# Patient Record
Sex: Female | Born: 1965 | Race: White | Hispanic: No | Smoking: Former smoker
Health system: Southern US, Community
[De-identification: ages and names within clinical notes are randomized; demographics above are authoritative.]

## PROBLEM LIST (undated history)

## (undated) DIAGNOSIS — K279 Peptic ulcer, site unspecified, unspecified as acute or chronic, without hemorrhage or perforation: Secondary | ICD-10-CM

## (undated) DIAGNOSIS — R0600 Dyspnea, unspecified: Secondary | ICD-10-CM

## (undated) DIAGNOSIS — R51 Headache: Secondary | ICD-10-CM

## (undated) DIAGNOSIS — F329 Major depressive disorder, single episode, unspecified: Secondary | ICD-10-CM

## (undated) DIAGNOSIS — R519 Headache, unspecified: Secondary | ICD-10-CM

## (undated) DIAGNOSIS — I2510 Atherosclerotic heart disease of native coronary artery without angina pectoris: Secondary | ICD-10-CM

## (undated) DIAGNOSIS — D649 Anemia, unspecified: Secondary | ICD-10-CM

## (undated) DIAGNOSIS — F32A Depression, unspecified: Secondary | ICD-10-CM

## (undated) DIAGNOSIS — K284 Chronic or unspecified gastrojejunal ulcer with hemorrhage: Secondary | ICD-10-CM

## (undated) DIAGNOSIS — R569 Unspecified convulsions: Secondary | ICD-10-CM

## (undated) DIAGNOSIS — I219 Acute myocardial infarction, unspecified: Secondary | ICD-10-CM

## (undated) DIAGNOSIS — M797 Fibromyalgia: Secondary | ICD-10-CM

## (undated) DIAGNOSIS — K8020 Calculus of gallbladder without cholecystitis without obstruction: Secondary | ICD-10-CM

## (undated) DIAGNOSIS — K219 Gastro-esophageal reflux disease without esophagitis: Secondary | ICD-10-CM

## (undated) DIAGNOSIS — M1990 Unspecified osteoarthritis, unspecified site: Secondary | ICD-10-CM

## (undated) DIAGNOSIS — F419 Anxiety disorder, unspecified: Secondary | ICD-10-CM

## (undated) HISTORY — PX: ESOPHAGOGASTRODUODENOSCOPY: SHX1529

## (undated) HISTORY — PX: ABDOMINAL HYSTERECTOMY: SHX81

## (undated) HISTORY — DX: Peptic ulcer, site unspecified, unspecified as acute or chronic, without hemorrhage or perforation: K27.9

## (undated) HISTORY — PX: COLONOSCOPY: SHX174

## (undated) HISTORY — PX: WRIST FRACTURE SURGERY: SHX121

## (undated) HISTORY — DX: Chronic or unspecified gastrojejunal ulcer with hemorrhage: K28.4

## (undated) HISTORY — DX: Calculus of gallbladder without cholecystitis without obstruction: K80.20

---

## 2005-05-29 ENCOUNTER — Ambulatory Visit (HOSPITAL_COMMUNITY): Admission: RE | Admit: 2005-05-29 | Discharge: 2005-05-29 | Payer: Self-pay | Admitting: Family Medicine

## 2005-12-10 ENCOUNTER — Ambulatory Visit (HOSPITAL_COMMUNITY): Admission: RE | Admit: 2005-12-10 | Discharge: 2005-12-10 | Payer: Self-pay | Admitting: Family Medicine

## 2006-02-20 ENCOUNTER — Inpatient Hospital Stay (HOSPITAL_COMMUNITY): Admission: RE | Admit: 2006-02-20 | Discharge: 2006-02-22 | Payer: Self-pay | Admitting: Obstetrics & Gynecology

## 2006-02-20 ENCOUNTER — Encounter (INDEPENDENT_AMBULATORY_CARE_PROVIDER_SITE_OTHER): Payer: Self-pay | Admitting: *Deleted

## 2007-12-04 ENCOUNTER — Ambulatory Visit (HOSPITAL_COMMUNITY): Admission: RE | Admit: 2007-12-04 | Discharge: 2007-12-04 | Payer: Self-pay | Admitting: Family Medicine

## 2008-12-22 ENCOUNTER — Emergency Department: Payer: Self-pay

## 2010-04-04 ENCOUNTER — Emergency Department: Payer: Self-pay | Admitting: Emergency Medicine

## 2010-12-15 NOTE — Op Note (Signed)
Carolyn Gaines, Carolyn Gaines                 ACCOUNT NO.:  [id number]   MEDICAL RECORD NO.:  [id number]          PATIENT TYPE:  AMB   LOCATION:  DAY                           FACILITY:  [facility]   PHYSICIAN:  Lazaro Arms, M.D.   DATE OF BIRTH:  1966-01-23   DATE OF PROCEDURE:  02/20/2006  DATE OF DISCHARGE:                                 OPERATIVE REPORT   PREOPERATIVE DIAGNOSES:  1.  Degenerating myoma.  2.  Dyspareunia.  3.  Chronic pelvic pain.   POSTOPERATIVE DIAGNOSES:  1.  Degenerating myoma.  2.  Dyspareunia.  3.  Chronic pelvic pain.  4.  Dense adhesions of the uterus to the anterior abdominal wall and      bladder.   PROCEDURE:  Abdominal hysterectomy.   SURGEON:  Lazaro Arms, M.D.   ANESTHESIA:  General endotracheal anesthesia.   FINDINGS:  The entire anterior of the uterine surface up to the fundus was  densely adherent to the anterior abdominal wall and to the bladder.  This is  probably what made it feel like it was a 12-week size uterus even though on  ultrasound it examined normal.  This is probably also why it was tender on  exam.  Probably not necessarily a degenerating myoma but probably tenderness  from this and also I would assume the reason why she is having pain with  intercourse.  There was no endometriosis or any other abnormal findings.  The ovaries were normal bilaterally.   DESCRIPTION OF PROCEDURE:  Patient was taken to the operating room and  placed in the supine position where she underwent general endotracheal  anesthesia.  The vagina was then prepped.  The Foley catheter was placed.  Abdomen was prepped and draped in the usual sterile fashion.  A Pfannenstiel  skin incision was made and carried down sharply to the rectus fascia which  was scored in the midline and extended laterally.  The fascia was taken off  the muscle superiorly and inferiorly without difficulty.  The muscles were  divided.  The peritoneal cavity was entered.  At this point, I  recognize  that the uterus was densely adherent to the anterior abdominal wall all the  way up to the fundus and really through the myometrium.  Sharply and bluntly  the uterus was dissected off the anterior abdominal wall and then took a  great deal of care to dissect the bladder off as well.  The left uterine  round  ligament was suture ligated and cut and the utero-ovarian ligament on the  left was clamped, cut, and doubly suture ligated ...   Dictation ended at this point.      Lazaro Arms, M.D.  Electronically Signed     LHE/MEDQ  D:  02/20/2006  T:  02/20/2006  Job:  [id number]

## 2010-12-15 NOTE — Discharge Summary (Signed)
NAMEENYLA, LISBON                 ACCOUNT NO.:  [id number]   MEDICAL RECORD NO.:  [id number]          PATIENT TYPE:  INP   LOCATION:  A427                          FACILITY:  [facility]   PHYSICIAN:  Lazaro Arms, M.D.   DATE OF BIRTH:  [birth year]   DATE OF ADMISSION:  02/20/2006  DATE OF DISCHARGE:  07/27/2007LH                                 DISCHARGE SUMMARY   DISCHARGE DIAGNOSES:  1.  Status post abdominal hysterectomy.  2.  Unremarkable postoperative course.   PROCEDURE:  Abdominal hysterectomy.   Please refer to the operative report as well as the admission history and  physical for details of the admission to the hospital.   HOSPITAL COURSE:  The patient was admitted postoperatively. She did quite  well, she tolerated clear liquids and a regular diet. She voided without  symptoms. Her hemoglobin and hematocrit postop was 11.3 and 33.4. She  tolerated transition to oral pain medicine. Her incision was clean, dry and  intact. She was extensively ambulatory. She was discharged to home the  morning of postoperative day #2 in good stable condition to followup in the  office next Wednesday as scheduled to have her staples removed. She was  given Tylox and Motrin for pain and instruction precautions for return prior  to that time.      Lazaro Arms, M.D.  Electronically Signed     LHE/MEDQ  D:  02/22/2006  T:  02/22/2006  Job:  [id number]

## 2016-01-12 DIAGNOSIS — Z87891 Personal history of nicotine dependence: Secondary | ICD-10-CM | POA: Diagnosis not present

## 2016-01-12 DIAGNOSIS — N3000 Acute cystitis without hematuria: Secondary | ICD-10-CM | POA: Diagnosis not present

## 2016-01-28 DIAGNOSIS — I219 Acute myocardial infarction, unspecified: Secondary | ICD-10-CM

## 2016-01-28 HISTORY — DX: Acute myocardial infarction, unspecified: I21.9

## 2016-02-19 ENCOUNTER — Inpatient Hospital Stay (HOSPITAL_COMMUNITY)
Admission: EM | Admit: 2016-02-19 | Discharge: 2016-02-21 | DRG: 247 | Disposition: A | Discharge status: Home / Self Care | Payer: BLUE CROSS/BLUE SHIELD | Attending: Cardiology | Admitting: Cardiology

## 2016-02-19 ENCOUNTER — Encounter (HOSPITAL_COMMUNITY)
Admission: EM | Disposition: A | Discharge status: Home / Self Care | Payer: Self-pay | Source: Home / Self Care | Attending: Cardiology

## 2016-02-19 ENCOUNTER — Encounter (HOSPITAL_COMMUNITY): Payer: Self-pay | Admitting: Emergency Medicine

## 2016-02-19 DIAGNOSIS — M797 Fibromyalgia: Secondary | ICD-10-CM | POA: Diagnosis present

## 2016-02-19 DIAGNOSIS — R001 Bradycardia, unspecified: Secondary | ICD-10-CM | POA: Diagnosis not present

## 2016-02-19 DIAGNOSIS — I213 ST elevation (STEMI) myocardial infarction of unspecified site: Secondary | ICD-10-CM

## 2016-02-19 DIAGNOSIS — F419 Anxiety disorder, unspecified: Secondary | ICD-10-CM | POA: Diagnosis present

## 2016-02-19 DIAGNOSIS — F17200 Nicotine dependence, unspecified, uncomplicated: Secondary | ICD-10-CM | POA: Diagnosis present

## 2016-02-19 DIAGNOSIS — Z955 Presence of coronary angioplasty implant and graft: Secondary | ICD-10-CM

## 2016-02-19 DIAGNOSIS — R200 Anesthesia of skin: Secondary | ICD-10-CM | POA: Diagnosis not present

## 2016-02-19 DIAGNOSIS — Z79899 Other long term (current) drug therapy: Secondary | ICD-10-CM | POA: Diagnosis not present

## 2016-02-19 DIAGNOSIS — Z91048 Other nonmedicinal substance allergy status: Secondary | ICD-10-CM

## 2016-02-19 DIAGNOSIS — R079 Chest pain, unspecified: Secondary | ICD-10-CM | POA: Diagnosis not present

## 2016-02-19 DIAGNOSIS — Z888 Allergy status to other drugs, medicaments and biological substances status: Secondary | ICD-10-CM | POA: Diagnosis not present

## 2016-02-19 DIAGNOSIS — E785 Hyperlipidemia, unspecified: Secondary | ICD-10-CM | POA: Diagnosis present

## 2016-02-19 DIAGNOSIS — I2119 ST elevation (STEMI) myocardial infarction involving other coronary artery of inferior wall: Secondary | ICD-10-CM | POA: Diagnosis not present

## 2016-02-19 DIAGNOSIS — I959 Hypotension, unspecified: Secondary | ICD-10-CM | POA: Diagnosis not present

## 2016-02-19 DIAGNOSIS — I2510 Atherosclerotic heart disease of native coronary artery without angina pectoris: Secondary | ICD-10-CM | POA: Clinically undetermined

## 2016-02-19 DIAGNOSIS — I252 Old myocardial infarction: Secondary | ICD-10-CM | POA: Diagnosis present

## 2016-02-19 DIAGNOSIS — I2111 ST elevation (STEMI) myocardial infarction involving right coronary artery: Principal | ICD-10-CM | POA: Diagnosis present

## 2016-02-19 DIAGNOSIS — Z91030 Bee allergy status: Secondary | ICD-10-CM | POA: Diagnosis not present

## 2016-02-19 HISTORY — PX: CARDIAC CATHETERIZATION: SHX172

## 2016-02-19 HISTORY — DX: Unspecified osteoarthritis, unspecified site: M19.90

## 2016-02-19 HISTORY — DX: Fibromyalgia: M79.7

## 2016-02-19 HISTORY — DX: Anxiety disorder, unspecified: F41.9

## 2016-02-19 LAB — COMPREHENSIVE METABOLIC PANEL
ALBUMIN: 3.3 g/dL — AB (ref 3.5–5.0)
ALK PHOS: 69 U/L (ref 38–126)
ALT: 14 U/L (ref 14–54)
AST: 24 U/L (ref 15–41)
Anion gap: 6 (ref 5–15)
BILIRUBIN TOTAL: 0.3 mg/dL (ref 0.3–1.2)
BUN: 12 mg/dL (ref 6–20)
CALCIUM: 8.2 mg/dL — AB (ref 8.9–10.3)
CO2: 21 mmol/L — ABNORMAL LOW (ref 22–32)
Chloride: 109 mmol/L (ref 101–111)
Creatinine, Ser: 0.8 mg/dL (ref 0.44–1.00)
GFR calc Af Amer: >60 mL/min (ref >60)
GFR calc non Af Amer: >60 mL/min (ref >60)
Glucose, Bld: 129 mg/dL — ABNORMAL HIGH (ref 65–99)
Potassium: 3 mmol/L — ABNORMAL LOW (ref 3.5–5.1)
Sodium: 136 mmol/L (ref 135–145)
TOTAL PROTEIN: 6.5 g/dL (ref 6.5–8.1)

## 2016-02-19 LAB — DIFFERENTIAL
Basophils Absolute: 0 10*3/uL (ref 0.0–0.1)
Basophils Relative: 0 %
Eosinophils Absolute: 0.2 10*3/uL (ref 0.0–0.7)
Eosinophils Relative: 2 %
LYMPHS PCT: 23 %
Lymphs Abs: 1.7 10*3/uL (ref 0.7–4.0)
MONO ABS: 0.5 10*3/uL (ref 0.1–1.0)
Monocytes Relative: 7 %
Neutro Abs: 5 10*3/uL (ref 1.7–7.7)
Neutrophils Relative %: 68 %

## 2016-02-19 LAB — CBC
HCT: 38.6 % (ref 36.0–46.0)
Hemoglobin: 12.8 g/dL (ref 12.0–15.0)
MCH: 29.7 pg (ref 26.0–34.0)
MCHC: 33.2 g/dL (ref 30.0–36.0)
MCV: 89.6 fL (ref 78.0–100.0)
Platelets: 182 10*3/uL (ref 150–400)
RBC: 4.31 MIL/uL (ref 3.87–5.11)
RDW: 13.2 % (ref 11.5–15.5)
WBC: 7.3 10*3/uL (ref 4.0–10.5)

## 2016-02-19 LAB — POCT I-STAT, CHEM 8
BUN: 15 mg/dL (ref 6–20)
BUN: 12 mg/dL (ref 6–20)
CHLORIDE: 111 mmol/L (ref 101–111)
Calcium, Ion: 0.99 mmol/L — ABNORMAL LOW (ref 1.13–1.30)
Calcium, Ion: 1.11 mmol/L — ABNORMAL LOW (ref 1.13–1.30)
Chloride: 117 mmol/L — ABNORMAL HIGH (ref 101–111)
Creatinine, Ser: 0.8 mg/dL (ref 0.44–1.00)
Creatinine, Ser: 0.7 mg/dL (ref 0.44–1.00)
GLUCOSE: 122 mg/dL — AB (ref 65–99)
Glucose, Bld: 125 mg/dL — ABNORMAL HIGH (ref 65–99)
HEMATOCRIT: 39 % (ref 36.0–46.0)
HEMATOCRIT: 32 % — AB (ref 36.0–46.0)
HEMOGLOBIN: 13.3 g/dL (ref 12.0–15.0)
HEMOGLOBIN: 10.9 g/dL — AB (ref 12.0–15.0)
POTASSIUM: 3.9 mmol/L (ref 3.5–5.1)
POTASSIUM: 3.1 mmol/L — AB (ref 3.5–5.1)
SODIUM: 138 mmol/L (ref 135–145)
Sodium: 143 mmol/L (ref 135–145)
TCO2: 20 mmol/L (ref 0–100)
TCO2: 25 mmol/L (ref 0–100)

## 2016-02-19 LAB — LIPID PANEL
Cholesterol: 162 mg/dL (ref 0–200)
HDL: 33 mg/dL — ABNORMAL LOW (ref >40)
LDL Cholesterol: 115 mg/dL — ABNORMAL HIGH (ref 0–99)
Total CHOL/HDL Ratio: 4.9 RATIO
Triglycerides: 71 mg/dL (ref <150)
VLDL: 14 mg/dL (ref 0–40)

## 2016-02-19 LAB — TROPONIN I: Troponin I: 0.12 ng/mL (ref <0.03)

## 2016-02-19 LAB — PROTIME-INR
INR: 1.35 (ref 0.00–1.49)
Prothrombin Time: 16.8 seconds — ABNORMAL HIGH (ref 11.6–15.2)

## 2016-02-19 LAB — POCT I-STAT TROPONIN I: TROPONIN I, POC: 0.03 ng/mL (ref 0.00–0.08)

## 2016-02-19 LAB — APTT: aPTT: >200 seconds (ref 24–37)

## 2016-02-19 SURGERY — LEFT HEART CATH AND CORONARY ANGIOGRAPHY
Anesthesia: LOCAL

## 2016-02-19 MED ORDER — TICAGRELOR 90 MG PO TABS
ORAL_TABLET | ORAL | Status: AC
  Filled 2016-02-19: qty 2

## 2016-02-19 MED ORDER — TRAZODONE HCL 50 MG PO TABS
150.0000 mg | ORAL_TABLET | Freq: Every day | ORAL | Status: DC
  Administered 2016-02-19 – 2016-02-20 (×2): 150 mg via ORAL
  Filled 2016-02-19: qty 1
  Filled 2016-02-19: qty 3

## 2016-02-19 MED ORDER — SODIUM CHLORIDE 0.9 % IV SOLN
INTRAVENOUS | Status: AC
  Administered 2016-02-19: 21:00:00 via INTRAVENOUS

## 2016-02-19 MED ORDER — SODIUM CHLORIDE 0.9% FLUSH
3.0000 mL | Freq: Two times a day (BID) | INTRAVENOUS | Status: DC
  Administered 2016-02-20 (×3): 3 mL via INTRAVENOUS

## 2016-02-19 MED ORDER — LIDOCAINE HCL (PF) 1 % IJ SOLN
INTRAMUSCULAR | Status: DC | PRN
  Administered 2016-02-19: 2 mL via INTRADERMAL

## 2016-02-19 MED ORDER — IOPAMIDOL (ISOVUE-370) INJECTION 76%
INTRAVENOUS | Status: AC
  Filled 2016-02-19: qty 125

## 2016-02-19 MED ORDER — DIAZEPAM 5 MG PO TABS
5.0000 mg | ORAL_TABLET | ORAL | Status: DC | PRN
  Administered 2016-02-19: 5 mg via ORAL
  Filled 2016-02-19: qty 1

## 2016-02-19 MED ORDER — ATORVASTATIN CALCIUM 80 MG PO TABS
80.0000 mg | ORAL_TABLET | Freq: Every day | ORAL | Status: DC
Start: 2016-02-20 — End: 2016-02-21
  Administered 2016-02-20: 80 mg via ORAL
  Filled 2016-02-19: qty 1

## 2016-02-19 MED ORDER — METOPROLOL TARTRATE 12.5 MG HALF TABLET
12.5000 mg | ORAL_TABLET | Freq: Two times a day (BID) | ORAL | Status: DC
  Administered 2016-02-19: 12.5 mg via ORAL
  Filled 2016-02-19: qty 1

## 2016-02-19 MED ORDER — LIDOCAINE HCL (PF) 1 % IJ SOLN
INTRAMUSCULAR | Status: AC
  Filled 2016-02-19: qty 30

## 2016-02-19 MED ORDER — ALPRAZOLAM 0.5 MG PO TABS
1.0000 mg | ORAL_TABLET | Freq: Two times a day (BID) | ORAL | Status: DC | PRN
  Administered 2016-02-20 – 2016-02-21 (×2): 1 mg via ORAL
  Filled 2016-02-19 (×2): qty 2

## 2016-02-19 MED ORDER — TRAMADOL HCL 50 MG PO TABS
100.0000 mg | ORAL_TABLET | Freq: Four times a day (QID) | ORAL | Status: DC | PRN
  Administered 2016-02-19 – 2016-02-21 (×5): 100 mg via ORAL
  Filled 2016-02-19 (×5): qty 2

## 2016-02-19 MED ORDER — HEPARIN SODIUM (PORCINE) 5000 UNIT/ML IJ SOLN
INTRAMUSCULAR | Status: AC
  Administered 2016-02-19: 4000 [IU]
  Filled 2016-02-19: qty 1

## 2016-02-19 MED ORDER — IOPAMIDOL (ISOVUE-370) INJECTION 76%
INTRAVENOUS | Status: DC | PRN
  Administered 2016-02-19: 110 mL via INTRAVENOUS

## 2016-02-19 MED ORDER — SODIUM CHLORIDE 0.9 % IV BOLUS (SEPSIS)
500.0000 mL | Freq: Once | INTRAVENOUS | Status: AC
  Administered 2016-02-19: 500 mL via INTRAVENOUS

## 2016-02-19 MED ORDER — HYDROMORPHONE HCL 1 MG/ML IJ SOLN
INTRAMUSCULAR | Status: AC
  Filled 2016-02-19: qty 1

## 2016-02-19 MED ORDER — FENTANYL CITRATE (PF) 100 MCG/2ML IJ SOLN: 50.0000 ug | Freq: Once | INTRAMUSCULAR | Status: DC

## 2016-02-19 MED ORDER — SODIUM CHLORIDE 0.9% FLUSH: 3.0000 mL | INTRAVENOUS | Status: DC | PRN

## 2016-02-19 MED ORDER — NITROGLYCERIN 1 MG/10 ML FOR IR/CATH LAB
INTRA_ARTERIAL | Status: DC | PRN
  Administered 2016-02-19: 200 ug via INTRACORONARY

## 2016-02-19 MED ORDER — HEPARIN (PORCINE) IN NACL 2-0.9 UNIT/ML-% IJ SOLN
INTRAMUSCULAR | Status: AC
  Filled 2016-02-19: qty 1500

## 2016-02-19 MED ORDER — NITROGLYCERIN 1 MG/10 ML FOR IR/CATH LAB
INTRA_ARTERIAL | Status: AC
  Filled 2016-02-19: qty 10

## 2016-02-19 MED ORDER — ACETAMINOPHEN 325 MG PO TABS
650.0000 mg | ORAL_TABLET | ORAL | Status: DC | PRN
  Administered 2016-02-20 (×2): 650 mg via ORAL
  Filled 2016-02-19 (×2): qty 2

## 2016-02-19 MED ORDER — HEPARIN SODIUM (PORCINE) 1000 UNIT/ML IJ SOLN
INTRAMUSCULAR | Status: AC
  Filled 2016-02-19: qty 1

## 2016-02-19 MED ORDER — VERAPAMIL HCL 2.5 MG/ML IV SOLN
INTRA_ARTERIAL | Status: DC | PRN
  Administered 2016-02-19: 19:00:00 via INTRA_ARTERIAL

## 2016-02-19 MED ORDER — MIDAZOLAM HCL 2 MG/2ML IJ SOLN
INTRAMUSCULAR | Status: DC | PRN
  Administered 2016-02-19: 2 mg via INTRAVENOUS

## 2016-02-19 MED ORDER — ASPIRIN EC 81 MG PO TBEC
81.0000 mg | DELAYED_RELEASE_TABLET | Freq: Every day | ORAL | Status: DC
  Administered 2016-02-20 – 2016-02-21 (×2): 81 mg via ORAL
  Filled 2016-02-19 (×2): qty 1

## 2016-02-19 MED ORDER — MIDAZOLAM HCL 2 MG/2ML IJ SOLN
INTRAMUSCULAR | Status: AC
  Filled 2016-02-19: qty 2

## 2016-02-19 MED ORDER — SODIUM CHLORIDE 0.9 % IV SOLN: 250.0000 mL | INTRAVENOUS | Status: DC | PRN

## 2016-02-19 MED ORDER — VERAPAMIL HCL 2.5 MG/ML IV SOLN
INTRAVENOUS | Status: AC
  Filled 2016-02-19: qty 2

## 2016-02-19 MED ORDER — TICAGRELOR 90 MG PO TABS
90.0000 mg | ORAL_TABLET | Freq: Two times a day (BID) | ORAL | Status: DC
  Administered 2016-02-19 – 2016-02-21 (×4): 90 mg via ORAL
  Filled 2016-02-19 (×4): qty 1

## 2016-02-19 MED ORDER — HEPARIN SODIUM (PORCINE) 1000 UNIT/ML IJ SOLN
INTRAMUSCULAR | Status: DC | PRN
  Administered 2016-02-19: 8000 [IU] via INTRAVENOUS
  Administered 2016-02-19: 1500 [IU] via INTRAVENOUS

## 2016-02-19 MED ORDER — SODIUM CHLORIDE 0.9 % IV SOLN
10.0000 mL/h | INTRAVENOUS | Status: DC
  Administered 2016-02-19: 20 mL/h via INTRAVENOUS
  Administered 2016-02-19: 250 mL via INTRAVENOUS

## 2016-02-19 MED ORDER — ONDANSETRON HCL 4 MG/2ML IJ SOLN: 4.0000 mg | Freq: Four times a day (QID) | INTRAMUSCULAR | Status: DC | PRN

## 2016-02-19 MED ORDER — HEPARIN SODIUM (PORCINE) 5000 UNIT/ML IJ SOLN: 60.0000 [IU]/kg | INTRAMUSCULAR | Status: DC

## 2016-02-19 MED ORDER — NITROGLYCERIN 0.4 MG SL SUBL: 0.4000 mg | SUBLINGUAL_TABLET | SUBLINGUAL | Status: DC | PRN

## 2016-02-19 MED ORDER — FENTANYL CITRATE (PF) 100 MCG/2ML IJ SOLN: INTRAMUSCULAR | Status: DC | PRN

## 2016-02-19 MED ORDER — HYDROMORPHONE HCL 1 MG/ML IJ SOLN
INTRAMUSCULAR | Status: DC | PRN
  Administered 2016-02-19: 0.5 mg via INTRAVENOUS

## 2016-02-19 SURGICAL SUPPLY — 14 items
BALLN TREK RX 2.5X12 (BALLOONS) ×2
BALLOON TREK RX 2.5X12 (BALLOONS) IMPLANT
CATH OPTITORQUE TIG 4.0 5F (CATHETERS) ×1 IMPLANT
CATH VISTA GUIDE 6FR JR4 (CATHETERS) ×1 IMPLANT
DEVICE RAD COMP TR BAND LRG (VASCULAR PRODUCTS) ×1 IMPLANT
GLIDESHEATH SLEND A-KIT 6F 20G (SHEATH) ×1 IMPLANT
KIT ENCORE 26 ADVANTAGE (KITS) ×2 IMPLANT
KIT HEART LEFT (KITS) ×2 IMPLANT
PACK CARDIAC CATHETERIZATION (CUSTOM PROCEDURE TRAY) ×2 IMPLANT
STENT RESOLUTE INTEG 3.0X30 (Permanent Stent) ×1 IMPLANT
TRANSDUCER W/STOPCOCK (MISCELLANEOUS) ×2 IMPLANT
TUBING CIL FLEX 10 FLL-RA (TUBING) ×2 IMPLANT
WIRE ASAHI PROWATER 180CM (WIRE) ×1 IMPLANT
WIRE SAFE-T 1.5MM-J .035X260CM (WIRE) ×2 IMPLANT

## 2016-02-19 NOTE — ED Triage Notes (Addendum)
Report from [facility]> pt was found lying in bed with substernal chest pain since 5pm.  States pain radiated to L arm.  Denies sob, nausea, and vomiting.  CODE STEMI activated pta.  Dr. Jodi Mourning at bedside on pt arrival.

## 2016-02-19 NOTE — ED Provider Notes (Signed)
[facility]-EMERGENCY DEPT Provider Note   CSN: [id number] Arrival date & time: 02/19/16  1857  First Provider Contact:  First MD Initiated Contact with Patient 02/19/16 1901        History   Chief Complaint Chief Complaint  Patient presents with  . Code STEMI    HPI Carolyn Gaines is a 50 y.o. female.  The history is provided by the patient and the EMS personnel.  Chest Pain   This is a new problem. The current episode started less than 1 hour ago. The problem occurs constantly. The problem has not changed since onset.The pain is associated with exertion. The pain is present in the substernal region. The pain is at a severity of 7/10. The pain is severe. The quality of the pain is described as pressure-like. The symptoms are aggravated by exertion. Associated symptoms include diaphoresis, exertional chest pressure, malaise/fatigue, nausea, palpitations, shortness of breath and weakness. Pertinent negatives include no abdominal pain, no back pain, no cough, no dizziness, no fever, no headaches, no hemoptysis, no irregular heartbeat, no lower extremity edema, no near-syncope, no syncope and no vomiting. She has tried nothing for the symptoms. The treatment provided mild relief. Risk factors include stress and smoking/tobacco exposure.    Past Medical History:  Diagnosis Date  . Anxiety   . Arthritis   . Fibromyalgia     Patient Active Problem List   Diagnosis Date Noted  . Acute MI, inferior wall, initial episode of care (HCC) 02/19/2016  . CAD (coronary artery disease), native coronary artery 02/19/2016    Past Surgical History:  Procedure Laterality Date  . ABDOMINAL HYSTERECTOMY      OB History    No data available       Home Medications    Prior to Admission medications   Medication Sig Start Date End Date Taking? Authorizing Provider  aspirin-acetaminophen-caffeine (EXCEDRIN MIGRAINE) [phone] MG tablet Take 2 tablets by mouth 3 (three) times daily as needed for  migraine.   Yes Historical Provider, MD  fluocinonide-emollient (LIDEX-E) 0.05 % cream Apply 1 application topically 2 (two) times daily as needed (psoriasis).   Yes Historical Provider, MD  traMADol (ULTRAM) 50 MG tablet Take 100-150 mg by mouth every 4 (four) hours as needed (pain).   Yes Historical Provider, MD  traZODone (DESYREL) 150 MG tablet Take 150 mg by mouth at bedtime.   Yes Historical Provider, MD    Family History No family history on file.  Social History Social History  Substance Use Topics  . Smoking status: Current Some Day Smoker  . Smokeless tobacco: Never Used  . Alcohol use No     Allergies   Adhesive [tape]; Bee venom; and Triple antibiotic [bacitracin-neomycin-polymyxin]   Review of Systems Review of Systems  Constitutional: Positive for diaphoresis and malaise/fatigue. Negative for fever.  HENT: Negative for congestion.   Eyes: Negative for visual disturbance.  Respiratory: Positive for shortness of breath. Negative for cough and hemoptysis.   Cardiovascular: Positive for chest pain and palpitations. Negative for syncope and near-syncope.  Gastrointestinal: Positive for nausea. Negative for abdominal pain and vomiting.  Genitourinary: Negative for difficulty urinating and dysuria.  Musculoskeletal: Negative for back pain.  Skin: Negative for rash and wound.  Neurological: Positive for weakness. Negative for dizziness and headaches.  Psychiatric/Behavioral: Negative for agitation and confusion.     Physical Exam Updated Vital Signs BP (!) 81/49   Pulse (!) 58   Temp 98.4 F (36.9 C) (Oral)   Resp 16  Ht 5\' 4"  (1.626 m)   Wt 79.6 kg   SpO2 96%   BMI 30.12 kg/m   Physical Exam  Constitutional: She appears well-developed and well-nourished. No distress.  HENT:  Head: Normocephalic and atraumatic.  Eyes: Conjunctivae are normal.  Neck: Neck supple.  Cardiovascular: Normal rate and regular rhythm.   No murmur heard. Pulmonary/Chest:  Effort normal and breath sounds normal. No respiratory distress.  Abdominal: Soft. There is no tenderness.  Musculoskeletal: She exhibits no edema.  Neurological: She is alert.  Skin: Skin is warm and dry. She is not diaphoretic.  Psychiatric: She has a normal mood and affect. Her behavior is normal.  Nursing note and vitals reviewed.    ED Treatments / Results  Labs (all labs ordered are listed, but only abnormal results are displayed) Labs Reviewed  PROTIME-INR - Abnormal; Notable for the following:       Result Value   Prothrombin Time 16.8 (*)    All other components within normal limits  APTT - Abnormal; Notable for the following:    aPTT >200 (*)    All other components within normal limits  COMPREHENSIVE METABOLIC PANEL - Abnormal; Notable for the following:    Potassium 3.0 (*)    CO2 21 (*)    Glucose, Bld 129 (*)    Calcium 8.2 (*)    Albumin 3.3 (*)    All other components within normal limits  TROPONIN I - Abnormal; Notable for the following:    Troponin I 0.12 (*)    All other components within normal limits  LIPID PANEL - Abnormal; Notable for the following:    HDL 33 (*)    LDL Cholesterol 115 (*)    All other components within normal limits  POCT I-STAT, CHEM 8 - Abnormal; Notable for the following:    Glucose, Bld 122 (*)    Calcium, Ion 0.99 (*)    All other components within normal limits  POCT I-STAT, CHEM 8 - Abnormal; Notable for the following:    Potassium 3.1 (*)    Chloride 117 (*)    Glucose, Bld 125 (*)    Calcium, Ion 1.11 (*)    Hemoglobin 10.9 (*)    HCT 32.0 (*)    All other components within normal limits  MRSA PCR SCREENING  CBC  DIFFERENTIAL  BASIC METABOLIC PANEL  CBC  TROPONIN I  TROPONIN I  TSH  TROPONIN I  TROPONIN I  POCT I-STAT TROPONIN I    EKG  EKG Interpretation  Date/Time:  Sunday February 19 2016 19:04:28 EDT Ventricular Rate:  59 PR Interval:    QRS Duration: 62 QT Interval:  378 QTC Calculation: 375 R  Axis:   80 Text Interpretation:  Incomplete analysis due to missing data in precordial lead(s) Sinus rhythm Inferior infarct, acute (RCA) Probably posterior infarct, acute Probable RV involvement, suggest recording right precordial leads Acute Confirmed by Jacinto Halim  MD, JAGADEESH ([id number]) on 02/19/2016 8:07:47 PM       Radiology No results found.  Procedures Procedures (including critical care time)  Medications Ordered in ED Medications  0.9 %  sodium chloride infusion (250 mLs Intravenous Bolus from Bag 02/19/16 1943)  fentaNYL (SUBLIMAZE) injection 50 mcg ( Intravenous MAR Unhold 02/19/16 2027)  sodium chloride flush (NS) 0.9 % injection 3 mL (3 mLs Intravenous Given 02/20/16 0030)  sodium chloride flush (NS) 0.9 % injection 3 mL (not administered)  0.9 %  sodium chloride infusion (not administered)  acetaminophen (TYLENOL) tablet  650 mg (not administered)  ondansetron (ZOFRAN) injection 4 mg (not administered)  aspirin EC tablet 81 mg (not administered)  nitroGLYCERIN (NITROSTAT) SL tablet 0.4 mg (not administered)  0.9 %  sodium chloride infusion ( Intravenous New Bag/Given 02/19/16 2100)  diazepam (VALIUM) tablet 5 mg (5 mg Oral Given 02/19/16 2122)  ticagrelor (BRILINTA) tablet 90 mg (90 mg Oral Given 02/19/16 2122)  ALPRAZolam (XANAX) tablet 1 mg (not administered)  atorvastatin (LIPITOR) tablet 80 mg (not administered)  traZODone (DESYREL) tablet 150 mg (150 mg Oral Given 02/19/16 2148)  traMADol (ULTRAM) tablet 100 mg (100 mg Oral Given 02/19/16 2148)  heparin 5000 UNIT/ML injection (4,000 Units  Given 02/19/16 1903)  sodium chloride 0.9 % bolus 500 mL (500 mLs Intravenous Given 02/19/16 2245)     Initial Impression / Assessment and Plan / ED Course  I have reviewed the triage vital signs and the nursing notes.  Pertinent labs & imaging results that were available during my care of the patient were reviewed by me and considered in my medical decision making (see chart for  details).  Clinical Course   Patient presented to the emergency department with acute inferior MI. En route she was given one nitroglycerin prior to discovery of the inferior MI. She took a full dose aspirin at home at the direction of 911 triage. EMS EKG demonstrates ST elevation in leads 2, 3, and aVF with reciprocal anterior changes. EKG in the emergency department demonstrates resolution of ST elevation. She was started on normal saline and on arrival her pain had improved. Vitals remained within normal limits. She was given a heparin bolus and was taken to the cath lab emergently from the emergency department. Final Clinical Impressions(s) / ED Diagnoses   Final diagnoses:  None    New Prescriptions Current Discharge Medication List       Levora Angel, MD 02/20/16 0225    Blane Ohara, MD 02/28/16 1610

## 2016-02-19 NOTE — ED Notes (Signed)
Transported to cath lab with Lawson Fiscal, RN and Darrel, EMT

## 2016-02-19 NOTE — H&P (Signed)
Carolyn Gaines is an 50 y.o. female.   Chief Complaint: Chest pain HPI: Carolyn Gaines  is a 50 y.o. female  With h/o hyperlipidemia and fibromyalgia and smokes occasionally admitted with chest pain that started this evening, waxing and waning, felt very uncomfortable, as she was not feeling better, activated the CMS, was found to have ST segment elevation in the inferior leads. STEMI was activated and patient directly transferred to [facility] for evaluation.  Patient continues to have chest discomfort, describes as 7-8 out of 10 in intensity associated with radiation to her. She feels clammy, markedly short of breath. No palpitations, no leg edema, no recent travel.  Past Medical History:  Diagnosis Date  . Anxiety   . Arthritis   . Fibromyalgia     Past Surgical History:  Procedure Laterality Date  . ABDOMINAL HYSTERECTOMY      Family history: There is no history of premature coronary artery disease in the family. No history of diabetes. Social History:  reports that she has been smoking.  She has never used smokeless tobacco. She reports that she does not drink alcohol or use drugs. Smokes about 2-3 cigarettes a day sometimes none.  Allergies:  Allergies  Allergen Reactions  . Triple Antibiotic [Bacitracin-Neomycin-Polymyxin] Rash    Review of Systems - Negative except Diffuse joint aches and muscle aches due to fibromyalgia, chronic. Shortness of breath and chest pain. Other systems negative. Not a diabetic. NoDeficits.  Blood pressure 118/74, resp. rate 22, SpO2 99 %. General appearance: alert, cooperative, appears stated age, mild distress and appears to be in pain Eyes: negative findings: lids and lashes normal Neck: no adenopathy, no carotid bruit, no JVD, supple, symmetrical, trachea midline and thyroid not enlarged, symmetric, no tenderness/mass/nodules Neck: JVP - normal, carotids 2+= without bruits Resp: Faint bibasilar crackles heard. Normal breath  sounds. Chest wall: no tenderness Cardio: regular rate and rhythm, S1, S2 normal, no murmur, click, rub or gallop GI: soft, non-tender; bowel sounds normal; no masses,  no organomegaly Extremities: extremities normal, atraumatic, no cyanosis or edema Pulses: 2+ and symmetric Skin: Skin color, texture, turgor normal. No rashes or lesions Neurologic: Grossly normal  Results for orders placed or performed during the hospital encounter of 02/19/16 (from the past 48 hour(s))  POCT i-Stat troponin I     Status: None   Collection Time: 02/19/16  7:08 PM  Result Value Ref Range   Troponin i, poc 0.03 0.00 - 0.08 ng/mL   Comment 3            Comment: Due to the release kinetics of cTnI, a negative result within the first hours of the onset of symptoms does not rule out myocardial infarction with certainty. If myocardial infarction is still suspected, repeat the test at appropriate intervals.   I-STAT, chem 8     Status: Abnormal   Collection Time: 02/19/16  7:10 PM  Result Value Ref Range   Sodium 143 135 - 145 mmol/L   Potassium 3.9 3.5 - 5.1 mmol/L   Chloride 111 101 - 111 mmol/L   BUN 15 6 - 20 mg/dL   Creatinine, Ser 0.76 0.44 - 1.00 mg/dL   Glucose, Bld 226 (H) 65 - 99 mg/dL   Calcium, Ion 3.33 (L) 1.13 - 1.30 mmol/L   TCO2 20 0 - 100 mmol/L   Hemoglobin 13.3 12.0 - 15.0 g/dL   HCT 54.5 62.5 - 63.8 %  CBC     Status: None   Collection  Time: 02/19/16  7:17 PM  Result Value Ref Range   WBC 7.3 4.0 - 10.5 K/uL   RBC 4.31 3.87 - 5.11 MIL/uL   Hemoglobin 12.8 12.0 - 15.0 g/dL   HCT 16.1 09.6 - 04.5 %   MCV 89.6 78.0 - 100.0 fL   MCH 29.7 26.0 - 34.0 pg   MCHC 33.2 30.0 - 36.0 g/dL   RDW 40.9 81.1 - 91.4 %   Platelets 182 150 - 400 K/uL  Differential     Status: None   Collection Time: 02/19/16  7:17 PM  Result Value Ref Range   Neutrophils Relative % 68 %   Neutro Abs 5.0 1.7 - 7.7 K/uL   Lymphocytes Relative 23 %   Lymphs Abs 1.7 0.7 - 4.0 K/uL   Monocytes Relative 7 %    Monocytes Absolute 0.5 0.1 - 1.0 K/uL   Eosinophils Relative 2 %   Eosinophils Absolute 0.2 0.0 - 0.7 K/uL   Basophils Relative 0 %   Basophils Absolute 0.0 0.0 - 0.1 K/uL  I-STAT, chem 8     Status: Abnormal   Collection Time: 02/19/16  7:44 PM  Result Value Ref Range   Sodium 138 135 - 145 mmol/L   Potassium 3.1 (L) 3.5 - 5.1 mmol/L   Chloride 117 (H) 101 - 111 mmol/L   BUN 12 6 - 20 mg/dL   Creatinine, Ser 7.82 0.44 - 1.00 mg/dL   Glucose, Bld 956 (H) 65 - 99 mg/dL   Calcium, Ion 2.13 (L) 1.13 - 1.30 mmol/L   TCO2 25 0 - 100 mmol/L   Hemoglobin 10.9 (L) 12.0 - 15.0 g/dL   HCT 08.6 (L) 57.8 - 46.9 %   No results found.  Labs:   Lab Results  Component Value Date   WBC 7.3 02/19/2016   HGB 10.9 (L) 02/19/2016   HCT 32.0 (L) 02/19/2016   MCV 89.6 02/19/2016   PLT 182 02/19/2016    Recent Labs Lab 02/19/16 1944  NA 138  K 3.1*  CL 117*  BUN 12  CREATININE 0.70  GLUCOSE 125*   EKG: 02/16/2016: Sinus pericardia at the rate of 57 bpm, ST segment elevation in the inferior leads with ST depression in the anterior leads suggestive of acute inferior injury pattern, probable posterior infarct acute. Lateral ischemia versus reciprocal changes. STEMI  Current Facility-Administered Medications:  .  0.9 %  sodium chloride infusion, 10-20 mL/hr, Intravenous, Continuous, Blane Ohara, MD, Last Rate: 999 mL/hr at 02/19/16 1943, 250 mL at 02/19/16 1943 .  [MAR Hold] fentaNYL (SUBLIMAZE) injection 50 mcg, 50 mcg, Intravenous, Once, Levora Angel, MD .  fentaNYL (SUBLIMAZE) injection, , , PRN, Yates Decamp, MD, 25 mcg at 02/19/16 1920 .  [MAR Hold] heparin injection 60 Units/kg, 60 Units/kg, Intravenous, STAT, Blane Ohara, MD .  heparin injection, , , PRN, Yates Decamp, MD, 1,500 Units at 02/19/16 1938 .  lidocaine (PF) (XYLOCAINE) 1 % injection, , , PRN, Yates Decamp, MD, 2 mL at 02/19/16 1921 .  midazolam (VERSED) injection, , , PRN, Yates Decamp, MD, 2 mg at 02/19/16 1920 .  nitroGLYCERIN  1 mg/10 ml (100 mcg/ml) - IR/CATH LAB, , , PRN, Yates Decamp, MD, 200 mcg at 02/19/16 1941  Assessment/Plan 1. Acute inferior and posterior infarct. 2. Hyperlipidemia 3. Occasional tobacco use.  Rec: Proceed emergently with coronary angiogram. Risks benefits discussed, patient is agreeable.   Yates Decamp, MD 02/19/2016, 8:02 PM [facility] Pager: [phone] Office: [phone] If no answer: Cell:  [phone]    

## 2016-02-19 NOTE — Progress Notes (Signed)
Dr. Nadara Eaton called and new orders received and noted. RN will continue to monitor.

## 2016-02-20 ENCOUNTER — Encounter (HOSPITAL_COMMUNITY): Payer: Self-pay | Admitting: Cardiology

## 2016-02-20 LAB — CBC
HEMATOCRIT: 32.9 % — AB (ref 36.0–46.0)
HEMOGLOBIN: 10.5 g/dL — AB (ref 12.0–15.0)
MCH: 28.9 pg (ref 26.0–34.0)
MCHC: 31.9 g/dL (ref 30.0–36.0)
MCV: 90.6 fL (ref 78.0–100.0)
Platelets: 179 10*3/uL (ref 150–400)
RBC: 3.63 MIL/uL — ABNORMAL LOW (ref 3.87–5.11)
RDW: 13.4 % (ref 11.5–15.5)
WBC: 5.3 10*3/uL (ref 4.0–10.5)

## 2016-02-20 LAB — BASIC METABOLIC PANEL
ANION GAP: 3 — AB (ref 5–15)
BUN: 10 mg/dL (ref 6–20)
CO2: 23 mmol/L (ref 22–32)
Calcium: 8.2 mg/dL — ABNORMAL LOW (ref 8.9–10.3)
Chloride: 114 mmol/L — ABNORMAL HIGH (ref 101–111)
Creatinine, Ser: 0.82 mg/dL (ref 0.44–1.00)
GFR calc Af Amer: >60 mL/min (ref >60)
GFR calc non Af Amer: >60 mL/min (ref >60)
GLUCOSE: 114 mg/dL — AB (ref 65–99)
POTASSIUM: 4 mmol/L (ref 3.5–5.1)
Sodium: 140 mmol/L (ref 135–145)

## 2016-02-20 LAB — TROPONIN I
TROPONIN I: 8.17 ng/mL — AB (ref <0.03)
TROPONIN I: 5.11 ng/mL — AB (ref <0.03)
TROPONIN I: 3.25 ng/mL — AB (ref <0.03)
Troponin I: 13.95 ng/mL (ref <0.03)

## 2016-02-20 LAB — TSH: TSH: 0.905 u[IU]/mL (ref 0.350–4.500)

## 2016-02-20 LAB — POCT ACTIVATED CLOTTING TIME: Activated Clotting Time: 246 seconds

## 2016-02-20 LAB — MRSA PCR SCREENING: MRSA by PCR: NEGATIVE

## 2016-02-20 MED ORDER — TICAGRELOR 90 MG PO TABS: 90.0000 mg | ORAL_TABLET | Freq: Two times a day (BID) | ORAL | 1 refills | Status: DC

## 2016-02-20 MED ORDER — NITROGLYCERIN 0.4 MG SL SUBL: 0.4000 mg | SUBLINGUAL_TABLET | SUBLINGUAL | 1 refills | Status: DC | PRN

## 2016-02-20 MED ORDER — ASPIRIN 81 MG PO TBEC: 81.0000 mg | DELAYED_RELEASE_TABLET | Freq: Every day | ORAL | 11 refills | Status: DC

## 2016-02-20 MED ORDER — ATORVASTATIN CALCIUM 80 MG PO TABS: 80.0000 mg | ORAL_TABLET | Freq: Every day | ORAL | 1 refills | Status: DC

## 2016-02-20 MED FILL — Heparin Sodium (Porcine) 2 Unit/ML in Sodium Chloride 0.9%: INTRAMUSCULAR | Qty: 1000 | Status: AC

## 2016-02-20 NOTE — Progress Notes (Signed)
CARDIAC REHAB PHASE I   PRE:  Rate/Rhythm: 69 SR  BP:  Sitting: 96/6        SaO2: 95 RA  MODE:  Ambulation: 350 ft   POST:  Rate/Rhythm: 77 SR  BP:  Sitting: 94/58         SaO2: 97 RA  Pt ambulated 350 ft on RA, handheld assist, steady gait, tolerated well.  Pt c/o of mild DOE, denies cp, dizziness, declined rest stop. Completed MI/stent education with pt and pt's son at bedside.  Reviewed risk factors, MI book, anti-platelet therapy, stent card, activity restrictions, ntg, exercise, heart healthy diet, portion control, and phase 2 cardiac rehab. Pt verbalized understanding. Pt agrees to phase 2 cardiac rehab referral, will send to [place] per pt request. Pt to bed per pt request after walk, call bell within reach. Will follow.   [id number] Joylene Grapes, RN, BSN 02/20/2016 2:52 PM

## 2016-02-20 NOTE — Discharge Summary (Signed)
Physician Discharge Summary  Patient ID: Carolyn Gaines MRN: [id number] DOB/AGE: June 20, 1966 50 y.o.  Admit date: 02/19/2016 Discharge date: 02/21/2016  Discharge Diagnoses:  1. Acute inferior and posterior infarct. 2. S/P PTCA and stenting of the mid RCA with a 3.0 x 30 mm resolute DES. Residual 60% - 70% mid LAD stenosis. Unable to tolerate BB presently due to bradycardia and hypotension. 3. Hyperlipidemia 4. Occasional tobacco use. 5. Anxiety and depression, chronic and mild.  Significant Diagnostic Studies:  Coronary angiogram 02/19/2016:  Mildly elevated LVEDP.  Mid RCA 99% stenosed S/P 3.0 x 30 mm resolute DES reduced to 0%, TIMI-3 to TIMI-3 flow.  Left main ostial 30-40%, mid LAD diffuse long segment disease of 20-30% followed by a focal 60-70% stenosis. Small ramus intermediate and circumflex without significant disease.  Hospital Course: Carolyn Gaines  is a 50 y.o. female  With h/o hyperlipidemia and fibromyalgia and smokes occasionally admitted with chest pain that started this evening, waxing and waning, felt very uncomfortable, as she was not feeling better, activated the CMS, was found to have ST segment elevation in the inferior leads. STEMI was activated and patient directly transferred to [facility] for evaluation. She underwent successful PTCA and stenting of the mid RCA with a 3.0 x 30 mm resolute DES.   Recommendations on discharge: She has a very large LAD with diffusely diseased proximal to mid segment diffuse disease and midsegment has a 60-70% stenosis which appears to be hazy at the third septal perforator which also appears to have a ulcerated lesion. She will need outpatient stress testing to evaluate the significance of the LAD stenosis as well as echocardiogram. Left main stenosis does not appear to be significant and needs aggressive risk factor modification including statin therapy.  Again discussed smoking cessation.  Unable to tolerate BB presently due to  bradycardia and hypotension. Follow up in 7-10 days.  Will try initiating therapy with BB and ACEi as OP basis.  I have discontinued Trazadone that she was using for sleep and added Lexapro both for depression and anxiety also added Xanax for a short time.   Discharge Exam: Blood pressure (!) 98/48, pulse 72, temperature 98.6 F (37 C), resp. rate 19, height 5\' 4"  (1.626 m), weight 79.4 kg (175 lb 1.6 oz), SpO2 98 %.    General appearance: alert, cooperative, appears stated age, mild distress and appears to be in pain Eyes: negative findings: lids and lashes normal Neck: no adenopathy, no carotid bruit, no JVD, supple, symmetrical, trachea midline and thyroid not enlarged, symmetric, no tenderness/mass/nodules Resp: Faint bibasilar crackles heard. Normal breath sounds. Chest wall: no tenderness Cardio: regular rate and rhythm, S1, S2 normal, no murmur, click, rub or gallop GI: soft, non-tender; bowel sounds normal; no masses,  no organomegaly Extremities: extremities normal, atraumatic, no cyanosis or edema Pulses: 2+ and symmetric, right radial access has healed well. Skin: Skin color, texture, turgor normal. No rashes or lesions Neurologic: Grossly normal  Labs:   Lab Results  Component Value Date   WBC 5.3 02/20/2016   HGB 10.5 (L) 02/20/2016   HCT 32.9 (L) 02/20/2016   MCV 90.6 02/20/2016   PLT 179 02/20/2016     Recent Labs Lab 02/19/16 2017 02/20/16 0312  NA 136 140  K 3.0* 4.0  CL 109 114*  CO2 21* 23  BUN 12 10  CREATININE 0.80 0.82  CALCIUM 8.2* 8.2*  PROT 6.5  --   BILITOT 0.3  --   ALKPHOS 69  --  ALT 14  --   AST 24  --   GLUCOSE 129* 114*    Lipid Panel     Component Value Date/Time   CHOL 162 02/19/2016 2017   TRIG 71 02/19/2016 2017   HDL 33 (L) 02/19/2016 2017   CHOLHDL 4.9 02/19/2016 2017   VLDL 14 02/19/2016 2017   LDLCALC 115 (H) 02/19/2016 2017   Recent Labs  02/19/16 2017 02/20/16 0312  TROPONINI 0.12* 13.95*    Lab Results   Component Value Date   TROPONINI 2.56 (HH) 02/21/2016    Recent Labs  02/20/16 0312  TSH 0.905   EKG 02/21/2016: Sinus rhythm at a rate of 70 bpm, normal axis, T wave abnormality in inferior leads, cannot exclude ischemia.  EKG: 02/19/2016: Sinus bradycardia at the rate of 57 bpm, ST segment elevation in the inferior leads with ST depression in the anterior leads suggestive of acute inferior injury pattern, probable posterior infarct acute. Lateral ischemia versus reciprocal changes. STEMI  FOLLOW UP PLANS AND APPOINTMENTS: Will schedule f/u in 2 weeks with  Me.     Medication List    STOP taking these medications   traZODone 150 MG tablet Commonly known as:  DESYREL     TAKE these medications   ALPRAZolam 1 MG tablet Commonly known as:  XANAX Take 0.5 tablets (0.5 mg total) by mouth 2 (two) times daily as needed for anxiety.   aspirin 81 MG EC tablet Take 1 tablet (81 mg total) by mouth daily.   aspirin-acetaminophen-caffeine 250-250-65 MG tablet Commonly known as:  EXCEDRIN MIGRAINE Take 2 tablets by mouth 3 (three) times daily as needed for migraine.   atorvastatin 80 MG tablet Commonly known as:  LIPITOR Take 1 tablet (80 mg total) by mouth daily at 6 PM.   escitalopram 10 MG tablet Commonly known as:  LEXAPRO Take 1 tablet (10 mg total) by mouth daily.   fluocinonide-emollient 0.05 % cream Commonly known as:  LIDEX-E Apply 1 application topically 2 (two) times daily as needed (psoriasis).   nitroGLYCERIN 0.4 MG SL tablet Commonly known as:  NITROSTAT Place 1 tablet (0.4 mg total) under the tongue every 5 (five) minutes x 3 doses as needed for chest pain.   ticagrelor 90 MG Tabs tablet Commonly known as:  BRILINTA Take 1 tablet (90 mg total) by mouth 2 (two) times daily.   traMADol 50 MG tablet Commonly known as:  ULTRAM Take 100-150 mg by mouth every 4 (four) hours as needed (pain).        Follow-up Information    Yates Decamp, MD. Schedule an  appointment as soon as possible for a visit in 1 week(s).   Specialty:  Cardiology Contact information: [address] [place] [address] [phone]          Erling Conte, NP-C 02/20/2016, 8:58 AM  Pager: [phone] Office: [phone] If no answer: [phone]

## 2016-02-20 NOTE — Progress Notes (Signed)
Subjective:  Continues to complain of chest pain, likely component of anxiety. Chest wall also tender to palpation.   Objective:  Vital Signs in the last 24 hours: Temp:  [97.8 F (36.6 C)-98.4 F (36.9 C)] 98.2 F (36.8 C) (07/24 0800) Pulse Rate:  [0-212] 49 (07/24 0800) Resp:  [0-37] 13 (07/24 0800) BP: (72-122)/(44-74) 101/57 (07/24 0800) SpO2:  [0 %-100 %] 97 % (07/24 0800) Weight:  [79.6 kg (175 lb 7.8 oz)] 79.6 kg (175 lb 7.8 oz) (07/23 2035)  Intake/Output from previous day: 07/23 0701 - 07/24 0700 In: 2110 [P.O.:360; I.V.:750; IV Piggyback:1000] Out: 450 [Urine:450]  Physical Exam: General appearance: alert, cooperative, appears stated age, mild distress and appears to be in pain Eyes: negative findings: lids and lashes normal Neck: no adenopathy, no carotid bruit, no JVD, supple, symmetrical, trachea midline and thyroid not enlarged, symmetric, no tenderness/mass/nodules Resp: Faint bibasilar crackles heard. Normal breath sounds. Chest wall: no tenderness Cardio: regular rate and rhythm, S1, S2 normal, no murmur, click, rub or gallop GI: soft, non-tender; bowel sounds normal; no masses,  no organomegaly Extremities: extremities normal, atraumatic, no cyanosis or edema Pulses: 2+ and symmetric Skin: Skin color, texture, turgor normal. No rashes or lesions Neurologic: Grossly normal   Lab Results: BMP  Recent Labs  02/19/16 1944 02/19/16 2017 02/20/16 0312  NA 138 136 140  K 3.1* 3.0* 4.0  CL 117* 109 114*  CO2  --  21* 23  GLUCOSE 125* 129* 114*  BUN CREATININE 0.70 0.80 0.82  CALCIUM  --  8.2* 8.2*  GFRNONAA  --  >60 >60  GFRAA  --  >60 >60    CBC  Recent Labs Lab 02/19/16 1917  02/20/16 0312  WBC 7.3  --  5.3  RBC 4.31  --  3.63*  HGB 12.8  < > 10.5*  HCT 38.6  < > 32.9*  PLT 182  --  179  MCV 89.6  --  90.6  MCH 29.7  --  28.9  MCHC 33.2  --  31.9  RDW 13.2  --  13.4  LYMPHSABS 1.7  --   --   MONOABS 0.5  --   --   EOSABS  0.2  --   --   BASOSABS 0.0  --   --   < > = values in this interval not displayed.  HEMOGLOBIN A1C No results found for: HGBA1C, MPG  Cardiac Panel (last 3 results)  Recent Labs  02/19/16 2017 02/20/16 0312  TROPONINI 0.12* 13.95*    BNP (last 3 results) No results for input(s): PROBNP in the last 8760 hours.  TSH  Recent Labs  02/20/16 0312  TSH 0.905    CHOLESTEROL  Recent Labs  02/19/16 2017  CHOL 162    Hepatic Function Panel  Recent Labs  02/19/16 2017  PROT 6.5  ALBUMIN 3.3*  AST 24  ALT 14  ALKPHOS 69  BILITOT 0.3    Imaging: No results found.  Cardiac Studies: Coronary angiogram 02/19/2016:  Mildly elevated LVEDP.  Mid RCA 99% stenosed S/P 3.0 x 30 mm resolute DES reduced to 0%, TIMI-3 to TIMI-3 flow.  Left main ostial 30-40%, mid LAD diffuse long segment disease of 20-30% followed by a focal 60-70% stenosis. Small ramus intermediate and circumflex without significant disease.  EKG: 02/19/2016: Sinus bradycardia at the rate of 57 bpm, ST segment elevation in the inferior leads with ST depression in the anterior leads suggestive of acute inferior injury pattern,  probable posterior infarct acute. Lateral ischemia versus reciprocal changes. STEMI   Assessment/Plan:  1. Acute inferior and posterior infarct. 2. S/P PTCA and stenting of the mid RCA with a 3.0 x 30 mm resolute DES 3. Hyperlipidemia 4. Occasional tobacco use.  Recommendation: Proceed with initiation of cardiac rehab. Will continue to monitor through the night given ongoing chest pain. Pt markedly tender to palpation, doubt cardiac etiology of pain. Transfer to telemetry.   Erling Conte, NP-C 02/20/2016, 9:08 AM [facility] Pager: [phone] Office: [phone]

## 2016-02-20 NOTE — Care Management Note (Addendum)
Case Management Note  Patient Details  Name: Carolyn Gaines MRN: [id number] Date of Birth: Jan 25, 1966  Subjective/Objective:        Adm w mi            Action/Plan: lives w Melvenia Needles, works at [employer], has ins   Expected Discharge Date:  02/22/16               Expected Discharge Plan:  Home/Self Care  In-House Referral:     Discharge planning Services  CM Consult, Medication Assistance  Post Acute Care Choice:    Choice offered to:     DME Arranged:    DME Agency:     HH Arranged:    HH Agency:     Status of Service:     If discussed at [employer], dates discussed:    Additional Comments: gave pt 30day free brilinta card and copay card. States has ins.  Hanley Hays, RN 02/20/2016, 11:05 AM

## 2016-02-21 LAB — TROPONIN I
TROPONIN I: 2.08 ng/mL — AB (ref <0.03)
Troponin I: 2.56 ng/mL (ref <0.03)
Troponin I: 1.95 ng/mL (ref <0.03)

## 2016-02-21 MED ORDER — ALPRAZOLAM 1 MG PO TABS: 0.5000 mg | ORAL_TABLET | Freq: Two times a day (BID) | ORAL | 0 refills | Status: AC | PRN

## 2016-02-21 MED ORDER — ESCITALOPRAM OXALATE 10 MG PO TABS
10.0000 mg | ORAL_TABLET | Freq: Every day | ORAL | 2 refills | Status: DC
Start: 2016-02-21 — End: 2016-07-03

## 2016-02-21 NOTE — Discharge Instructions (Signed)
Acute Coronary Syndrome °Acute coronary syndrome (ACS) is a serious problem in which there is suddenly not enough blood and oxygen supplied to the heart. ACS may mean that one or more of the blood vessels in your heart (coronary arteries) may be blocked. ACS can result in chest pain or a heart attack (myocardial infarction or MI). °CAUSES °This condition is caused by atherosclerosis, which is the buildup of fat and cholesterol (plaque) on the inside of the arteries. Over time, the plaque may narrow or block the artery, and this will lessen blood flow to the heart. Plaque can also become weak and break off within a coronary artery to form a clot and cause a sudden blockage. °RISK FACTORS °The risks factors of this condition include: °· High cholesterol levels. °· High blood pressure (hypertension). °· Smoking. °· Diabetes. °· Age. °· Family history of chest pain, heart disease, or stroke. °· Lack of exercise. °SYMPTOMS °The most common signs of this condition include: °· Chest pain, which can be: °¨ A crushing or squeezing in the chest. °¨ A tightness, pressure, fullness, or heaviness in the chest. °¨ Present for more than a few minutes, or it can stop and recur. °· Pain in the arms, neck, jaw, or back. °· Unexplained heartburn or indigestion. °· Shortness of breath. °· Nausea. °· Sudden cold sweats. °· Feeling light-headed or dizzy. °Sometimes, this condition has no symptoms. °DIAGNOSIS °ACS may be diagnosed through the following tests: °· Electrocardiogram (ECG). °· Blood tests. °· Coronary angiogram. This is a procedure to look at the coronary arteries to see if there is any blockage. °TREATMENT °Treatment for ACS may include: °· Healthy behavioral changes to reduce or control risk factors. °· Medicine. °· Coronary stenting. A stent helps to keep an artery open. °· Coronary angioplasty. This procedure widens a narrowed or blocked artery. °· Coronary artery bypass surgery. This will allow your blood to pass the  blockage (bypass) to reach your heart. °HOME CARE INSTRUCTIONS °Eating and Drinking °· Follow a heart-healthy diet. A dietitian can you help to educate you about healthy food options and changes. °· Use healthy cooking methods such as roasting, grilling, broiling, baking, poaching, steaming, or stir-frying. Talk to a dietitian to learn more about healthy cooking methods. °Medicines °· Take medicines only as directed by your health care provider. °· Do not take the following medicines unless your health care provider approves: °¨ Nonsteroidal anti-inflammatory drugs (NSAIDs), such as ibuprofen, naproxen, or celecoxib. °¨ Vitamin supplements that contain vitamin A, vitamin E, or both. °¨ Hormone replacement therapy that contains estrogen with or without progestin. °· Stop illegal drug use. °Activities °· Follow an exercise program that is approved by your health care provider. °· Plan rest periods when you are fatigued. °Lifestyle °· Do not use any tobacco products, including cigarettes, chewing tobacco, or electronic cigarettes. If you need help quitting, ask your health care provider. °· If you drink alcohol, and your health care provider approves, limit your alcohol intake to no more than 1 drink per day. One drink equals 12 ounces of beer, 5 ounces of wine, or 1½ ounces of hard liquor. °· Learn to manage stress. °· Maintain a healthy weight. Lose weight as approved by your health care provider. °General Instructions °· Manage other health conditions, such as hypertension and diabetes, as directed by your health care provider. °· Keep all follow-up visits as directed by your health care provider. This is important. °· Your health care provider may ask you to monitor your blood   pressure. A blood pressure reading consists of a higher number over a lower number, such as 110 over 72, written as 110/72. Ideally, your blood pressure should be:  Below 140/90 if you have no other medical conditions.  Below 130/80 if  you have diabetes or kidney disease. SEEK IMMEDIATE MEDICAL CARE IF:  You have pain in your chest, neck, arm, jaw, stomach, or back that lasts more than a few minutes, is recurring, or is not relieved by taking medicine under your tongue (sublingual nitroglycerin).  You have profuse sweating without cause.  You have unexplained:  Heartburn or indigestion.  Shortness of breath or difficulty breathing.  Nausea or vomiting.  Fatigue.  Feelings of nervousness or anxiety.  Weakness.  Diarrhea.  You have sudden light-headedness or dizziness.  You faint. These symptoms may represent a serious problem that is an emergency. Do not wait to see if the symptoms will go away. Get medical help right away. Call your local emergency services (911 in the [place] Do not drive yourself to the clinic or hospital.   This information is not intended to replace advice given to you by your health care provider. Make sure you discuss any questions you have with your health care provider.   Document Released: 07/16/2005 Document Revised: 08/06/2014 Document Reviewed: 11/17/2013 Elsevier Interactive Patient Education 2016 [employer]  Coronary Angiogram With Stent, Care After Refer to this sheet in the next few weeks. These instructions provide you with information about caring for yourself after your procedure. Your health care provider may also give you more specific instructions. Your treatment has been planned according to current medical practices, but problems sometimes occur. Call your health care provider if you have any problems or questions after your procedure. WHAT TO EXPECT AFTER THE PROCEDURE  After your procedure, it is typical to have the following:  Bruising at the catheter insertion site that usually fades within 1-2 weeks.  Blood collecting in the tissue (hematoma) that may be painful to the touch. It should usually decrease in size and tenderness within 1-2 weeks. HOME CARE  INSTRUCTIONS  Take medicines only as directed by your health care provider. Blood thinners may be prescribed after your procedure to improve blood flow through the stent.  You may shower 24-48 hours after the procedure or as directed by your health care provider. Remove the bandage (dressing) and gently wash the catheter insertion site with plain soap and water. Pat the area dry with a clean towel. Do not rub the site, because this may cause bleeding.  Do not take baths, swim, or use a hot tub until your health care provider approves.  Check your catheter insertion site every day for redness, swelling, or drainage.  Do not apply powder or lotion to the site.  Do not lift over 10 lb (4.5 kg) for 5 days after your procedure or as directed by your health care provider.  Ask your health care provider when it is okay to:  Return to work or school.  Resume usual physical activities or sports.  Resume sexual activity.  Eat a heart-healthy diet. This should include plenty of fresh fruits and vegetables. Meat should be lean cuts. Avoid the following types of food:  Food that is high in salt.  Canned or highly processed food.  Food that is high in saturated fat or sugar.  Fried food.  Make any other lifestyle changes as recommended by your health care provider. These may include:  Not using any tobacco products, including  cigarettes, chewing tobacco, or electronic cigarettes.If you need help quitting, ask your health care provider.  Managing your weight.  Getting regular exercise.  Managing your blood pressure.  Limiting your alcohol intake.  Managing other health problems, such as diabetes.  If you need an MRI after your heart stent has been placed, be sure to tell the health care provider who orders the MRI that you have a heart stent.  Keep all follow-up visits as directed by your health care provider. This is important. SEEK MEDICAL CARE IF:  You have a fever.  You  have chills.  You have increased bleeding from the catheter insertion site. Hold pressure on the site. SEEK IMMEDIATE MEDICAL CARE IF:  You develop chest pain or shortness of breath, feel faint, or pass out.  You have unusual pain at the catheter insertion site.  You have redness, warmth, or swelling at the catheter insertion site.  You have drainage (other than a small amount of blood on the dressing) from the catheter insertion site.  The catheter insertion site is bleeding, and the bleeding does not stop after 30 minutes of holding steady pressure on the site.  You develop bleeding from any other place, such as from your rectum. There may be bright red blood in your urine or stool, or it may appear as black, tarry stool.   This information is not intended to replace advice given to you by your health care provider. Make sure you discuss any questions you have with your health care provider.   Document Released: 02/02/2005 Document Revised: 08/06/2014 Document Reviewed: 12/08/2012 Elsevier Interactive Patient Education [employer]  Cardiac Rehabilitation Cardiac rehabilitation is a medically supervised program that helps improve the health and well-being of people with heart problems. Cardiac rehabilitation includes exercise training, education, and counseling to help you get stronger and return to an active lifestyle. People who participate in cardiac rehabilitation programs get better faster and reduce future hospital stays. Cardiac rehabilitation programs can help when you have had the following conditions:  Heart attack.  Heart failure.  Peripheral artery disease.  Coronary artery disease.  Angina.  Lung or breathing problems. Cardiac rehabilitation programs are also used when you have the following procedures:  Coronary artery bypass graft surgery.  Heart valve replacement.  Heart stent placement.  Heart transplant.  Aneurysm repair. CARDIAC  REHABILITATION MAY HELP YOU:  Reduce problems like chest pain and trouble breathing.  Change risk factors that contribute to heart disease, such as:  Smoking.  High blood pressure.  High cholesterol.  Diabetes.  Being out of shape or not active.  Weighing more than 30% over your ideal weight.  Diet.  Improve your mental outlook so you feel:  Less depressed or "blue."  More hopeful.  Better about yourself.  More confident about taking care of yourself.  Get support from health experts as well as other people with similar problems.  Learn how to manage and understand your medicines.  Teach your family about your condition and how to participate in your recovery. WHAT HAPPENS IN CARDIAC REHABILITATION? You will be assessed by a cardiac rehabilitation team. They will check your health history and do a physical exam. You may need blood tests, stress tests, and other evaluations. You may not start a cardiac rehabilitation program if:  You develop angina with exercise or while at rest.  You have severe heart failure that limits your activity.  You have an abnormal heart rhythm at rest.  You develop heart rhythm  problems during exercise.  You have high blood pressure that is not controlled. The cardiac rehabilitation team works with you to make a plan based on your health and goals. Everyone is unique, so each program is customized and your program may change as you progress. Members of a typical cardiac rehabilitation team may include such health professionals as:  Doctors.  Nurses.  Dietitians.  Psychologists.  Exercise specialists.  Physical and occupational therapists. A typical cardiac rehabilitation program is divided into phases. You advance from one phase to the next. Most cardiac rehabilitation sessions last for 60 minutes, 3 times a week.  Phase One starts while you are still in the hospital. You may start by walking in your room and then in the hall.  You may start some simple exercises with a therapist. Health care team members will give you information and ask you many questions. You may not be able to remember details, so have a family member or an advocate with you to help keep track of information.  Phase Two begins when you go home or to another facility. This phase may last 8 to 12 weeks. You will travel to a cardiac rehabilitation center or a place where it is offered. Typically, you gradually increase your activity while being closely watched by a nurse or therapist. Exercises may be a combination of strength or resistance training and "cardio" or aerobic movement on a treadmill or other machines. Your condition will determine how often and how long these sessions will last.  In phase two, you may learn how to cook healthy meals, control your blood sugar, and manage your medicines. You may need help with scheduling or planning how and when to take your medicines. Use a timer, divided pill box, or follow a form to make taking your medicines easier. Use the method that works best for you. Some medicines should not be taken with certain foods. If you take more than one blood pressure medicine, you may need to stagger the times you take them. Taking all your blood pressure medicine at the same time may lower your blood pressure too much. If you have questions about your medicines, ask your health care provider questions until you understand.  Phase Three continues for the rest of your life. There will be less supervision. You may still participate in cardiac rehabilitation activities or become part of a group in your community. You may benefit from talking to other people about your experience if they are facing similar challenges. How soon you drive, have sex, or return to work will depend on your condition. These decisions should be made by you and your health care provider. If you need help, ask for it. Find out where you can get the help you need.  Ask questions until you get answers and understand. SEEK IMMEDIATE MEDICAL CARE IF:  Get medical help at once if you experience any of the following symptoms:  Severe chest discomfort, especially if the pain is crushing or pressure-like and spreads to the arms, back, neck, or jaw. Do not wait to see if the pain will go away.  Weakness or numbness in your face, arms, or legs, especially on one side of the body; slurred speech; confusion; sudden severe headache or loss of vision (all symptoms of stroke).  You have shortness of breath.  You are sweating and feel sick to your stomach (nausea).  You feel dizzy or faint.  You experience profound tiredness (fatigue). Call your local emergency service (911 in the  [place] Do not drive yourself to the hospital.   This information is not intended to replace advice given to you by your health care provider. Make sure you discuss any questions you have with your health care provider.   Document Released: 04/24/2008 Document Revised: 08/06/2014 Document Reviewed: 10/20/2010 Elsevier Interactive Patient Education [employer]

## 2016-02-21 NOTE — Care Management Note (Signed)
Case Management Note  Patient Details  Name: Carolyn Gaines MRN: [id number] Date of Birth: September 19, 1965  Subjective/Objective: Pt in for CP-Stemi. Pt is post cath. Pt listed as not having insurance, however pt states she does work @ [employer] and has insurance. 30 day free Brilinta card had been given to patient.   Action/Plan: CM did make pt aware that she can get Brilinta from [facility]. Pt will be able to use the co pay card. No further needs from CM at this time.    Expected Discharge Date:  02/22/16               Expected Discharge Plan:  Home/Self Care  In-House Referral:  NA  Discharge planning Services  CM Consult, Medication Assistance  Post Acute Care Choice:  NA Choice offered to:  NA  DME Arranged:  N/A DME Agency:  NA  HH Arranged:  NA HH Agency:  NA  Status of Service:  Completed, signed off  If discussed at Long Length of Stay Meetings, dates discussed:    Additional Comments:  Gala Lewandowsky, RN 02/21/2016, 11:51 AM

## 2016-02-21 NOTE — Progress Notes (Signed)
CARDIAC REHAB PHASE I   PRE:  Rate/Rhythm: 78 SR    BP: sitting 92/58    SaO2:   MODE:  Ambulation: 550 ft   POST:  Rate/Rhythm: 85 SR    BP: sitting 107/60     SaO2:   Pt c/o feeling "groggy". On further questioning she sts she is lightheaded. Also c/o soreness in bed. Pt able to walk without increase in lightheadedness, sts she felt about the same after walking. BP slightly increased. Reviewed ed. [id number]   Harriet Masson CES, ACSM 02/21/2016 11:28 AM

## 2016-02-29 DIAGNOSIS — R0789 Other chest pain: Secondary | ICD-10-CM | POA: Diagnosis not present

## 2016-02-29 DIAGNOSIS — I2510 Atherosclerotic heart disease of native coronary artery without angina pectoris: Secondary | ICD-10-CM | POA: Diagnosis not present

## 2016-02-29 DIAGNOSIS — F418 Other specified anxiety disorders: Secondary | ICD-10-CM | POA: Diagnosis not present

## 2016-02-29 DIAGNOSIS — E7800 Pure hypercholesterolemia, unspecified: Secondary | ICD-10-CM | POA: Diagnosis not present

## 2016-03-08 DIAGNOSIS — I2510 Atherosclerotic heart disease of native coronary artery without angina pectoris: Secondary | ICD-10-CM | POA: Diagnosis not present

## 2016-03-08 DIAGNOSIS — E7800 Pure hypercholesterolemia, unspecified: Secondary | ICD-10-CM | POA: Diagnosis not present

## 2016-03-14 ENCOUNTER — Other Ambulatory Visit: Payer: Self-pay

## 2016-03-14 ENCOUNTER — Emergency Department (HOSPITAL_COMMUNITY)
Admission: EM | Admit: 2016-03-14 | Discharge: 2016-03-14 | Disposition: A | Discharge status: Home / Self Care | Payer: BLUE CROSS/BLUE SHIELD | Attending: Emergency Medicine | Admitting: Emergency Medicine

## 2016-03-14 ENCOUNTER — Emergency Department (HOSPITAL_COMMUNITY): Payer: BLUE CROSS/BLUE SHIELD

## 2016-03-14 DIAGNOSIS — K5900 Constipation, unspecified: Secondary | ICD-10-CM | POA: Diagnosis not present

## 2016-03-14 DIAGNOSIS — I2510 Atherosclerotic heart disease of native coronary artery without angina pectoris: Secondary | ICD-10-CM | POA: Diagnosis not present

## 2016-03-14 DIAGNOSIS — R079 Chest pain, unspecified: Secondary | ICD-10-CM | POA: Diagnosis not present

## 2016-03-14 DIAGNOSIS — F17200 Nicotine dependence, unspecified, uncomplicated: Secondary | ICD-10-CM | POA: Insufficient documentation

## 2016-03-14 DIAGNOSIS — Z7982 Long term (current) use of aspirin: Secondary | ICD-10-CM | POA: Diagnosis not present

## 2016-03-14 DIAGNOSIS — R0789 Other chest pain: Secondary | ICD-10-CM | POA: Insufficient documentation

## 2016-03-14 DIAGNOSIS — R0602 Shortness of breath: Secondary | ICD-10-CM | POA: Diagnosis not present

## 2016-03-14 LAB — I-STAT TROPONIN, ED: TROPONIN I, POC: 0.01 ng/mL (ref 0.00–0.08)

## 2016-03-14 NOTE — Discharge Instructions (Signed)
We could not find any evidence of damage to your heart, so your pain is most likely due to over-exerting yourself at work. You can try Tylenol to help with your chest wall pain and headache. If your chest pain and/or trouble breathing worsens, please return to the emergency room. It is important to keep all of your follow-up appointments with Dr. Jacinto Halim.

## 2016-03-14 NOTE — ED Notes (Signed)
EDP aware of low BP which is the pt's normal range of BP.

## 2016-03-14 NOTE — ED Triage Notes (Signed)
Pt comes in from home via EMS with c/o chest pain for the past few days. Pt has h/o 1x cardiac stent placed 3 weeks ago. Pt has a strenuous job and went back to work on Monday. Pt feels like she overworked herself. Pt was hypotensive on EMS arrival. Pt given 1000cc nacl, 4mg  Zofran and 324mg  asa. Unremarkable 12 lead.

## 2016-03-14 NOTE — ED Provider Notes (Signed)
[facility]PT Provider Note   CSN: [id number] Arrival date & time: 03/14/16  1020  History   Chief Complaint Chief Complaint  Patient presents with  . Chest Pain  . Fatigue    HPI Carolyn Gaines is a 50 y.o. female.  HPI   Patient presents with chest pain.   Recent history of admission for chest pain with subsequent stent placement on 02/20/16. Went back to work for the first time two days ago, and has since had pain in her chest. She lifts heavy boxes at work and thinks she may have overexerted herself. Took nitro prescribed by her cardiologist with no relief. Describes the pain as tightness, denies pressure or sharp pain. Also endorses SOB when pain is most severe. Denies diaphoresis associated with tightness, but does report she has been diaphoretic in her sleep the past couple nights.  She last saw Dr. Jacinto Halim a few days ago, and has stress test and follow-up appointment scheduled with him in the new couple weeks. Will start cardiac rehab on 8/30.    Past Medical History:  Diagnosis Date  . Anxiety   . Arthritis   . Fibromyalgia     Patient Active Problem List   Diagnosis Date Noted  . Acute MI, inferior wall, initial episode of care (HCC) 02/19/2016  . CAD (coronary artery disease), native coronary artery 02/19/2016    Past Surgical History:  Procedure Laterality Date  . ABDOMINAL HYSTERECTOMY    . CARDIAC CATHETERIZATION N/A 02/19/2016   Procedure: Left Heart Cath and Coronary Angiography;  Surgeon: Yates Decamp, MD;  Location: [facility] INVASIVE CV LAB;  Service: Cardiovascular;  Laterality: N/A;  . CARDIAC CATHETERIZATION N/A 02/19/2016   Procedure: Coronary Stent Intervention;  Surgeon: Yates Decamp, MD;  Location: [facility] INVASIVE CV LAB;  Service: Cardiovascular;  Laterality: N/A;    OB History    No data available     Home Medications    Prior to Admission medications   Medication Sig Start Date End Date Taking? Authorizing Provider  ALPRAZolam Prudy Feeler) 1 MG tablet Take  0.5 tablets (0.5 mg total) by mouth 2 (two) times daily as needed for anxiety. 02/21/16 03/22/16 Yes Yates Decamp, MD  aspirin EC 81 MG EC tablet Take 1 tablet (81 mg total) by mouth daily. 02/20/16  Yes Marcy Salvo, NP  aspirin-acetaminophen-caffeine (EXCEDRIN MIGRAINE) [phone] MG tablet Take 2 tablets by mouth 3 (three) times daily as needed for migraine.   Yes Historical Provider, MD  atorvastatin (LIPITOR) 80 MG tablet Take 1 tablet (80 mg total) by mouth daily at 6 PM. 02/20/16  Yes Marcy Salvo, NP  carvedilol (COREG) 3.125 MG tablet Take 3.125 mg by mouth 2 (two) times daily with a meal.   Yes Historical Provider, MD  escitalopram (LEXAPRO) 10 MG tablet Take 1 tablet (10 mg total) by mouth daily. Patient taking differently: Take 20 mg by mouth daily.  02/21/16  Yes Yates Decamp, MD  fluocinonide-emollient (LIDEX-E) 0.05 % cream Apply 1 application topically 2 (two) times daily as needed (psoriasis).   Yes Historical Provider, MD  oxyCODONE-acetaminophen (PERCOCET) 7.5-325 MG tablet Take 1 tablet by mouth every 4 (four) hours as needed for severe pain.   Yes Historical Provider, MD  ticagrelor (BRILINTA) 90 MG TABS tablet Take 1 tablet (90 mg total) by mouth 2 (two) times daily. 02/20/16  Yes Marcy Salvo, NP  traMADol (ULTRAM) 50 MG tablet Take 100-150 mg by mouth every 4 (four) hours as needed (pain).   Yes Historical Provider, MD  nitroGLYCERIN (NITROSTAT) 0.4 MG SL tablet Place 1 tablet (0.4 mg total) under the tongue every 5 (five) minutes x 3 doses as needed for chest pain. 02/20/16   Marcy SalvoBridgette Allison, NP    Family History No family history on file.  Social History Social History  Substance Use Topics  . Smoking status: Current Some Day Smoker  . Smokeless tobacco: Never Used  . Alcohol use No     Allergies   Adhesive [tape]; Bee venom; and Triple antibiotic [bacitracin-neomycin-polymyxin]   Review of Systems Review of Systems  Constitutional: Positive for  diaphoresis. Negative for fever.  Respiratory: Positive for chest tightness and shortness of breath.   Cardiovascular: Negative for chest pain, palpitations and leg swelling.  Gastrointestinal: Positive for constipation (Chronic). Negative for abdominal pain, diarrhea, nausea and vomiting.  Allergic/Immunologic: Negative for immunocompromised state.  Neurological: Positive for headaches.   Physical Exam Updated Vital Signs BP 102/67   Pulse (!) 57   Temp 99 F (37.2 C) (Oral)   Resp 18   Ht 5\' 4"  (1.626 m)   Wt 76.2 kg   SpO2 98%   BMI 28.84 kg/m   Physical Exam  Constitutional: She is oriented to person, place, and time. She appears well-developed and well-nourished. No distress.  HENT:  Head: Normocephalic and atraumatic.  Right Ear: External ear normal.  Left Ear: External ear normal.  Nose: Nose normal.  Mouth/Throat: Oropharynx is clear and moist. No oropharyngeal exudate.  Eyes: Conjunctivae and EOM are normal. Pupils are equal, round, and reactive to light. Right eye exhibits no discharge. Left eye exhibits no discharge.  Cardiovascular: Normal rate, regular rhythm, normal heart sounds and intact distal pulses.   No murmur heard. Pulmonary/Chest: Effort normal and breath sounds normal. No respiratory distress. She has no wheezes.  Abdominal: Soft. Bowel sounds are normal. She exhibits no distension. There is no tenderness.  Musculoskeletal: She exhibits no edema or tenderness.  Chest wall TTP at midline  Neurological: She is alert and oriented to person, place, and time.  Skin: Skin is warm and dry. She is not diaphoretic.  Psychiatric: Her behavior is normal.  Flat affect   ED Treatments / Results  Labs (all labs ordered are listed, but only abnormal results are displayed) Labs Reviewed  Rosezena SensorI-STAT TROPOININ, ED    EKG  EKG Interpretation None       Radiology Dg Chest 2 View  Result Date: 03/14/2016 CLINICAL DATA:  Chest pain and shortness of breath. EXAM:  CHEST  2 VIEW COMPARISON:  12/13/2014 FINDINGS: Normal heart size and mediastinal contours. Right coronary stent noted. No acute infiltrate or edema. No effusion or pneumothorax. No acute osseous findings. IMPRESSION: Negative chest. Electronically Signed   By: Marnee SpringJonathon  Watts M.D.   On: 03/14/2016 11:49    Procedures Procedures (including critical care time)  Medications Ordered in ED Medications - No data to display   Initial Impression / Assessment and Plan / ED Course  I have reviewed the triage vital signs and the nursing notes.  Pertinent labs & imaging results that were available during my care of the patient were reviewed by me and considered in my medical decision making (see chart for details).  Clinical Course    Final Clinical Impressions(s) / ED Diagnoses   Final diagnoses:  Chest wall pain   Patient presenting with chest tightness and SOB. Likely MSK, as reproducible on palpation and patient admitting she was lifting heavy boxes for the past three days for first time in  multiple weeks. Trop negative, EKG NSR, CXR no acute abnormalities, making cardiac etiology less likely. Patient has f/u appointment with Dr. Jacinto Halim scheduled already. Encouraged her to keep this appointment. Can take Tylenol for MSK pain PRN.   New Prescriptions Discharge Medication List as of 03/14/2016 12:51 PM       Marquette Saa, MD 03/14/16 1333    Donnetta Hutching, MD 03/18/16 [phone]

## 2016-03-20 ENCOUNTER — Emergency Department (HOSPITAL_COMMUNITY): Payer: BLUE CROSS/BLUE SHIELD

## 2016-03-20 ENCOUNTER — Emergency Department (HOSPITAL_COMMUNITY)
Admission: EM | Admit: 2016-03-20 | Discharge: 2016-03-20 | Disposition: A | Discharge status: Home / Self Care | Payer: BLUE CROSS/BLUE SHIELD | Attending: Emergency Medicine | Admitting: Emergency Medicine

## 2016-03-20 ENCOUNTER — Encounter (HOSPITAL_COMMUNITY): Payer: Self-pay | Admitting: Emergency Medicine

## 2016-03-20 DIAGNOSIS — I2510 Atherosclerotic heart disease of native coronary artery without angina pectoris: Secondary | ICD-10-CM | POA: Diagnosis not present

## 2016-03-20 DIAGNOSIS — R079 Chest pain, unspecified: Secondary | ICD-10-CM

## 2016-03-20 DIAGNOSIS — K2971 Gastritis, unspecified, with bleeding: Secondary | ICD-10-CM | POA: Diagnosis not present

## 2016-03-20 DIAGNOSIS — R0789 Other chest pain: Secondary | ICD-10-CM | POA: Diagnosis not present

## 2016-03-20 DIAGNOSIS — Z955 Presence of coronary angioplasty implant and graft: Secondary | ICD-10-CM | POA: Diagnosis not present

## 2016-03-20 DIAGNOSIS — Z7901 Long term (current) use of anticoagulants: Secondary | ICD-10-CM | POA: Insufficient documentation

## 2016-03-20 DIAGNOSIS — Z87891 Personal history of nicotine dependence: Secondary | ICD-10-CM | POA: Insufficient documentation

## 2016-03-20 DIAGNOSIS — I252 Old myocardial infarction: Secondary | ICD-10-CM | POA: Diagnosis not present

## 2016-03-20 DIAGNOSIS — Z7982 Long term (current) use of aspirin: Secondary | ICD-10-CM | POA: Diagnosis not present

## 2016-03-20 LAB — CBC
HCT: 35.7 % — ABNORMAL LOW (ref 36.0–46.0)
Hemoglobin: 11.5 g/dL — ABNORMAL LOW (ref 12.0–15.0)
MCH: 28.8 pg (ref 26.0–34.0)
MCHC: 32.2 g/dL (ref 30.0–36.0)
MCV: 89.5 fL (ref 78.0–100.0)
PLATELETS: 313 10*3/uL (ref 150–400)
RBC: 3.99 MIL/uL (ref 3.87–5.11)
RDW: 14.2 % (ref 11.5–15.5)
WBC: 7 10*3/uL (ref 4.0–10.5)

## 2016-03-20 LAB — BASIC METABOLIC PANEL
Anion gap: 7 (ref 5–15)
BUN: 14 mg/dL (ref 6–20)
CALCIUM: 8.9 mg/dL (ref 8.9–10.3)
CHLORIDE: 104 mmol/L (ref 101–111)
CO2: 24 mmol/L (ref 22–32)
CREATININE: 0.79 mg/dL (ref 0.44–1.00)
Glucose, Bld: 104 mg/dL — ABNORMAL HIGH (ref 65–99)
Potassium: 3.7 mmol/L (ref 3.5–5.1)
SODIUM: 135 mmol/L (ref 135–145)

## 2016-03-20 LAB — I-STAT TROPONIN, ED
TROPONIN I, POC: 0 ng/mL (ref 0.00–0.08)
TROPONIN I, POC: 0 ng/mL (ref 0.00–0.08)

## 2016-03-20 LAB — D-DIMER, QUANTITATIVE (NOT AT ARMC): D DIMER QUANT: 1.57 ug{FEU}/mL — AB (ref 0.00–0.50)

## 2016-03-20 MED ORDER — MELOXICAM 7.5 MG PO TABS: 7.5000 mg | ORAL_TABLET | Freq: Every day | ORAL | 0 refills | Status: DC

## 2016-03-20 MED ORDER — IOPAMIDOL (ISOVUE-370) INJECTION 76%
INTRAVENOUS | Status: AC
  Administered 2016-03-20: 100 mL via INTRAVENOUS
  Filled 2016-03-20: qty 100

## 2016-03-20 MED ORDER — NITROGLYCERIN 2 % TD OINT
1.0000 [in_us] | TOPICAL_OINTMENT | Freq: Once | TRANSDERMAL | Status: AC
  Administered 2016-03-20: 1 [in_us] via TOPICAL
  Filled 2016-03-20: qty 1

## 2016-03-20 MED ORDER — KETOROLAC TROMETHAMINE 15 MG/ML IJ SOLN
15.0000 mg | Freq: Once | INTRAMUSCULAR | Status: AC
  Administered 2016-03-20: 15 mg via INTRAVENOUS
  Filled 2016-03-20: qty 1

## 2016-03-20 NOTE — ED Notes (Signed)
Patient transported to CT 

## 2016-03-20 NOTE — ED Triage Notes (Signed)
Per EMS pt woke from sleep w/ "burning non-radiating 10/10 centralized chest pain", she took mylanta - no relief, took 2 nitro and 100mg  tramadol - no relief.  EMS gave her 324 ASA, unremarkable EKG.  Pt reports a 5/10 pain when she arrived at the hospital.

## 2016-03-20 NOTE — ED Provider Notes (Signed)
[facility] Provider Note   CSN: [id number] Arrival date & time: 03/20/16  0443     History   Chief Complaint Chief Complaint  Patient presents with  . Chest Pain    HPI Dorothea Glassmanancy R Donatelli is a 50 y.o. female.  HPI  This is a 50 year old female with a history of coronary artery disease status post stent placement on 7/24 who presents for chest pain. Patient reports that she woke up at 2 AM with anterior chest pain that "was like pins and needles under her breast." Initially she thought was her ulcers. She took Mylanta without relief. She subsequently took 2 nitroglycerin without relief. She reports associated diaphoresis. No shortness of breath. She has never had pain like this before. Nothing seems to make it better or worse. Currently her pain is 5 out of 10.  Past Medical History:  Diagnosis Date  . Anxiety   . Arthritis   . Fibromyalgia     Patient Active Problem List   Diagnosis Date Noted  . Acute MI, inferior wall, initial episode of care (HCC) 02/19/2016  . CAD (coronary artery disease), native coronary artery 02/19/2016    Past Surgical History:  Procedure Laterality Date  . ABDOMINAL HYSTERECTOMY    . CARDIAC CATHETERIZATION N/A 02/19/2016   Procedure: Left Heart Cath and Coronary Angiography;  Surgeon: Yates DecampJay Ganji, MD;  [facility];  Service: Cardiovascular;  Laterality: N/A;  . CARDIAC CATHETERIZATION N/A 02/19/2016   Procedure: Coronary Stent Intervention;  Surgeon: Yates DecampJay Ganji, MD;  [facility];  Service: Cardiovascular;  Laterality: N/A;    OB History    No data available       Home Medications    Prior to Admission medications   Medication Sig Start Date End Date Taking? Authorizing Provider  ALPRAZolam Prudy Feeler(XANAX) 1 MG tablet Take 0.5 tablets (0.5 mg total) by mouth 2 (two) times daily as needed for anxiety. 02/21/16 03/22/16  Yates DecampJay Ganji, MD  aspirin EC 81 MG EC tablet Take 1 tablet (81 mg total) by mouth daily. 02/20/16    Marcy SalvoBridgette Allison, NP  aspirin-acetaminophen-caffeine (EXCEDRIN MIGRAINE) [phone] MG tablet Take 2 tablets by mouth 3 (three) times daily as needed for migraine.    Historical Provider, MD  atorvastatin (LIPITOR) 80 MG tablet Take 1 tablet (80 mg total) by mouth daily at 6 PM. 02/20/16   Marcy SalvoBridgette Allison, NP  carvedilol (COREG) 3.125 MG tablet Take 3.125 mg by mouth 2 (two) times daily with a meal.    Historical Provider, MD  escitalopram (LEXAPRO) 10 MG tablet Take 1 tablet (10 mg total) by mouth daily. Patient taking differently: Take 20 mg by mouth daily.  02/21/16   Yates DecampJay Ganji, MD  fluocinonide-emollient (LIDEX-E) 0.05 % cream Apply 1 application topically 2 (two) times daily as needed (psoriasis).    Historical Provider, MD  nitroGLYCERIN (NITROSTAT) 0.4 MG SL tablet Place 1 tablet (0.4 mg total) under the tongue every 5 (five) minutes x 3 doses as needed for chest pain. 02/20/16   Marcy SalvoBridgette Allison, NP  oxyCODONE-acetaminophen (PERCOCET) 7.5-325 MG tablet Take 1 tablet by mouth every 4 (four) hours as needed for severe pain.    Historical Provider, MD  ticagrelor (BRILINTA) 90 MG TABS tablet Take 1 tablet (90 mg total) by mouth 2 (two) times daily. 02/20/16   Marcy SalvoBridgette Allison, NP  traMADol (ULTRAM) 50 MG tablet Take 100-150 mg by mouth every 4 (four) hours as needed (pain).    Historical Provider, MD  Family History No family history on file.  Social History Social History  Substance Use Topics  . Smoking status: Former Smoker    Quit date: 03/21/2011  . Smokeless tobacco: Never Used  . Alcohol use Not on file     Allergies   Adhesive [tape]; Bee venom; and Triple antibiotic [bacitracin-neomycin-polymyxin]   Review of Systems Review of Systems  Constitutional: Positive for diaphoresis. Negative for fever.  Respiratory: Negative for cough and shortness of breath.   Cardiovascular: Positive for chest pain. Negative for leg swelling.  Gastrointestinal: Negative for abdominal  pain.  All other systems reviewed and are negative.    Physical Exam Updated Vital Signs BP 109/61 (BP Location: Right Arm)   Pulse 66   Temp 99.1 F (37.3 C) (Oral)   Resp 13   Ht 5\' 4"  (1.626 m)   Wt 168 lb (76.2 kg)   SpO2 98%   BMI 28.84 kg/m   Physical Exam  Constitutional: She is oriented to person, place, and time. She appears well-developed and well-nourished. No distress.  HENT:  Head: Normocephalic and atraumatic.  Cardiovascular: Normal rate, regular rhythm and normal heart sounds.   Pulmonary/Chest: Effort normal. No respiratory distress. She has no wheezes. She exhibits tenderness.  Tenderness to palpation anterior chest   Abdominal: Soft. Bowel sounds are normal. There is no tenderness.  Neurological: She is alert and oriented to person, place, and time.  Skin: Skin is warm and dry.  Psychiatric: She has a normal mood and affect.  Nursing note and vitals reviewed.    ED Treatments / Results  Labs (all labs ordered are listed, but only abnormal results are displayed) Labs Reviewed  BASIC METABOLIC PANEL  CBC  I-STAT TROPOININ, ED    EKG  EKG Interpretation None       Radiology No results found.  Procedures Procedures (including critical care time)  Medications Ordered in ED Medications  nitroGLYCERIN (NITROGLYN) 2 % ointment 1 inch (not administered)     Initial Impression / Assessment and Plan / ED Course  I have reviewed the triage vital signs and the nursing notes.  Pertinent labs & imaging results that were available during my care of the patient were reviewed by me and considered in my medical decision making (see chart for details).  Clinical Course  Comment By Time  No improvement with nitro.  Dimer added to w/u.   Shon Baton, MD 08/22 0600  Patient improved with toradol.   Shon Baton, MD 08/22 [phone]    Patient presents for chest pain. Onset several hours prior to arrival. Atypical pain. She recently did have a  stent placement. She was seen previously for musculoskeletal chest pain after lifting heavy boxes at work. She does have reproducible pain on exams on however, full workup initiated. EKG is nonischemic. Chest x-ray and initial troponin negative. No improvement with nitroglycerin. Toradol ordered and d-dimer added.  Patient reports significant improvement with Toradol. Dimer is elevated. Will obtain CT angio to evaluate for PE. Will also repeat troponin. If these are negative, suspect musculoskeletal component. Follow-up with Dr. Jacinto Halim.  Signed out to Dr. Clayborne Dana.  Final Clinical Impressions(s) / ED Diagnoses   Final diagnoses:  None    New Prescriptions New Prescriptions   No medications on file     Shon Baton, MD 03/21/16 0121

## 2016-03-20 NOTE — ED Provider Notes (Signed)
3:54 PM Assumed care from Dr. Wilkie Aye, please see their note for full history, physical and decision making until this point. In brief this is a 50 y.o. year old female who presented to the ED tonight with Chest Pain     H/o CAD s/p stent. Here with pain that woke her up. ECG ok. Troponin negative. Toradol helped.  Awaiting repeat ecg and troponin. Follow up CTA.  ganji and NSAIDs if all ok.   Has Appointment with Dr. Jacinto Halim tomorrow. Will d/w him about taking mobic.   Discharge instructions, including strict return precautions for new or worsening symptoms, given. Patient and/or family verbalized understanding and agreement with the plan as described.   Labs, studies and imaging reviewed by myself and considered in medical decision making if ordered. Imaging interpreted by radiology.  Labs Reviewed  BASIC METABOLIC PANEL - Abnormal; Notable for the following:       Result Value   Glucose, Bld 104 (*)    All other components within normal limits  CBC - Abnormal; Notable for the following:    Hemoglobin 11.5 (*)    HCT 35.7 (*)    All other components within normal limits  D-DIMER, QUANTITATIVE (NOT AT [facility]) - Abnormal; Notable for the following:    D-Dimer, Quant 1.57 (*)    All other components within normal limits  I-STAT TROPOININ, ED  I-STAT TROPOININ, ED    CT Angio Chest PE W and/or Wo Contrast  Final Result    DG Chest 2 View  Final Result      No Follow-up on file.    Marily Memos, MD 03/20/16 [phone]

## 2016-03-21 DIAGNOSIS — I2510 Atherosclerotic heart disease of native coronary artery without angina pectoris: Secondary | ICD-10-CM | POA: Diagnosis not present

## 2016-03-21 DIAGNOSIS — I252 Old myocardial infarction: Secondary | ICD-10-CM | POA: Diagnosis not present

## 2016-03-28 ENCOUNTER — Encounter (HOSPITAL_COMMUNITY): Payer: BLUE CROSS/BLUE SHIELD

## 2016-04-12 DIAGNOSIS — I2510 Atherosclerotic heart disease of native coronary artery without angina pectoris: Secondary | ICD-10-CM | POA: Diagnosis not present

## 2016-04-12 DIAGNOSIS — R0789 Other chest pain: Secondary | ICD-10-CM | POA: Diagnosis not present

## 2016-04-12 DIAGNOSIS — E7800 Pure hypercholesterolemia, unspecified: Secondary | ICD-10-CM | POA: Diagnosis not present

## 2016-04-12 DIAGNOSIS — F418 Other specified anxiety disorders: Secondary | ICD-10-CM | POA: Diagnosis not present

## 2016-04-16 DIAGNOSIS — R0789 Other chest pain: Secondary | ICD-10-CM | POA: Diagnosis not present

## 2016-04-16 DIAGNOSIS — I2510 Atherosclerotic heart disease of native coronary artery without angina pectoris: Secondary | ICD-10-CM | POA: Diagnosis not present

## 2016-07-03 ENCOUNTER — Observation Stay (HOSPITAL_COMMUNITY)
Admission: EM | Admit: 2016-07-03 | Discharge: 2016-07-04 | Disposition: A | Discharge status: Home / Self Care | Payer: BLUE CROSS/BLUE SHIELD | Attending: Internal Medicine | Admitting: Internal Medicine

## 2016-07-03 ENCOUNTER — Encounter (HOSPITAL_COMMUNITY): Payer: Self-pay | Admitting: *Deleted

## 2016-07-03 ENCOUNTER — Emergency Department (HOSPITAL_COMMUNITY): Payer: BLUE CROSS/BLUE SHIELD

## 2016-07-03 DIAGNOSIS — Z7982 Long term (current) use of aspirin: Secondary | ICD-10-CM | POA: Insufficient documentation

## 2016-07-03 DIAGNOSIS — R072 Precordial pain: Secondary | ICD-10-CM | POA: Diagnosis present

## 2016-07-03 DIAGNOSIS — I2510 Atherosclerotic heart disease of native coronary artery without angina pectoris: Secondary | ICD-10-CM | POA: Diagnosis not present

## 2016-07-03 DIAGNOSIS — D649 Anemia, unspecified: Secondary | ICD-10-CM | POA: Diagnosis present

## 2016-07-03 DIAGNOSIS — I252 Old myocardial infarction: Secondary | ICD-10-CM | POA: Insufficient documentation

## 2016-07-03 DIAGNOSIS — F418 Other specified anxiety disorders: Secondary | ICD-10-CM | POA: Diagnosis present

## 2016-07-03 DIAGNOSIS — R079 Chest pain, unspecified: Secondary | ICD-10-CM

## 2016-07-03 DIAGNOSIS — R0602 Shortness of breath: Secondary | ICD-10-CM | POA: Diagnosis not present

## 2016-07-03 DIAGNOSIS — R05 Cough: Secondary | ICD-10-CM | POA: Diagnosis not present

## 2016-07-03 DIAGNOSIS — R0789 Other chest pain: Secondary | ICD-10-CM | POA: Diagnosis not present

## 2016-07-03 DIAGNOSIS — Z79899 Other long term (current) drug therapy: Secondary | ICD-10-CM | POA: Insufficient documentation

## 2016-07-03 DIAGNOSIS — Z87891 Personal history of nicotine dependence: Secondary | ICD-10-CM | POA: Insufficient documentation

## 2016-07-03 DIAGNOSIS — R071 Chest pain on breathing: Secondary | ICD-10-CM | POA: Diagnosis present

## 2016-07-03 HISTORY — DX: Depression, unspecified: F32.A

## 2016-07-03 HISTORY — DX: Major depressive disorder, single episode, unspecified: F32.9

## 2016-07-03 HISTORY — DX: Acute myocardial infarction, unspecified: I21.9

## 2016-07-03 LAB — BASIC METABOLIC PANEL
Anion gap: 7 (ref 5–15)
BUN: 12 mg/dL (ref 6–20)
CO2: 20 mmol/L — AB (ref 22–32)
Calcium: 8.9 mg/dL (ref 8.9–10.3)
Chloride: 113 mmol/L — ABNORMAL HIGH (ref 101–111)
Creatinine, Ser: 0.79 mg/dL (ref 0.44–1.00)
GFR calc Af Amer: >60 mL/min (ref >60)
GLUCOSE: 101 mg/dL — AB (ref 65–99)
POTASSIUM: 3.7 mmol/L (ref 3.5–5.1)
Sodium: 140 mmol/L (ref 135–145)

## 2016-07-03 LAB — CBC WITH DIFFERENTIAL/PLATELET
Basophils Absolute: 0 10*3/uL (ref 0.0–0.1)
Basophils Relative: 0 %
EOS PCT: 5 %
Eosinophils Absolute: 0.2 10*3/uL (ref 0.0–0.7)
HEMATOCRIT: 35.4 % — AB (ref 36.0–46.0)
Hemoglobin: 11.6 g/dL — ABNORMAL LOW (ref 12.0–15.0)
LYMPHS ABS: 1 10*3/uL (ref 0.7–4.0)
LYMPHS PCT: 20 %
MCH: 29.7 pg (ref 26.0–34.0)
MCHC: 32.8 g/dL (ref 30.0–36.0)
MCV: 90.8 fL (ref 78.0–100.0)
MONO ABS: 0.3 10*3/uL (ref 0.1–1.0)
Monocytes Relative: 5 %
Neutro Abs: 3.5 10*3/uL (ref 1.7–7.7)
Neutrophils Relative %: 70 %
PLATELETS: 183 10*3/uL (ref 150–400)
RBC: 3.9 MIL/uL (ref 3.87–5.11)
RDW: 13.8 % (ref 11.5–15.5)
WBC: 5.1 10*3/uL (ref 4.0–10.5)

## 2016-07-03 LAB — I-STAT TROPONIN, ED
TROPONIN I, POC: 0 ng/mL (ref 0.00–0.08)
Troponin i, poc: 0 ng/mL (ref 0.00–0.08)

## 2016-07-03 LAB — I-STAT CG4 LACTIC ACID, ED
LACTIC ACID, VENOUS: 0.96 mmol/L (ref 0.5–1.9)
LACTIC ACID, VENOUS: 0.36 mmol/L — AB (ref 0.5–1.9)

## 2016-07-03 LAB — TROPONIN I

## 2016-07-03 LAB — PROTIME-INR
INR: 1.11
Prothrombin Time: 14.3 seconds (ref 11.4–15.2)

## 2016-07-03 LAB — BRAIN NATRIURETIC PEPTIDE: B Natriuretic Peptide: 45 pg/mL (ref 0.0–100.0)

## 2016-07-03 MED ORDER — MORPHINE SULFATE (PF) 4 MG/ML IV SOLN
4.0000 mg | Freq: Once | INTRAVENOUS | Status: AC
  Administered 2016-07-03: 4 mg via INTRAVENOUS
  Filled 2016-07-03: qty 1

## 2016-07-03 MED ORDER — SODIUM CHLORIDE 0.9 % IV BOLUS (SEPSIS)
1000.0000 mL | Freq: Once | INTRAVENOUS | Status: AC
  Administered 2016-07-03: 1000 mL via INTRAVENOUS

## 2016-07-03 MED ORDER — CARVEDILOL 3.125 MG PO TABS
3.1250 mg | ORAL_TABLET | Freq: Two times a day (BID) | ORAL | Status: DC
  Administered 2016-07-04: 3.125 mg via ORAL
  Filled 2016-07-03: qty 1

## 2016-07-03 MED ORDER — ENOXAPARIN SODIUM 40 MG/0.4ML ~~LOC~~ SOLN
40.0000 mg | SUBCUTANEOUS | Status: DC
  Administered 2016-07-03: 40 mg via SUBCUTANEOUS
  Filled 2016-07-03: qty 0.4

## 2016-07-03 MED ORDER — ASPIRIN EC 81 MG PO TBEC
81.0000 mg | DELAYED_RELEASE_TABLET | Freq: Every day | ORAL | Status: DC
  Administered 2016-07-04: 81 mg via ORAL
  Filled 2016-07-03: qty 1

## 2016-07-03 MED ORDER — ONDANSETRON HCL 4 MG/2ML IJ SOLN: 4.0000 mg | Freq: Four times a day (QID) | INTRAMUSCULAR | Status: DC | PRN

## 2016-07-03 MED ORDER — FLUOCINONIDE-E 0.05 % EX CREA
1.0000 "application " | TOPICAL_CREAM | Freq: Two times a day (BID) | CUTANEOUS | Status: DC | PRN
  Filled 2016-07-03: qty 15

## 2016-07-03 MED ORDER — ALPRAZOLAM 0.5 MG PO TABS
1.0000 mg | ORAL_TABLET | Freq: Every day | ORAL | Status: DC
  Administered 2016-07-03: 1 mg via ORAL
  Filled 2016-07-03: qty 2

## 2016-07-03 MED ORDER — ESCITALOPRAM OXALATE 10 MG PO TABS
20.0000 mg | ORAL_TABLET | Freq: Every day | ORAL | Status: DC
  Administered 2016-07-04: 20 mg via ORAL
  Filled 2016-07-03: qty 2

## 2016-07-03 MED ORDER — TRAMADOL HCL 50 MG PO TABS
100.0000 mg | ORAL_TABLET | Freq: Four times a day (QID) | ORAL | Status: DC | PRN
  Administered 2016-07-04: 100 mg via ORAL
  Filled 2016-07-03: qty 2

## 2016-07-03 MED ORDER — NITROGLYCERIN 0.4 MG SL SUBL: 0.4000 mg | SUBLINGUAL_TABLET | SUBLINGUAL | Status: DC | PRN

## 2016-07-03 MED ORDER — ASPIRIN 81 MG PO TBEC
81.0000 mg | DELAYED_RELEASE_TABLET | Freq: Every day | ORAL | Status: DC
Start: 2016-07-04 — End: 2016-07-03

## 2016-07-03 MED ORDER — TICAGRELOR 90 MG PO TABS
90.0000 mg | ORAL_TABLET | Freq: Two times a day (BID) | ORAL | Status: DC
  Administered 2016-07-03 – 2016-07-04 (×2): 90 mg via ORAL
  Filled 2016-07-03 (×2): qty 1

## 2016-07-03 MED ORDER — ACETAMINOPHEN 500 MG PO TABS: 1000.0000 mg | ORAL_TABLET | Freq: Four times a day (QID) | ORAL | Status: DC | PRN

## 2016-07-03 MED ORDER — ATORVASTATIN CALCIUM 80 MG PO TABS: 80.0000 mg | ORAL_TABLET | Freq: Every day | ORAL | Status: DC

## 2016-07-03 NOTE — ED Notes (Signed)
Patient transported to X-ray 

## 2016-07-03 NOTE — ED Triage Notes (Signed)
Per [facility] Pt had a MI in July, had a stent placed here. Was driving home last night at 10:30 CP began & radiated down left arm. General weakness and SOB. CP was described as tightness & Burning down Left arm. 1 Nitro with no changes. 8 out of 10 left arm pain. Chest pain is a 5 out of 10.  Vs are as follows: BP: 100/62 HR: 64 Resp: 18 SPO2: 96% on RA

## 2016-07-03 NOTE — ED Notes (Signed)
Report attempted 

## 2016-07-03 NOTE — ED Notes (Addendum)
Pt c/o pain at left chest /lateral ribs increased to 10/10. MD informed.

## 2016-07-03 NOTE — ED Provider Notes (Signed)
[facility]-EMERGENCY DEPT Provider Note   CSN: [id number] Arrival date & time: 07/03/16  1439      History   Chief Complaint Chief Complaint  Patient presents with  . Chest Pain    HPI Carolyn Gaines is a 50 y.o. female with a past medical history significant for CAD status post PCI this year who presents with chest pain. Patient reports that she had a stent placed several months ago at this facility and is currently on aspirin and Brilinta. She reports that last night around 10:30 PM, she was driving home from work when she began having chest pain. She describes it as a tightness and pressure. She says the pain raise down her left arm and a burning sensation. She reports fatigue and lightheadedness. Of note, patient reports that this feels "the same as my heart attack". She reports that last night she took 2 nitroglycerin which improved her pain from a 8 out of 10 severity down to a 4 out of 10. She reports that this morning, she continued to have chest pain at work prompting her to be sent to the ED. Patient reports that she was given aspirin and one more nitroglycerin by EMS. She reports her pain again slightly improved and is currently at a 7 out of 10 severity. He denies diaphoresis, nausea, or vomiting. She reports some chills and a chronic productive cough. She says that she has a "cold I can't shake this month". She denies worsened leg pain, leg swelling, or history of DVT/PE. [place] any recent chest trauma and reports that the pain is improved when she clutches her chest.    The history is provided by the patient and medical records. No language interpreter was used.  Chest Pain   This is a new problem. The current episode started 12 to 24 hours ago. The problem occurs constantly. The problem has been gradually worsening. The pain is associated with exertion. The pain is present in the substernal region. The pain is at a severity of 7/10. The pain is moderate. The quality of the pain is  described as exertional and pressure-like. The pain radiates to the left shoulder and left arm. Duration of episode(s) is 18 hours. The symptoms are aggravated by exertion. Associated symptoms include cough and shortness of breath. Pertinent negatives include no abdominal pain, no back pain, no diaphoresis, no dizziness, no fever, no headaches, no leg pain, no lower extremity edema, no nausea, no numbness, no palpitations, no vomiting and no weakness. She has tried nitroglycerin for the symptoms. The treatment provided moderate relief.  Her past medical history is significant for CAD and MI.    Past Medical History:  Diagnosis Date  . Anxiety   . Arthritis   . Depression   . Fibromyalgia   . MI (myocardial infarction) 01/2016    Patient Active Problem List   Diagnosis Date Noted  . Acute MI, inferior wall, initial episode of care (HCC) 02/19/2016  . CAD (coronary artery disease), native coronary artery 02/19/2016    Past Surgical History:  Procedure Laterality Date  . ABDOMINAL HYSTERECTOMY    . CARDIAC CATHETERIZATION N/A 02/19/2016   Procedure: Left Heart Cath and Coronary Angiography;  Surgeon: Yates Decamp, MD;  Location: [facility] INVASIVE CV LAB;  Service: Cardiovascular;  Laterality: N/A;  . CARDIAC CATHETERIZATION N/A 02/19/2016   Procedure: Coronary Stent Intervention;  Surgeon: Yates Decamp, MD;  Location: [facility] INVASIVE CV LAB;  Service: Cardiovascular;  Laterality: N/A;  . cesarian  OB History    No data available       Home Medications    Prior to Admission medications   Medication Sig Start Date End Date Taking? Authorizing Provider  aspirin EC 81 MG EC tablet Take 1 tablet (81 mg total) by mouth daily. 02/20/16   Marcy Salvo, NP  aspirin-acetaminophen-caffeine (EXCEDRIN MIGRAINE) [phone] MG tablet Take 2 tablets by mouth 3 (three) times daily as needed for migraine.    Historical Provider, MD  atorvastatin (LIPITOR) 80 MG tablet Take 1 tablet (80 mg total) by mouth  daily at 6 PM. 02/20/16   Marcy Salvo, NP  carvedilol (COREG) 3.125 MG tablet Take 3.125 mg by mouth 2 (two) times daily with a meal.    Historical Provider, MD  escitalopram (LEXAPRO) 10 MG tablet Take 1 tablet (10 mg total) by mouth daily. Patient taking differently: Take 20 mg by mouth daily.  02/21/16   Yates Decamp, MD  fluocinonide-emollient (LIDEX-E) 0.05 % cream Apply 1 application topically 2 (two) times daily as needed (psoriasis).    Historical Provider, MD  meloxicam (MOBIC) 7.5 MG tablet Take 1 tablet (7.5 mg total) by mouth daily. 03/20/16   Marily Memos, MD  nitroGLYCERIN (NITROSTAT) 0.4 MG SL tablet Place 1 tablet (0.4 mg total) under the tongue every 5 (five) minutes x 3 doses as needed for chest pain. 02/20/16   Marcy Salvo, NP  ticagrelor (BRILINTA) 90 MG TABS tablet Take 1 tablet (90 mg total) by mouth 2 (two) times daily. 02/20/16   Marcy Salvo, NP  traMADol (ULTRAM) 50 MG tablet Take 100-150 mg by mouth every 4 (four) hours as needed (pain).    Historical Provider, MD    Family History No family history on file.  Social History Social History  Substance Use Topics  . Smoking status: Former Smoker    Quit date: 03/21/2011  . Smokeless tobacco: Never Used  . Alcohol use No     Allergies   Adhesive [tape]; Bee venom; and Triple antibiotic [bacitracin-neomycin-polymyxin]   Review of Systems Review of Systems  Constitutional: Positive for chills and fatigue. Negative for activity change, appetite change, diaphoresis and fever.  HENT: Negative for congestion and rhinorrhea.   Eyes: Negative for visual disturbance.  Respiratory: Positive for cough, chest tightness and shortness of breath. Negative for wheezing and stridor.   Cardiovascular: Positive for chest pain. Negative for palpitations and leg swelling.  Gastrointestinal: Negative for abdominal distention, abdominal pain, constipation, diarrhea, nausea and vomiting.  Genitourinary: Negative for  difficulty urinating, dysuria, flank pain, frequency, hematuria, menstrual problem, pelvic pain, vaginal bleeding and vaginal discharge.  Musculoskeletal: Negative for back pain and neck pain.  Skin: Negative for rash and wound.  Neurological: Negative for dizziness, weakness, light-headedness, numbness and headaches.  Psychiatric/Behavioral: Negative for agitation and confusion.  All other systems reviewed and are negative.    Physical Exam Updated Vital Signs BP 96/64 (BP Location: Right Arm)   Pulse 62   Temp 97.9 F (36.6 C) (Oral)   Resp 19   Ht 5\' 4"  (1.626 m)   Wt 160 lb (72.6 kg)   SpO2 96%   BMI 27.46 kg/m   Physical Exam  Constitutional: She appears well-developed and well-nourished. No distress.  HENT:  Head: Normocephalic and atraumatic.  Mouth/Throat: Oropharynx is clear and moist.  Eyes: Conjunctivae and EOM are normal. Pupils are equal, round, and reactive to light.  Neck: Normal range of motion. Neck supple.  Cardiovascular: Normal rate and regular rhythm.  No murmur heard. Pulmonary/Chest: Effort normal and breath sounds normal. No stridor. No respiratory distress. She has no wheezes. She has no rales. She exhibits no tenderness.  Abdominal: Soft. There is no tenderness.  Musculoskeletal: She exhibits no edema or tenderness.  Neurological: She is alert. No sensory deficit.  Skin: Skin is warm and dry. Capillary refill takes less than 2 seconds. No rash noted. No erythema.  Psychiatric: She has a normal mood and affect.  Nursing note and vitals reviewed.    ED Treatments / Results  Labs (all labs ordered are listed, but only abnormal results are displayed) Labs Reviewed  CBC WITH DIFFERENTIAL/PLATELET - Abnormal; Notable for the following:       Result Value   Hemoglobin 11.6 (*)    HCT 35.4 (*)    All other components within normal limits  BASIC METABOLIC PANEL - Abnormal; Notable for the following:    Chloride 113 (*)    CO2 20 (*)    Glucose,  Bld 101 (*)    All other components within normal limits  I-STAT CG4 LACTIC ACID, ED - Abnormal; Notable for the following:    Lactic Acid, Venous 0.36 (*)    All other components within normal limits  PROTIME-INR  BRAIN NATRIURETIC PEPTIDE  TROPONIN I  TROPONIN I  TROPONIN I  BASIC METABOLIC PANEL  I-STAT TROPOININ, ED  I-STAT CG4 LACTIC ACID, ED  I-STAT TROPOININ, ED    EKG  EKG Interpretation  Date/Time:  Tuesday July 03 2016 14:52:09 EST Ventricular Rate:  57 PR Interval:    QRS Duration: 82 QT Interval:  412 QTC Calculation: 402 R Axis:   73 Text Interpretation:  Sinus rhythm When compared to prior, no significant change was seen.  No STEMI Confirmed by Rush LandmarkEGELER MD, Joyceann Kruser [phone]) on 07/03/2016 3:31:59 PM       Radiology Dg Chest 2 View  Result Date: 07/03/2016 CLINICAL DATA:  Left-sided chest pain and pain the medial left breast radiating down left arm since last night. Productive cough. EXAM: CHEST  2 VIEW COMPARISON:  03/20/2016 CXR and CT FINDINGS: The heart size and mediastinal contours are within normal limits. Minimal blunting of the left costophrenic angle may represent a trace effusion or pleural thickening. Likely pleural thickening based on prior CT. Both lungs are clear. The visualized skeletal structures are unremarkable. IMPRESSION: No active cardiopulmonary disease. Minimal blunting of the left costophrenic angle may represent a trace effusion or slight pleural thickening, favor slight pleural thickening based on prior CT. Electronically Signed   By: Tollie Ethavid  Kwon M.D.   On: 07/03/2016 16:43    Procedures Procedures (including critical care time)  Medications Ordered in ED Medications  acetaminophen (TYLENOL) tablet 1,000 mg (not administered)  ALPRAZolam (XANAX) tablet 1 mg (1 mg Oral Given 07/03/16 2217)  escitalopram (LEXAPRO) tablet 20 mg (not administered)  carvedilol (COREG) tablet 3.125 mg (not administered)  atorvastatin (LIPITOR) tablet  80 mg (not administered)  fluocinonide-emollient (LIDEX-E) 0.05 % cream 1 application (not administered)  ticagrelor (BRILINTA) tablet 90 mg (90 mg Oral Given 07/03/16 2217)  traMADol (ULTRAM) tablet 100 mg (not administered)  nitroGLYCERIN (NITROSTAT) SL tablet 0.4 mg (not administered)  ondansetron (ZOFRAN) injection 4 mg (not administered)  enoxaparin (LOVENOX) injection 40 mg (40 mg Subcutaneous Given 07/03/16 2223)  aspirin EC tablet 81 mg (not administered)  sodium chloride 0.9 % bolus 1,000 mL (0 mLs Intravenous Stopped 07/03/16 1943)  morphine 4 MG/ML injection 4 mg (4 mg Intravenous Given 07/03/16 1823)  Initial Impression / Assessment and Plan / ED Course  I have reviewed the triage vital signs and the nursing notes.  Pertinent labs & imaging results that were available during my care of the patient were reviewed by me and considered in my medical decision making (see chart for details).  Clinical Course     Carolyn Gaines is a 50 y.o. female with a past medical history significant for CAD status post PCI this year who presents with chest pain.  History and exam are seen above.  On exam, patient has clear lungs, nontender chest. Abdomen is nontender. Pulses are symmetric in all extremities. No unilateral leg swelling or significant edema. No back tenderness.   Based on patient's history of prior MI with similar presentation, suspect patient will need workup to exclude recurrent ACS. Patient had EKG performed in triage which did not show any acute signs of ischemia.  Patient will have x-ray, laboratory testing including troponins, and we given fluids. Patient's blood pressure is in the 90s on arrival. Patient does report that she has not been eating and drinking as well recently so there may be a component of dehydration. Anticipate speaking with cardiology after laboratory testing completed. Suspect patient will require admission.  Laboratory testing and workup results are seen  above.  His troponin negative, BNP nonelevated, no leukocytosis, and Amy appears similar to prior. Chest x-ray shows no evidence of pneumonia.   Look pressure improved with fluids. Patient will be given morphine for pain control.  Dr. Jacinto Halim, the patient's cardiologist was called and he requests patient be admitted to the hospital and he will see the patient in the morning for further management. Do not feel the patient required emergent heart catheterization and needs to be observed overnight  Patient will be admitted to hospitalist service for further management. Patient admitted in stable condition.  Final Clinical Impressions(s) / ED Diagnoses   Final diagnoses:  Chest pain, unspecified type    Clinical Impression: 1. Chest pain, unspecified type     Disposition: Admit to Hospitalist service    Heide Scales, MD 07/04/16 [phone]

## 2016-07-03 NOTE — H&P (Signed)
History and Physical    Carolyn Gaines YCX:[id number] DOB: [birth year] DOA: 07/03/2016  PCP: Pcp Not In System   Patient coming from: Home  Chief Complaint: Chest pain   HPI: Carolyn Gaines is a 50 y.o. female with medical history significant for coronary artery disease with stent placed to RCA in July 2017, depression, anxiety, and psoriasis who presents to the ED with chest pain that began at rest last night. Patient reports developing an acute URI approximately one month ago and is still dealing with a residual cough. She had otherwise been in her usual state of health and had not experienced any anginal symptoms since the time of her MI in July of this year. She was driving home from work last night when she experienced sudden onset of severe pain in the central chest, described as a tightness, radiating down the left arm, and associated with mild dyspnea. She took 2 doses of nitroglycerin at home last night with some mild improvement in her symptoms. She reports that the chest pain persisted throughout the night and her husband was encouraging her to present to the emergency department, but she insisted on waiting. Today at work, she complained of ongoing chest pain to coworkers who insisted that she be evaluated and called 911. Patient was given 1 dose of nitroglycerin and 324 mg aspirin en route to the hospital and reports no significant change in her symptoms with these medications.   ED Course: Upon arrival to the ED, patient is found to be afebrile, saturating adequately on room air, bradycardic in the high 50s, and with soft but stable blood pressure. EKG features a sinus rhythm with no significant change from prior and chest x-ray is negative for acute cardiopulmonary disease. Chemistry panel is largely unremarkable and CBC is notable for stable normocytic anemia with hemoglobin of 11.6. Lactic acid is normal, as his INR and BNP. Troponin is undetectable. Cardiology was consulted by the ED  physician and a medical admission for ACS rule out was advised. Cardiology is indicated that they will see the patient in the morning. Patient was treated with 1 L of normal saline and 4 mg of IV morphine in the ED and enjoyed resolution of her chest pain with this. She will be observed on the telemetry unit for ongoing evaluation and management of chest pain in a patient with history of CAD.  Review of Systems:  All other systems reviewed and apart from HPI, are negative.  Past Medical History:  Diagnosis Date  . Anxiety   . Arthritis   . Depression   . Fibromyalgia   . MI (myocardial infarction) 01/2016    Past Surgical History:  Procedure Laterality Date  . ABDOMINAL HYSTERECTOMY    . CARDIAC CATHETERIZATION N/A 02/19/2016   Procedure: Left Heart Cath and Coronary Angiography;  Surgeon: Yates Decamp, MD;  Location: [facility] INVASIVE CV LAB;  Service: Cardiovascular;  Laterality: N/A;  . CARDIAC CATHETERIZATION N/A 02/19/2016   Procedure: Coronary Stent Intervention;  Surgeon: Yates Decamp, MD;  Location: [facility] INVASIVE CV LAB;  Service: Cardiovascular;  Laterality: N/A;  . cesarian       reports that she quit smoking about 5 years ago. She has never used smokeless tobacco. She reports that she does not drink alcohol or use drugs.  Allergies  Allergen Reactions  . Bee Venom Other (See Comments)    Pus sites at injection  . Adhesive [Tape] Itching and Swelling    Please use paper tape  . Triple  Antibiotic [Bacitracin-Neomycin-Polymyxin] Rash    History reviewed. No pertinent family history.   Prior to Admission medications   Medication Sig Start Date End Date Taking? Authorizing Provider  acetaminophen (TYLENOL) 500 MG tablet Take 1,000 mg by mouth every 6 (six) hours as needed for headache.   Yes Historical Provider, MD  ALPRAZolam Prudy Feeler) 1 MG tablet Take 1 mg by mouth at bedtime. 06/26/16  Yes Historical Provider, MD  aspirin EC 81 MG EC tablet Take 1 tablet (81 mg total) by mouth daily.  02/20/16  Yes Marcy Salvo, NP  atorvastatin (LIPITOR) 80 MG tablet Take 1 tablet (80 mg total) by mouth daily at 6 PM. 02/20/16  Yes Marcy Salvo, NP  carvedilol (COREG) 3.125 MG tablet Take 3.125 mg by mouth 2 (two) times daily with a meal.   Yes Historical Provider, MD  escitalopram (LEXAPRO) 20 MG tablet Take 20 mg by mouth daily. 03/31/16  Yes Historical Provider, MD  fluocinonide-emollient (LIDEX-E) 0.05 % cream Apply 1 application topically 2 (two) times daily as needed (psoriasis).   Yes Historical Provider, MD  nitroGLYCERIN (NITROSTAT) 0.4 MG SL tablet Place 1 tablet (0.4 mg total) under the tongue every 5 (five) minutes x 3 doses as needed for chest pain. 02/20/16  Yes Marcy Salvo, NP  ticagrelor (BRILINTA) 90 MG TABS tablet Take 1 tablet (90 mg total) by mouth 2 (two) times daily. 02/20/16  Yes Marcy Salvo, NP  traMADol (ULTRAM) 50 MG tablet Take 100-150 mg by mouth 2 (two) times daily.    Yes Historical Provider, MD    Physical Exam: Vitals:   07/03/16 1845 07/03/16 1900 07/03/16 1915 07/03/16 1930  BP: 109/58 111/68 110/66 105/64  Pulse: 63 65 64 (!) 59  Resp: 18 16 15 15   Temp:      TempSrc:      SpO2: 97% 98% 97% 96%  Weight:      Height:          Constitutional: NAD, calm, comfortable Eyes: PERTLA, lids and conjunctivae normal ENMT: Mucous membranes are moist. Posterior pharynx clear of any exudate or lesions.   Neck: normal, supple, no masses, no thyromegaly Respiratory: clear to auscultation bilaterally, no wheezing, no crackles. Normal respiratory effort.   Cardiovascular: S1 & S2 heard, regular rate and rhythm. No extremity edema. No significant JVD. Abdomen: No distension, no tenderness, no masses palpated. Bowel sounds normal.  Musculoskeletal: no clubbing / cyanosis. No joint deformity upper and lower extremities. Normal muscle tone.  Skin: no significant rashes, lesions, ulcers. Warm, dry, well-perfused. Neurologic: CN 2-12 grossly intact.  Sensation intact, DTR normal. Strength 5/5 in all 4 limbs.  Psychiatric: Normal judgment and insight. Alert and oriented x 3. Anxious.     Labs on Admission: I have personally reviewed following labs and imaging studies  CBC:  Recent Labs Lab 07/03/16 1535  WBC 5.1  NEUTROABS 3.5  HGB 11.6*  HCT 35.4*  MCV 90.8  PLT 183   Basic Metabolic Panel:  Recent Labs Lab 07/03/16 1535  NA 140  K 3.7  CL 113*  CO2 20*  GLUCOSE 101*  BUN 12  CREATININE 0.79  CALCIUM 8.9   GFR: Estimated Creatinine Clearance: 81.3 mL/min (by C-G formula based on SCr of 0.79 mg/dL). Liver Function Tests: No results for input(s): AST, ALT, ALKPHOS, BILITOT, PROT, ALBUMIN in the last 168 hours. No results for input(s): LIPASE, AMYLASE in the last 168 hours. No results for input(s): AMMONIA in the last 168 hours. Coagulation Profile:  Recent Labs  Lab 07/03/16 1535  INR 1.11   Cardiac Enzymes: No results for input(s): CKTOTAL, CKMB, CKMBINDEX, TROPONINI in the last 168 hours. BNP (last 3 results) No results for input(s): PROBNP in the last 8760 hours. HbA1C: No results for input(s): HGBA1C in the last 72 hours. CBG: No results for input(s): GLUCAP in the last 168 hours. Lipid Profile: No results for input(s): CHOL, HDL, LDLCALC, TRIG, CHOLHDL, LDLDIRECT in the last 72 hours. Thyroid Function Tests: No results for input(s): TSH, T4TOTAL, FREET4, T3FREE, THYROIDAB in the last 72 hours. Anemia Panel: No results for input(s): VITAMINB12, FOLATE, FERRITIN, TIBC, IRON, RETICCTPCT in the last 72 hours. Urine analysis: No results found for: COLORURINE, APPEARANCEUR, LABSPEC, PHURINE, GLUCOSEU, HGBUR, BILIRUBINUR, KETONESUR, PROTEINUR, UROBILINOGEN, NITRITE, LEUKOCYTESUR Sepsis Labs: @LABRCNTIP (procalcitonin:4,lacticidven:4) )No results found for this or any previous visit (from the past 240 hour(s)).   Radiological Exams on Admission: Dg Chest 2 View  Result Date: 07/03/2016 CLINICAL DATA:   Left-sided chest pain and pain the medial left breast radiating down left arm since last night. Productive cough. EXAM: CHEST  2 VIEW COMPARISON:  03/20/2016 CXR and CT FINDINGS: The heart size and mediastinal contours are within normal limits. Minimal blunting of the left costophrenic angle may represent a trace effusion or pleural thickening. Likely pleural thickening based on prior CT. Both lungs are clear. The visualized skeletal structures are unremarkable. IMPRESSION: No active cardiopulmonary disease. Minimal blunting of the left costophrenic angle may represent a trace effusion or slight pleural thickening, favor slight pleural thickening based on prior CT. Electronically Signed   By: Tollie Ethavid  Kwon M.D.   On: 07/03/2016 16:43    EKG: Independently reviewed. Sinus rhythm, no significant change from prior  Assessment/Plan  1. Chest pain, CAD  - Pt has hx of CAD followed by cardiology with DES placed to RCA in July 2017; she was noted to have diffuse LAD disease also at that time and has been managed with Coreg, Lipitor, Brillinta, and ASA - She now presents with CP that began at rest the night prior and described as similar to prior MI, but more severe  - She reports mild improvement with NTG x2 at home the night prior to admission, but no response to NTG x1 given en route to hospital  - ASA 324 mg was given PTA  - Initial EKG appears unchanged from prior with no acute ischemic features; initial troponin is reassuring at 0.00  - Cardiology was consulted by ED physician, advised a medical admission for ACS r/o, and indicated that they will see pt in am  - Plan to monitor on telemetry, obtain serial troponin measurements, and serial EKGs - Continue ASA, Brillinta, Lipitor; Coreg held on admission d/t bradycardia and soft BP, will resume in am with holding parameters  2. Depression, anxiety  - Stable, continue Lexapro and Xanax   3. Normocytic anemia  - Hgb is 11.6 on admission, stable relative  to priors, and with no evidence for bleed     DVT prophylaxis: sq Lovenox  Code Status: Full  Family Communication: Discussed with patient Disposition Plan: Observe on telemetry Consults called: Cardiology Admission status: Observation    Briscoe Deutscherimothy S Cem Kosman, MD [facility] Pager [phone]  If 7PM-7AM, please contact night-coverage [email or web address] Password [email or web address]  07/03/2016, 7:56 PM

## 2016-07-03 NOTE — Consult Note (Signed)
CARDIOLOGY CONSULT NOTE  Patient ID: Carolyn Gaines MRN: [id number] DOB/AGE: September 21, 1965 50 y.o.  Admit date: 07/03/2016 Referring Physician  [facility] Primary Physician:  Pcp Not In System Reason for Consultation  Chest pain  HPI: Carolyn Gaines  is a 50 y.o. female  With history of acute inferior STEMI on 02/19/2016 for which underwent emergent PCI to the right coronary artery with implantation of drug-eluting stent. She hasn't residual 60-70% mid LAD stenosis. Other history includes anxiety, depression, fibromyalgia, osteoarthritis.  I had last seen her on 04/16/2016, had been doing well. She is now admitted to the hospital with chest tightness and burning sensation associated with burning sensation in her left arm.  On further questioning vision cement started around 10:30 last night while she was driving home from work. Since then it has been continuous and it is under the left breast, worse on taking deep breath and feels better when she presses with the hand. But she has had continual burning sensation in the left hand. She states that she is able to do all activities, had been at work and in fact this morning woke up in spite of symptoms wanted to go to work but at workplace patient was forced to go to the emergency room.  She states that raising her left arm makes her pain worse especially burning sensation in the left arm.  She is to smoke cigarettes the past occasionally, which is completely quit. She has lost weight and Asmanex as is irregularly. She did take several nitroglycerin but did not help much with regard to chest pain. She was given narcotics in the hospital, states that chest pain is gone but she feels numb all over. No other associated symptoms of chest pain.  Past Medical History:  Diagnosis Date  . Anxiety   . Arthritis   . Depression   . Fibromyalgia   . MI (myocardial infarction) 01/2016     Past Surgical History:  Procedure Laterality Date  . ABDOMINAL HYSTERECTOMY     . CARDIAC CATHETERIZATION N/A 02/19/2016   Procedure: Left Heart Cath and Coronary Angiography;  Surgeon: Yates Decamp, MD;  Location: [facility] INVASIVE CV LAB;  Service: Cardiovascular;  Laterality: N/A;  . CARDIAC CATHETERIZATION N/A 02/19/2016   Procedure: Coronary Stent Intervention;  Surgeon: Yates Decamp, MD;  Location: [facility] INVASIVE CV LAB;  Service: Cardiovascular;  Laterality: N/A;  . cesarian       History reviewed. No pertinent family history. There is no immature coronary artery disease in her family.  Social History: Social History   Social History  . Marital status: Married    Spouse name: N/A  . Number of children: N/A  . Years of education: N/A   Occupational History  . Not on file.   Social History Main Topics  . Smoking status: Former Smoker    Quit date: 03/21/2011  . Smokeless tobacco: Never Used  . Alcohol use No  . Drug use: No  . Sexual activity: Not on file   Other Topics Concern  . Not on file   Social History Narrative  . No narrative on file     Prescriptions Prior to Admission  Medication Sig Dispense Refill Last Dose  . acetaminophen (TYLENOL) 500 MG tablet Take 1,000 mg by mouth every 6 (six) hours as needed for headache.   07/03/2016 at Unknown time  . ALPRAZolam (XANAX) 1 MG tablet Take 1 mg by mouth at bedtime.  0 07/02/2016 at Unknown time  . aspirin EC 81  MG EC tablet Take 1 tablet (81 mg total) by mouth daily. 30 tablet 11 07/03/2016 at Unknown time  . atorvastatin (LIPITOR) 80 MG tablet Take 1 tablet (80 mg total) by mouth daily at 6 PM. 30 tablet 1 07/02/2016 at Unknown time  . carvedilol (COREG) 3.125 MG tablet Take 3.125 mg by mouth 2 (two) times daily with a meal.   07/03/2016 at 1000  . escitalopram (LEXAPRO) 20 MG tablet Take 20 mg by mouth daily.  1 07/03/2016 at Unknown time  . fluocinonide-emollient (LIDEX-E) 0.05 % cream Apply 1 application topically 2 (two) times daily as needed (psoriasis).   prn  . nitroGLYCERIN (NITROSTAT) 0.4 MG SL tablet  Place 1 tablet (0.4 mg total) under the tongue every 5 (five) minutes x 3 doses as needed for chest pain. 25 tablet 1 prn  . ticagrelor (BRILINTA) 90 MG TABS tablet Take 1 tablet (90 mg total) by mouth 2 (two) times daily. 60 tablet 1 07/03/2016 at am  . traMADol (ULTRAM) 50 MG tablet Take 100-150 mg by mouth 2 (two) times daily.    07/02/2016 at Unknown time     ROS: General: no fevers/chills/night sweats Eyes: no blurry vision, diplopia, or amaurosis ENT: no sore throat or hearing loss Resp: Had developed upper respiratory infection 1 month ago, has still been coughing.  CV: no edema or palpitations GI: no abdominal pain, nausea, vomiting, diarrhea, or constipation GU: no dysuria, frequency, or hematuria Skin: no rash Neuro: Numbness, tingling and burning sensation in the left arm. Has had 2 episodes of headache which she states her migraines yesterday and today. She has history of migraines. Musculoskeletal: no joint pain or swelling Heme: no bleeding, DVT, or easy bruising Endo: no polydipsia or polyuria    Physical Exam: Blood pressure (!) 106/57, pulse 60, temperature 98.4 F (36.9 C), temperature source Oral, resp. rate 15, height 5\' 4"  (1.626 m), weight 75.2 kg (165 lb 11.2 oz), SpO2 99 %.   General appearance: alert, cooperative, appears stated age and no distress Lungs: clear to auscultation bilaterally Chest wall: left sided chest wall tenderness Heart: regular rate and rhythm, S1, S2 normal, no murmur, click, rub or gallop Abdomen: soft, non-tender; bowel sounds normal; no masses,  no organomegaly Extremities: extremities normal, atraumatic, no cyanosis or edema Pulses: 2+ and symmetric Neurologic: Grossly normal  Labs:   Lab Results  Component Value Date   WBC 5.1 07/03/2016   HGB 11.6 (L) 07/03/2016   HCT 35.4 (L) 07/03/2016   MCV 90.8 07/03/2016   PLT 183 07/03/2016    Recent Labs Lab 07/03/16 1535  NA 140  K 3.7  CL 113*  CO2 20*  BUN 12  CREATININE  0.79  CALCIUM 8.9  GLUCOSE 101*    Lipid Panel     Component Value Date/Time   CHOL 162 02/19/2016 2017   TRIG 71 02/19/2016 2017   HDL 33 (L) 02/19/2016 2017   CHOLHDL 4.9 02/19/2016 2017   VLDL 14 02/19/2016 2017   LDLCALC 115 (H) 02/19/2016 2017    BNP (last 3 results)  Recent Labs  07/03/16 1535  BNP 45.0    HEMOGLOBIN A1C No results found for: HGBA1C, MPG  Cardiac Panel (last 3 results)  Recent Labs  02/21/16 0930 02/21/16 1243 07/03/16 2027  TROPONINI 2.08* 1.95* <0.03    Lab Results  Component Value Date   TROPONINI <0.03 07/03/2016     TSH  Recent Labs  02/20/16 0312  TSH 0.905    EKG:  normal EKG, normal sinus rhythm, unchanged from previous tracings.  Outpatient Echocardiogram 03/21/2016 Left ventricle cavity is normal in size. Normal global wall motion. Normal diastolic filling pattern. Calculated EF 55%. Left atrial cavity is normal in size. Interatrial septal dropout without obvious PFO or ASD. Trace mitral regurgitation. Trace tricuspid regurgitation. Unable to estimate PA pressure due to absence/minimal TR signal. Essentially normal echocardiogram  Coronary Angiogram 02/19/2016 Mid RCA 99% stenosed S/P 3.0 x 30 mm resolute DES reduced to 0%, TIMI-3 to TIMI-3 flow. Left main ostial 30-40%, mid LAD diffuse long segment disease of 20-30% followed by a focal 60-70% stenosis. Small ramus intermediate and circumflex without significant disease.   Radiology: Dg Chest 2 View  Result Date: 07/03/2016 CLINICAL DATA:  Left-sided chest pain and pain the medial left breast radiating down left arm since last night. Productive cough. EXAM: CHEST  2 VIEW COMPARISON:  03/20/2016 CXR and CT FINDINGS: The heart size and mediastinal contours are within normal limits. Minimal blunting of the left costophrenic angle may represent a trace effusion or pleural thickening. Likely pleural thickening based on prior CT. Both lungs are clear. The visualized skeletal  structures are unremarkable. IMPRESSION: No active cardiopulmonary disease. Minimal blunting of the left costophrenic angle may represent a trace effusion or slight pleural thickening, favor slight pleural thickening based on prior CT. Electronically Signed   By: Tollie Ethavid  Kwon M.D.   On: 07/03/2016 16:43    Scheduled Meds: . ALPRAZolam  1 mg Oral QHS  . [START ON 07/04/2016] aspirin EC  81 mg Oral Daily  . [START ON 07/04/2016] atorvastatin  80 mg Oral q1800  . [START ON 07/04/2016] carvedilol  3.125 mg Oral BID WC  . enoxaparin (LOVENOX) injection  40 mg Subcutaneous Q24H  . [START ON 07/04/2016] escitalopram  20 mg Oral Daily  . ticagrelor  90 mg Oral BID   Continuous Infusions: PRN Meds:.acetaminophen, fluocinonide-emollient, nitroGLYCERIN, ondansetron (ZOFRAN) IV, traMADol  ASSESSMENT AND PLAN:  1. Chest pain is at most atypical and easily reproducible, in spite of chest pain that started at 10:30 last night for almost 24 hours, no EKG abnormalities, cardiac markers are negative myocardial injury. 2. Hyperlipidemia, controlled 3. Coronary artery disease of native vessels, Coronary Angiogram 02/19/2016 Mid RCA 99% stenosed S/P 3.0 x 30 mm resolute DES reduced to 0%, TIMI-3 to TIMI-3 flow. Left main ostial 30-40%, mid LAD diffuse long segment disease of 20-30% followed by a focal 60-70% stenosis. Small ramus intermediate and circumflex without significant disease. 4. Chronic chest pain syndrome, 5. Anxiety.  Recommendation: Patient's symptoms are easily reproducible with a normal EKG in spite of ongoing chest discomfort. She still has crystal disease in LAD, however this should not keep her unstable angina pectoris that is lasting for more than 24 hours. Unless cardiac markers are positive, she can be discharged home in the morning with tramadol and I will see her back in the office. However if she has new EKG abnormalities or if the troponins are positive, then she will certainly need repeat  cardiac catheterization. Patient understands this. I'll see her in the morning.  Yates DecampGANJI, Carolyn Fichter, MD 07/03/2016, 9:31 PM [facility] [place] Pager: [phone] Office: [phone] If no answer Cell [phone]

## 2016-07-03 NOTE — ED Notes (Signed)
EKG given to Dr. Knott 

## 2016-07-04 DIAGNOSIS — I252 Old myocardial infarction: Secondary | ICD-10-CM | POA: Diagnosis not present

## 2016-07-04 DIAGNOSIS — R079 Chest pain, unspecified: Secondary | ICD-10-CM

## 2016-07-04 DIAGNOSIS — I2510 Atherosclerotic heart disease of native coronary artery without angina pectoris: Secondary | ICD-10-CM | POA: Diagnosis not present

## 2016-07-04 DIAGNOSIS — R0602 Shortness of breath: Secondary | ICD-10-CM | POA: Diagnosis not present

## 2016-07-04 DIAGNOSIS — R0789 Other chest pain: Secondary | ICD-10-CM | POA: Diagnosis not present

## 2016-07-04 LAB — TROPONIN I
Troponin I: <0.03 ng/mL (ref <0.03)
Troponin I: <0.03 ng/mL (ref <0.03)

## 2016-07-04 LAB — BASIC METABOLIC PANEL
Anion gap: 3 — ABNORMAL LOW (ref 5–15)
BUN: 8 mg/dL (ref 6–20)
CHLORIDE: 112 mmol/L — AB (ref 101–111)
CO2: 25 mmol/L (ref 22–32)
Calcium: 8.6 mg/dL — ABNORMAL LOW (ref 8.9–10.3)
Creatinine, Ser: 0.84 mg/dL (ref 0.44–1.00)
GFR calc Af Amer: >60 mL/min (ref >60)
GFR calc non Af Amer: >60 mL/min (ref >60)
Glucose, Bld: 85 mg/dL (ref 65–99)
POTASSIUM: 3.7 mmol/L (ref 3.5–5.1)
SODIUM: 140 mmol/L (ref 135–145)

## 2016-07-04 MED ORDER — METHOCARBAMOL 500 MG PO TABS
500.0000 mg | ORAL_TABLET | Freq: Once | ORAL | Status: AC
  Administered 2016-07-04: 500 mg via ORAL
  Filled 2016-07-04: qty 1

## 2016-07-04 MED ORDER — TRAMADOL HCL 50 MG PO TABS: 100.0000 mg | ORAL_TABLET | Freq: Four times a day (QID) | ORAL | 0 refills | Status: DC | PRN

## 2016-07-04 NOTE — Care Management Obs Status (Signed)
MEDICARE OBSERVATION STATUS NOTIFICATION   Patient Details  Name: Carolyn Gaines MRN: [id number] Date of Birth: [birth year]   Medicare Observation Status Notification Given:  Yes    CrutchfieldDerrill Memo, RN 07/04/2016, 10:11 AM

## 2016-07-04 NOTE — Care Management Note (Signed)
Case Management Note  Patient Details  Name: [name] MRN: [id number] Date of Birth: Jun 24, 1966  Subjective/Objective:    50 y.o. F admitted with CP. To be discharged today with no HH needs. Independent at home.                 Action/Plan: CM will sign off but will be available should additional Discharge needs arise   Expected Discharge Date:                  Expected Discharge Plan:  Home/Self Care  In-House Referral:  NA  Discharge planning Services  CM Consult  Post Acute Care Choice:  NA Choice offered to:  Patient  DME Arranged:  N/A DME Agency:  NA  HH Arranged:  NA HH Agency:     Status of Service:  Completed, signed off  If discussed at Long Length of Stay Meetings, dates discussed:    Additional Comments:  Yvone Neu, RN 07/04/2016, 10:14 AM

## 2016-07-04 NOTE — Plan of Care (Signed)
Problem: Education: Goal: Knowledge of [facility] Education information/materials will improve Outcome: Completed/Met Date Met: 07/04/16 Pt has been educated throughout this entire admission regarding tests, procedures, medications, and available resources.   Problem: Safety: Goal: Ability to remain free from injury will improve Outcome: Completed/Met Date Met: 07/04/16 Pt has remained free from injury during this admission   Problem: Fluid Volume: Goal: Ability to maintain a balanced intake and output will improve Outcome: Completed/Met Date Met: 07/04/16 Pt has adequate intake and output

## 2016-07-04 NOTE — Discharge Summary (Signed)
Physician Discharge Summary  Carolyn Glassmanancy R Lienemann ZOX:[id number] DOB: 1966/05/19 DOA: 07/03/2016  PCP: Pcp Not In System  Admit date: 07/03/2016 Discharge date: 07/04/2016   Recommendations for Outpatient Follow-Up:   1. Return to work on 12/9 2. Outpatient stress test   Discharge Diagnosis:   Principal Problem:   Chest pain Active Problems:   CAD (coronary artery disease), native coronary artery   Depression with anxiety   Normocytic anemia   Discharge disposition:  Home.  Discharge Condition: Improved.  Diet recommendation: Low sodium, heart healthy.   Wound care: None.   History of Present Illness:   Carolyn Gaines is a 50 y.o. female with medical history significant for coronary artery disease with stent placed to RCA in July 2017, depression, anxiety, and psoriasis who presents to the ED with chest pain that began at rest last night. Patient reports developing an acute URI approximately one month ago and is still dealing with a residual cough. She had otherwise been in her usual state of health and had not experienced any anginal symptoms since the time of her MI in July of this year. She was driving home from work last night when she experienced sudden onset of severe pain in the central chest, described as a tightness, radiating down the left arm, and associated with mild dyspnea. She took 2 doses of nitroglycerin at home last night with some mild improvement in her symptoms. She reports that the chest pain persisted throughout the night and her husband was encouraging her to present to the emergency department, but she insisted on waiting. Today at work, she complained of ongoing chest pain to coworkers who insisted that she be evaluated and called 911. Patient was given 1 dose of nitroglycerin and 324 mg aspirin en route to the hospital and reports no significant change in her symptoms with these medications.    Hospital Course by Problem:   Chest pain is at most atypical  and easily reproducible, in spite of chest pain for almost 24 hours, no EKG abnormalities, cardiac markers are negative myocardial injury-- outpatient stress test  Hyperlipidemia, controlled  Coronary artery disease of native vessels, Coronary Angiogram 02/19/2016 Mid RCA 99% stenosed S/P 3.0 x 30 mm resolute DES reduced to 0%, TIMI-3 to TIMI-3 flow. Left main ostial 30-40%, mid LAD diffuse long segment disease of 20-30% followed by a focal 60-70% stenosis. Small ramus intermediate and circumflex without significant disease. -outpatient cardiology follow up   Chronic chest pain syndrome -tramadol    Medical Consultants:   cards  Discharge Exam:   Vitals:   07/03/16 2026 07/04/16 0406  BP: (!) 106/57 (!) 104/53  Pulse: 60 (!) 59  Resp:    Temp: 98.4 F (36.9 C) 97.8 F (36.6 C)   Vitals:   07/03/16 1915 07/03/16 1930 07/03/16 2026 07/04/16 0406  BP: 110/66 105/64 (!) 106/57 (!) 104/53  Pulse: 64 (!) 59 60 (!) 59  Resp: 15 15    Temp:   98.4 F (36.9 C) 97.8 F (36.6 C)  TempSrc:   Oral Oral  SpO2: 97% 96% 99% 99%  Weight:   75.2 kg (165 lb 11.2 oz) 75.7 kg (166 lb 12.8 oz)  Height:   5\' 4"  (1.626 m)     Gen:  NAD    The results of significant diagnostics from this hospitalization (including imaging, microbiology, ancillary and laboratory) are listed below for reference.     Procedures and Diagnostic Studies:   Dg Chest 2 View  Result  Date: 07/03/2016 CLINICAL DATA:  Left-sided chest pain and pain the medial left breast radiating down left arm since last night. Productive cough. EXAM: CHEST  2 VIEW COMPARISON:  03/20/2016 CXR and CT FINDINGS: The heart size and mediastinal contours are within normal limits. Minimal blunting of the left costophrenic angle may represent a trace effusion or pleural thickening. Likely pleural thickening based on prior CT. Both lungs are clear. The visualized skeletal structures are unremarkable. IMPRESSION: No active cardiopulmonary  disease. Minimal blunting of the left costophrenic angle may represent a trace effusion or slight pleural thickening, favor slight pleural thickening based on prior CT. Electronically Signed   By: Tollie Eth M.D.   On: 07/03/2016 16:43     Labs:   Basic Metabolic Panel:  Recent Labs Lab 07/03/16 1535 07/04/16 0745  NA 140 140  K 3.7 3.7  CL 113* 112*  CO2 20* 25  GLUCOSE 101* 85  BUN 12 8  CREATININE 0.79 0.84  CALCIUM 8.9 8.6*   GFR Estimated Creatinine Clearance: 78.9 mL/min (by C-G formula based on SCr of 0.84 mg/dL). Liver Function Tests: No results for input(s): AST, ALT, ALKPHOS, BILITOT, PROT, ALBUMIN in the last 168 hours. No results for input(s): LIPASE, AMYLASE in the last 168 hours. No results for input(s): AMMONIA in the last 168 hours. Coagulation profile  Recent Labs Lab 07/03/16 1535  INR 1.11    CBC:  Recent Labs Lab 07/03/16 1535  WBC 5.1  NEUTROABS 3.5  HGB 11.6*  HCT 35.4*  MCV 90.8  PLT 183   Cardiac Enzymes:  Recent Labs Lab 07/03/16 2027 07/04/16 0143 07/04/16 0745  TROPONINI <0.03 <0.03 <0.03   BNP: Invalid input(s): POCBNP CBG: No results for input(s): GLUCAP in the last 168 hours. D-Dimer No results for input(s): DDIMER in the last 72 hours. Hgb A1c No results for input(s): HGBA1C in the last 72 hours. Lipid Profile No results for input(s): CHOL, HDL, LDLCALC, TRIG, CHOLHDL, LDLDIRECT in the last 72 hours. Thyroid function studies No results for input(s): TSH, T4TOTAL, T3FREE, THYROIDAB in the last 72 hours.  Invalid input(s): FREET3 Anemia work up No results for input(s): VITAMINB12, FOLATE, FERRITIN, TIBC, IRON, RETICCTPCT in the last 72 hours. Microbiology No results found for this or any previous visit (from the past 240 hour(s)).   Discharge Instructions:   Discharge Instructions    Diet - low sodium heart healthy    Complete by:  As directed    Increase activity slowly    Complete by:  As directed         Medication List    TAKE these medications   acetaminophen 500 MG tablet Commonly known as:  TYLENOL Take 1,000 mg by mouth every 6 (six) hours as needed for headache.   ALPRAZolam 1 MG tablet Commonly known as:  XANAX Take 1 mg by mouth at bedtime.   aspirin 81 MG EC tablet Take 1 tablet (81 mg total) by mouth daily.   atorvastatin 80 MG tablet Commonly known as:  LIPITOR Take 1 tablet (80 mg total) by mouth daily at 6 PM.   carvedilol 3.125 MG tablet Commonly known as:  COREG Take 3.125 mg by mouth 2 (two) times daily with a meal.   escitalopram 20 MG tablet Commonly known as:  LEXAPRO Take 20 mg by mouth daily.   fluocinonide-emollient 0.05 % cream Commonly known as:  LIDEX-E Apply 1 application topically 2 (two) times daily as needed (psoriasis).   nitroGLYCERIN 0.4 MG SL tablet  Commonly known as:  NITROSTAT Place 1 tablet (0.4 mg total) under the tongue every 5 (five) minutes x 3 doses as needed for chest pain.   ticagrelor 90 MG Tabs tablet Commonly known as:  BRILINTA Take 1 tablet (90 mg total) by mouth 2 (two) times daily.   traMADol 50 MG tablet Commonly known as:  ULTRAM Take 2 tablets (100 mg total) by mouth every 6 (six) hours as needed for moderate pain or severe pain. What changed:  how much to take  when to take this  reasons to take this         Time coordinating discharge: 35 min  Signed:  Ulrick Methot U Tangala Wiegert   [facility] 07/04/2016, 4:46 PM

## 2016-07-04 NOTE — Progress Notes (Signed)
Off work until  07/07/16   Yates Decamp, MD 07/04/2016, 9:34 AM [facility] Pager: [phone] Office: [phone] If no answer: Cell:  [phone]

## 2016-07-04 NOTE — Progress Notes (Signed)
Subjective:  Left shoulder pain persistent and clearly reproducible. No new symptoms or concerns.  Objective:  Vital Signs in the last 24 hours: Temp:  [97.8 F (36.6 C)-98.4 F (36.9 C)] 97.8 F (36.6 C) (12/06 0406) Pulse Rate:  [56-66] 59 (12/06 0406) Resp:  [9-29] 15 (12/05 1930) BP: (88-113)/(53-68) 104/53 (12/06 0406) SpO2:  [96 %-100 %] 99 % (12/06 0406) Weight:  [72.6 kg (160 lb)-75.7 kg (166 lb 12.8 oz)] 75.7 kg (166 lb 12.8 oz) (12/06 0406)  Intake/Output from previous day: 12/05 0701 - 12/06 0700 In: 1342 [P.O.:342; IV Piggyback:1000] Out: 800 [Urine:800]  Physical Exam: General appearance: alert, cooperative, appears stated age and no distress Lungs: clear to auscultation bilaterally Chest wall: left sided chest wall tenderness Heart: regular rate and rhythm, S1, S2 normal, no murmur, click, rub or gallop Abdomen: soft, non-tender; bowel sounds normal; no masses,  no organomegaly Extremities: extremities normal, atraumatic, no cyanosis or edema Pulses: 2+ and symmetric Neurologic: Grossly normal  Lab Results: BMP  Recent Labs  02/20/16 0312 03/20/16 0510 07/03/16 1535  NA 140 135 140  K 4.0 3.7 3.7  CL 114* 104 113*  CO2 23 24 20*  GLUCOSE 114* 104* 101*  BUN 10 14 12   CREATININE 0.82 0.79 0.79  CALCIUM 8.2* 8.9 8.9  GFRNONAA >60 >60 >60  GFRAA >60 >60 >60    CBC  Recent Labs Lab 07/03/16 1535  WBC 5.1  RBC 3.90  HGB 11.6*  HCT 35.4*  PLT 183  MCV 90.8  MCH 29.7  MCHC 32.8  RDW 13.8  LYMPHSABS 1.0  MONOABS 0.3  EOSABS 0.2  BASOSABS 0.0    HEMOGLOBIN A1C No results found for: HGBA1C, MPG  Cardiac Panel (last 3 results)  Recent Labs  02/21/16 1243 07/03/16 2027 07/04/16 0143  TROPONINI 1.95* <0.03 <0.03    BNP (last 3 results) No results for input(s): PROBNP in the last 8760 hours.  TSH  Recent Labs  02/20/16 0312  TSH 0.905    CHOLESTEROL  Recent Labs  02/19/16 2017  CHOL 162    Hepatic Function  Panel  Recent Labs  02/19/16 2017  PROT 6.5  ALBUMIN 3.3*  AST 24  ALT 14  ALKPHOS 69  BILITOT 0.3    Imaging: Dg Chest 2 View  Result Date: 07/03/2016 CLINICAL DATA:  Left-sided chest pain and pain the medial left breast radiating down left arm since last night. Productive cough. EXAM: CHEST  2 VIEW COMPARISON:  03/20/2016 CXR and CT FINDINGS: The heart size and mediastinal contours are within normal limits. Minimal blunting of the left costophrenic angle may represent a trace effusion or pleural thickening. Likely pleural thickening based on prior CT. Both lungs are clear. The visualized skeletal structures are unremarkable. IMPRESSION: No active cardiopulmonary disease. Minimal blunting of the left costophrenic angle may represent a trace effusion or slight pleural thickening, favor slight pleural thickening based on prior CT. Electronically Signed   By: Tollie Eth M.D.   On: 07/03/2016 16:43    Cardiac Studies:  EKG 07/04/2016: normal EKG, normal sinus rhythm, unchanged from previous tracings.  Assessment/Plan:  1. Chest pain is at most atypical and easily reproducible, in spite of chest pain for almost 24 hours, no EKG abnormalities, cardiac markers are negative myocardial injury. 2. Hyperlipidemia, controlled 3. Coronary artery disease of native vessels, Coronary Angiogram 02/19/2016 Mid RCA 99% stenosed S/P 3.0 x 30 mm resolute DES reduced to 0%, TIMI-3 to TIMI-3 flow. Left main ostial 30-40%, mid LAD  diffuse long segment disease of 20-30% followed by a focal 60-70% stenosis. Small ramus intermediate and circumflex without significant disease. 4. Chronic chest pain syndrome 5. Anxiety  Recommendation: Serial troponin remains negative. No EKG changes. Doubt cardiac etiology. Recommend continued treatment of musculoskeletal pain. We'll see outpatient as scheduled.   Erling Contellison,Keeton Kassebaum Nicole, NP-C 07/04/2016, 8:00 AM [facility] Pager: [phone] Office:  [phone] If no answer: [phone]

## 2016-07-17 ENCOUNTER — Emergency Department (HOSPITAL_COMMUNITY): Payer: BLUE CROSS/BLUE SHIELD

## 2016-07-17 ENCOUNTER — Encounter (HOSPITAL_COMMUNITY): Payer: Self-pay | Admitting: Emergency Medicine

## 2016-07-17 ENCOUNTER — Observation Stay (HOSPITAL_COMMUNITY)
Admission: EM | Admit: 2016-07-17 | Discharge: 2016-07-19 | Disposition: A | Discharge status: Home / Self Care | Payer: BLUE CROSS/BLUE SHIELD | Attending: Internal Medicine | Admitting: Internal Medicine

## 2016-07-17 DIAGNOSIS — G894 Chronic pain syndrome: Secondary | ICD-10-CM | POA: Insufficient documentation

## 2016-07-17 DIAGNOSIS — Z955 Presence of coronary angioplasty implant and graft: Secondary | ICD-10-CM | POA: Diagnosis not present

## 2016-07-17 DIAGNOSIS — Z7982 Long term (current) use of aspirin: Secondary | ICD-10-CM | POA: Diagnosis not present

## 2016-07-17 DIAGNOSIS — Z87891 Personal history of nicotine dependence: Secondary | ICD-10-CM | POA: Diagnosis not present

## 2016-07-17 DIAGNOSIS — R0602 Shortness of breath: Secondary | ICD-10-CM | POA: Diagnosis not present

## 2016-07-17 DIAGNOSIS — I9589 Other hypotension: Secondary | ICD-10-CM | POA: Diagnosis not present

## 2016-07-17 DIAGNOSIS — I252 Old myocardial infarction: Secondary | ICD-10-CM | POA: Insufficient documentation

## 2016-07-17 DIAGNOSIS — R079 Chest pain, unspecified: Principal | ICD-10-CM | POA: Insufficient documentation

## 2016-07-17 DIAGNOSIS — F418 Other specified anxiety disorders: Secondary | ICD-10-CM | POA: Diagnosis present

## 2016-07-17 DIAGNOSIS — Z79899 Other long term (current) drug therapy: Secondary | ICD-10-CM | POA: Insufficient documentation

## 2016-07-17 DIAGNOSIS — I10 Essential (primary) hypertension: Secondary | ICD-10-CM | POA: Diagnosis not present

## 2016-07-17 DIAGNOSIS — L409 Psoriasis, unspecified: Secondary | ICD-10-CM | POA: Diagnosis not present

## 2016-07-17 DIAGNOSIS — R0789 Other chest pain: Secondary | ICD-10-CM | POA: Diagnosis not present

## 2016-07-17 DIAGNOSIS — E785 Hyperlipidemia, unspecified: Secondary | ICD-10-CM | POA: Insufficient documentation

## 2016-07-17 DIAGNOSIS — E860 Dehydration: Secondary | ICD-10-CM | POA: Diagnosis not present

## 2016-07-17 DIAGNOSIS — D649 Anemia, unspecified: Secondary | ICD-10-CM | POA: Diagnosis not present

## 2016-07-17 DIAGNOSIS — I2510 Atherosclerotic heart disease of native coronary artery without angina pectoris: Secondary | ICD-10-CM | POA: Insufficient documentation

## 2016-07-17 DIAGNOSIS — M797 Fibromyalgia: Secondary | ICD-10-CM | POA: Diagnosis not present

## 2016-07-17 DIAGNOSIS — R072 Precordial pain: Secondary | ICD-10-CM | POA: Diagnosis present

## 2016-07-17 LAB — BASIC METABOLIC PANEL
ANION GAP: 6 (ref 5–15)
BUN: 11 mg/dL (ref 6–20)
CALCIUM: 8.3 mg/dL — AB (ref 8.9–10.3)
CO2: 20 mmol/L — ABNORMAL LOW (ref 22–32)
CREATININE: 0.87 mg/dL (ref 0.44–1.00)
Chloride: 115 mmol/L — ABNORMAL HIGH (ref 101–111)
GFR calc Af Amer: >60 mL/min (ref >60)
GFR calc non Af Amer: >60 mL/min (ref >60)
GLUCOSE: 100 mg/dL — AB (ref 65–99)
Potassium: 4.2 mmol/L (ref 3.5–5.1)
Sodium: 141 mmol/L (ref 135–145)

## 2016-07-17 LAB — CBC
HCT: 33.7 % — ABNORMAL LOW (ref 36.0–46.0)
HEMOGLOBIN: 11.2 g/dL — AB (ref 12.0–15.0)
MCH: 29.6 pg (ref 26.0–34.0)
MCHC: 33.2 g/dL (ref 30.0–36.0)
MCV: 89.2 fL (ref 78.0–100.0)
PLATELETS: 191 10*3/uL (ref 150–400)
RBC: 3.78 MIL/uL — ABNORMAL LOW (ref 3.87–5.11)
RDW: 13.4 % (ref 11.5–15.5)
WBC: 4.1 10*3/uL (ref 4.0–10.5)

## 2016-07-17 LAB — I-STAT TROPONIN, ED: TROPONIN I, POC: 0 ng/mL (ref 0.00–0.08)

## 2016-07-17 NOTE — ED Provider Notes (Signed)
[facility]-EMERGENCY DEPT Provider Note   CSN: [id number] Arrival date & time: 07/17/16  2118     History   Chief Complaint Chief Complaint  Patient presents with  . Chest Pain    HPI OLINE BELK is a 50 y.o. female.  JOSELYNN AMOROSO is a 50 y.o. Female with a history of coronary artery disease with recent PCI earlier this year, depression, anxiety and fibromyalgia who presents to the emergency department complaining of chest pain beginning around 7:40 PM tonight. Patient reports she was at work. She works as an International aid/development worker at [employer]. She reports all walking around she began having some left-sided chest pain that she described as sharp and burning. She took a nitroglycerin without relief. She reports currently her sharp chest pain is resolved but she still complains of some burning. She reports that nitroglycerin did not relieve this chest pain. She reports some slight shortness of breath earlier but none currently. She also took 324 mg of aspirin. Her cardiologist is Dr. Jacinto Halim.  She reports feeling fatigued recently and had has been almost falling asleep at the wheel over the past 3 days. Patient denies fevers, coughing, palpitations, lightheadedness, dizziness, syncope, leg pain, leg swelling, nausea, vomiting, diarrhea or rashes.   The history is provided by the patient. No language interpreter was used.  Chest Pain   Pertinent negatives include no abdominal pain, no back pain, no cough, no fever, no headaches, no nausea, no palpitations, no shortness of breath, no vomiting and no weakness.    Past Medical History:  Diagnosis Date  . Anxiety   . Arthritis   . Depression   . Fibromyalgia   . MI (myocardial infarction) 01/2016    Patient Active Problem List   Diagnosis Date Noted  . Chest pain 07/03/2016  . Depression with anxiety 07/03/2016  . Normocytic anemia 07/03/2016  . Acute MI, inferior wall, initial episode of care (HCC) 02/19/2016  . CAD (coronary artery  disease), native coronary artery 02/19/2016    Past Surgical History:  Procedure Laterality Date  . ABDOMINAL HYSTERECTOMY    . CARDIAC CATHETERIZATION N/A 02/19/2016   Procedure: Left Heart Cath and Coronary Angiography;  Surgeon: Yates Decamp, MD;  Location: [facility] INVASIVE CV LAB;  Service: Cardiovascular;  Laterality: N/A;  . CARDIAC CATHETERIZATION N/A 02/19/2016   Procedure: Coronary Stent Intervention;  Surgeon: Yates Decamp, MD;  Location: [facility] INVASIVE CV LAB;  Service: Cardiovascular;  Laterality: N/A;  . cesarian      OB History    No data available       Home Medications    Prior to Admission medications   Medication Sig Start Date End Date Taking? Authorizing Provider  acetaminophen (TYLENOL) 500 MG tablet Take 1,000 mg by mouth every 6 (six) hours as needed for headache.   Yes Historical Provider, MD  ALPRAZolam Prudy Feeler) 1 MG tablet Take 1 mg by mouth at bedtime. 06/26/16  Yes Historical Provider, MD  aspirin EC 81 MG EC tablet Take 1 tablet (81 mg total) by mouth daily. 02/20/16  Yes Marcy Salvo, NP  atorvastatin (LIPITOR) 80 MG tablet Take 1 tablet (80 mg total) by mouth daily at 6 PM. 02/20/16  Yes Marcy Salvo, NP  carvedilol (COREG) 3.125 MG tablet Take 3.125 mg by mouth 2 (two) times daily with a meal.   Yes Historical Provider, MD  escitalopram (LEXAPRO) 20 MG tablet Take 20 mg by mouth daily. 03/31/16  Yes Historical Provider, MD  fluocinonide-emollient (LIDEX-E) 0.05 % cream Apply  1 application topically 2 (two) times daily as needed (psoriasis).   Yes Historical Provider, MD  nitroGLYCERIN (NITROSTAT) 0.4 MG SL tablet Place 1 tablet (0.4 mg total) under the tongue every 5 (five) minutes x 3 doses as needed for chest pain. 02/20/16  Yes Marcy Salvo, NP  ticagrelor (BRILINTA) 90 MG TABS tablet Take 1 tablet (90 mg total) by mouth 2 (two) times daily. 02/20/16  Yes Marcy Salvo, NP  traMADol (ULTRAM) 50 MG tablet Take 2 tablets (100 mg total) by mouth every 6  (six) hours as needed for moderate pain or severe pain. 07/04/16  Yes Joseph Art, DO    Family History No family history on file.  Social History Social History  Substance Use Topics  . Smoking status: Former Smoker    Quit date: 03/21/2011  . Smokeless tobacco: Never Used  . Alcohol use No     Allergies   Bee venom; Adhesive [tape]; and Triple antibiotic [bacitracin-neomycin-polymyxin]   Review of Systems Review of Systems  Constitutional: Positive for fatigue. Negative for chills and fever.  HENT: Negative for congestion and sore throat.   Eyes: Negative for visual disturbance.  Respiratory: Negative for cough and shortness of breath.   Cardiovascular: Positive for chest pain. Negative for palpitations and leg swelling.  Gastrointestinal: Negative for abdominal pain, diarrhea, nausea and vomiting.  Genitourinary: Negative for dysuria.  Musculoskeletal: Negative for back pain and neck pain.  Skin: Negative for rash.  Neurological: Negative for syncope, weakness, light-headedness and headaches.     Physical Exam Updated Vital Signs BP 100/63   Pulse (!) 56   Temp 98.7 F (37.1 C) (Oral)   Resp 13   SpO2 97%   Physical Exam  Constitutional: She is oriented to person, place, and time. She appears well-developed and well-nourished. No distress.  Nontoxic appearing.  HENT:  Head: Normocephalic and atraumatic.  Mouth/Throat: Oropharynx is clear and moist.  Eyes: Conjunctivae are normal. Pupils are equal, round, and reactive to light. Right eye exhibits no discharge. Left eye exhibits no discharge.  Neck: Neck supple. No JVD present.  Cardiovascular: Normal rate, regular rhythm, normal heart sounds and intact distal pulses.  Exam reveals no gallop and no friction rub.   No murmur heard. Bilateral radial, posterior tibialis and dorsalis pedis pulses are intact.    Pulmonary/Chest: Effort normal and breath sounds normal. No stridor. No respiratory distress. She has no  wheezes. She has no rales. She exhibits tenderness.  Lungs clear to auscultation bilaterally. Left lower chest wall beneath her breasts is tender to palpation and reproduces her pain.  Abdominal: Soft. There is no tenderness. There is no guarding.  Musculoskeletal: She exhibits no edema or tenderness.  No lower extremity edema or tenderness.  Lymphadenopathy:    She has no cervical adenopathy.  Neurological: She is alert and oriented to person, place, and time. No cranial nerve deficit or sensory deficit. She exhibits normal muscle tone. Coordination normal.  Skin: Skin is warm and dry. Capillary refill takes less than 2 seconds. No rash noted. She is not diaphoretic. No erythema. No pallor.  Psychiatric: She has a normal mood and affect. Her behavior is normal.  Nursing note and vitals reviewed.    ED Treatments / Results  Labs (all labs ordered are listed, but only abnormal results are displayed) Labs Reviewed  BASIC METABOLIC PANEL - Abnormal; Notable for the following:       Result Value   Chloride 115 (*)    CO2 20 (*)  Glucose, Bld 100 (*)    Calcium 8.3 (*)    All other components within normal limits  CBC - Abnormal; Notable for the following:    RBC 3.78 (*)    Hemoglobin 11.2 (*)    HCT 33.7 (*)    All other components within normal limits  I-STAT TROPOININ, ED    EKG  EKG Interpretation  Date/Time:  Tuesday July 17 2016 21:19:41 EST Ventricular Rate:  57 PR Interval:  166 QRS Duration: 66 QT Interval:  412 QTC Calculation: 401 R Axis:   82 Text Interpretation:  Sinus bradycardia Otherwise normal ECG No significant change since last tracing Confirmed by [facility] Bay Medical CenterCHLOSSMAN MD, ERIN ([id number]) on 07/17/2016 11:40:16 PM       Radiology Dg Chest 2 View  Result Date: 07/17/2016 CLINICAL DATA:  50 year old female with acute left chest pain and shortness of breath. Initial encounter. Former smoker. EXAM: CHEST  2 VIEW COMPARISON:  07/03/2016 and earlier. FINDINGS:  Normal cardiac size and mediastinal contours. Stable lung volumes. Mild diffuse increased pulmonary interstitial markings are stable and might reflect prior smoking. No pneumothorax, pulmonary edema, pleural effusion or confluent pulmonary opacity. Visualized tracheal air column is within normal limits. No acute osseous abnormality identified. Negative visible bowel gas pattern. IMPRESSION: No acute cardiopulmonary abnormality. Electronically Signed   By: Odessa FlemingH  Hall M.D.   On: 07/17/2016 21:58    Procedures Procedures (including critical care time)  Medications Ordered in ED Medications - No data to display   Initial Impression / Assessment and Plan / ED Course  I have reviewed the triage vital signs and the nursing notes.  Pertinent labs & imaging results that were available during my care of the patient were reviewed by me and considered in my medical decision making (see chart for details).  Clinical Course    This is a 50 y.o. Female with a history of coronary artery disease with recent PCI earlier this year, depression, anxiety and fibromyalgia who presents to the emergency department complaining of chest pain beginning around 7:40 PM tonight. Patient reports she was at work. 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 She reports all walking around she began having some left-sided chest pain that she described as sharp and burning. She took a nitroglycerin without relief. She reports currently her sharp chest pain is resolved but she still complains of some burning. She reports that nitroglycerin did not relieve this chest pain. She reports some slight shortness of breath earlier but none currently. She also took 324 mg of aspirin. Her cardiologist is Dr. Jacinto HalimGanji.  She reports feeling fatigued recently and had has been almost falling asleep at the wheel over the past 3 days. On exam the patient is afebrile nontoxic appearing. Lungs clear to auscultation bilaterally. EKG shows sinus bradycardia  with no significant change from her last tracing. Troponin is not elevated. BMP is unremarkable. CBC is unremarkable. Involvement is stable. Chest x-ray without acute findings. Patient with HEART score of 5. She was recently admitted for similar chest pain and had delta trop. Plan was for outpatient stress test. She has not had a stress test. I think she needs admission for cardiac stress test with this chest pain again. Patient agrees with plan.   I consulted with Dr. Katrinka BlazingSmith who accepted the patient for admission and requested temp orders for tele bed.   This patient was discussed with Dr. Dalene SeltzerSchlossman who agrees with assessment and plan.     Final Clinical Impressions(s) / ED Diagnoses  Final diagnoses:  Nonspecific chest pain    New Prescriptions New Prescriptions   No medications on file     Everlene Farrier, PA-C 07/18/16 5621    Alvira Monday, MD 07/19/16 1623

## 2016-07-17 NOTE — ED Triage Notes (Signed)
Per EMS, pt c/o sharp chest pain under the left breast beginning at 1940 this evening. Pt took 324 aspirin and 1 NTG prior to EMS arrival. Initial BP-100/70, after 2nd dose of NTG, Bp-98/40, given NS. Pt endorses increased stress. Cardiac history.

## 2016-07-17 NOTE — ED Notes (Signed)
Pt tearful in the treatment room. States she has multiple stressors at home, is unable to sleep at night and also states that she "falls asleep while driving to work in the am".

## 2016-07-18 ENCOUNTER — Encounter (HOSPITAL_COMMUNITY): Payer: Self-pay

## 2016-07-18 DIAGNOSIS — I10 Essential (primary) hypertension: Secondary | ICD-10-CM | POA: Diagnosis not present

## 2016-07-18 DIAGNOSIS — L409 Psoriasis, unspecified: Secondary | ICD-10-CM | POA: Diagnosis present

## 2016-07-18 DIAGNOSIS — D649 Anemia, unspecified: Secondary | ICD-10-CM

## 2016-07-18 DIAGNOSIS — M797 Fibromyalgia: Secondary | ICD-10-CM | POA: Diagnosis not present

## 2016-07-18 DIAGNOSIS — F418 Other specified anxiety disorders: Secondary | ICD-10-CM | POA: Diagnosis not present

## 2016-07-18 DIAGNOSIS — I2510 Atherosclerotic heart disease of native coronary artery without angina pectoris: Secondary | ICD-10-CM | POA: Diagnosis not present

## 2016-07-18 DIAGNOSIS — I252 Old myocardial infarction: Secondary | ICD-10-CM | POA: Diagnosis not present

## 2016-07-18 DIAGNOSIS — G894 Chronic pain syndrome: Secondary | ICD-10-CM | POA: Diagnosis not present

## 2016-07-18 DIAGNOSIS — R079 Chest pain, unspecified: Secondary | ICD-10-CM | POA: Diagnosis not present

## 2016-07-18 LAB — MRSA PCR SCREENING: MRSA BY PCR: NEGATIVE

## 2016-07-18 LAB — BASIC METABOLIC PANEL
ANION GAP: 3 — AB (ref 5–15)
BUN: 12 mg/dL (ref 6–20)
CHLORIDE: 115 mmol/L — AB (ref 101–111)
CO2: 22 mmol/L (ref 22–32)
Calcium: 8.3 mg/dL — ABNORMAL LOW (ref 8.9–10.3)
Creatinine, Ser: 0.86 mg/dL (ref 0.44–1.00)
Glucose, Bld: 91 mg/dL (ref 65–99)
POTASSIUM: 3.7 mmol/L (ref 3.5–5.1)
SODIUM: 140 mmol/L (ref 135–145)

## 2016-07-18 LAB — CBC
HCT: 34.2 % — ABNORMAL LOW (ref 36.0–46.0)
HEMOGLOBIN: 10.7 g/dL — AB (ref 12.0–15.0)
MCH: 28.9 pg (ref 26.0–34.0)
MCHC: 31.3 g/dL (ref 30.0–36.0)
MCV: 92.4 fL (ref 78.0–100.0)
PLATELETS: 176 10*3/uL (ref 150–400)
RBC: 3.7 MIL/uL — AB (ref 3.87–5.11)
RDW: 14 % (ref 11.5–15.5)
WBC: 3.8 10*3/uL — AB (ref 4.0–10.5)

## 2016-07-18 LAB — TROPONIN I
Troponin I: <0.03 ng/mL (ref <0.03)
Troponin I: <0.03 ng/mL (ref <0.03)

## 2016-07-18 MED ORDER — TICAGRELOR 90 MG PO TABS
90.0000 mg | ORAL_TABLET | Freq: Two times a day (BID) | ORAL | Status: DC
  Administered 2016-07-18 – 2016-07-19 (×3): 90 mg via ORAL
  Filled 2016-07-18 (×4): qty 1

## 2016-07-18 MED ORDER — KETOROLAC TROMETHAMINE 15 MG/ML IJ SOLN
15.0000 mg | Freq: Four times a day (QID) | INTRAMUSCULAR | Status: DC | PRN
  Administered 2016-07-18 – 2016-07-19 (×2): 15 mg via INTRAVENOUS
  Filled 2016-07-18 (×4): qty 1

## 2016-07-18 MED ORDER — SODIUM CHLORIDE 0.9 % IV SOLN
INTRAVENOUS | Status: DC
  Administered 2016-07-18 (×2): via INTRAVENOUS

## 2016-07-18 MED ORDER — SODIUM CHLORIDE 0.9 % IV BOLUS (SEPSIS)
1000.0000 mL | Freq: Once | INTRAVENOUS | Status: AC
  Administered 2016-07-18: 1000 mL via INTRAVENOUS

## 2016-07-18 MED ORDER — ALPRAZOLAM 0.5 MG PO TABS
1.0000 mg | ORAL_TABLET | Freq: Every day | ORAL | Status: DC
  Administered 2016-07-18 (×2): 1 mg via ORAL
  Filled 2016-07-18 (×2): qty 2

## 2016-07-18 MED ORDER — FLUOCINONIDE-E 0.05 % EX CREA: 1.0000 "application " | TOPICAL_CREAM | Freq: Two times a day (BID) | CUTANEOUS | Status: DC | PRN

## 2016-07-18 MED ORDER — ALPRAZOLAM 0.5 MG PO TABS: 1.0000 mg | ORAL_TABLET | Freq: Two times a day (BID) | ORAL | Status: DC | PRN

## 2016-07-18 MED ORDER — ESCITALOPRAM OXALATE 10 MG PO TABS
20.0000 mg | ORAL_TABLET | Freq: Every day | ORAL | Status: DC
  Administered 2016-07-18 – 2016-07-19 (×2): 20 mg via ORAL
  Filled 2016-07-18 (×2): qty 2

## 2016-07-18 MED ORDER — METHOCARBAMOL 1000 MG/10ML IJ SOLN
500.0000 mg | Freq: Three times a day (TID) | INTRAMUSCULAR | Status: DC | PRN
  Filled 2016-07-18: qty 5

## 2016-07-18 MED ORDER — NITROGLYCERIN 0.4 MG SL SUBL
0.4000 mg | SUBLINGUAL_TABLET | SUBLINGUAL | Status: DC | PRN
  Administered 2016-07-18 (×2): 0.4 mg via SUBLINGUAL
  Filled 2016-07-18: qty 1

## 2016-07-18 MED ORDER — ACETAMINOPHEN 325 MG PO TABS: 650.0000 mg | ORAL_TABLET | ORAL | Status: DC | PRN

## 2016-07-18 MED ORDER — ENOXAPARIN SODIUM 40 MG/0.4ML ~~LOC~~ SOLN
40.0000 mg | SUBCUTANEOUS | Status: DC
  Administered 2016-07-18 – 2016-07-19 (×2): 40 mg via SUBCUTANEOUS
  Filled 2016-07-18 (×2): qty 0.4

## 2016-07-18 MED ORDER — MORPHINE SULFATE (PF) 4 MG/ML IV SOLN
2.0000 mg | INTRAVENOUS | Status: DC | PRN
  Administered 2016-07-18 – 2016-07-19 (×3): 2 mg via INTRAVENOUS
  Filled 2016-07-18 (×3): qty 1

## 2016-07-18 MED ORDER — GI COCKTAIL ~~LOC~~
30.0000 mL | Freq: Four times a day (QID) | ORAL | Status: DC | PRN
  Administered 2016-07-18: 30 mL via ORAL
  Filled 2016-07-18: qty 30

## 2016-07-18 MED ORDER — ONDANSETRON HCL 4 MG/2ML IJ SOLN: 4.0000 mg | Freq: Four times a day (QID) | INTRAMUSCULAR | Status: DC | PRN

## 2016-07-18 MED ORDER — ACETAMINOPHEN 500 MG PO TABS: 1000.0000 mg | ORAL_TABLET | Freq: Four times a day (QID) | ORAL | Status: DC | PRN

## 2016-07-18 MED ORDER — ATORVASTATIN CALCIUM 80 MG PO TABS
80.0000 mg | ORAL_TABLET | Freq: Every day | ORAL | Status: DC
  Administered 2016-07-18: 80 mg via ORAL
  Filled 2016-07-18: qty 1

## 2016-07-18 MED ORDER — TRAMADOL HCL 50 MG PO TABS
100.0000 mg | ORAL_TABLET | Freq: Four times a day (QID) | ORAL | Status: DC | PRN
  Administered 2016-07-19: 100 mg via ORAL
  Filled 2016-07-18: qty 2

## 2016-07-18 MED ORDER — ASPIRIN EC 81 MG PO TBEC
81.0000 mg | DELAYED_RELEASE_TABLET | Freq: Every day | ORAL | Status: DC
  Administered 2016-07-18 – 2016-07-19 (×2): 81 mg via ORAL
  Filled 2016-07-18 (×2): qty 1

## 2016-07-18 MED ORDER — CARVEDILOL 3.125 MG PO TABS
3.1250 mg | ORAL_TABLET | Freq: Two times a day (BID) | ORAL | Status: DC
  Administered 2016-07-19: 3.125 mg via ORAL
  Filled 2016-07-18 (×2): qty 1

## 2016-07-18 NOTE — Progress Notes (Signed)
Patient awoke from nap complaining of 6/10 left lower chest pain.  Describes the pain as a burning sensation.  RN gave PRN morphine, 1 SL nitro and Gi cocktail.  VSS and Dr. Blake Divine notified. [place], Mitzi Hansen

## 2016-07-18 NOTE — H&P (Signed)
History and Physical    Carolyn Gaines:[id number] DOB: [birth year] DOA: 07/17/2016  Referring MD/NP/PA: Everlene Farrier, PA-C PCP: Pcp Not In System  Patient coming from: [facility] via EMS  Chief Complaint: Chest pain  HPI: Carolyn Gaines is a 50 y.o. female with medical history significant of CAD s/p PCI in July 2017, depressions/anxiety, psoriasis, and fibromyalgia; who presents with complaints of chest pain. Symptoms started acutely last night around 7:40 PM. 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 Patient reports having sharp left-sided chest pain underneath her breast that was shooting in nature. Patient tried sitting down and taken and nitroglycerin without relief of symptoms. Next she took and she had 4 baby aspirins. Coworkers checked her blood pressure was noted to be 89/63. At some point during this time her manager was noted to be fussing at her ears dating that this could not continue going on and that she is causing this upon herself prior to calling EMS. Associated symptoms include patient getting pale, diaphoretic, and decreased overall oral intake. Denies any nausea, vomiting, abdominal pain, loss consciousness, or dysuria. Patient with just last admitted to the hospital earlier this month from 12/5 -12/6 for similar symptoms and was discharged with recommendation of needing an outpatient stress test.  ED Course: Upon admission patient noted to be afebrile with blood pressure as well as 95/52, and all other vital signs relatively within normal limits. Initial troponin 0.0. EKG showing sinus bradycardia, but no other significant ischemic changes. Chest x-ray negative for any acute cardiopulmonary abnormality. [facility] called to admit for chest pain rule out possible need of stress test.  Review of Systems: As per HPI otherwise 10 point review of systems negative.   Past Medical History:  Diagnosis Date  . Anxiety   . Arthritis   . Depression   . Fibromyalgia    . MI (myocardial infarction) 01/2016    Past Surgical History:  Procedure Laterality Date  . ABDOMINAL HYSTERECTOMY    . CARDIAC CATHETERIZATION N/A 02/19/2016   Procedure: Left Heart Cath and Coronary Angiography;  Surgeon: Yates Decamp, MD;  Location: [facility] INVASIVE CV LAB;  Service: Cardiovascular;  Laterality: N/A;  . CARDIAC CATHETERIZATION N/A 02/19/2016   Procedure: Coronary Stent Intervention;  Surgeon: Yates Decamp, MD;  Location: [facility] INVASIVE CV LAB;  Service: Cardiovascular;  Laterality: N/A;  . cesarian       reports that she quit smoking about 5 years ago. She has never used smokeless tobacco. She reports that she does not drink alcohol or use drugs.  Allergies  Allergen Reactions  . Bee Venom Other (See Comments)    Pus sites at injection  . Adhesive [Tape] Itching and Swelling    Please use paper tape  . Triple Antibiotic [Bacitracin-Neomycin-Polymyxin] Rash    No family history on file.  Prior to Admission medications   Medication Sig Start Date End Date Taking? Authorizing Provider  acetaminophen (TYLENOL) 500 MG tablet Take 1,000 mg by mouth every 6 (six) hours as needed for headache.   Yes Historical Provider, MD  ALPRAZolam Prudy Feeler) 1 MG tablet Take 1 mg by mouth at bedtime. 06/26/16  Yes Historical Provider, MD  aspirin EC 81 MG EC tablet Take 1 tablet (81 mg total) by mouth daily. 02/20/16  Yes Marcy Salvo, NP  atorvastatin (LIPITOR) 80 MG tablet Take 1 tablet (80 mg total) by mouth daily at 6 PM. 02/20/16  Yes Marcy Salvo, NP  carvedilol (COREG) 3.125 MG tablet  Take 3.125 mg by mouth 2 (two) times daily with a meal.   Yes Historical Provider, MD  escitalopram (LEXAPRO) 20 MG tablet Take 20 mg by mouth daily. 03/31/16  Yes Historical Provider, MD  fluocinonide-emollient (LIDEX-E) 0.05 % cream Apply 1 application topically 2 (two) times daily as needed (psoriasis).   Yes Historical Provider, MD  nitroGLYCERIN (NITROSTAT) 0.4 MG SL tablet Place 1 tablet (0.4 mg  total) under the tongue every 5 (five) minutes x 3 doses as needed for chest pain. 02/20/16  Yes Marcy Salvo, NP  ticagrelor (BRILINTA) 90 MG TABS tablet Take 1 tablet (90 mg total) by mouth 2 (two) times daily. 02/20/16  Yes Marcy Salvo, NP  traMADol (ULTRAM) 50 MG tablet Take 2 tablets (100 mg total) by mouth every 6 (six) hours as needed for moderate pain or severe pain. 07/04/16  Yes Joseph Art, DO    Physical Exam:    Constitutional: Older female who appears in some distress intermittently tearful throughout physical exam. Vitals:   07/17/16 2123 07/17/16 2206 07/17/16 2207  BP: (!) 95/52 105/70   Pulse: 60 62   Resp: 18  21  Temp: 98.7 F (37.1 C)    TempSrc: Oral    SpO2: 100% 99%    Eyes: PERRL, lids and conjunctivae normal ENMT: Mucous membranes are dry. Posterior pharynx clear of any exudate or lesions. Normal dentition.  Neck: normal, supple, no masses, no thyromegaly Respiratory: clear to auscultation bilaterally, no wheezing, no crackles. Normal respiratory effort. No accessory muscle use.  Cardiovascular: Regular rate and rhythm, no murmurs / rubs / gallops. No extremity edema. 2+ pedal pulses. No carotid bruits. Appears some aspects of pain can be worsened with movement and reproducible with palpation. Abdomen: no tenderness, no masses palpated. No hepatosplenomegaly. Bowel sounds positive.  Musculoskeletal: no clubbing / cyanosis. No joint deformity upper and lower extremities. Good ROM, no contractures. Normal muscle tone.  Skin: no rashes, lesions, ulcers. No induration Neurologic: CN 2-12 grossly intact. Sensation intact, DTR normal. Strength 5/5 in all 4.  Psychiatric: Normal judgment and insight. Alert and oriented x 3. Depressed mood.     Labs on Admission: I have personally reviewed following labs and imaging studies  CBC:  Recent Labs Lab 07/17/16 2207  WBC 4.1  HGB 11.2*  HCT 33.7*  MCV 89.2  PLT 191   Basic Metabolic  Panel:  Recent Labs Lab 07/17/16 2207  NA 141  K 4.2  CL 115*  CO2 20*  GLUCOSE 100*  BUN 11  CREATININE 0.87  CALCIUM 8.3*   GFR: Estimated Creatinine Clearance: 76.2 mL/min (by C-G formula based on SCr of 0.87 mg/dL). Liver Function Tests: No results for input(s): AST, ALT, ALKPHOS, BILITOT, PROT, ALBUMIN in the last 168 hours. No results for input(s): LIPASE, AMYLASE in the last 168 hours. No results for input(s): AMMONIA in the last 168 hours. Coagulation Profile: No results for input(s): INR, PROTIME in the last 168 hours. Cardiac Enzymes: No results for input(s): CKTOTAL, CKMB, CKMBINDEX, TROPONINI in the last 168 hours. BNP (last 3 results) No results for input(s): PROBNP in the last 8760 hours. HbA1C: No results for input(s): HGBA1C in the last 72 hours. CBG: No results for input(s): GLUCAP in the last 168 hours. Lipid Profile: No results for input(s): CHOL, HDL, LDLCALC, TRIG, CHOLHDL, LDLDIRECT in the last 72 hours. Thyroid Function Tests: No results for input(s): TSH, T4TOTAL, FREET4, T3FREE, THYROIDAB in the last 72 hours. Anemia Panel: No results for input(s): VITAMINB12,  FOLATE, FERRITIN, TIBC, IRON, RETICCTPCT in the last 72 hours. Urine analysis: No results found for: COLORURINE, APPEARANCEUR, LABSPEC, PHURINE, GLUCOSEU, HGBUR, BILIRUBINUR, KETONESUR, PROTEINUR, UROBILINOGEN, NITRITE, LEUKOCYTESUR Sepsis Labs: No results found for this or any previous visit (from the past 240 hour(s)).   Radiological Exams on Admission: Dg Chest 2 View  Result Date: 07/17/2016 CLINICAL DATA:  50 year old female with acute left chest pain and shortness of breath. Initial encounter. Former smoker. EXAM: CHEST  2 VIEW COMPARISON:  07/03/2016 and earlier. FINDINGS: Normal cardiac size and mediastinal contours. Stable lung volumes. Mild diffuse increased pulmonary interstitial markings are stable and might reflect prior smoking. No pneumothorax, pulmonary edema, pleural  effusion or confluent pulmonary opacity. Visualized tracheal air column is within normal limits. No acute osseous abnormality identified. Negative visible bowel gas pattern. IMPRESSION: No acute cardiopulmonary abnormality. Electronically Signed   By: Odessa FlemingH  Hall M.D.   On: 07/17/2016 21:58    EKG: Independently reviewed. Sinus bradycardia at 57 bpm  Assessment/Plan Chest pain: Recurrent. Patient presents with complaints of chest pain. Previously just hospitalized 12/5 -12/6 with discharge recommendation of need of outpatient stress test. Initial troponin and EKG negative for any acute ischemic changes. Heart score =  5. - Admit to stepdown for continued chest pain - Chest pain or sudden initiated. - Trend cardiac enzymes - Repeat EKG at 6 AM - Consult cardiology in a.m. for need of stress testing  Transient hypotension/Essential hypertension: Acute. Following administration of nitroglycerin patient's blood pressures were noted to be as low as 89/63 prior to arrival. Question possibility of some aspect of dehydration as patient admits to decreased overall oral intake.  - Continue Coreg - Gentle IV fluids  CAD s/p PCI in July 2017 - Continue Brilinta and ASA  Anxiety/depression: Patient presents with a lot of social stressors and is intermittently tearful. - Continue Lexapro - Psychiatry consult may be warranted  Psoriasis - Continue Lidex-E prn Psoriasis outbreaks  Anemia: Initial hemoglobin 11.2 - Continue to monitor  Hyperlipidemia - Continue atorvastatin    DVT prophylaxis:  lovenox   Code Status: Full Family Communication: Discussed overall plan with the patient present at bedside Disposition Plan: Likely discharge home if medically stable  Consults called: none  Admission status: Observation  Clydie Braunondell A Wellen MD [facility] Pager [phone]  If 7PM-7AM, please contact night-coverage [email or web address] Password [email or web address]  07/18/2016, 1:07 AM

## 2016-07-18 NOTE — Progress Notes (Signed)
Carolyn Gaines is a 50 y.o. female with medical history significant of CAD s/p PCI in July 2017, depressions/anxiety, psoriasis, and fibromyalgia; who presents with complaints of chest pain. Left sided under the left breast, worsening on palpation and deep breathing, sharp, not associated with sob or nausea or palpitations. troponis are negative. EKG does not show any ischemic changes. Last echo does not show any abnormality. She was ruled out for ACS. Discussed with cardiology Dr Jacinto Halim, pt had recent cardiac cath in 01/2016 and stress test in 03/2016. We will treat her musculoskeletal pain with pain meds and  Will continue to monitor.   Kathlen Mody, MD [phone]

## 2016-07-18 NOTE — ED Notes (Signed)
Admitting MD aware pt continues to c/o 7/10 burning chest pain.

## 2016-07-19 DIAGNOSIS — I2510 Atherosclerotic heart disease of native coronary artery without angina pectoris: Secondary | ICD-10-CM | POA: Diagnosis not present

## 2016-07-19 DIAGNOSIS — F418 Other specified anxiety disorders: Secondary | ICD-10-CM | POA: Diagnosis not present

## 2016-07-19 DIAGNOSIS — R0789 Other chest pain: Secondary | ICD-10-CM | POA: Diagnosis not present

## 2016-07-19 DIAGNOSIS — I10 Essential (primary) hypertension: Secondary | ICD-10-CM | POA: Diagnosis not present

## 2016-07-19 DIAGNOSIS — G894 Chronic pain syndrome: Secondary | ICD-10-CM | POA: Diagnosis not present

## 2016-07-19 DIAGNOSIS — R079 Chest pain, unspecified: Secondary | ICD-10-CM

## 2016-07-19 DIAGNOSIS — L409 Psoriasis, unspecified: Secondary | ICD-10-CM | POA: Diagnosis not present

## 2016-07-19 DIAGNOSIS — I252 Old myocardial infarction: Secondary | ICD-10-CM | POA: Diagnosis not present

## 2016-07-19 DIAGNOSIS — E7800 Pure hypercholesterolemia, unspecified: Secondary | ICD-10-CM | POA: Diagnosis not present

## 2016-07-19 DIAGNOSIS — M797 Fibromyalgia: Secondary | ICD-10-CM | POA: Diagnosis not present

## 2016-07-19 MED ORDER — ROSUVASTATIN CALCIUM 10 MG PO TABS: 20.0000 mg | ORAL_TABLET | Freq: Every day | ORAL | Status: DC

## 2016-07-19 MED ORDER — KETOROLAC TROMETHAMINE 10 MG PO TABS: 10.0000 mg | ORAL_TABLET | Freq: Three times a day (TID) | ORAL | 0 refills | Status: DC | PRN

## 2016-07-19 MED ORDER — ROSUVASTATIN CALCIUM 20 MG PO TABS: 20.0000 mg | ORAL_TABLET | Freq: Every day | ORAL | 0 refills | Status: DC

## 2016-07-19 NOTE — Consult Note (Signed)
CARDIOLOGY CONSULT NOTE  Patient ID: Carolyn Gaines MRN: [id number] DOB/AGE: 05-Jun-1966 50 y.o.  Admit date: 07/17/2016 Referring Physician  [facility] Primary Physician:  Pcp Not In System Reason for Consultation  Chest pain  HPI: Carolyn Gaines  is a 50 y.o. female  With history of acute inferior STEMI on 02/19/2016 for which underwent emergent PCI to the right coronary artery with implantation of drug-eluting stent. She hasn't residual 60-70% mid LAD stenosis. Other history includes anxiety, depression, fibromyalgia, osteoarthritis. She is now admitted to the hospital with chest tightness, sharp pain under the left breast area. She was admitted to the hospital with similar episode of chest pain on 07/03/2016 and discharged home.  She used smoke cigarettes the past occasionally, which is completely quit.  No other associated symptoms of chest pain. She is wondering whether her chest pain may be related to peptic ulcer disease.  Past Medical History:  Diagnosis Date  . Anxiety   . Arthritis   . Depression   . Fibromyalgia   . MI (myocardial infarction) 01/2016     Past Surgical History:  Procedure Laterality Date  . ABDOMINAL HYSTERECTOMY    . CARDIAC CATHETERIZATION N/A 02/19/2016   Procedure: Left Heart Cath and Coronary Angiography;  Surgeon: Yates Decamp, MD;  Location: [facility] INVASIVE CV LAB;  Service: Cardiovascular;  Laterality: N/A;  . CARDIAC CATHETERIZATION N/A 02/19/2016   Procedure: Coronary Stent Intervention;  Surgeon: Yates Decamp, MD;  Location: [facility] INVASIVE CV LAB;  Service: Cardiovascular;  Laterality: N/A;  . cesarian       History reviewed. No pertinent family history. There is no immature coronary artery disease in her family.  Social History: Social History   Social History  . Marital status: Married    Spouse name: N/A  . Number of children: N/A  . Years of education: N/A   Occupational History  . Not on file.   Social History Main Topics  . Smoking status: Former  Smoker    Quit date: 03/21/2011  . Smokeless tobacco: Never Used  . Alcohol use No  . Drug use: No  . Sexual activity: Not on file   Other Topics Concern  . Not on file   Social History Narrative  . No narrative on file     Prescriptions Prior to Admission  Medication Sig Dispense Refill Last Dose  . acetaminophen (TYLENOL) 500 MG tablet Take 1,000 mg by mouth every 6 (six) hours as needed for headache.   Past Week at Unknown time  . ALPRAZolam (XANAX) 1 MG tablet Take 1 mg by mouth at bedtime.  0 07/16/2016 at Unknown time  . aspirin EC 81 MG EC tablet Take 1 tablet (81 mg total) by mouth daily. 30 tablet 11 07/17/2016 at Unknown time  . atorvastatin (LIPITOR) 80 MG tablet Take 1 tablet (80 mg total) by mouth daily at 6 PM. 30 tablet 1 07/16/2016 at Unknown time  . carvedilol (COREG) 3.125 MG tablet Take 3.125 mg by mouth 2 (two) times daily with a meal.   07/17/2016 at Unknown time  . escitalopram (LEXAPRO) 20 MG tablet Take 20 mg by mouth daily.  1 07/17/2016 at Unknown time  . fluocinonide-emollient (LIDEX-E) 0.05 % cream Apply 1 application topically 2 (two) times daily as needed (psoriasis).   Past Month at Unknown time  . nitroGLYCERIN (NITROSTAT) 0.4 MG SL tablet Place 1 tablet (0.4 mg total) under the tongue every 5 (five) minutes x 3 doses as needed for chest pain. 25  tablet 1 07/17/2016 at Unknown time  . ticagrelor (BRILINTA) 90 MG TABS tablet Take 1 tablet (90 mg total) by mouth 2 (two) times daily. 60 tablet 1 07/17/2016 at Unknown time  . traMADol (ULTRAM) 50 MG tablet Take 2 tablets (100 mg total) by mouth every 6 (six) hours as needed for moderate pain or severe pain. 15 tablet 0 Past Week at Unknown time     ROS: General: no fevers/chills/night sweats Eyes: no blurry vision, diplopia, or amaurosis ENT: no sore throat or hearing loss Resp: no dyspnea, no cough or hemoptysis. CV: no edema or palpitations GI: no abdominal pain, nausea, vomiting, diarrhea, or  constipation GU: no dysuria, frequency, or hematuria Skin: no rash Neuro: no focal neurologic deficits.  Musculoskeletal: no joint pain or swelling Heme: no bleeding, DVT, or easy bruising Endo: no polydipsia or polyuria    Physical Exam: Blood pressure 121/72, pulse 72, temperature 98.7 F (37.1 C), temperature source Oral, resp. rate 15, weight 76.8 kg (169 lb 4.8 oz), SpO2 97 %.   General appearance: alert, cooperative, appears stated age and no distress Lungs: clear to auscultation bilaterally Chest wall: left sided chest wall tenderness Heart: regular rate and rhythm, S1, S2 normal, no murmur, click, rub or gallop Abdomen: soft, non-tender; bowel sounds normal; no masses,  no organomegaly Extremities: extremities normal, atraumatic, no cyanosis or edema Pulses: 2+ and symmetric Neurologic: Grossly normal  Labs:   Lab Results  Component Value Date   WBC 3.8 (L) 07/18/2016   HGB 10.7 (L) 07/18/2016   HCT 34.2 (L) 07/18/2016   MCV 92.4 07/18/2016   PLT 176 07/18/2016     Recent Labs Lab 07/18/16 0512  NA 140  K 3.7  CL 115*  CO2 22  BUN 12  CREATININE 0.86  CALCIUM 8.3*  GLUCOSE 91    Lipid Panel     Component Value Date/Time   CHOL 162 02/19/2016 2017   TRIG 71 02/19/2016 2017   HDL 33 (L) 02/19/2016 2017   CHOLHDL 4.9 02/19/2016 2017   VLDL 14 02/19/2016 2017   LDLCALC 115 (H) 02/19/2016 2017    BNP (last 3 results)  Recent Labs  07/03/16 1535  BNP 45.0    Recent Labs  07/18/16 0138 07/18/16 0512 07/18/16 0820  TROPONINI <0.03 <0.03 <0.03    Recent Labs  02/20/16 0312  TSH 0.905    EKG: normal EKG, normal sinus rhythm, unchanged from previous tracings.  Outpatient Echocardiogram 03/21/2016 Left ventricle cavity is normal in size. Normal global wall motion. Normal diastolic filling pattern. Calculated EF 55%. Left atrial cavity is normal in size. Interatrial septal dropout without obvious PFO or ASD. Trace mitral regurgitation.  Trace tricuspid regurgitation. Unable to estimate PA pressure due to absence/minimal TR signal. Essentially normal echocardiogram  Coronary Angiogram 02/19/2016 Mid RCA 99% stenosed S/P 3.0 x 30 mm resolute DES reduced to 0%, TIMI-3 to TIMI-3 flow. Left main ostial 30-40%, mid LAD diffuse long segment disease of 20-30% followed by a focal 60-70% stenosis. Small ramus intermediate and circumflex without significant disease.   Outpatient Exercise sestamibi stress test 04/16/2016: 1. Resting EKG, etc. normal sinus rhythm, normal axis.  No evidence of ischemia, normal EKG. Stress EKG is negative for myocardial ischemia. Stress symptoms included dyspnea. Patient exercised on Bruce protocol for 4.00 minutes and achieved 5.80 METS. Stress test terminated due to 87% MPHR achieved (Target HR >85%). Markedly reduced aerobic tolerence.  2. The perfusion imaging study demonstrates a small sized basal septal scar with  very mild peri-infarct ischemia.  In addition there is a small to medium sized anteroapical and apical scar with no significant peri-infarct ischemia.  The left ventricular systolic function calculated by QGS was moderately depressed at 40% with global hypokinesis. This is an intermediate risk study, clinical correlation recommended.   Radiology: Dg Chest 2 View  Result Date: 07/17/2016 CLINICAL DATA:  50 year old female with acute left chest pain and shortness of breath. Initial encounter. Former smoker. EXAM: CHEST  2 VIEW COMPARISON:  07/03/2016 and earlier. FINDINGS: Normal cardiac size and mediastinal contours. Stable lung volumes. Mild diffuse increased pulmonary interstitial markings are stable and might reflect prior smoking. No pneumothorax, pulmonary edema, pleural effusion or confluent pulmonary opacity. Visualized tracheal air column is within normal limits. No acute osseous abnormality identified. Negative visible bowel gas pattern. IMPRESSION: No acute cardiopulmonary abnormality.  Electronically Signed   By: Odessa Fleming M.D.   On: 07/17/2016 21:58    Scheduled Meds: . ALPRAZolam  1 mg Oral QHS  . aspirin EC  81 mg Oral Daily  . atorvastatin  80 mg Oral q1800  . carvedilol  3.125 mg Oral BID WC  . enoxaparin (LOVENOX) injection  40 mg Subcutaneous Q24H  . escitalopram  20 mg Oral Daily  . ticagrelor  90 mg Oral BID   Continuous Infusions: . sodium chloride 75 mL/hr at 07/18/16 2217   PRN Meds:.acetaminophen, ALPRAZolam, fluocinonide-emollient, gi cocktail, ketorolac, methocarbamol (ROBAXIN)  IV, morphine injection, nitroGLYCERIN, ondansetron (ZOFRAN) IV, traMADol  ASSESSMENT AND PLAN:  1. Musculoskeletal chest apin and easily reproducible, no EKG abnormalities, cardiac markers are negative myocardial injury. 2. Hyperlipidemia, controlled 3. Coronary artery disease of native vessels, Coronary Angiogram 02/19/2016 Mid RCA 99% stenosed S/P 3.0 x 30 mm resolute DESLeft main ostial 30-40%, mid LAD diffuse long segment disease of 20-30% followed by a focal 60-70% stenosis. Small ramus intermediate and circumflex without significant disease. 4. Chronic  pain syndrome 5. Anxiety.  Recommendation: Patient's symptoms are easily reproducible with a normal EKG in spite of ongoing chest discomfort, negative markers. She can be discharged home, change lipitor to Crestor and CP could be related to myalgia from lipitor. I will f/u with her next week in office, has appointment on the 2th Jan 2018.    Yates Decamp, MD 07/19/2016, 9:06 AM [facility] Pager: [phone] Office: [phone] If no answer Cell [phone]

## 2016-07-19 NOTE — Progress Notes (Signed)
I reviewed d/c instructions with patient and gave her work note from MD. Pt d/c'd to private vehicle with son without complaints

## 2016-07-20 NOTE — Discharge Summary (Signed)
Physician Discharge Summary  Carolyn Gaines:[id number] DOB: [birth year] DOA: 07/17/2016  PCP: Pcp Not In System  Admit date: 07/17/2016 Discharge date: 07/19/2016  Admitted From: Home.  Disposition: Home.   Recommendations for Outpatient Follow-up:  1. Follow up with PCP in 1-2 weeks 2. Please obtain BMP/CBC in one week 3. Please follow up with psychiatry as outpatient 4. Please follow up with cardiology as recommended.     Discharge Condition: stable.  CODE STATUS: full code.  Diet recommendation: Heart Healthy   Brief/Interim Summary: Carolyn Tegeler Smithis a 50 y.o.femalewith medical history significant of CAD s/p PCI in July 2017, depressions/anxiety, psoriasis, and fibromyalgia; who presents with complaints of chest pain. Left sided under the left breast, worsening on palpation and deep breathing, sharp, not associated with sob or nausea or palpitations. troponis are negative. EKG does not show any ischemic changes. Last echo does not show any abnormality. She was ruled out for ACS. She was started on Toradol and robaxin and her pain improved.  Her lipitor was changed to crestor.   Discharge Diagnoses:  Principal Problem:   Chest pain Active Problems:   CAD (coronary artery disease), native coronary artery   Depression with anxiety   Normocytic anemia   Essential hypertension   Psoriasis  Hypertension: controlled.   Dehydration: hydrated with IV fluids   CAD s/p PCI in 01/2016.   Anxiety / depression:  Continue with lexapro.  Outpatient follow up with psychiatry .  No suicidal or homicidal ideation.    Psoriasis:  Continue with lidex.   Hyperlipidemia: in view of her musculoskeletal pain, her lipitor was changed to crestor on discharge.   Discharge Instructions  Discharge Instructions    Diet - low sodium heart healthy    Complete by:  As directed    Discharge instructions    Complete by:  As directed    Please follow up with PCP in one week.  Please  follow upw ith cardiology as recommended.     Allergies as of 07/19/2016      Reactions   Bee Venom Other (See Comments)   Pus sites at injection   Adhesive [tape] Itching, Swelling   Please use paper tape   Triple Antibiotic [bacitracin-neomycin-polymyxin] Rash      Medication List    STOP taking these medications   atorvastatin 80 MG tablet Commonly known as:  LIPITOR     TAKE these medications   acetaminophen 500 MG tablet Commonly known as:  TYLENOL Take 1,000 mg by mouth every 6 (six) hours as needed for headache.   ALPRAZolam 1 MG tablet Commonly known as:  XANAX Take 1 mg by mouth at bedtime.   aspirin 81 MG EC tablet Take 1 tablet (81 mg total) by mouth daily.   carvedilol 3.125 MG tablet Commonly known as:  COREG Take 3.125 mg by mouth 2 (two) times daily with a meal.   escitalopram 20 MG tablet Commonly known as:  LEXAPRO Take 20 mg by mouth daily.   fluocinonide-emollient 0.05 % cream Commonly known as:  LIDEX-E Apply 1 application topically 2 (two) times daily as needed (psoriasis).   ketorolac 10 MG tablet Commonly known as:  TORADOL Take 1 tablet (10 mg total) by mouth every 8 (eight) hours as needed for moderate pain.   nitroGLYCERIN 0.4 MG SL tablet Commonly known as:  NITROSTAT Place 1 tablet (0.4 mg total) under the tongue every 5 (five) minutes x 3 doses as needed for chest pain.   rosuvastatin  20 MG tablet Commonly known as:  CRESTOR Take 1 tablet (20 mg total) by mouth daily at 6 PM.   ticagrelor 90 MG Tabs tablet Commonly known as:  BRILINTA Take 1 tablet (90 mg total) by mouth 2 (two) times daily.   traMADol 50 MG tablet Commonly known as:  ULTRAM Take 2 tablets (100 mg total) by mouth every 6 (six) hours as needed for moderate pain or severe pain.       Allergies  Allergen Reactions  . Bee Venom Other (See Comments)    Pus sites at injection  . Adhesive [Tape] Itching and Swelling    Please use paper tape  . Triple  Antibiotic [Bacitracin-Neomycin-Polymyxin] Rash    Consultations:  Cardiology Dr Jacinto Halim.    Procedures/Studies: Dg Chest 2 View  Result Date: 07/17/2016 CLINICAL DATA:  50 year old female with acute left chest pain and shortness of breath. Initial encounter. Former smoker. EXAM: CHEST  2 VIEW COMPARISON:  07/03/2016 and earlier. FINDINGS: Normal cardiac size and mediastinal contours. Stable lung volumes. Mild diffuse increased pulmonary interstitial markings are stable and might reflect prior smoking. No pneumothorax, pulmonary edema, pleural effusion or confluent pulmonary opacity. Visualized tracheal air column is within normal limits. No acute osseous abnormality identified. Negative visible bowel gas pattern. IMPRESSION: No acute cardiopulmonary abnormality. Electronically Signed   By: Odessa Fleming M.D.   On: 07/17/2016 21:58   Dg Chest 2 View  Result Date: 07/03/2016 CLINICAL DATA:  Left-sided chest pain and pain the medial left breast radiating down left arm since last night. Productive cough. EXAM: CHEST  2 VIEW COMPARISON:  03/20/2016 CXR and CT FINDINGS: The heart size and mediastinal contours are within normal limits. Minimal blunting of the left costophrenic angle may represent a trace effusion or pleural thickening. Likely pleural thickening based on prior CT. Both lungs are clear. The visualized skeletal structures are unremarkable. IMPRESSION: No active cardiopulmonary disease. Minimal blunting of the left costophrenic angle may represent a trace effusion or slight pleural thickening, favor slight pleural thickening based on prior CT. Electronically Signed   By: Tollie Eth M.D.   On: 07/03/2016 16:43       Subjective: No new complaints  Discharge Exam: Vitals:   07/19/16 0735 07/19/16 1230  BP: 121/72 101/63  Pulse:  64  Resp: 15   Temp: 98.7 F (37.1 C) 98.1 F (36.7 C)   Vitals:   07/19/16 0006 07/19/16 0552 07/19/16 0735 07/19/16 1230  BP: 110/67 107/79 121/72 101/63   Pulse: 61 72  64  Resp: 12 15 15    Temp: 98.3 F (36.8 C) 98.3 F (36.8 C) 98.7 F (37.1 C) 98.1 F (36.7 C)  TempSrc: Oral Oral Oral Axillary  SpO2: 97% 98% 97% 98%  Weight:  76.8 kg (169 lb 4.8 oz)      General: Pt is alert, awake, not in acute distress Cardiovascular: RRR, S1/S2 +, no rubs, no gallops Respiratory: CTA bilaterally, no wheezing, no rhonchi Abdominal: Soft, NT, ND, bowel sounds + Extremities: no edema, no cyanosis    The results of significant diagnostics from this hospitalization (including imaging, microbiology, ancillary and laboratory) are listed below for reference.     Microbiology: Recent Results (from the past 240 hour(s))  MRSA PCR Screening     Status: None   Collection Time: 07/18/16  4:09 AM  Result Value Ref Range Status   MRSA by PCR NEGATIVE NEGATIVE Final    Comment:        The GeneXpert MRSA  Assay (FDA approved for NASAL specimens only), is one component of a comprehensive MRSA colonization surveillance program. It is not intended to diagnose MRSA infection nor to guide or monitor treatment for MRSA infections.      Labs: BNP (last 3 results)  Recent Labs  07/03/16 1535  BNP 45.0   Basic Metabolic Panel:  Recent Labs Lab 07/17/16 2207 07/18/16 0512  NA 141 140  K 4.2 3.7  CL 115* 115*  CO2 20* 22  GLUCOSE 100* 91  BUN 11 12  CREATININE 0.87 0.86  CALCIUM 8.3* 8.3*   Liver Function Tests: No results for input(s): AST, ALT, ALKPHOS, BILITOT, PROT, ALBUMIN in the last 168 hours. No results for input(s): LIPASE, AMYLASE in the last 168 hours. No results for input(s): AMMONIA in the last 168 hours. CBC:  Recent Labs Lab 07/17/16 2207 07/18/16 0512  WBC 4.1 3.8*  HGB 11.2* 10.7*  HCT 33.7* 34.2*  MCV 89.2 92.4  PLT 191 176   Cardiac Enzymes:  Recent Labs Lab 07/18/16 0138 07/18/16 0512 07/18/16 0820  TROPONINI <0.03 <0.03 <0.03   BNP: Invalid input(s): POCBNP CBG: No results for input(s): GLUCAP  in the last 168 hours. D-Dimer No results for input(s): DDIMER in the last 72 hours. Hgb A1c No results for input(s): HGBA1C in the last 72 hours. Lipid Profile No results for input(s): CHOL, HDL, LDLCALC, TRIG, CHOLHDL, LDLDIRECT in the last 72 hours. Thyroid function studies No results for input(s): TSH, T4TOTAL, T3FREE, THYROIDAB in the last 72 hours.  Invalid input(s): FREET3 Anemia work up No results for input(s): VITAMINB12, FOLATE, FERRITIN, TIBC, IRON, RETICCTPCT in the last 72 hours. Urinalysis No results found for: COLORURINE, APPEARANCEUR, LABSPEC, PHURINE, GLUCOSEU, HGBUR, BILIRUBINUR, KETONESUR, PROTEINUR, UROBILINOGEN, NITRITE, LEUKOCYTESUR Sepsis Labs Invalid input(s): PROCALCITONIN,  WBC,  LACTICIDVEN Microbiology Recent Results (from the past 240 hour(s))  MRSA PCR Screening     Status: None   Collection Time: 07/18/16  4:09 AM  Result Value Ref Range Status   MRSA by PCR NEGATIVE NEGATIVE Final    Comment:        The GeneXpert MRSA Assay (FDA approved for NASAL specimens only), is one component of a comprehensive MRSA colonization surveillance program. It is not intended to diagnose MRSA infection nor to guide or monitor treatment for MRSA infections.      Time coordinating discharge: Over 30 minutes  SIGNED:   Kathlen ModyAKULA,Devyon Keator, MD  [facility] 07/20/2016, 11:53 AM Pager   If 7PM-7AM, please contact night-coverage [email or web address] Password [email or web address]

## 2016-09-19 ENCOUNTER — Ambulatory Visit (INDEPENDENT_AMBULATORY_CARE_PROVIDER_SITE_OTHER): Payer: BLUE CROSS/BLUE SHIELD | Admitting: Physician Assistant

## 2016-09-19 ENCOUNTER — Encounter: Payer: Self-pay | Admitting: Physician Assistant

## 2016-09-19 VITALS — BP 106/68 | HR 67 | Ht 64.0 in | Wt 168.0 lb

## 2016-09-19 DIAGNOSIS — I10 Essential (primary) hypertension: Secondary | ICD-10-CM

## 2016-09-19 DIAGNOSIS — I2510 Atherosclerotic heart disease of native coronary artery without angina pectoris: Secondary | ICD-10-CM

## 2016-09-19 DIAGNOSIS — F418 Other specified anxiety disorders: Secondary | ICD-10-CM | POA: Diagnosis not present

## 2016-09-19 DIAGNOSIS — R079 Chest pain, unspecified: Secondary | ICD-10-CM

## 2016-09-19 MED ORDER — CARVEDILOL 3.125 MG PO TABS: 3.1250 mg | ORAL_TABLET | Freq: Two times a day (BID) | ORAL | 6 refills | Status: DC

## 2016-09-19 MED ORDER — ROSUVASTATIN CALCIUM 20 MG PO TABS: 20.0000 mg | ORAL_TABLET | Freq: Every day | ORAL | 6 refills | Status: DC

## 2016-09-19 MED ORDER — TICAGRELOR 90 MG PO TABS: 90.0000 mg | ORAL_TABLET | Freq: Two times a day (BID) | ORAL | 6 refills | Status: DC

## 2016-09-19 NOTE — Patient Instructions (Signed)
Your physician recommends that you schedule a follow-up appointment in: 3 Months with Dr. Wyline Mood  Your physician recommends that you continue on your current medications as directed. Please refer to the Current Medication list given to you today.  Take medications with food   If you need a refill on your cardiac medications before your next appointment, please call your pharmacy.  Thank you for choosing [facility]!

## 2016-09-19 NOTE — Progress Notes (Signed)
Cardiology Office Note    Date:  09/19/2016   ID:  Carolyn Gaines, DOB 1965/08/14, MRN [id number]  PCP:  Fredirick Maudlin, MD  Cardiologist: new, previous Dr. Jacinto Halim  Chief Complaint  Patient presents with  . Chest Pain    History of Present Illness:  Carolyn Gaines is a 51 y.o. female  With history of acute inferior STEMI on 02/19/2016 for which underwent emergent PCI/DES RCA. She has residual 60-70% mid LAD stenosis. Other history includes anxiety, depression, fibromyalgia, osteoarthritis.Readmitted 07/03/16 and 07/19/16 with recurrent chest pain felt to be atypical. Symptoms were reproducible, normal EKG and negative cardiac markers.  2-D echo 03/21/16 normal LVEF 55% trace of MR. Nuclear stress test 04/16/16 small basal septal scar with very mild peri-infarct ischemia, small to medium size anterior apical and apical scar with no significant peri-infarct ischemia, LVEF moderately depressed at 40% with global hypokinesis. Intermediate risk study.  Patient comes in today to establish care here in [place]. She is under a lot of stress and suffering from anxiety/depression. She is not being treated now but is establishing care with Dr. Juanetta Gosling in March. She is very tearful. She says she has chronic tightness in her upper abdomen. She takes all her medications on an empty stomach. She complains of chronic fatigue. 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 She denies any exertional chest pain, pressure, dizziness or presyncope. She says she gets short of breath at rest as she is talking to me but she becomes very anxious during this time. Lipitor was changed to Crestor because of muscle aches. Quit smoking about 5 years ago.  Past Medical History:  Diagnosis Date  . Anxiety   . Arthritis   . Depression   . Fibromyalgia   . MI (myocardial infarction) 01/2016    Past Surgical History:  Procedure Laterality Date  . ABDOMINAL HYSTERECTOMY    . CARDIAC  CATHETERIZATION N/A 02/19/2016   Procedure: Left Heart Cath and Coronary Angiography;  Surgeon: Yates Decamp, MD;  Location: [facility] INVASIVE CV LAB;  Service: Cardiovascular;  Laterality: N/A;  . CARDIAC CATHETERIZATION N/A 02/19/2016   Procedure: Coronary Stent Intervention;  Surgeon: Yates Decamp, MD;  Location: [facility] INVASIVE CV LAB;  Service: Cardiovascular;  Laterality: N/A;  . cesarian      Current Medications: Outpatient Medications Prior to Visit  Medication Sig Dispense Refill  . acetaminophen (TYLENOL) 500 MG tablet Take 1,000 mg by mouth every 6 (six) hours as needed for headache.    . ALPRAZolam (XANAX) 1 MG tablet Take 1 mg by mouth at bedtime.  0  . aspirin EC 81 MG EC tablet Take 1 tablet (81 mg total) by mouth daily. 30 tablet 11  . escitalopram (LEXAPRO) 20 MG tablet Take 20 mg by mouth daily.  1  . fluocinonide-emollient (LIDEX-E) 0.05 % cream Apply 1 application topically 2 (two) times daily as needed (psoriasis).    Marland Kitchen ketorolac (TORADOL) 10 MG tablet Take 1 tablet (10 mg total) by mouth every 8 (eight) hours as needed for moderate pain. 20 tablet 0  . nitroGLYCERIN (NITROSTAT) 0.4 MG SL tablet Place 1 tablet (0.4 mg total) under the tongue every 5 (five) minutes x 3 doses as needed for chest pain. 25 tablet 1  . traMADol (ULTRAM) 50 MG tablet Take 2 tablets (100 mg total) by mouth every 6 (six) hours as needed for moderate pain or severe pain. 15 tablet 0  .  carvedilol (COREG) 3.125 MG tablet Take 3.125 mg by mouth 2 (two) times daily with a meal.    . rosuvastatin (CRESTOR) 20 MG tablet Take 1 tablet (20 mg total) by mouth daily at 6 PM. 30 tablet 0  . ticagrelor (BRILINTA) 90 MG TABS tablet Take 1 tablet (90 mg total) by mouth 2 (two) times daily. 60 tablet 1   No facility-administered medications prior to visit.      Allergies:   Bee venom; Adhesive [tape]; and Triple antibiotic [bacitracin-neomycin-polymyxin]   Social History   Social History  . Marital status: Married     Spouse name: N/A  . Number of children: N/A  . Years of education: N/A   Social History Main Topics  . Smoking status: Former Smoker    Quit date: 03/21/2011  . Smokeless tobacco: Never Used  . Alcohol use No  . Drug use: No  . Sexual activity: Not Asked   Other Topics Concern  . None   Social History Narrative  . None     Family History:  [name] family history includes Arthritis in her mother; COPD in her mother; Heart disease in her mother; Psoriasis in her father.   ROS:   Please see the history of present illness.    Review of Systems  Constitution: Positive for weakness and malaise/fatigue.  HENT: Negative.   Eyes: Negative.   Cardiovascular: Negative.   Respiratory: Positive for shortness of breath.   Hematologic/Lymphatic: Bruises/bleeds easily.  Musculoskeletal: Positive for myalgias. Negative for joint pain.  Gastrointestinal: Positive for abdominal pain.  Genitourinary: Negative.   Psychiatric/Behavioral: Positive for depression. The patient is nervous/anxious.    All other systems reviewed and are negative.   PHYSICAL EXAM:   VS:  BP 106/68   Pulse 67   Ht 5\' 4"  (1.626 m)   Wt 168 lb (76.2 kg)   SpO2 95%   BMI 28.84 kg/m   Physical Exam  GEN: Well nourished, well developed, in no acute distress  Neck: no JVD, carotid bruits, or masses Cardiac:RRR; Positive S4 no murmurs, rubs Respiratory:  clear to auscultation bilaterally, normal work of breathing GI: soft, nontender, nondistended, + BS Ext: without cyanosis, clubbing, or edema, Good distal pulses bilaterally MS: no deformity or atrophy  Skin: warm and dry, no rash Neuro:  Alert and Oriented x 3, Strength and sensation are intact Psych: euthymic mood, full affect  Wt Readings from Last 3 Encounters:  09/19/16 168 lb (76.2 kg)  07/19/16 169 lb 4.8 oz (76.8 kg)  07/04/16 166 lb 12.8 oz (75.7 kg)      Studies/Labs Reviewed:   EKG:  EKG is  ordered today.  The ekg ordered today  demonstrates Normal sinus rhythm, no acute change  Recent Labs: 02/19/2016: ALT 14 02/20/2016: TSH 0.905 07/03/2016: B Natriuretic Peptide 45.0 07/18/2016: BUN 12; Creatinine, Ser 0.86; Hemoglobin 10.7; Platelets 176; Potassium 3.7; Sodium 140   Lipid Panel    Component Value Date/Time   CHOL 162 02/19/2016 2017   TRIG 71 02/19/2016 2017   HDL 33 (L) 02/19/2016 2017   CHOLHDL 4.9 02/19/2016 2017   VLDL 14 02/19/2016 2017   LDLCALC 115 (H) 02/19/2016 2017    Additional studies/ records that were reviewed today include:   Outpatient Echocardiogram  03/21/2016 Left ventricle cavity is normal in size. Normal global wall motion. Normal diastolic filling pattern. Calculated EF 55%. Left atrial cavity is normal in size. Interatrial septal dropout without obvious PFO or ASD. Trace mitral regurgitation. Trace  tricuspid regurgitation. Unable to estimate PA pressure due to absence/minimal TR signal. Essentially normal echocardiogram   Coronary Angiogram  02/19/2016 Mid RCA 99% stenosed S/P 3.0 x 30 mm resolute DES reduced to 0%, TIMI-3 to TIMI-3 flow. Left main ostial 30-40%, mid LAD diffuse long segment disease of 20-30% followed by a focal 60-70% stenosis. Small ramus intermediate and circumflex without significant disease.     ASSESSMENT:    1. Chest pain, unspecified type   2. Coronary artery disease involving native coronary artery of native heart, angina presence unspecified   3. Essential hypertension   4. Depression with anxiety      PLAN:  In order of problems listed above:  Chest and abdominal pain have been chronic since her MI. She takes all her medications on an empty stomach and is under an extreme amount of stress. She's had 2 hospitalizations in December with negative cardiac markers and EKGs. Mild peri-infarct ischemia on stress test in 03/2016. No exertional symptoms. Recommend taking her medications with food. Discussed with Dr. branch who concurs. Will follow up with Dr.  branch in 2 months.  CAD status post STEMI treated with DES to the RCA 01/2016 with residual 60-70% diffuse long LAD treated medically. Continue Brilinta, aspirin, Crestor and low-dose carvedilol.  Hypertension blood pressure is actually on the low side so can't titrate any medications up  Anxiety and depression suspect this is causing part of her extreme fatigue. Follow-up with Dr. Juanetta Gosling next month.   Medication Adjustments/Labs and Tests Ordered: Current medicines are reviewed at length with the patient today.  Concerns regarding medicines are outlined above.  Medication changes, Labs and Tests ordered today are listed in the Patient Instructions below. Patient Instructions  Your physician recommends that you schedule a follow-up appointment in: 3 Months with Dr. Wyline Mood  Your physician recommends that you continue on your current medications as directed. Please refer to the Current Medication list given to you today.  Take medications with food   If you need a refill on your cardiac medications before your next appointment, please call your pharmacy.  Thank you for choosing [facility]!      Signed, [name], PA-C  09/19/2016 1:33 PM    [facility] [address] [place], [place], [place]  [address] Phone: [phone]; Fax: [phone]

## 2016-10-12 ENCOUNTER — Encounter (HOSPITAL_COMMUNITY): Payer: Self-pay | Admitting: *Deleted

## 2016-10-12 ENCOUNTER — Emergency Department (HOSPITAL_COMMUNITY): Payer: BLUE CROSS/BLUE SHIELD

## 2016-10-12 ENCOUNTER — Emergency Department (HOSPITAL_COMMUNITY)
Admission: EM | Admit: 2016-10-12 | Discharge: 2016-10-12 | Disposition: A | Discharge status: Home / Self Care | Payer: BLUE CROSS/BLUE SHIELD | Attending: Emergency Medicine | Admitting: Emergency Medicine

## 2016-10-12 DIAGNOSIS — I2510 Atherosclerotic heart disease of native coronary artery without angina pectoris: Secondary | ICD-10-CM | POA: Diagnosis not present

## 2016-10-12 DIAGNOSIS — Z7982 Long term (current) use of aspirin: Secondary | ICD-10-CM | POA: Insufficient documentation

## 2016-10-12 DIAGNOSIS — I10 Essential (primary) hypertension: Secondary | ICD-10-CM | POA: Insufficient documentation

## 2016-10-12 DIAGNOSIS — J209 Acute bronchitis, unspecified: Secondary | ICD-10-CM | POA: Diagnosis not present

## 2016-10-12 DIAGNOSIS — R05 Cough: Secondary | ICD-10-CM | POA: Diagnosis not present

## 2016-10-12 DIAGNOSIS — N390 Urinary tract infection, site not specified: Secondary | ICD-10-CM | POA: Diagnosis not present

## 2016-10-12 DIAGNOSIS — Z79899 Other long term (current) drug therapy: Secondary | ICD-10-CM | POA: Insufficient documentation

## 2016-10-12 DIAGNOSIS — Z87891 Personal history of nicotine dependence: Secondary | ICD-10-CM | POA: Insufficient documentation

## 2016-10-12 LAB — URINALYSIS, MICROSCOPIC (REFLEX): RBC / HPF: NONE SEEN RBC/hpf (ref 0–5)

## 2016-10-12 LAB — URINALYSIS, ROUTINE W REFLEX MICROSCOPIC
BILIRUBIN URINE: NEGATIVE
Glucose, UA: NEGATIVE mg/dL
Hgb urine dipstick: NEGATIVE
KETONES UR: NEGATIVE mg/dL
NITRITE: NEGATIVE
Protein, ur: NEGATIVE mg/dL
Specific Gravity, Urine: 1.02 (ref 1.005–1.030)
pH: 6.5 (ref 5.0–8.0)

## 2016-10-12 MED ORDER — ALBUTEROL SULFATE HFA 108 (90 BASE) MCG/ACT IN AERS
2.0000 | INHALATION_SPRAY | Freq: Once | RESPIRATORY_TRACT | Status: AC
  Administered 2016-10-12: 2 via RESPIRATORY_TRACT
  Filled 2016-10-12: qty 6.7

## 2016-10-12 MED ORDER — CEPHALEXIN 500 MG PO CAPS
500.0000 mg | ORAL_CAPSULE | Freq: Once | ORAL | Status: AC
  Administered 2016-10-12: 500 mg via ORAL
  Filled 2016-10-12: qty 1

## 2016-10-12 MED ORDER — CEPHALEXIN 500 MG PO CAPS: 500.0000 mg | ORAL_CAPSULE | Freq: Four times a day (QID) | ORAL | 0 refills | Status: DC

## 2016-10-12 MED ORDER — DEXAMETHASONE 4 MG PO TABS: 4.0000 mg | ORAL_TABLET | Freq: Two times a day (BID) | ORAL | 0 refills | Status: DC

## 2016-10-12 MED ORDER — FLUCONAZOLE 100 MG PO TABS
150.0000 mg | ORAL_TABLET | Freq: Once | ORAL | Status: AC
  Administered 2016-10-12: 150 mg via ORAL
  Filled 2016-10-12: qty 2

## 2016-10-12 MED ORDER — MUPIROCIN CALCIUM 2 % NA OINT: TOPICAL_OINTMENT | NASAL | 1 refills | Status: DC

## 2016-10-12 NOTE — Discharge Instructions (Signed)
Please increase fluids. Use keflex and decadron daily.  Apply bactroban to the rash of the labia. Use 2 puffs of albuterol every 4 hours for breathing and cough. See Dr Juanetta Gosling for additional evalluation.

## 2016-10-12 NOTE — ED Provider Notes (Signed)
[facility]-EMERGENCY DEPT Provider Note   CSN: [id number] Arrival date & time: 10/12/16  0854     History   Chief Complaint Chief Complaint  Patient presents with  . Cough  . Vaginal Itching    HPI Carolyn Gaines is a 51 y.o. female.  Patient is a 51 year old female who presents to the emergency department with a complaint of cough, congestion, and cyst or boils in the vaginal area.  The patient states she has had multiple episodes of upper respiratory related symptoms since Christmas. This latest episode started approximately one week ago with upper respiratory symptoms. She states that over the last couple of days she's been doing so much coughing that her chest and ribs are sore. She states that at time she's bringing up a green to brownish-looking material. She states that her employer sent her home because of excessive coughing. She's had some intermittent episodes of temperature elevation, she's also had some subjective of episodes with fever and chills. There's been no hemoptysis reported. No recent operations or procedures involving the chest or lungs.  The patient states that from time to time she gets problems with vaginal itching. She now has irritation of the vaginal area. She states these are not responding to over-the-counter medications, and she would like to have them checked.   The history is provided by the patient.  Cough   Vaginal Itching     Past Medical History:  Diagnosis Date  . Anxiety   . Arthritis   . Depression   . Fibromyalgia   . MI (myocardial infarction) 01/2016    Patient Active Problem List   Diagnosis Date Noted  . Essential hypertension 07/18/2016  . Psoriasis 07/18/2016  . Chest pain 07/03/2016  . Depression with anxiety 07/03/2016  . Normocytic anemia 07/03/2016  . Acute MI, inferior wall, initial episode of care (HCC) 02/19/2016  . CAD (coronary artery disease), native coronary artery 02/19/2016    Past Surgical History:  Procedure  Laterality Date  . ABDOMINAL HYSTERECTOMY    . CARDIAC CATHETERIZATION N/A 02/19/2016   Procedure: Left Heart Cath and Coronary Angiography;  Surgeon: Yates Decamp, MD;  Location: [facility] INVASIVE CV LAB;  Service: Cardiovascular;  Laterality: N/A;  . CARDIAC CATHETERIZATION N/A 02/19/2016   Procedure: Coronary Stent Intervention;  Surgeon: Yates Decamp, MD;  Location: [facility] INVASIVE CV LAB;  Service: Cardiovascular;  Laterality: N/A;  . cesarian      OB History    No data available       Home Medications    Prior to Admission medications   Medication Sig Start Date End Date Taking? Authorizing Provider  acetaminophen (TYLENOL) 500 MG tablet Take 1,000 mg by mouth every 6 (six) hours as needed for headache.   Yes Historical Provider, MD  aspirin EC 81 MG EC tablet Take 1 tablet (81 mg total) by mouth daily. 02/20/16  Yes Marcy Salvo, NP  carvedilol (COREG) 3.125 MG tablet Take 1 tablet (3.125 mg total) by mouth 2 (two) times daily with a meal. 09/19/16  Yes Dyann Kief, PA-C  escitalopram (LEXAPRO) 20 MG tablet Take 20 mg by mouth daily. 03/31/16  Yes Historical Provider, MD  nitroGLYCERIN (NITROSTAT) 0.4 MG SL tablet Place 1 tablet (0.4 mg total) under the tongue every 5 (five) minutes x 3 doses as needed for chest pain. 02/20/16  Yes Marcy Salvo, NP  rosuvastatin (CRESTOR) 20 MG tablet Take 1 tablet (20 mg total) by mouth daily at 6 PM. 09/19/16  Yes Tarri Abernethy  Lenze, PA-C  ticagrelor (BRILINTA) 90 MG TABS tablet Take 1 tablet (90 mg total) by mouth 2 (two) times daily. 09/19/16  Yes Dyann Kief, PA-C  ketorolac (TORADOL) 10 MG tablet Take 1 tablet (10 mg total) by mouth every 8 (eight) hours as needed for moderate pain. Patient not taking: Reported on 10/12/2016 07/19/16   Kathlen Mody, MD  traMADol (ULTRAM) 50 MG tablet Take 2 tablets (100 mg total) by mouth every 6 (six) hours as needed for moderate pain or severe pain. Patient not taking: Reported on 10/12/2016 07/04/16   Joseph Art,  DO    Family History Family History  Problem Relation Age of Onset  . COPD Mother   . Arthritis Mother   . Heart disease Mother   . Psoriasis Father     Social History Social History  Substance Use Topics  . Smoking status: Former Smoker    Quit date: 03/21/2011  . Smokeless tobacco: Never Used  . Alcohol use No     Allergies   Bee venom; Adhesive [tape]; and Triple antibiotic [bacitracin-neomycin-polymyxin]   Review of Systems Review of Systems  HENT: Positive for congestion.   Respiratory: Positive for cough.   Genitourinary: Negative for vaginal discharge.       Vaginal itching  All other systems reviewed and are negative.    Physical Exam Updated Vital Signs BP 130/72 (BP Location: Right Arm)   Pulse 64   Temp 98.2 F (36.8 C) (Oral)   Resp 18   Ht 5\' 4"  (1.626 m)   Wt 76.2 kg   SpO2 96%   BMI 28.84 kg/m   Physical Exam  Constitutional: She is oriented to person, place, and time. She appears well-developed and well-nourished.  Non-toxic appearance.  HENT:  Head: Normocephalic.  Right Ear: Tympanic membrane and external ear normal.  Left Ear: Tympanic membrane and external ear normal.  Nasal congestion present. There is no pain to percussion of the sinuses.  Eyes: EOM and lids are normal. Pupils are equal, round, and reactive to light.  Neck: Normal range of motion. Neck supple. Carotid bruit is not present.  Cardiovascular: Normal rate, regular rhythm, normal heart sounds, intact distal pulses and normal pulses.   Pulmonary/Chest: Breath sounds normal. No respiratory distress.  There is symmetrical rise and fall of the chest. The patient speaks in complete sentences. There are few scattered rhonchi that clear with cough.  Abdominal: Soft. Bowel sounds are normal. There is no tenderness. There is no guarding.  Genitourinary:  Genitourinary Comments: Chaperone was present during the examination. There is increased redness noted of the outer labia. There  are a few small raised bumps on the external labia. No visible abscess noted at this time.  Musculoskeletal: Normal range of motion.  Lymphadenopathy:       Head (right side): No submandibular adenopathy present.       Head (left side): No submandibular adenopathy present.    She has no cervical adenopathy.  Neurological: She is alert and oriented to person, place, and time. She has normal strength. No cranial nerve deficit or sensory deficit.  Skin: Skin is warm and dry.  Psychiatric: She has a normal mood and affect. Her speech is normal.  Nursing note and vitals reviewed.    ED Treatments / Results  Labs (all labs ordered are listed, but only abnormal results are displayed) Labs Reviewed  URINALYSIS, ROUTINE W REFLEX MICROSCOPIC - Abnormal; Notable for the following:       Result  Value   Leukocytes, UA TRACE (*)    All other components within normal limits  URINALYSIS, MICROSCOPIC (REFLEX) - Abnormal; Notable for the following:    Bacteria, UA MANY (*)    Squamous Epithelial / LPF 0-5 (*)    All other components within normal limits    EKG  EKG Interpretation None       Radiology No results found.  Procedures Procedures (including critical care time)  Medications Ordered in ED Medications - No data to display   Initial Impression / Assessment and Plan / ED Course  I have reviewed the triage vital signs and the nursing notes.  Pertinent labs & imaging results that were available during my care of the patient were reviewed by me and considered in my medical decision making (see chart for details).     **I have reviewed nursing notes, vital signs, and all appropriate lab and imaging results for this patient.*  Final Clinical Impressions(s) / ED Diagnoses MDM Vital signs within normal limits. Urine shows trace leukocyte esterase, and many bacteria.  Urinalysis shows a trace of leukocyte esterase, and many bacteria. It is chest x-ray is negative for acute  problem.  The examination suggest acute bronchitis and urinary tract infection. Also seems to be a folliculitis of the labial area. Patient will be treated with Keflex, Decadron, and Bactroban. A culture the urine has been sent to the lab.    Final diagnoses:  Acute bronchitis, unspecified organism  Urinary tract infection without hematuria, site unspecified    New Prescriptions Discharge Medication List as of 10/12/2016 11:43 AM    START taking these medications   Details  cephALEXin (KEFLEX) 500 MG capsule Take 1 capsule (500 mg total) by mouth 4 (four) times daily., Starting Fri 10/12/2016, Print    dexamethasone (DECADRON) 4 MG tablet Take 1 tablet (4 mg total) by mouth 2 (two) times daily with a meal., Starting Fri 10/12/2016, Print    mupirocin nasal ointment (BACTROBAN) 2 % Apply rash bid, Print         Ivery Quale, PA-C 10/15/16 1305    Samuel Jester, DO 10/17/16 1211

## 2016-10-12 NOTE — ED Triage Notes (Signed)
Pt comes in for two symptoms. Pt has a persistent cough and congestion for 1 week. She also comes in vaginal itching. States she has these boils/cysts that started 3 weeks ago.

## 2016-10-20 ENCOUNTER — Encounter (HOSPITAL_COMMUNITY): Payer: Self-pay

## 2016-10-20 ENCOUNTER — Emergency Department (HOSPITAL_COMMUNITY): Payer: BLUE CROSS/BLUE SHIELD

## 2016-10-20 ENCOUNTER — Inpatient Hospital Stay (HOSPITAL_COMMUNITY)
Admission: EM | Admit: 2016-10-20 | Discharge: 2016-10-31 | DRG: 234 | Disposition: A | Discharge status: Home / Self Care | Payer: BLUE CROSS/BLUE SHIELD | Attending: Surgery | Admitting: Surgery

## 2016-10-20 DIAGNOSIS — I10 Essential (primary) hypertension: Secondary | ICD-10-CM | POA: Diagnosis present

## 2016-10-20 DIAGNOSIS — Z4682 Encounter for fitting and adjustment of non-vascular catheter: Secondary | ICD-10-CM | POA: Diagnosis not present

## 2016-10-20 DIAGNOSIS — I2510 Atherosclerotic heart disease of native coronary artery without angina pectoris: Secondary | ICD-10-CM | POA: Diagnosis not present

## 2016-10-20 DIAGNOSIS — I083 Combined rheumatic disorders of mitral, aortic and tricuspid valves: Secondary | ICD-10-CM | POA: Diagnosis not present

## 2016-10-20 DIAGNOSIS — Z7982 Long term (current) use of aspirin: Secondary | ICD-10-CM | POA: Diagnosis not present

## 2016-10-20 DIAGNOSIS — R072 Precordial pain: Secondary | ICD-10-CM | POA: Diagnosis not present

## 2016-10-20 DIAGNOSIS — D649 Anemia, unspecified: Secondary | ICD-10-CM

## 2016-10-20 DIAGNOSIS — I4891 Unspecified atrial fibrillation: Secondary | ICD-10-CM | POA: Diagnosis not present

## 2016-10-20 DIAGNOSIS — M797 Fibromyalgia: Secondary | ICD-10-CM | POA: Diagnosis not present

## 2016-10-20 DIAGNOSIS — D62 Acute posthemorrhagic anemia: Secondary | ICD-10-CM | POA: Diagnosis not present

## 2016-10-20 DIAGNOSIS — I252 Old myocardial infarction: Secondary | ICD-10-CM

## 2016-10-20 DIAGNOSIS — Z951 Presence of aortocoronary bypass graft: Secondary | ICD-10-CM

## 2016-10-20 DIAGNOSIS — F418 Other specified anxiety disorders: Secondary | ICD-10-CM | POA: Diagnosis not present

## 2016-10-20 DIAGNOSIS — E782 Mixed hyperlipidemia: Secondary | ICD-10-CM | POA: Diagnosis not present

## 2016-10-20 DIAGNOSIS — J069 Acute upper respiratory infection, unspecified: Secondary | ICD-10-CM | POA: Diagnosis not present

## 2016-10-20 DIAGNOSIS — Z955 Presence of coronary angioplasty implant and graft: Secondary | ICD-10-CM

## 2016-10-20 DIAGNOSIS — Z840 Family history of diseases of the skin and subcutaneous tissue: Secondary | ICD-10-CM

## 2016-10-20 DIAGNOSIS — J40 Bronchitis, not specified as acute or chronic: Secondary | ICD-10-CM | POA: Diagnosis not present

## 2016-10-20 DIAGNOSIS — Z91048 Other nonmedicinal substance allergy status: Secondary | ICD-10-CM

## 2016-10-20 DIAGNOSIS — Z881 Allergy status to other antibiotic agents status: Secondary | ICD-10-CM

## 2016-10-20 DIAGNOSIS — Z87891 Personal history of nicotine dependence: Secondary | ICD-10-CM | POA: Diagnosis not present

## 2016-10-20 DIAGNOSIS — R0789 Other chest pain: Secondary | ICD-10-CM | POA: Diagnosis not present

## 2016-10-20 DIAGNOSIS — R05 Cough: Secondary | ICD-10-CM | POA: Diagnosis not present

## 2016-10-20 DIAGNOSIS — Z7901 Long term (current) use of anticoagulants: Secondary | ICD-10-CM | POA: Diagnosis not present

## 2016-10-20 DIAGNOSIS — I25110 Atherosclerotic heart disease of native coronary artery with unstable angina pectoris: Secondary | ICD-10-CM | POA: Diagnosis not present

## 2016-10-20 DIAGNOSIS — E785 Hyperlipidemia, unspecified: Secondary | ICD-10-CM | POA: Diagnosis present

## 2016-10-20 DIAGNOSIS — Z01810 Encounter for preprocedural cardiovascular examination: Secondary | ICD-10-CM | POA: Diagnosis not present

## 2016-10-20 DIAGNOSIS — R079 Chest pain, unspecified: Secondary | ICD-10-CM | POA: Diagnosis not present

## 2016-10-20 DIAGNOSIS — R58 Hemorrhage, not elsewhere classified: Secondary | ICD-10-CM

## 2016-10-20 DIAGNOSIS — Z91030 Bee allergy status: Secondary | ICD-10-CM

## 2016-10-20 DIAGNOSIS — R0602 Shortness of breath: Secondary | ICD-10-CM

## 2016-10-20 DIAGNOSIS — F17211 Nicotine dependence, cigarettes, in remission: Secondary | ICD-10-CM

## 2016-10-20 DIAGNOSIS — J90 Pleural effusion, not elsewhere classified: Secondary | ICD-10-CM | POA: Diagnosis not present

## 2016-10-20 DIAGNOSIS — Z8249 Family history of ischemic heart disease and other diseases of the circulatory system: Secondary | ICD-10-CM

## 2016-10-20 DIAGNOSIS — R791 Abnormal coagulation profile: Secondary | ICD-10-CM | POA: Diagnosis not present

## 2016-10-20 DIAGNOSIS — R0902 Hypoxemia: Secondary | ICD-10-CM | POA: Diagnosis not present

## 2016-10-20 DIAGNOSIS — I200 Unstable angina: Secondary | ICD-10-CM | POA: Diagnosis present

## 2016-10-20 DIAGNOSIS — J9811 Atelectasis: Secondary | ICD-10-CM | POA: Diagnosis not present

## 2016-10-20 DIAGNOSIS — E8770 Fluid overload, unspecified: Secondary | ICD-10-CM | POA: Diagnosis not present

## 2016-10-20 DIAGNOSIS — Z79899 Other long term (current) drug therapy: Secondary | ICD-10-CM | POA: Diagnosis not present

## 2016-10-20 DIAGNOSIS — Z825 Family history of asthma and other chronic lower respiratory diseases: Secondary | ICD-10-CM

## 2016-10-20 LAB — I-STAT CG4 LACTIC ACID, ED: Lactic Acid, Venous: 0.87 mmol/L (ref 0.5–1.9)

## 2016-10-20 LAB — CBC
HCT: 34 % — ABNORMAL LOW (ref 36.0–46.0)
Hemoglobin: 11 g/dL — ABNORMAL LOW (ref 12.0–15.0)
MCH: 28.9 pg (ref 26.0–34.0)
MCHC: 32.4 g/dL (ref 30.0–36.0)
MCV: 89.2 fL (ref 78.0–100.0)
PLATELETS: 269 10*3/uL (ref 150–400)
RBC: 3.81 MIL/uL — AB (ref 3.87–5.11)
RDW: 15 % (ref 11.5–15.5)
WBC: 9 10*3/uL (ref 4.0–10.5)

## 2016-10-20 LAB — BASIC METABOLIC PANEL
Anion gap: 6 (ref 5–15)
BUN: 16 mg/dL (ref 6–20)
CALCIUM: 8.2 mg/dL — AB (ref 8.9–10.3)
CO2: 24 mmol/L (ref 22–32)
CREATININE: 0.82 mg/dL (ref 0.44–1.00)
Chloride: 109 mmol/L (ref 101–111)
GFR calc Af Amer: >60 mL/min (ref >60)
GFR calc non Af Amer: >60 mL/min (ref >60)
Glucose, Bld: 79 mg/dL (ref 65–99)
Potassium: 3.6 mmol/L (ref 3.5–5.1)
Sodium: 139 mmol/L (ref 135–145)

## 2016-10-20 LAB — I-STAT TROPONIN, ED: TROPONIN I, POC: 0 ng/mL (ref 0.00–0.08)

## 2016-10-20 LAB — PROTIME-INR
INR: 1.04
Prothrombin Time: 13.6 seconds (ref 11.4–15.2)

## 2016-10-20 LAB — TROPONIN I
Troponin I: <0.03 ng/mL (ref <0.03)
Troponin I: <0.03 ng/mL (ref <0.03)

## 2016-10-20 LAB — D-DIMER, QUANTITATIVE (NOT AT ARMC): D DIMER QUANT: 1.67 ug{FEU}/mL — AB (ref 0.00–0.50)

## 2016-10-20 MED ORDER — ALBUTEROL SULFATE (2.5 MG/3ML) 0.083% IN NEBU: 3.0000 mL | INHALATION_SOLUTION | RESPIRATORY_TRACT | Status: DC | PRN

## 2016-10-20 MED ORDER — ROSUVASTATIN CALCIUM 10 MG PO TABS
20.0000 mg | ORAL_TABLET | Freq: Every day | ORAL | Status: DC
  Administered 2016-10-20 – 2016-10-21 (×2): 20 mg via ORAL
  Filled 2016-10-20: qty 1
  Filled 2016-10-20 (×2): qty 2

## 2016-10-20 MED ORDER — CARVEDILOL 3.125 MG PO TABS
3.1250 mg | ORAL_TABLET | Freq: Two times a day (BID) | ORAL | Status: DC
  Administered 2016-10-20 – 2016-10-25 (×10): 3.125 mg via ORAL
  Filled 2016-10-20 (×11): qty 1

## 2016-10-20 MED ORDER — KETOROLAC TROMETHAMINE 30 MG/ML IJ SOLN
30.0000 mg | Freq: Three times a day (TID) | INTRAMUSCULAR | Status: AC | PRN
  Administered 2016-10-20 – 2016-10-22 (×5): 30 mg via INTRAVENOUS
  Filled 2016-10-20 (×5): qty 1

## 2016-10-20 MED ORDER — MORPHINE SULFATE (PF) 4 MG/ML IV SOLN
4.0000 mg | Freq: Once | INTRAVENOUS | Status: AC
  Administered 2016-10-20: 4 mg via INTRAVENOUS
  Filled 2016-10-20: qty 1

## 2016-10-20 MED ORDER — NITROGLYCERIN 0.4 MG SL SUBL: 0.4000 mg | SUBLINGUAL_TABLET | SUBLINGUAL | Status: DC | PRN

## 2016-10-20 MED ORDER — MORPHINE SULFATE (PF) 4 MG/ML IV SOLN
2.0000 mg | INTRAVENOUS | Status: DC | PRN
  Administered 2016-10-20 – 2016-10-26 (×11): 2 mg via INTRAVENOUS
  Filled 2016-10-20 (×13): qty 1

## 2016-10-20 MED ORDER — ACETAMINOPHEN 500 MG PO TABS
1000.0000 mg | ORAL_TABLET | Freq: Four times a day (QID) | ORAL | Status: DC | PRN
  Administered 2016-10-23: 1000 mg via ORAL
  Filled 2016-10-20: qty 2

## 2016-10-20 MED ORDER — SODIUM CHLORIDE 0.9 % IV BOLUS (SEPSIS)
1000.0000 mL | Freq: Once | INTRAVENOUS | Status: AC
  Administered 2016-10-20: 1000 mL via INTRAVENOUS

## 2016-10-20 MED ORDER — DICLOFENAC SODIUM 1 % TD GEL
2.0000 g | Freq: Four times a day (QID) | TRANSDERMAL | Status: DC
  Administered 2016-10-20 – 2016-10-23 (×8): 2 g via TOPICAL
  Filled 2016-10-20 (×3): qty 100

## 2016-10-20 MED ORDER — ASPIRIN EC 325 MG PO TBEC: 325.0000 mg | DELAYED_RELEASE_TABLET | Freq: Every day | ORAL | Status: DC

## 2016-10-20 MED ORDER — TICAGRELOR 90 MG PO TABS
90.0000 mg | ORAL_TABLET | Freq: Two times a day (BID) | ORAL | Status: DC
  Administered 2016-10-20 – 2016-10-22 (×4): 90 mg via ORAL
  Filled 2016-10-20 (×4): qty 1

## 2016-10-20 MED ORDER — ONDANSETRON HCL 4 MG/2ML IJ SOLN: 4.0000 mg | Freq: Four times a day (QID) | INTRAMUSCULAR | Status: DC | PRN

## 2016-10-20 MED ORDER — ENOXAPARIN SODIUM 80 MG/0.8ML ~~LOC~~ SOLN
1.0000 mg/kg | Freq: Two times a day (BID) | SUBCUTANEOUS | Status: DC
  Administered 2016-10-20 – 2016-10-21 (×2): 75 mg via SUBCUTANEOUS
  Filled 2016-10-20 (×2): qty 0.8

## 2016-10-20 MED ORDER — ESCITALOPRAM OXALATE 10 MG PO TABS
20.0000 mg | ORAL_TABLET | Freq: Every day | ORAL | Status: DC
  Administered 2016-10-21 – 2016-10-25 (×5): 20 mg via ORAL
  Filled 2016-10-20 (×5): qty 2

## 2016-10-20 NOTE — ED Triage Notes (Signed)
Pt from work with sudden onset chest pain. Pt has history of MI. Pt given 2 nitros and 324mg  ASA with EMS and pain now down from 10 to 5.

## 2016-10-20 NOTE — ED Provider Notes (Signed)
[facility]-EMERGENCY DEPT Provider Note   CSN: [id number] Arrival date & time: 10/20/16  1339     History   Chief Complaint Chief Complaint  Patient presents with  . Chest Pain    HPI Carolyn Gaines is a 51 y.o. female with a past medical history significant for CAD with MI status post stenting last year presents with chest pain. Patient reports that her pain is "the same as my heart attack". She describes it as a pressure sitting on her chest. She describes left arm numbness and pain. She reports associated lightheadedness and near-syncope. She reports associated shortness of breath. She denies nausea or vomiting. She does report that she had bronchitis several weeks ago and has some persistent cough but no fevers or chills. She denies any recent traumas. She says exertion makes her symptoms worse. The pain is also pleuritic and worsened with deep breathing. Patient has some tenderness on her chest. Patient says that she is taking all her medications as directed including aspirin Brilinta. She denies any other symptoms including no cuts pain, diarrhea, or dysuria.  Ports that she took nitroglycerin and aspirin prior to arriving. The chest pain improved with nitroglycerin from a 10 to a 5 out of 10.     The history is provided by the patient and medical records.  Chest Pain   This is a recurrent problem. The current episode started 3 to 5 hours ago. The problem occurs constantly. The problem has been gradually improving. The pain is associated with exertion and breathing. The pain is present in the substernal region. The pain is at a severity of 10/10. The pain is severe. The quality of the pain is described as exertional, heavy, pleuritic and pressure-like. The pain radiates to the left arm. The symptoms are aggravated by deep breathing and exertion. Associated symptoms include cough (several weeks), exertional chest pressure, near-syncope and shortness of breath. Pertinent negatives include no  abdominal pain, no back pain, no diaphoresis, no dizziness, no fever, no headaches, no irregular heartbeat, no leg pain, no lower extremity edema, no nausea, no numbness, no palpitations, no sputum production, no vomiting and no weakness.  Her past medical history is significant for MI.    Past Medical History:  Diagnosis Date  . Anxiety   . Arthritis   . Depression   . Fibromyalgia   . MI (myocardial infarction) 01/2016    Patient Active Problem List   Diagnosis Date Noted  . Essential hypertension 07/18/2016  . Psoriasis 07/18/2016  . Chest pain 07/03/2016  . Depression with anxiety 07/03/2016  . Normocytic anemia 07/03/2016  . Acute MI, inferior wall, initial episode of care (HCC) 02/19/2016  . CAD (coronary artery disease), native coronary artery 02/19/2016    Past Surgical History:  Procedure Laterality Date  . ABDOMINAL HYSTERECTOMY    . CARDIAC CATHETERIZATION N/A 02/19/2016   Procedure: Left Heart Cath and Coronary Angiography;  Surgeon: Yates Decamp, MD;  Location: [facility] INVASIVE CV LAB;  Service: Cardiovascular;  Laterality: N/A;  . CARDIAC CATHETERIZATION N/A 02/19/2016   Procedure: Coronary Stent Intervention;  Surgeon: Yates Decamp, MD;  Location: [facility] INVASIVE CV LAB;  Service: Cardiovascular;  Laterality: N/A;  . cesarian      OB History    No data available       Home Medications    Prior to Admission medications   Medication Sig Start Date End Date Taking? Authorizing Provider  acetaminophen (TYLENOL) 500 MG tablet Take 1,000 mg by mouth every 6 (six)  hours as needed for headache.   Yes Historical Provider, MD  aspirin EC 81 MG EC tablet Take 1 tablet (81 mg total) by mouth daily. 02/20/16  Yes Marcy Salvo, NP  carvedilol (COREG) 3.125 MG tablet Take 1 tablet (3.125 mg total) by mouth 2 (two) times daily with a meal. 09/19/16  Yes Dyann Kief, PA-C  escitalopram (LEXAPRO) 20 MG tablet Take 20 mg by mouth daily. 03/31/16  Yes Historical Provider, MD  mupirocin  nasal ointment (BACTROBAN) 2 % Apply rash bid 10/12/16  Yes Ivery Quale, PA-C  nitroGLYCERIN (NITROSTAT) 0.4 MG SL tablet Place 1 tablet (0.4 mg total) under the tongue every 5 (five) minutes x 3 doses as needed for chest pain. 02/20/16  Yes Marcy Salvo, NP  rosuvastatin (CRESTOR) 20 MG tablet Take 1 tablet (20 mg total) by mouth daily at 6 PM. 09/19/16  Yes Dyann Kief, PA-C  ticagrelor (BRILINTA) 90 MG TABS tablet Take 1 tablet (90 mg total) by mouth 2 (two) times daily. 09/19/16  Yes Dyann Kief, PA-C  cephALEXin (KEFLEX) 500 MG capsule Take 1 capsule (500 mg total) by mouth 4 (four) times daily. Patient not taking: Reported on 10/20/2016 10/12/16   Ivery Quale, PA-C  dexamethasone (DECADRON) 4 MG tablet Take 1 tablet (4 mg total) by mouth 2 (two) times daily with a meal. Patient not taking: Reported on 10/20/2016 10/12/16   Ivery Quale, PA-C  ketorolac (TORADOL) 10 MG tablet Take 1 tablet (10 mg total) by mouth every 8 (eight) hours as needed for moderate pain. Patient not taking: Reported on 10/12/2016 07/19/16   Kathlen Mody, MD  traMADol (ULTRAM) 50 MG tablet Take 2 tablets (100 mg total) by mouth every 6 (six) hours as needed for moderate pain or severe pain. Patient not taking: Reported on 10/12/2016 07/04/16   Joseph Art, DO    Family History Family History  Problem Relation Age of Onset  . COPD Mother   . Arthritis Mother   . Heart disease Mother   . Psoriasis Father     Social History Social History  Substance Use Topics  . Smoking status: Former Smoker    Quit date: 03/21/2011  . Smokeless tobacco: Never Used  . Alcohol use No     Allergies   Bee venom; Adhesive [tape]; and Triple antibiotic [bacitracin-neomycin-polymyxin]   Review of Systems Review of Systems  Constitutional: Negative for activity change, chills, diaphoresis, fatigue and fever.  HENT: Negative for congestion and rhinorrhea.   Eyes: Negative for visual disturbance.  Respiratory:  Positive for cough (several weeks) and shortness of breath. Negative for sputum production, chest tightness and stridor.   Cardiovascular: Positive for chest pain and near-syncope. Negative for palpitations and leg swelling.  Gastrointestinal: Negative for abdominal distention, abdominal pain, constipation, diarrhea, nausea and vomiting.  Genitourinary: Negative for difficulty urinating, dysuria, flank pain, frequency, hematuria, menstrual problem, pelvic pain, vaginal bleeding and vaginal discharge.  Musculoskeletal: Negative for back pain and neck pain.  Skin: Negative for rash and wound.  Neurological: Negative for dizziness, weakness, light-headedness, numbness and headaches.  Psychiatric/Behavioral: Negative for agitation and confusion.  All other systems reviewed and are negative.    Physical Exam Updated Vital Signs BP 105/68   Pulse (!) 59   Temp 97.9 F (36.6 C) (Oral)   Resp 15   Ht 5\' 4"  (1.626 m)   Wt 168 lb (76.2 kg)   SpO2 100%   BMI 28.84 kg/m   Physical Exam  Constitutional: She  is oriented to person, place, and time. She appears well-developed and well-nourished. No distress.  HENT:  Head: Normocephalic and atraumatic.  Right Ear: External ear normal.  Left Ear: External ear normal.  Nose: Nose normal.  Mouth/Throat: Oropharynx is clear and moist. No oropharyngeal exudate.  Eyes: Conjunctivae and EOM are normal. Pupils are equal, round, and reactive to light.  Neck: Normal range of motion. Neck supple.  Cardiovascular: Normal rate, regular rhythm, normal heart sounds and intact distal pulses.   No murmur heard. Pulmonary/Chest: Effort normal and breath sounds normal. No stridor. No respiratory distress. She has no wheezes. She exhibits tenderness.    Abdominal: She exhibits no distension. There is no tenderness. There is no rebound.  Musculoskeletal: She exhibits no tenderness.  Neurological: She is alert and oriented to person, place, and time. She has  normal reflexes. No cranial nerve deficit or sensory deficit. She exhibits normal muscle tone. Coordination normal. GCS eye subscore is 4. GCS verbal subscore is 5. GCS motor subscore is 6.  Skin: Skin is warm. Capillary refill takes less than 2 seconds. No rash noted. She is not diaphoretic. No erythema.  Psychiatric: She has a normal mood and affect.  Nursing note and vitals reviewed.    ED Treatments / Results  Labs (all labs ordered are listed, but only abnormal results are displayed) Labs Reviewed  BASIC METABOLIC PANEL - Abnormal; Notable for the following:       Result Value   Calcium 8.2 (*)    All other components within normal limits  CBC - Abnormal; Notable for the following:    RBC 3.81 (*)    Hemoglobin 11.0 (*)    HCT 34.0 (*)    All other components within normal limits  D-DIMER, QUANTITATIVE (NOT AT [facility])  PROTIME-INR  I-STAT TROPOININ, ED  I-STAT CG4 LACTIC ACID, ED    EKG  EKG Interpretation  Date/Time:  Saturday October 20 2016 13:48:50 EDT Ventricular Rate:  55 PR Interval:    QRS Duration: 77 QT Interval:  427 QTC Calculation: 409 R Axis:   69 Text Interpretation:  Sinus rhythm when compared to prior, no significant changes seen,.  No STEMI Confirmed by Rush Landmark MD, Mellonie Guess [phone]) on 10/20/2016 3:01:37 PM       Radiology Dg Chest 2 View  Result Date: 10/20/2016 CLINICAL DATA:  Sudden onset of chest pain starting today at work, history of MI EXAM: CHEST  2 VIEW COMPARISON:  10/12/2016 FINDINGS: Cardiomediastinal silhouette is stable. No infiltrate or pleural effusion. No pulmonary edema. Bony thorax is unremarkable. IMPRESSION: No active cardiopulmonary disease. Electronically Signed   By: Natasha Mead M.D.   On: 10/20/2016 14:50    Procedures Procedures (including critical care time)  Medications Ordered in ED Medications  morphine 4 MG/ML injection 4 mg (4 mg Intravenous Given 10/20/16 1503)  sodium chloride 0.9 % bolus 1,000 mL (1,000 mLs  Intravenous New Bag/Given 10/20/16 1503)     Initial Impression / Assessment and Plan / ED Course  I have reviewed the triage vital signs and the nursing notes.  Pertinent labs & imaging results that were available during my care of the patient were reviewed by me and considered in my medical decision making (see chart for details).     Carolyn Gaines is a 51 y.o. female with a past medical history significant for CAD with MI status post stenting last year presents with chest pain.  History and exam are seen above.  On exam, patient's lungs  are clear. Patient's chest is tender to palpation across her chest. Patient has no abdominal tenderness. Patient has no focal neurologic deficits. Patient has no lower extremity edema. Patient's back is nontender. No CVA tenderness.   Initial EKG shows no changes from prior. Chest x-ray shows no acute cardio pulmonary disease. Laboratory testing showed initial troponin negative. D dimer ordered as well for CP and SOB.   Based on patient's chest pain similar to prior MI with associated symptoms, patient will need admission for high risk chest pain workup.     Final Clinical Impressions(s) / ED Diagnoses   Final diagnoses:  Precordial chest pain    Clinical Impression: 1. Precordial chest pain     Disposition: Admit to Internal medicine service     Canary Brim Aaryanna Hyden, MD 10/20/16 1754

## 2016-10-20 NOTE — ED Notes (Signed)
Admitting at bedside 

## 2016-10-20 NOTE — Progress Notes (Signed)
Pt C/O chest pressure 6/10, BP 98/56. Given morphine 2 mg with some relief. Dr. Criselda Peaches paged and notified. Acknowledged. No new orders received.

## 2016-10-20 NOTE — H&P (Signed)
Date: 10/20/2016               Patient Name:  Carolyn Gaines MRN: [id number]  DOB: 12/30/1965 Age / Sex: 51 y.o., female   PCP: Kari Baars, MD         Medical Service: Internal Medicine Teaching Service         Attending Physician: Dr. Inez Catalina, MD    First Contact: Havery Moros, MS3 Pager: [phone]  Second Contact: Third Contact: Dr. Toma Copier Augusten Lipkin Dr. Orest Dikes Pager: [phone]       After Hours (After 5p/  First Contact Pager: [phone]  weekends / holidays): Second Contact Pager: [phone]   Chief Complaint: chest pain  History of Present Illness:  Carolyn Gaines is a 51 year old female who presents for evaluation of sudden onset chest pain "that feels like my last heart attack." She has medical history for NSTEMI 01/2016 s/p DES RCA, HTN, prior tobacco abuse, and fibromyalgia. She was lifting boxes at work today when around noon she developed sudden onset severe retrosternal "burning" that felt like her prior MI. This was associated with diaphoresis and nausea. She sat down and rested without improvement and co-workers checked her BP which was "really high". They subsequently gave her nitro which took her pain from a 10 to a 5. She took aspirin prior to arrival in the ED. At time of admission she continued to have retrosternal chest burning which is improved when she puts pressure on her chest. Patient reports she's had several URI's over the past 3 months and was diagnosed with bronchitis 2 weeks ago for which she completed a course of abx. Reports she has been coughing so much over the past few weeks so that her ribs are sore.   In the ED, Temp of 97.9*, HR 59, BP 105/68, respirations 15 and she was saturating 100% on RA. She received ASA 325 mg en route. EKG with sinus bradycardia with rate of 55 without ST segment changes or TWI. Unchanged from prior. BMET wnl. CBC shows chronic stable anemia with Hb at 11. Troponin negative x2. D-dimer 1.67. CXR without acute  cardiopulmonary disease.   Meds:  Current Meds  Medication Sig  . acetaminophen (TYLENOL) 500 MG tablet Take 1,000 mg by mouth every 6 (six) hours as needed for headache.  . albuterol (PROVENTIL HFA;VENTOLIN HFA) 108 (90 Base) MCG/ACT inhaler Inhale 2 puffs into the lungs every 4 (four) hours as needed for wheezing or shortness of breath.  Marland Kitchen aspirin EC 81 MG EC tablet Take 1 tablet (81 mg total) by mouth daily.  . carvedilol (COREG) 3.125 MG tablet Take 1 tablet (3.125 mg total) by mouth 2 (two) times daily with a meal.  . escitalopram (LEXAPRO) 20 MG tablet Take 20 mg by mouth daily.  . mupirocin nasal ointment (BACTROBAN) 2 % Apply rash bid  . nitroGLYCERIN (NITROSTAT) 0.4 MG SL tablet Place 1 tablet (0.4 mg total) under the tongue every 5 (five) minutes x 3 doses as needed for chest pain.  . rosuvastatin (CRESTOR) 20 MG tablet Take 1 tablet (20 mg total) by mouth daily at 6 PM.  . ticagrelor (BRILINTA) 90 MG TABS tablet Take 1 tablet (90 mg total) by mouth 2 (two) times daily.   Allergies: Allergies as of 10/20/2016 - Review Complete 10/20/2016  Allergen Reaction Noted  . Bee venom Other (See Comments) 02/19/2016  . Adhesive [tape] Itching, Swelling, and Other (See Comments) 02/19/2016  . Triple antibiotic [  bacitracin-neomycin-polymyxin] Rash 02/19/2016   Past Medical History:  Diagnosis Date  . Anxiety   . Arthritis   . Depression   . Fibromyalgia   . MI (myocardial infarction) 01/2016   Family History: Mother: heart disease, COPD. Father has psoriasis but is otherwise healthy.   Social History: Former smoker, quit after MI in 2017. Reports occasional alcohol use and denied any drug use. She is married and lives at home with her husband who has chronic severe COPD who requires O2. Good sense of humor. Works at [employer].  Review of Systems: A complete ROS was negative except as per HPI.   Physical Exam: Blood pressure 109/66, pulse (!) 55, temperature 97.9 F (36.6 C),  temperature source Oral, resp. rate 17, height 5\' 4"  (1.626 m), weight 168 lb (76.2 kg), SpO2 99 %. General: Anxious middle aged woman resting in comfortably in bed. Pleasant and cooperative.  HENT: PERRL. EOMI. No conjunctival injection, icterus or ptosis. Oropharynx clear, mucous membranes moist.  Cardiovascular: Regular rate and rhythm. No murmur or rub appreciated. No gallop appreciated. Very TTP overlaying lower sternum.  Pulmonary: CTA BL, no wheezing, crackles or rhonchi appreciated. Unlabored breathing.  Abdomen: Soft, non-tender and non-distended. +bowel sounds.  Extremities: No peripheral edema noted BL. Intact distal pulses. No gross deformities. TTP throughout which pt attributes to fibromyalgia.  Skin: Warm, dry. No cyanosis.  Neuro: Strength and sensation symmetric and grossly intact.  Psych: Mood anxious and affect was mood congruent. Responds to questions appropriately. Good historian.  EKG: NSR with rate of 55. No ST segment changes or new TWI. Unchanged from prior.   CXR: No acute cardiopulmonary process.   Cath performed 01/2016 showed multivessel disease with 99% stenosis of right RCA which was successfully stented with DES.  Assessment & Plan by Problem: This is a 51 y/o F with hx of prior MI (01/2016 s/p DES RCA stent), prior tobacco abuse, HTN, depression, anxiety and fibromyalgia who presents following acute onset constant burning substernal chest pain which began while she was lifting a heavy box at work. CP is reproducible to palpation and was improved but not relieved entirely with nitro and morphine.   Principal Problem:   Chest pain Active Problems:   History of acute inferior wall MI   CAD (coronary artery disease), native coronary artery   Depression with anxiety   Essential hypertension   Fibromyalgia  Chest pain: Acute onset, constant, substernal burning pressure-like chest pain that began with exertion after lifting a heavy box. Improved but not resolved  after nitro. Very much so reproducible to palpation of lower sternum however patient has numerous RF including her recent MI 01/2016 with multivessel disease s/p stenting of right RCA. Chart review shows she has had several ED visits since that time with MSK-related CP with a similar presentation to todays. Still, given her RF's will need ACS r/u. Reports compliance with ASA, brillinta and crestor 20 mg. Trending troponin's, so far negative x2. Pt presented about 4 hrs after onset of CP and will trend troponins for extended cycle as she may have presented early enough for initial values to be normal. No echo in system. -?Unstable angina vs MSK pain  -Trend troponins, so far negative x2 -EKG without changes from prior, will repeat in AM -ECHO -Voltaren gel -Toradol PRN mod pain, Morphine PRN severe pain -ASA daily  Hypertension: Well controlled on coreg at home. BP here normotensive. Will continue home coreg 3.125 BID and monitor BP closely.   Chronic Normocytic Anemia: Hb  11.0. She denies any sources of blood loss. -Iron, TIBC, ferritin -Could consider B12 vs folate however normocytic  Elevated D-Dimer: Obtained by ED, 1.7. Pt denies any recent travel, surgeries, known malignancy, leg swelling or hemoptysis. She is not tachycardic, tachypnec, hypoxic or hypotensive. Modified geneva score 0. Wells score 0. Given low clinical suspicion, will not pursue PE work-up at this time.   Depression and Anxiety: Pt acknowledges that her presentation may be in some part 2/2 anxiety as shes had similar presentations before, attributed to MSK CP in setting of pts anxiety. Compliant with Lexapro 20 mg daily  Dispo: Admit patient to Observation with expected length of stay less than 2 midnights.  SignedNoemi Chapel, DO 10/20/2016, 3:30 PM  Pager: [phone]

## 2016-10-20 NOTE — H&P (Signed)
Date: 10/20/2016               Patient Name:  Carolyn Gaines MRN: [id number]  DOB: 1965/09/07 Age / Sex: 50 y.o., female   PCP: Carolyn Baars, MD              Medical Service: Internal Medicine Teaching Service              Attending Physician: Dr. Inez Catalina, MD    First Contact: Leanna Sato, MS 3 Pager: [phone]  Second Contact: Dr. Noemi Chapel, DO Pager: [phone]  Third Contact Dr. Orest Dikes, MD Pager: [phone]       After Hours (After 5p/  First Contact Pager: [phone]  weekends / holidays): Second Contact Pager: [phone]   Chief Complaint: Chest Pain  History of Present Illness: Carolyn Gaines is a 51 year old woman with a history of MI (acute inferior wall, 01/2016, RCA stent placed), CAD, Fibromyalgia, and depression with anxiety who presented today after acute onset, constant, burning, substernal, pressure-like chest pain which began after she lifted a heavy box at work about 1 hour before arrival in the ED at 2pm. She reports that the pain is 6-7/10 after receiving morphine and nitroglycerine in the ED, and it radiated to her left arm on onset although the radiation has resolved. She states that the pain feels like her previous MI and improved about 50% after she took her own nitroglycerine at work. 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 She reports associated SOB. She denies diaphoresis, nausea, vomiting, or abdominal pain.After her previous MI 9 months ago she takes aspirin, carvidelol, enoxaparin, rosuvastatin, and ticagrelor daily, and she reports compliance with these medications. She has multiple similar admissions to the ED for chest pain that "feels just like her heart attack" since her MI last July. In these instances the pain has also radiated to her left arm and improved with nitroglycerin, but no ECG changes have been found and troponin's have been negative.  She has been observed over night and treated for chronic chest pain syndrome with tramadol and out patient stress testing. She does have a significant history of coronary artery disease including successful angioplasty of RCA. And very large LAD disease with 60-70% stenosis proximal to mid segment diffuse disease.  She additionally reports a 2 week history of severe cough and mild SOB for which she was [facility] ago. She was diagnosed with bronchitis and given a course of ABX that she has completed with relief. She was also treated for a UTI at that time that she reports has resolved.   She presented to the ED today having taken 324 aspiring and 1 NTG prior to arrival which reduced her chest pain from 10 to 5. Here initial BP was 100/70 after 2nd dose of NTG, Bp-98/40 given . Her CXR was non-focal.   Meds: Current Facility-Administered Medications  Medication Dose Route Frequency Provider Last Rate Last Dose  . acetaminophen (TYLENOL) tablet 1,000 mg  1,000 mg Oral Q6H PRN Fuller Plan, MD      . albuterol (PROVENTIL) (2.5 MG/3ML) 0.083% nebulizer solution 3 mL  3 mL Inhalation Q4H PRN Fuller Plan, MD      . Melene Muller ON 10/21/2016] aspirin EC tablet 325 mg  325 mg Oral Daily Fuller Plan, MD      .  carvedilol (COREG) tablet 3.125 mg  3.125 mg Oral BID WC Fuller Plan, MD      . enoxaparin (LOVENOX) injection 75 mg  1 mg/kg Subcutaneous Q12H Fuller Plan, MD      . Melene Muller ON 10/21/2016] escitalopram (LEXAPRO) tablet 20 mg  20 mg Oral Daily Fuller Plan, MD      . morphine 4 MG/ML injection 2 mg  2 mg Intravenous Q2H PRN Fuller Plan, MD      . nitroGLYCERIN (NITROSTAT) SL tablet 0.4 mg  0.4 mg Sublingual Q5 Min x 3 PRN Fuller Plan, MD      . ondansetron [facility]) injection 4 mg  4 mg Intravenous Q6H PRN Fuller Plan, MD      . rosuvastatin (CRESTOR) tablet 20 mg  20 mg Oral q1800 Fuller Plan, MD      . ticagrelor  [facility]) tablet 90 mg  90 mg Oral BID Fuller Plan, MD       Current Outpatient Prescriptions  Medication Sig Dispense Refill  . acetaminophen (TYLENOL) 500 MG tablet Take 1,000 mg by mouth every 6 (six) hours as needed for headache.    . albuterol (PROVENTIL HFA;VENTOLIN HFA) 108 (90 Base) MCG/ACT inhaler Inhale 2 puffs into the lungs every 4 (four) hours as needed for wheezing or shortness of breath.    Marland Kitchen aspirin EC 81 MG EC tablet Take 1 tablet (81 mg total) by mouth daily. 30 tablet 11  . carvedilol (COREG) 3.125 MG tablet Take 1 tablet (3.125 mg total) by mouth 2 (two) times daily with a meal. 60 tablet 6  . escitalopram (LEXAPRO) 20 MG tablet Take 20 mg by mouth daily.  1  . mupirocin nasal ointment (BACTROBAN) 2 % Apply rash bid 1 g 1  . nitroGLYCERIN (NITROSTAT) 0.4 MG SL tablet Place 1 tablet (0.4 mg total) under the tongue every 5 (five) minutes x 3 doses as needed for chest pain. 25 tablet 1  . rosuvastatin (CRESTOR) 20 MG tablet Take 1 tablet (20 mg total) by mouth daily at 6 PM. 30 tablet 6  . ticagrelor (BRILINTA) 90 MG TABS tablet Take 1 tablet (90 mg total) by mouth 2 (two) times daily. 60 tablet 6  . ketorolac (TORADOL) 10 MG tablet Take 1 tablet (10 mg total) by mouth every 8 (eight) hours as needed for moderate pain. (Patient not taking: Reported on 10/12/2016) 20 tablet 0  . traMADol (ULTRAM) 50 MG tablet Take 2 tablets (100 mg total) by mouth every 6 (six) hours as needed for moderate pain or severe pain. (Patient not taking: Reported on 10/12/2016) 15 tablet 0    Allergies: Allergies as of 10/20/2016 - Review Complete 10/20/2016  Allergen Reaction Noted  . Bee venom Other (See Comments) 02/19/2016  . Adhesive [tape] Itching, Swelling, and Other (See Comments) 02/19/2016  . Triple antibiotic [bacitracin-neomycin-polymyxin] Rash 02/19/2016   Past Medical History:  Diagnosis Date  . Anxiety   . Arthritis   . Depression   . Fibromyalgia   . MI (myocardial  infarction) 01/2016   Past Surgical History:  Procedure Laterality Date  . ABDOMINAL HYSTERECTOMY    . CARDIAC CATHETERIZATION N/A 02/19/2016   Procedure: Left Heart Cath and Coronary Angiography;  Surgeon: Yates Decamp, MD;  Location: [facility] INVASIVE CV LAB;  Service: Cardiovascular;  Laterality: N/A;  . CARDIAC CATHETERIZATION N/A 02/19/2016   Procedure: Coronary Stent Intervention;  Surgeon: Yates Decamp, MD;  Location: [facility] INVASIVE CV LAB;  Service: Cardiovascular;  Laterality: N/A;  . cesarian     Family History  Problem Relation Age of Onset  . COPD Mother   . Arthritis Mother   . Heart disease Mother   . Psoriasis Father    Social History: Ms. Beebe is married, and lives with her husband who is a smoker on O2 for chronic COPD. She has a delightful sense of humor. She is a former cigarrette smoker, but she quit after her heart attack last July. She is a social drinker and does not use recreational drugs.   Review of Systems: General: she denies fever or chills Pulmonary: SOB and cough x 10 days Cardio: Chest pain, and palpitations GI: no pain, no nausea, no vomiting, no diarrhea GU: no dysuria, hematuria, itchiness, pain, or discharge Musculoskeletal: baseline joint pain secondary to fibromyalgia Skin: no diaphoresis   Physical Exam: Blood pressure 103/60, pulse (!) 58, temperature 97.9 F (36.6 C), temperature source Oral, resp. rate 16, height 5\' 4"  (1.626 m), weight 76.2 kg (168 lb), SpO2 100 %. General: obese, in no acute distress HEENT: PEERLA, oral pharynx clear, mucus membranes moist Pulmonary: chest wall tender to palpation, No wheezes, rales, or rhonchi. Normal work of breathing Cardio: RRR, no murmur Neuro: EOMI, 5/5 strength in all extremities Musculoskeletal: diffuse tenderness to palpation secondary to fibromyalgia Skin: warm and dry Psych: Anxious, answers questions appropriately   Lab results: Hemoglobin: 11 (mild normocytic anemia)  Troponin: <0.03 (negative, will  continue to trend to rule out N-STEMI)  D-Dimer: 1.67 (elevated, likely due to her female sex and increasing age, possibly also her fibromyalgai. Unlikely related to DVT or PE given her lack of hypoxia or significant dyspnea)   Imaging results:  Dg Chest 2 View  Result Date: 10/20/2016 CLINICAL DATA:  Sudden onset of chest pain starting today at work, history of MI EXAM: CHEST  2 VIEW COMPARISON:  10/12/2016 FINDINGS: Cardiomediastinal silhouette is stable. No infiltrate or pleural effusion. No pulmonary edema. Bony thorax is unremarkable. IMPRESSION: No active cardiopulmonary disease. Electronically Signed   By: Natasha Mead M.D.   On: 10/20/2016 14:50   Other results: EKG: sinus rhythm, rate 54, no ST wave elevation, pathologic Q waves in leads III and AVF, no significant changes from prior ECG  Cardiac Catheterization: 02/19/16: successful angioplasty of RCA. And very large LAD disease with 60-70% stenosis proximal to mid segment diffuse disease.  Assessment & Plan by Problem:  Principal Problem: Chest Pain Active Problems: Fibromyalgia, HTN, Depression with Anxiety   Ms. Rodwell is a 51 year old woman with a history of MI (acute inferior wall, 01/2016, RCA stent placed), CAD, Fibromyalgia, and depression with anxiety who presented today after acute onset, constant, burning, substernal, pressure-like chest pain which began after she lifted a heavy box at work about 1 hour before arrival in the ED.  Chest Pain: Ms. Pugmire presented with acute, constant, burning, substernal, pressure-like chest pain that began with exertion after living a heavy box 3-5 hours before admission. This pain did not improve with rest, but went from 10/10 to 5/10 with nitroglycerine. She rated her pain as a 6-7/10 after additional nitroglycerine and morphine and described it like an elephant sitting on her chest.  She reports associated SOB. She denies diaphoresis, nausea, vomiting, or abdominal pain.After her previous MI 9  months ago she takes aspirin, carvidelol, enoxaparin, rosuvastatin, and ticagrelor daily, and she reports compliance with these medications. She has multiple similar admissions to the ED for chest pain that "feels just like her  heart attack" since her MI last July. In these instances the pain has also radiated to her left arm and improved with nitroglycerin, but no ECG changes have been found and troponin's have been negative.  It is prudent to admit her for observation given her extensive cardiac history. She does have a significant history of coronary artery disease including successful angioplasty of RCA. And very large LAD disease with 60-70% stenosis proximal to mid segment diffuse disease.   On exam her pain was reproducible by palpation. She has diffuse chronic muscle pain that is tender to palpation on exam secondary to her fibromyalgia. In addition she has a recent history of severe cough and bronchitis treated at [facility] ED 10 days ago that could cause pleuritic chest pain. Her pulmonary exam was non-focal. It is likely that her chest pain is secondary to her bronchitis or fibromyalgia, at her previous admissions her pain has resolved with Toradol. We will monitor her over night in step down on telemetry and give her O2, morphine, and nitroglycerine for her chest pain and to reduce her cardiac oxygen demand. Her EKG showed pathologic Q waves but no ST elevations so she does not require immediate catheterization. Her first troponin was negative at <0.03, but she arrived soon after her pain began so we will continue to trend them to look out for NSTEMI, in the event of NSTEMI we will consult cardiology. We will continue her aspirin, carvidelol, enoxaparin, rosuvastatin, and ticagrelor to reduce cardiac oxygen demand and deleterious remodeling. We will order an echocardiogram to assess her cardiac function post prior MI. -O2 -morphine -nitroglycerine -register for telemetry, admit to stepdown for  obs -trend troponin -continue aspirin -continue carvidelol -continue enoxaparin -continue rosuvastatin -continue ticagrelor -echocardiogram  Fibromyalgia: Pt has a history of diffuse chronic muscle pain due to fibromyalgia. It is particularly severe around her shoulders. This pain could be contributing to her reproducible chest pain. She has morphine PRN for her chest pain which should also manage her fibromyalgia pain.  -morphine   Depression with anxiety: Pt has a chronic history of depression with anxiety. Her anxiety may elevate her concern about chest pain after her MI explaining her multiple admissions questionable ischemic pain. Will continue her Lexapro. -lexapro  Hypertension: Pt has a history of essential hypertension. Her BP has been low after nitroglycerine bottoming out at 98/56. Will continue to monitor. We will continue her carvidelol. carvedilol   Normocytic anemia: Her Hgb is 11.0, she has a chronic history of normocytic anemia and this value is stable relative. She has no hematuria or evidence for GI bleed. Will continue to monitor.  DVT prophylaxis: low molecular weight heparin Code Status: Full code, discussed with patient at bedside Disposition: Observe overnight for cardiac workup, likely discharge in less than 2 midnights.   This is a Psychologist, occupational Note.  The care of the patient was discussed with Dr. Vincente Liberty and the assessment and plan was formulated with their assistance.  Please see their note for official documentation of the patient encounter.   Signed: Harvie Junior, Medical Student 10/20/2016, 5:14 PM

## 2016-10-21 ENCOUNTER — Observation Stay (HOSPITAL_COMMUNITY): Payer: BLUE CROSS/BLUE SHIELD

## 2016-10-21 DIAGNOSIS — Z955 Presence of coronary angioplasty implant and graft: Secondary | ICD-10-CM | POA: Diagnosis not present

## 2016-10-21 DIAGNOSIS — I10 Essential (primary) hypertension: Secondary | ICD-10-CM | POA: Diagnosis not present

## 2016-10-21 DIAGNOSIS — J40 Bronchitis, not specified as acute or chronic: Secondary | ICD-10-CM | POA: Diagnosis present

## 2016-10-21 DIAGNOSIS — R0902 Hypoxemia: Secondary | ICD-10-CM | POA: Diagnosis not present

## 2016-10-21 DIAGNOSIS — Z4682 Encounter for fitting and adjustment of non-vascular catheter: Secondary | ICD-10-CM | POA: Diagnosis not present

## 2016-10-21 DIAGNOSIS — Z7982 Long term (current) use of aspirin: Secondary | ICD-10-CM | POA: Diagnosis not present

## 2016-10-21 DIAGNOSIS — E782 Mixed hyperlipidemia: Secondary | ICD-10-CM | POA: Diagnosis not present

## 2016-10-21 DIAGNOSIS — R079 Chest pain, unspecified: Secondary | ICD-10-CM | POA: Diagnosis not present

## 2016-10-21 DIAGNOSIS — M797 Fibromyalgia: Secondary | ICD-10-CM | POA: Diagnosis not present

## 2016-10-21 DIAGNOSIS — R072 Precordial pain: Secondary | ICD-10-CM

## 2016-10-21 DIAGNOSIS — J90 Pleural effusion, not elsewhere classified: Secondary | ICD-10-CM | POA: Diagnosis not present

## 2016-10-21 DIAGNOSIS — F418 Other specified anxiety disorders: Secondary | ICD-10-CM | POA: Diagnosis not present

## 2016-10-21 DIAGNOSIS — I2510 Atherosclerotic heart disease of native coronary artery without angina pectoris: Secondary | ICD-10-CM | POA: Diagnosis not present

## 2016-10-21 DIAGNOSIS — Z01810 Encounter for preprocedural cardiovascular examination: Secondary | ICD-10-CM | POA: Diagnosis not present

## 2016-10-21 DIAGNOSIS — J9811 Atelectasis: Secondary | ICD-10-CM | POA: Diagnosis not present

## 2016-10-21 DIAGNOSIS — I083 Combined rheumatic disorders of mitral, aortic and tricuspid valves: Secondary | ICD-10-CM | POA: Diagnosis not present

## 2016-10-21 DIAGNOSIS — J069 Acute upper respiratory infection, unspecified: Secondary | ICD-10-CM | POA: Diagnosis not present

## 2016-10-21 DIAGNOSIS — I252 Old myocardial infarction: Secondary | ICD-10-CM | POA: Diagnosis not present

## 2016-10-21 DIAGNOSIS — I25110 Atherosclerotic heart disease of native coronary artery with unstable angina pectoris: Secondary | ICD-10-CM | POA: Diagnosis not present

## 2016-10-21 DIAGNOSIS — Z79899 Other long term (current) drug therapy: Secondary | ICD-10-CM | POA: Diagnosis not present

## 2016-10-21 DIAGNOSIS — Z7901 Long term (current) use of anticoagulants: Secondary | ICD-10-CM | POA: Diagnosis not present

## 2016-10-21 DIAGNOSIS — E785 Hyperlipidemia, unspecified: Secondary | ICD-10-CM | POA: Diagnosis not present

## 2016-10-21 DIAGNOSIS — E8770 Fluid overload, unspecified: Secondary | ICD-10-CM | POA: Diagnosis not present

## 2016-10-21 DIAGNOSIS — Z87891 Personal history of nicotine dependence: Secondary | ICD-10-CM | POA: Diagnosis not present

## 2016-10-21 DIAGNOSIS — I4891 Unspecified atrial fibrillation: Secondary | ICD-10-CM | POA: Diagnosis not present

## 2016-10-21 DIAGNOSIS — R791 Abnormal coagulation profile: Secondary | ICD-10-CM | POA: Diagnosis not present

## 2016-10-21 DIAGNOSIS — R0789 Other chest pain: Secondary | ICD-10-CM | POA: Diagnosis not present

## 2016-10-21 DIAGNOSIS — R05 Cough: Secondary | ICD-10-CM | POA: Diagnosis not present

## 2016-10-21 DIAGNOSIS — R0602 Shortness of breath: Secondary | ICD-10-CM | POA: Diagnosis not present

## 2016-10-21 DIAGNOSIS — D62 Acute posthemorrhagic anemia: Secondary | ICD-10-CM | POA: Diagnosis not present

## 2016-10-21 DIAGNOSIS — I200 Unstable angina: Secondary | ICD-10-CM | POA: Diagnosis not present

## 2016-10-21 LAB — ECHOCARDIOGRAM COMPLETE
Height: 64 in
Weight: 2788.8 oz

## 2016-10-21 LAB — IRON AND TIBC
Iron: 54 ug/dL (ref 28–170)
SATURATION RATIOS: 25 % (ref 10.4–31.8)
TIBC: 220 ug/dL — AB (ref 250–450)
UIBC: 166 ug/dL

## 2016-10-21 LAB — TROPONIN I

## 2016-10-21 LAB — FERRITIN: Ferritin: 38 ng/mL (ref 11–307)

## 2016-10-21 LAB — HIV ANTIBODY (ROUTINE TESTING W REFLEX): HIV Screen 4th Generation wRfx: NONREACTIVE

## 2016-10-21 MED ORDER — SODIUM CHLORIDE 0.9% FLUSH
3.0000 mL | INTRAVENOUS | Status: DC | PRN
  Administered 2016-10-21: 3 mL via INTRAVENOUS
  Filled 2016-10-21: qty 3

## 2016-10-21 MED ORDER — SODIUM CHLORIDE 0.9 % IV SOLN: 250.0000 mL | INTRAVENOUS | Status: DC | PRN

## 2016-10-21 MED ORDER — SODIUM CHLORIDE 0.9 % WEIGHT BASED INFUSION
3.0000 mL/kg/h | INTRAVENOUS | Status: DC
Start: 2016-10-22 — End: 2016-10-22

## 2016-10-21 MED ORDER — SODIUM CHLORIDE 0.9% FLUSH
3.0000 mL | Freq: Two times a day (BID) | INTRAVENOUS | Status: DC
  Administered 2016-10-21 – 2016-10-22 (×2): 3 mL via INTRAVENOUS

## 2016-10-21 MED ORDER — ENOXAPARIN SODIUM 40 MG/0.4ML ~~LOC~~ SOLN: 40.0000 mg | SUBCUTANEOUS | Status: DC

## 2016-10-21 MED ORDER — ASPIRIN EC 81 MG PO TBEC
81.0000 mg | DELAYED_RELEASE_TABLET | Freq: Every day | ORAL | Status: DC
  Administered 2016-10-21 – 2016-10-25 (×4): 81 mg via ORAL
  Filled 2016-10-21 (×5): qty 1

## 2016-10-21 MED ORDER — ASPIRIN 81 MG PO CHEW
81.0000 mg | CHEWABLE_TABLET | ORAL | Status: AC
  Administered 2016-10-22: 81 mg via ORAL
  Filled 2016-10-21: qty 1

## 2016-10-21 MED ORDER — SODIUM CHLORIDE 0.9 % WEIGHT BASED INFUSION
1.0000 mL/kg/h | INTRAVENOUS | Status: DC
  Administered 2016-10-22: 1 mL via INTRAVENOUS
  Administered 2016-10-22: 1 mL/kg/h via INTRAVENOUS

## 2016-10-21 NOTE — Consult Note (Signed)
CARDIOLOGY CONSULT NOTE   Patient ID: Carolyn Gaines MRN: [id number] DOB/AGE: 03-21-1966 51 y.o.  Admit date: 10/20/2016  Primary Physician   Fredirick Maudlin, MD Primary Cardiologist  Will Establish Care with Dr. Wyline Mood 10/2016 (previous patient of Dr. Jacinto Halim) Reason for Consultation  Chest pain  Requesting Physician  Dr. Criselda Peaches  HPI: Carolyn Gaines is a 51 y.o. female with a history of CAD s/p DES to RCA, anxiety, depression, fibromyalgia, osteoarthritis and recurrent chest pain presents for chest pressure after lifting heavy box at work.   Hx of acute inferior STEMI on 02/19/2016 for which underwent emergent PCI/DES RCA. She has residual 60-70% mid LAD stenosis. Readmitted 07/03/16 and 07/19/16 with recurrent chest pain felt to be atypical. Symptoms were reproducible, normal EKG and negative cardiac markers.  2-D echo 03/21/16 normal LVEF 55% trace of MR. Nuclear stress test 04/16/16 small basal septal scar with very mild peri-infarct ischemia, small to medium size anterior apical and apical scar with no significant peri-infarct ischemia, LVEF moderately depressed at 40% with global hypokinesis. Intermediate risk study.  Seen by [name] 09/19/16 to establish cardiac care in [place] as patient lives there. Chronic chest pain since MI. She is under a lot of stress and suffering from anxiety/depression. Unable to titrate meds due to soft BP. Plan discussed with Dr. Wyline Mood and follow up made with him in 10/2016.  She was treated with abx 2 weeks ago for bronchitis. Still recovering. Intermittent cough. She has chronic upper chest pain due to fibromyalgia. She was lifting heavy box at work and developed lower sternal chest pressure 10/10  that radiated to L shoulder. Similar to prior MI and different from typical fibromyalgia pain. Pain did not improved with rest. Minimally improved pressure after SL nitro x 1. BP was very high and EMS called. Given ASA 324 and another SL nitro x 1. Subsequent  drop in BP requiring bolus of 1 L fluid. She is admitted for further evaluation.   D-dimer 1.6. Low threshold for PE and did not pursued PE workup per primary note. Troponin negative x 4. EKG showed NSR without acute changes. CXR without acute abnormality.   She still have lowe sternal chest pressure 5/10. Her pain/pressure reproducible with palpation. She is unable to tell me that this her fibromyalgia pain or pain similar to prior MI during palpation. Compliant with home medication. She need answers.   Past Medical History:  Diagnosis Date  . Anxiety   . Arthritis   . Depression   . Fibromyalgia   . MI (myocardial infarction) 01/2016     Past Surgical History:  Procedure Laterality Date  . ABDOMINAL HYSTERECTOMY    . CARDIAC CATHETERIZATION N/A 02/19/2016   Procedure: Left Heart Cath and Coronary Angiography;  Surgeon: Yates Decamp, MD;  Location: [facility] INVASIVE CV LAB;  Service: Cardiovascular;  Laterality: N/A;  . CARDIAC CATHETERIZATION N/A 02/19/2016   Procedure: Coronary Stent Intervention;  Surgeon: Yates Decamp, MD;  Location: [facility] INVASIVE CV LAB;  Service: Cardiovascular;  Laterality: N/A;  . cesarian      Allergies  Allergen Reactions  . Bee Venom Other (See Comments)    Pus sites at injection  . Adhesive [Tape] Itching, Swelling and Other (See Comments)    Please use paper tape  . Triple Antibiotic [Bacitracin-Neomycin-Polymyxin] Rash    I have reviewed the patient's current medications . aspirin EC  81 mg Oral Daily  . carvedilol  3.125 mg Oral BID WC  . diclofenac sodium  2  g Topical QID  . enoxaparin (LOVENOX) injection  1 mg/kg Subcutaneous Q12H  . escitalopram  20 mg Oral Daily  . rosuvastatin  20 mg Oral q1800  . ticagrelor  90 mg Oral BID    acetaminophen, albuterol, ketorolac, morphine injection, nitroGLYCERIN, ondansetron (ZOFRAN) IV  Prior to Admission medications   Medication Sig Start Date End Date Taking? Authorizing Provider  acetaminophen (TYLENOL) 500  MG tablet Take 1,000 mg by mouth every 6 (six) hours as needed for headache.   Yes Historical Provider, MD  albuterol (PROVENTIL HFA;VENTOLIN HFA) 108 (90 Base) MCG/ACT inhaler Inhale 2 puffs into the lungs every 4 (four) hours as needed for wheezing or shortness of breath.   Yes Historical Provider, MD  aspirin EC 81 MG EC tablet Take 1 tablet (81 mg total) by mouth daily. 02/20/16  Yes Marcy Salvo, NP  carvedilol (COREG) 3.125 MG tablet Take 1 tablet (3.125 mg total) by mouth 2 (two) times daily with a meal. 09/19/16  Yes Dyann Kief, PA-C  escitalopram (LEXAPRO) 20 MG tablet Take 20 mg by mouth daily. 03/31/16  Yes Historical Provider, MD  mupirocin nasal ointment (BACTROBAN) 2 % Apply rash bid 10/12/16  Yes Ivery Quale, PA-C  nitroGLYCERIN (NITROSTAT) 0.4 MG SL tablet Place 1 tablet (0.4 mg total) under the tongue every 5 (five) minutes x 3 doses as needed for chest pain. 02/20/16  Yes Marcy Salvo, NP  rosuvastatin (CRESTOR) 20 MG tablet Take 1 tablet (20 mg total) by mouth daily at 6 PM. 09/19/16  Yes Dyann Kief, PA-C  ticagrelor (BRILINTA) 90 MG TABS tablet Take 1 tablet (90 mg total) by mouth 2 (two) times daily. 09/19/16  Yes Dyann Kief, PA-C  ketorolac (TORADOL) 10 MG tablet Take 1 tablet (10 mg total) by mouth every 8 (eight) hours as needed for moderate pain. Patient not taking: Reported on 10/12/2016 07/19/16   Kathlen Mody, MD  traMADol (ULTRAM) 50 MG tablet Take 2 tablets (100 mg total) by mouth every 6 (six) hours as needed for moderate pain or severe pain. Patient not taking: Reported on 10/12/2016 07/04/16   Joseph Art, DO     Social History   Social History  . Marital status: Married    Spouse name: N/A  . Number of children: N/A  . Years of education: N/A   Occupational History  . Not on file.   Social History Main Topics  . Smoking status: Former Smoker    Quit date: 03/21/2011  . Smokeless tobacco: Never Used  . Alcohol use No  . Drug use: No    . Sexual activity: Not on file   Other Topics Concern  . Not on file   Social History Narrative  . No narrative on file    Family Status  Relation Status  . Mother Alive  . Father Alive   Family History  Problem Relation Age of Onset  . COPD Mother   . Arthritis Mother   . Heart disease Mother   . Psoriasis Father      ROS:  Full 14 point review of systems complete and found to be negative unless listed above.  Physical Exam: Blood pressure 101/63, pulse (!) 59, temperature 97.9 F (36.6 C), temperature source Oral, resp. rate 12, height 5\' 4"  (1.626 m), weight 174 lb 4.8 oz (79.1 kg), SpO2 98 %.  General: Well developed, well nourished, female in no acute distress Head: Eyes PERRLA, No xanthomas. Normocephalic and atraumatic, oropharynx without edema or  exudate.  Lungs: Resp regular and unlabored, CTA. CP reproducible with palpation.  Heart: RRR no s3, s4, or murmurs..   Neck: No carotid bruits. No lymphadenopathy.  No JVD. Abdomen: Bowel sounds present, abdomen soft and non-tender without masses or hernias noted. Msk:  No spine or cva tenderness. No weakness, no joint deformities or effusions. Extremities: No clubbing, cyanosis or edema. DP/PT/Radials 2+ and equal bilaterally. Neuro: Alert and oriented X 3. No focal deficits noted. Psych:  Good affect, responds appropriately Skin: No rashes or lesions noted.  Labs:   Lab Results  Component Value Date   WBC 9.0 10/20/2016   HGB 11.0 (L) 10/20/2016   HCT 34.0 (L) 10/20/2016   MCV 89.2 10/20/2016   PLT 269 10/20/2016    Recent Labs  10/20/16 1505  INR 1.04    Recent Labs Lab 10/20/16 1400  NA 139  K 3.6  CL 109  CO2 24  BUN 16  CREATININE 0.82  CALCIUM 8.2*  GLUCOSE 79   No results found for: MG  Recent Labs  10/20/16 1728 10/20/16 2024 10/20/16 2256 10/21/16 0142  TROPONINI <0.03 <0.03 <0.03 <0.03    Recent Labs  10/20/16 1403  TROPIPOC 0.00   No results found for: PROBNP Lab  Results  Component Value Date   CHOL 162 02/19/2016   HDL 33 (L) 02/19/2016   LDLCALC 115 (H) 02/19/2016   TRIG 71 02/19/2016   Lab Results  Component Value Date   DDIMER 1.67 (H) 10/20/2016   No results found for: LIPASE, AMYLASE TSH  Date/Time Value Ref Range Status  02/20/2016 03:12 AM 0.905 0.350 - 4.500 uIU/mL Final   Ferritin  Date/Time Value Ref Range Status  10/20/2016 10:56 PM 38 11 - 307 ng/mL Final   TIBC  Date/Time Value Ref Range Status  10/20/2016 10:56 PM 220 (L) 250 - 450 ug/dL Final   Iron  Date/Time Value Ref Range Status  10/20/2016 10:56 PM 54 28 - 170 ug/dL Final    Cath 1/61/09 Procedures   Coronary Stent Intervention  Left Heart Cath and Coronary Angiography  Conclusion   Coronary angiogram 02/19/2016:  Mildly elevated LVEDP.   Mid RCA 99% stenosed S/P 3.0 x 30 mm resolute DES reduced to 0%, TIMI-3 to TIMI-3 flow.   Left main ostial 30-40%, mid LAD diffuse long segment disease of 20-30% followed by a focal 60-70% stenosis. Small ramus intermediate and circumflex without significant disease.  Recommendation: Patient had successful angioplasty to right coronary artery. Has a very large LAD with diffusely diseased proximal to mid segment diffuse disease and midsegment has a 60-70% stenosis which appears to be hazy at the third septal perforator which also appears to have a ulcerated lesion. She will need outpatient stress testing to evaluate the significance of the LAD stenosis. Left main stenosis does not appear to be significant and needs aggressive risk factor modification including statin therapy. Patient is motivated for smoking cessation. Diagnostic Diagram       Post-Intervention Diagram           Outpatient Echocardiogram 03/21/2016 Left ventricle cavity is normal in size. Normal global wall motion. Normal diastolic filling pattern. Calculated EF 55%. Left atrial cavity is normal in size. Interatrial septal dropout without obvious  PFO or ASD. Trace mitral regurgitation. Trace tricuspid regurgitation. Unable to estimate PA pressure due to absence/minimal TR signal. Essentially normal echocardiogram  Radiology:  Dg Chest 2 View  Result Date: 10/20/2016 CLINICAL DATA:  Sudden onset of chest pain starting today  at work, history of MI EXAM: CHEST  2 VIEW COMPARISON:  10/12/2016 FINDINGS: Cardiomediastinal silhouette is stable. No infiltrate or pleural effusion. No pulmonary edema. Bony thorax is unremarkable. IMPRESSION: No active cardiopulmonary disease. Electronically Signed   By: Natasha Mead M.D.   On: 10/20/2016 14:50    ASSESSMENT AND PLAN:     1. Chest pain - Both typical and atypical chest pain. Her pain/pressure reproducible with palpation. Hard to diffrentiate acute vs chronic pain during palpation. However, chest pressure started after lifting heavy box yesterday that is similar to prior MI. Fibromyalgia pain mostly at upper chest.  - Troponin x 4 negative. EKG without acute finding. D-dimer elevated however low threshold for PE per primary.  - BP is soft. Limiting aggressive medical management. Mild peri-infarct ischemia on stress test in 03/2016. She does have diffuse diease on cath. Pending echo this admission. - Will review plan with MD.  - Continue ASA, Brilinta, BB and statin.   2. CAD s/p DES to RCA - She has residual 60-70% diffuse long LAD treated medically.    SignedManson Passey, PA 10/21/2016, 8:54 AM Pager [phone]  Co-Sign MD Patient seen and discussed with PA,  I agree with documentation. 51 yo female history of inferior STEMI 01/2016, received DES to RCA. Residual LAD disease 60-70% managed medically. Admit 07/03/2016 with atypical chest pain, ruled out and discharged home. Repeat admit later 06/2016 with chest pain atypical and ruled out. Presented yesterday with recurernt chest pain.    This episode of chest pain occurred while lifting boxes at work. 10/10 in severity, down to 5/10 with  NG. Reports several week history of cough, soreness with coughing.   K 3.6 Cr 0.82 Hgb 11 Plt 269 Trop neg x 3 EKG SR without ischemic changes Dr Jacinto Halim notes: 04/16/16 Exercise nuclear stress: 4 minutes, small septal scar with mild peri-infarct ischemia, small to medium sized anteroapical and apical scar without ischemia. LVEF 40%.   Patient with multiple admissions with chest pain since her MI in 01/2016. Symptoms have primarily been atypical though some typical features, her chest wall is tender to palpation as previously described. From Dr Verl Dicker notes nuclear stress 03/2016 (after stent) with mild septal ischemia, intermediate risk. From prior cath she had a borderline LAD lesion 60-70%. Though symptoms are atypical, concern remains about possible ischemia from her moderate LAD lesion or perhaps issues with prior stent. Plan is for repeat cath to definitevely evaluate for ongoing CAD, evaluate RCA and possible FFR of LAD.  Dina Rich MD

## 2016-10-21 NOTE — Progress Notes (Signed)
   Subjective: Carolyn Gaines was seen and evaluated today at bedside. She still continues to complain of fatigue, diffuse body pain, chest pain + pressure. She describes her chest pain as burning and pressure-like. Also reports her diffuse fibromyalgia pain feels like burning and pressure. Topical voltaren gel seems to help her pain a little. Morphine helps.  Objective:  Vital signs in last 24 hours: Vitals:   10/20/16 2036 10/20/16 2120 10/21/16 0500 10/21/16 0749  BP: 95/60 (!) 103/53 (!) 107/98 101/63  Pulse: 65  64 (!) 59  Resp: 16  16 12   Temp: 98.1 F (36.7 C)  97.4 F (36.3 C) 97.9 F (36.6 C)  TempSrc: Oral  Oral Oral  SpO2: 97%  98% 98%  Weight:   174 lb 4.8 oz (79.1 kg)   Height:       General: In no acute distress. Resting comfortably in bed. Fatigued. HENT: [place]/AT. No conjunctival injection, icterus or ptosis Cardiovascular: Regular rate and rhythm. No murmur or rub appreciated. TTP chest wall.  Pulmonary: CTA BL, no wheezing. Unlabored breathing.  Abdomen: Soft and non-distended. No guarding or rigidity. +bowel sounds.  Extremities: No peripheral edema noted BL. Diffuse MSK tenderness throughout.  Psych: Mood low and affect was mood congruent. Responds to questions appropriately.   04/16/16 Exercise nuclear stress: 4 minutes, small septal scar with mild peri-infarct ischemia, small to medium sized anteroapical and apical scar without ischemia. LVEF 40%.   Assessment/Plan:This is a 51 y/o F with known CAD (s/p DES RCA 01/2016) and prior MI who presents with features of typical and atypical chest pain following lifting a heavy box at work. Troponins negative and EKG without acute changes. Patient has fibromyalgia confounding the presentation.   Principal Problem:   Chest pain Active Problems:   History of acute inferior wall MI   CAD (coronary artery disease), native coronary artery   Depression with anxiety   Essential hypertension   Fibromyalgia  Chest Pain:  Combination of both typical and atypical chest pain. Troponins negative x 5 and AM EKG unchanged. Pt has chronic pain from fibromyalgia and is currently in flare, confounding her presentation.  Cardiology on board who agree with evaluation with ECHO however will perform repeat cath as stress test after stent placement showing mild septal ischemia and was intermediate risk. In addition pt has multivessel disease as demonstrated on cath 01/2016 and stent placement.  -ASA, Brilinta, coreg 3.125 mg and crestor 20 mg -BP soft limiting aggressive medical management  -Troponins negative -Morphine PRN -Repeat cath (?tomorrow)  Hypertension, HLD: BP since admission soft. Coreg and crestor as above  Elevated D-Dimer: With low pre-test probability, modified geneva score of 0 and wells score of 0. Doubt PE as pt not hypoxic, tachycardic, tachypnic nor did she have recent surgery, or prolonged travel.  -Consider CTA if pt were to develop hypoxia, tachypnea or tachycardia.   Fibromyalgia: Appears to be in flare per patient. She endorses fatigue and diffuse body pain described as burning and pressure-like throughout. Has been on many different meds per patient however havent found many that help.  -Voltaren gel QID -Toradol PRN -K-pad prn  Depression and Anxiety: Continue Lexapro 20 mg daily.   Dispo: Anticipated discharge in approximately 2 day(s).   Margot Oriordan, DO 10/21/2016, 10:46 AM Pager: [phone]

## 2016-10-22 ENCOUNTER — Other Ambulatory Visit: Payer: Self-pay | Admitting: *Deleted

## 2016-10-22 ENCOUNTER — Encounter (HOSPITAL_COMMUNITY)
Admission: EM | Disposition: A | Discharge status: Home / Self Care | Payer: Self-pay | Source: Home / Self Care | Attending: Surgery

## 2016-10-22 DIAGNOSIS — R072 Precordial pain: Secondary | ICD-10-CM

## 2016-10-22 DIAGNOSIS — I25110 Atherosclerotic heart disease of native coronary artery with unstable angina pectoris: Secondary | ICD-10-CM

## 2016-10-22 DIAGNOSIS — I200 Unstable angina: Secondary | ICD-10-CM | POA: Diagnosis present

## 2016-10-22 DIAGNOSIS — I2510 Atherosclerotic heart disease of native coronary artery without angina pectoris: Secondary | ICD-10-CM

## 2016-10-22 HISTORY — PX: LEFT HEART CATH AND CORONARY ANGIOGRAPHY: CATH118249

## 2016-10-22 LAB — BASIC METABOLIC PANEL
ANION GAP: 7 (ref 5–15)
BUN: 19 mg/dL (ref 6–20)
CALCIUM: 8.2 mg/dL — AB (ref 8.9–10.3)
CO2: 23 mmol/L (ref 22–32)
CREATININE: 0.87 mg/dL (ref 0.44–1.00)
Chloride: 109 mmol/L (ref 101–111)
Glucose, Bld: 85 mg/dL (ref 65–99)
Potassium: 3.9 mmol/L (ref 3.5–5.1)
Sodium: 139 mmol/L (ref 135–145)

## 2016-10-22 LAB — MRSA PCR SCREENING: MRSA by PCR: NEGATIVE

## 2016-10-22 SURGERY — LEFT HEART CATH AND CORONARY ANGIOGRAPHY
Anesthesia: LOCAL

## 2016-10-22 MED ORDER — DIAZEPAM 5 MG PO TABS
5.0000 mg | ORAL_TABLET | Freq: Four times a day (QID) | ORAL | Status: DC | PRN
  Administered 2016-10-24 – 2016-10-26 (×3): 5 mg via ORAL
  Filled 2016-10-22 (×3): qty 1

## 2016-10-22 MED ORDER — OXYMETAZOLINE HCL 0.05 % NA SOLN
2.0000 | Freq: Two times a day (BID) | NASAL | Status: DC
  Administered 2016-10-23 – 2016-10-25 (×4): 2 via NASAL
  Filled 2016-10-22 (×4): qty 15

## 2016-10-22 MED ORDER — NITROGLYCERIN IN D5W 200-5 MCG/ML-% IV SOLN
INTRAVENOUS | Status: DC | PRN
  Administered 2016-10-22: 10 ug/min via INTRAVENOUS

## 2016-10-22 MED ORDER — HEPARIN (PORCINE) IN NACL 2-0.9 UNIT/ML-% IJ SOLN
INTRAMUSCULAR | Status: DC | PRN
  Administered 2016-10-22: 1000 mL

## 2016-10-22 MED ORDER — SODIUM CHLORIDE 0.9 % IV SOLN
INTRAVENOUS | Status: DC
  Administered 2016-10-22: 150 mL/h via INTRAVENOUS

## 2016-10-22 MED ORDER — MIDAZOLAM HCL 2 MG/2ML IJ SOLN
INTRAMUSCULAR | Status: AC
  Filled 2016-10-22: qty 2

## 2016-10-22 MED ORDER — KETOROLAC TROMETHAMINE 30 MG/ML IJ SOLN: 30.0000 mg | Freq: Three times a day (TID) | INTRAMUSCULAR | Status: DC | PRN

## 2016-10-22 MED ORDER — SODIUM CHLORIDE 0.9% FLUSH: 3.0000 mL | INTRAVENOUS | Status: DC | PRN

## 2016-10-22 MED ORDER — NITROGLYCERIN IN D5W 200-5 MCG/ML-% IV SOLN: 0.0000 ug/min | INTRAVENOUS | Status: DC

## 2016-10-22 MED ORDER — HEPARIN SODIUM (PORCINE) 1000 UNIT/ML IJ SOLN
INTRAMUSCULAR | Status: DC | PRN
  Administered 2016-10-22: 4000 [IU] via INTRAVENOUS

## 2016-10-22 MED ORDER — NITROGLYCERIN 1 MG/10 ML FOR IR/CATH LAB
INTRA_ARTERIAL | Status: AC
  Filled 2016-10-22: qty 10

## 2016-10-22 MED ORDER — GUAIFENESIN-DM 100-10 MG/5ML PO SYRP: 5.0000 mL | ORAL_SOLUTION | ORAL | Status: DC | PRN

## 2016-10-22 MED ORDER — HEPARIN SODIUM (PORCINE) 1000 UNIT/ML IJ SOLN
INTRAMUSCULAR | Status: AC
  Filled 2016-10-22: qty 1

## 2016-10-22 MED ORDER — NITROGLYCERIN 1 MG/10 ML FOR IR/CATH LAB
INTRA_ARTERIAL | Status: DC | PRN
  Administered 2016-10-22: 200 ug via INTRACORONARY

## 2016-10-22 MED ORDER — SODIUM CHLORIDE 0.9 % IV SOLN: 250.0000 mL | INTRAVENOUS | Status: DC | PRN

## 2016-10-22 MED ORDER — NITROGLYCERIN IN D5W 200-5 MCG/ML-% IV SOLN
INTRAVENOUS | Status: AC
Start: 2016-10-22 — End: 2016-10-22
  Filled 2016-10-22: qty 250

## 2016-10-22 MED ORDER — VERAPAMIL HCL 2.5 MG/ML IV SOLN
INTRAVENOUS | Status: DC | PRN
  Administered 2016-10-22: 10 mL via INTRA_ARTERIAL

## 2016-10-22 MED ORDER — LIDOCAINE HCL (PF) 1 % IJ SOLN
INTRAMUSCULAR | Status: AC
  Filled 2016-10-22: qty 30

## 2016-10-22 MED ORDER — VERAPAMIL HCL 2.5 MG/ML IV SOLN
INTRAVENOUS | Status: AC
  Filled 2016-10-22: qty 2

## 2016-10-22 MED ORDER — ACETAMINOPHEN 325 MG PO TABS
650.0000 mg | ORAL_TABLET | ORAL | Status: DC | PRN
  Administered 2016-10-22 – 2016-10-23 (×3): 650 mg via ORAL
  Filled 2016-10-22 (×2): qty 2

## 2016-10-22 MED ORDER — MIDAZOLAM HCL 2 MG/2ML IJ SOLN
INTRAMUSCULAR | Status: DC | PRN
  Administered 2016-10-22: 1 mg via INTRAVENOUS
  Administered 2016-10-22: 2 mg via INTRAVENOUS

## 2016-10-22 MED ORDER — IOPAMIDOL (ISOVUE-370) INJECTION 76%
INTRAVENOUS | Status: AC
  Filled 2016-10-22: qty 100

## 2016-10-22 MED ORDER — ROSUVASTATIN CALCIUM 10 MG PO TABS
40.0000 mg | ORAL_TABLET | Freq: Every day | ORAL | Status: DC
  Administered 2016-10-22 – 2016-10-30 (×8): 40 mg via ORAL
  Filled 2016-10-22: qty 2
  Filled 2016-10-22: qty 4
  Filled 2016-10-22: qty 2
  Filled 2016-10-22: qty 4
  Filled 2016-10-22 (×3): qty 2
  Filled 2016-10-22: qty 4
  Filled 2016-10-22: qty 1

## 2016-10-22 MED ORDER — FENTANYL CITRATE (PF) 100 MCG/2ML IJ SOLN
INTRAMUSCULAR | Status: DC | PRN
Start: 2016-10-22 — End: 2016-10-22
  Administered 2016-10-22 (×2): 25 ug via INTRAVENOUS

## 2016-10-22 MED ORDER — FENTANYL CITRATE (PF) 100 MCG/2ML IJ SOLN
INTRAMUSCULAR | Status: AC
  Filled 2016-10-22: qty 2

## 2016-10-22 MED ORDER — DM-GUAIFENESIN ER 30-600 MG PO TB12
1.0000 | ORAL_TABLET | Freq: Two times a day (BID) | ORAL | Status: DC
  Administered 2016-10-22 – 2016-10-25 (×8): 1 via ORAL
  Filled 2016-10-22 (×8): qty 1

## 2016-10-22 MED ORDER — LIDOCAINE HCL (PF) 1 % IJ SOLN
INTRAMUSCULAR | Status: DC | PRN
  Administered 2016-10-22: 2 mL

## 2016-10-22 MED ORDER — FLUTICASONE PROPIONATE 50 MCG/ACT NA SUSP
2.0000 | Freq: Every day | NASAL | Status: DC
  Administered 2016-10-22 – 2016-10-25 (×4): 2 via NASAL
  Filled 2016-10-22 (×3): qty 16

## 2016-10-22 MED ORDER — IOPAMIDOL (ISOVUE-370) INJECTION 76%
INTRAVENOUS | Status: DC | PRN
  Administered 2016-10-22: 90 mL via INTRAVENOUS

## 2016-10-22 MED ORDER — ACETAMINOPHEN 325 MG PO TABS
ORAL_TABLET | ORAL | Status: AC
  Filled 2016-10-22: qty 2

## 2016-10-22 MED ORDER — ONDANSETRON HCL 4 MG/2ML IJ SOLN: 4.0000 mg | Freq: Four times a day (QID) | INTRAMUSCULAR | Status: DC | PRN

## 2016-10-22 MED ORDER — CETIRIZINE HCL 5 MG/5ML PO SYRP
10.0000 mg | ORAL_SOLUTION | Freq: Every day | ORAL | Status: DC
  Administered 2016-10-22 – 2016-10-25 (×4): 10 mg via ORAL
  Filled 2016-10-22 (×5): qty 10

## 2016-10-22 MED ORDER — ATORVASTATIN CALCIUM 80 MG PO TABS: 80.0000 mg | ORAL_TABLET | Freq: Every day | ORAL | Status: DC

## 2016-10-22 MED ORDER — HEPARIN (PORCINE) IN NACL 100-0.45 UNIT/ML-% IJ SOLN
1100.0000 [IU]/h | INTRAMUSCULAR | Status: DC
  Administered 2016-10-22 – 2016-10-23 (×2): 1000 [IU]/h via INTRAVENOUS
  Administered 2016-10-25 – 2016-10-26 (×2): 1100 [IU]/h via INTRAVENOUS
  Filled 2016-10-22 (×5): qty 250

## 2016-10-22 MED ORDER — HEPARIN (PORCINE) IN NACL 2-0.9 UNIT/ML-% IJ SOLN
INTRAMUSCULAR | Status: AC
Start: 2016-10-22 — End: 2016-10-22
  Filled 2016-10-22: qty 1000

## 2016-10-22 SURGICAL SUPPLY — 12 items
CATH INFINITI 5FR ANG PIGTAIL (CATHETERS) ×2 IMPLANT
CATH OPTITORQUE TIG 4.0 5F (CATHETERS) ×1 IMPLANT
DEVICE RAD COMP TR BAND LRG (VASCULAR PRODUCTS) ×1 IMPLANT
ELECT DEFIB PAD ADLT CADENCE (PAD) ×1 IMPLANT
GLIDESHEATH SLEND SS 6F .021 (SHEATH) ×1 IMPLANT
GUIDEWIRE INQWIRE 1.5J.035X260 (WIRE) IMPLANT
INQWIRE 1.5J .035X260CM (WIRE) ×2
KIT HEART LEFT (KITS) ×2 IMPLANT
PACK CARDIAC CATHETERIZATION (CUSTOM PROCEDURE TRAY) ×2 IMPLANT
SYR MEDRAD MARK V 150ML (SYRINGE) ×2 IMPLANT
TRANSDUCER W/STOPCOCK (MISCELLANEOUS) ×2 IMPLANT
TUBING CIL FLEX 10 FLL-RA (TUBING) ×2 IMPLANT

## 2016-10-22 NOTE — Progress Notes (Signed)
  Date: 10/22/2016  Patient name: Carolyn Gaines  Medical record number: [id number]  Date of birth: Mar 25, 1966   This patient's plan of care was discussed with the house staff. Please see their note for complete details. I concur with their findings.  Patient resting relatively comfortably though c/o being cold. She is still coughing and we discussed interventions to help with her congestion and cough. Her fibromylagia appears to be bothering her and she is also very anxious about whether her CAD has changed. Her lungs were clear, no wrr, CV rrr, no mgr,   She is scheduled for cardiac cath today. Greatly appreciate Cardiologys help  FM: continue her voltaren gel,. I wonder if she has had eval for OSA or not  URI: recommended use local agent such as oxymetolazone spray followed by fluticasone prior to sleep     Randall Hiss, MD 10/22/2016, 11:59 AM

## 2016-10-22 NOTE — Progress Notes (Signed)
Patient ID: Carolyn Gaines, female   DOB: 1966-07-06, 51 y.o.   MRN: [id number] .      [address]       [place] [address]             [phone]        Carolyn Gaines [facility] Medical Record #[id number] Date of Birth: 28-Nov-1965  Referring: Dr Tresa Endo Primary Care: Fredirick Maudlin, MD  Chief Complaint:    Chief Complaint  Patient presents with  . Chest Pain    History of Present Illness:   Called to cath lab for emergency consult    Patient is 50yo woman with PMH of NSTEMI in 01/2016, HTN, previous tobacco use and fibromyalgia who presents with chest pain which started after lifting heavy boxes at work and was admitted two days ago . Cardiac enzymes negative She had stent placed in th rca  In 2017.    She has been taking her brilinta including just before cath and does have multiple bruises which she relates to this medication.      Current Activity/ Functional Status: Patient is independent with mobility/ambulation, transfers, ADL's, IADL's.   Zubrod Score: At the time of surgery this patient's most appropriate activity status/level should be described as: []     0    Normal activity, no symptoms [x]     1    Restricted in physical strenuous activity but ambulatory, able to do out light work []     2    Ambulatory and capable of self care, unable to do work activities, up and about                 more than 50%  Of the time                            []     3    Only limited self care, in bed greater than 50% of waking hours []     4    Completely disabled, no self care, confined to bed or chair []     5    Moribund  Past Medical History:  Diagnosis Date  . Anxiety   . Arthritis   . Depression   . Fibromyalgia   . MI (myocardial infarction) 01/2016    Past Surgical History:  Procedure Laterality Date  . ABDOMINAL HYSTERECTOMY    . CARDIAC CATHETERIZATION N/A 02/19/2016   Procedure: Left Heart Cath and Coronary Angiography;  Surgeon: Yates Decamp, MD;   Location: [facility] INVASIVE CV LAB;  Service: Cardiovascular;  Laterality: N/A;  . CARDIAC CATHETERIZATION N/A 02/19/2016   Procedure: Coronary Stent Intervention;  Surgeon: Yates Decamp, MD;  Location: [facility] INVASIVE CV LAB;  Service: Cardiovascular;  Laterality: N/A;  . cesarian      History  Smoking Status  . Former Smoker  . Quit date: 03/21/2011  Smokeless Tobacco  . Never Used    History  Alcohol Use No    Social History   Social History  . Marital status: Married    Spouse name: N/A  . Number of children: N/A  . Years of education: N/A   Occupational History  . Not on file.   Social History Main Topics  . Smoking status: Former Smoker    Quit date: 03/21/2011  . Smokeless tobacco: Never Used  . Alcohol use No  . Drug use: No  . Sexual activity: Not on file   Other  Topics Concern  . Not on file   Social History Narrative  . No narrative on file    Allergies  Allergen Reactions  . Bee Venom Other (See Comments)    Pus sites at injection  . Adhesive [Tape] Itching, Swelling and Other (See Comments)    Please use paper tape  . Triple Antibiotic [Bacitracin-Neomycin-Polymyxin] Rash    Current Facility-Administered Medications  Medication Dose Route Frequency Provider Last Rate Last Dose  . 0.9 %  sodium chloride infusion   Intravenous Continuous Lennette Bihari, MD 150 mL/hr at 10/22/16 1759 150 mL/hr at 10/22/16 1759  . acetaminophen (TYLENOL) tablet 1,000 mg  1,000 mg Oral Q6H PRN Fuller Plan, MD      . acetaminophen (TYLENOL) tablet 650 mg  650 mg Oral Q4H PRN Lennette Bihari, MD   650 mg at 10/22/16 1708  . albuterol (PROVENTIL) (2.5 MG/3ML) 0.083% nebulizer solution 3 mL  3 mL Inhalation Q4H PRN Fuller Plan, MD      . aspirin EC tablet 81 mg  81 mg Oral Daily Inez Catalina, MD   81 mg at 10/21/16 1059  . carvedilol (COREG) tablet 3.125 mg  3.125 mg Oral BID WC Fuller Plan, MD   3.125 mg at 10/22/16 0824  . cetirizine HCl (Zyrtec) 5 MG/5ML syrup  10 mg  10 mg Oral Daily Bethany Molt, DO   10 mg at 10/22/16 0827  . dextromethorphan-guaiFENesin (MUCINEX DM) 30-600 MG per 12 hr tablet 1 tablet  1 tablet Oral BID Bethany Molt, DO   1 tablet at 10/22/16 0823  . diclofenac sodium (VOLTAREN) 1 % transdermal gel 2 g  2 g Topical QID Bethany Molt, DO   2 g at 10/21/16 1750  . escitalopram (LEXAPRO) tablet 20 mg  20 mg Oral Daily Fuller Plan, MD   20 mg at 10/22/16 0823  . fluticasone (FLONASE) 50 MCG/ACT nasal spray 2 spray  2 spray Each Nare Daily Bethany Molt, DO   2 spray at 10/22/16 0823  . [START ON 10/23/2016] heparin ADULT infusion 100 units/mL (25000 units/221mL sodium chloride 0.45%)  1,000 Units/hr Intravenous Continuous Crystal S Robertson, RPH      . ketorolac (TORADOL) 30 MG/ML injection 30 mg  30 mg Intravenous Q8H PRN Bethany Molt, DO      . morphine 4 MG/ML injection 2 mg  2 mg Intravenous Q2H PRN Fuller Plan, MD   2 mg at 10/21/16 0816  . nitroGLYCERIN (NITROSTAT) SL tablet 0.4 mg  0.4 mg Sublingual Q5 Min x 3 PRN Fuller Plan, MD      . nitroGLYCERIN 50 mg in dextrose 5 % 250 mL (0.2 mg/mL) infusion  0-200 mcg/min Intravenous Titrated Lennette Bihari, MD      . ondansetron [facility]) injection 4 mg  4 mg Intravenous Q6H PRN Fuller Plan, MD      . ondansetron [facility]) injection 4 mg  4 mg Intravenous Q6H PRN Lennette Bihari, MD      . oxymetazoline (AFRIN) 0.05 % nasal spray 2 spray  2 spray Each Nare BID Bethany Molt, DO      . rosuvastatin (CRESTOR) tablet 40 mg  40 mg Oral q1800 Lennette Bihari, MD        Prescriptions Prior to Admission  Medication Sig Dispense Refill Last Dose  . acetaminophen (TYLENOL) 500 MG tablet Take 1,000 mg by mouth every 6 (six) hours as needed for headache.   Past Week  at Unknown time  . albuterol (PROVENTIL HFA;VENTOLIN HFA) 108 (90 Base) MCG/ACT inhaler Inhale 2 puffs into the lungs every 4 (four) hours as needed for wheezing or shortness of breath.   10/19/2016 at Unknown time    . aspirin EC 81 MG EC tablet Take 1 tablet (81 mg total) by mouth daily. 30 tablet 11 10/20/2016 at Unknown time  . carvedilol (COREG) 3.125 MG tablet Take 1 tablet (3.125 mg total) by mouth 2 (two) times daily with a meal. 60 tablet 6 10/20/2016 at 0600  . escitalopram (LEXAPRO) 20 MG tablet Take 20 mg by mouth daily.  1 10/20/2016 at Unknown time  . mupirocin nasal ointment (BACTROBAN) 2 % Apply rash bid 1 g 1 10/19/2016 at Unknown time  . nitroGLYCERIN (NITROSTAT) 0.4 MG SL tablet Place 1 tablet (0.4 mg total) under the tongue every 5 (five) minutes x 3 doses as needed for chest pain. 25 tablet 1 10/20/2016 at Unknown time  . rosuvastatin (CRESTOR) 20 MG tablet Take 1 tablet (20 mg total) by mouth daily at 6 PM. 30 tablet 6 10/19/2016 at Unknown time  . ticagrelor (BRILINTA) 90 MG TABS tablet Take 1 tablet (90 mg total) by mouth 2 (two) times daily. 60 tablet 6 10/20/2016 at 0830  . ketorolac (TORADOL) 10 MG tablet Take 1 tablet (10 mg total) by mouth every 8 (eight) hours as needed for moderate pain. (Patient not taking: Reported on 10/12/2016) 20 tablet 0 Completed Course at Unknown time  . traMADol (ULTRAM) 50 MG tablet Take 2 tablets (100 mg total) by mouth every 6 (six) hours as needed for moderate pain or severe pain. (Patient not taking: Reported on 10/12/2016) 15 tablet 0 Completed Course at Unknown time    Family History  Problem Relation Age of Onset  . COPD Mother   . Arthritis Mother   . Heart disease Mother   . Psoriasis Father      Review of Systems:      Cardiac Review of Systems: Y or N  Chest Pain [  y  ]  Resting SOB [ n  ] Exertional SOB  [ n ]  Orthopnea [ n ]   Pedal Edema [n   ]    Palpitations [n ] Syncope  [n  ]   Presyncope [ n  ]  General Review of Systems: [Y] = yes [  ]=no Constitional: recent weight change [  ]; anorexia [  ]; fatigue [  ]; nausea [  ]; night sweats [  ]; fever [  ]; or chills [  ]                                                                Dental: poor dentition[  ]; Last Dentist visit:   Eye : blurred vision [  ]; diplopia [   ]; vision changes [  ];  Amaurosis fugax[  ]; Resp: cough [  ];  wheezing[  ];  hemoptysis[  ]; shortness of breath[  ]; paroxysmal nocturnal dyspnea[  ]; dyspnea on exertion[  ]; or orthopnea[  ];  GI:  gallstones[  ], vomiting[  ];  dysphagia[  ]; melena[  ];  hematochezia [  ]; heartburn[  ];   Hx of  Colonoscopy[  ];  GU: kidney stones [  ]; hematuria[  ];   dysuria [  ];  nocturia[  ];  history of     obstruction [  ]; urinary frequency [  ]             Skin: rash, swelling[  ];, hair loss[  ];  peripheral edema[  ];  or itching[  ]; Musculosketetal: myalgias[  ];  joint swelling[  ];  joint erythema[  ];  joint pain[  ];  back pain[  ];  Heme/Lymph: bruising[  ];  bleeding[  ];  anemia[  ];  Neuro: TIA[  ];  headaches[  ];  stroke[  ];  vertigo[  ];  seizures[  ];   paresthesias[  ];  difficulty walking[  ];  Psych:depression[  ]; anxiety[  ];  Endocrine: diabetes[  ];  thyroid dysfunction[  ];  Immunizations: Flu [  ]; Pneumococcal[  ];  Other:  Physical Exam: BP (!) 105/56   Pulse (!) 56   Temp 98.7 F (37.1 C) (Oral)   Resp 12   Ht 5\' 4"  (1.626 m)   Wt 172 lb 14.4 oz (78.4 kg)   SpO2 96%   BMI 29.68 kg/m    General appearance: alert, cooperative, appears older than stated age and no distress Head: Normocephalic, without obvious abnormality, atraumatic Neck: no adenopathy, no carotid bruit, no JVD, supple, symmetrical, trachea midline and thyroid not enlarged, symmetric, no tenderness/mass/nodules Lymph nodes: Cervical, supraclavicular, and axillary nodes normal. Resp: clear to auscultation bilaterally Back: symmetric, no curvature. ROM normal. No CVA tenderness. Cardio: regular rate and rhythm, S1, S2 normal, no murmur, click, rub or gallop GI: soft, non-tender; bowel sounds normal; no masses,  no organomegaly Extremities: extremities normal, atraumatic, no cyanosis or  edema Neurologic: Grossly normal  Diagnostic Studies & Laboratory data:     Recent Radiology Findings:  Dg Chest 2 View  Result Date: 10/20/2016 CLINICAL DATA:  Sudden onset of chest pain starting today at work, history of MI EXAM: CHEST  2 VIEW COMPARISON:  10/12/2016 FINDINGS: Cardiomediastinal silhouette is stable. No infiltrate or pleural effusion. No pulmonary edema. Bony thorax is unremarkable. IMPRESSION: No active cardiopulmonary disease. Electronically Signed   By: Natasha Mead M.D.   On: 10/20/2016 14:50    I have independently reviewed the above radiologic studies.  Recent Lab Findings: Lab Results  Component Value Date   WBC 9.0 10/20/2016   HGB 11.0 (L) 10/20/2016   HCT 34.0 (L) 10/20/2016   PLT 269 10/20/2016   GLUCOSE 85 10/22/2016   CHOL 162 02/19/2016   TRIG 71 02/19/2016   HDL 33 (L) 02/19/2016   LDLCALC 115 (H) 02/19/2016   ALT 14 02/19/2016   AST 24 02/19/2016   NA 139 10/22/2016   K 3.9 10/22/2016   CL 109 10/22/2016   CREATININE 0.87 10/22/2016   BUN 19 10/22/2016   CO2 23 10/22/2016   TSH 0.905 02/20/2016   INR 1.04 10/20/2016   Cardiac cath films reviewed : Evidence of ostial left main, catheter passed through on several injections with damping of waveform per Dr Tresa Endo  . Also 50%  lesion before the stent in the RCa    Assessment / Plan:    Difficulty situation , patient has progression of ostial left main disease compared to previous cath 6 months ago. She is fully loaded with Brilinta significantly increasing bleeding risk with immediate surgery.  Films reviewed and patient seen with Dr Tresa Endo and Excell Seltzer, consensus is  to continue NTG, wash out Brilinta while on heparin, follow P2Y12 testing and proceed with CABG later in week. If urgent change ,left main  is suitable to stent  with no calcium but not ideal long term.    Delight Ovens MD      [address] [place] [place] [address] Office [phone]   Beeper  [phone]  10/22/2016 6:25 PM

## 2016-10-22 NOTE — Progress Notes (Signed)
ANTICOAGULATION CONSULT NOTE - Initial Consult  Pharmacy Consult for Heparin Indication: Severe LM dz  Allergies  Allergen Reactions  . Bee Venom Other (See Comments)    Pus sites at injection  . Adhesive [Tape] Itching, Swelling and Other (See Comments)    Please use paper tape  . Triple Antibiotic [Bacitracin-Neomycin-Polymyxin] Rash    Patient Measurements: Height: 5\' 4"  (162.6 cm) Weight: 172 lb 14.4 oz (78.4 kg) IBW/kg (Calculated) : 54.7 Heparin Dosing Weight:  71 kg  Vital Signs: Temp: 98.7 F (37.1 C) (03/26 1307) Temp Source: Oral (03/26 1307) BP: 99/50 (03/26 1639) Pulse Rate: 0 (03/26 1639)  Labs:  Recent Labs  10/20/16 1400 10/20/16 1505  10/20/16 2256 10/21/16 0142 10/21/16 0840 10/22/16 0406  HGB 11.0*  --   --   --   --   --   --   HCT 34.0*  --   --   --   --   --   --   PLT 269  --   --   --   --   --   --   LABPROT  --  13.6  --   --   --   --   --   INR  --  1.04  --   --   --   --   --   CREATININE 0.82  --   --   --   --   --  0.87  TROPONINI  --   --   < > <0.03 <0.03 <0.03  --   < > = values in this interval not displayed.  Estimated Creatinine Clearance: 77.5 mL/min (by C-G formula based on SCr of 0.87 mg/dL).   Medical History: Past Medical History:  Diagnosis Date  . Anxiety   . Arthritis   . Depression   . Fibromyalgia   . MI (myocardial infarction) 01/2016    Assessment: recurrent CP 51 y/o F with atypical CP underwent cath showing significant LM stenosis. Troponins were negative. Known h/o CAD. Plan to resume heparin post-cath. - Hgb 11, Plts 269  Goal of Therapy:  Heparin level 0.3-0.7 units/ml Monitor platelets by anticoagulation protocol: Yes   Plan:  D/c SQ Lovenox Start IV heparin at midnight at 1000 units/hr (no bolus post-cath) Daily heparin level and CBC   Richard Holz S. Merilynn Finland, PharmD, BCPS Clinical Staff Pharmacist Pager [phone]  Misty Stanley Stillinger 10/22/2016,4:40 PM

## 2016-10-22 NOTE — Progress Notes (Signed)
Progress Note  Patient Name: Carolyn Gaines Date of Encounter: 10/22/2016  Primary Cardiologist: Dr. Wyline Mood (new) previously a pt of Dr. Jacinto Halim  Subjective   Patient continues to have constant CP that is worth with exertion of movement of her arms. No SOB, quality of pain is unchanged since admission. 5/10 pressure/soreness  Inpatient Medications    Scheduled Meds: . aspirin EC  81 mg Oral Daily  . carvedilol  3.125 mg Oral BID WC  . cetirizine HCl  10 mg Oral Daily  . dextromethorphan-guaiFENesin  1 tablet Oral BID  . diclofenac sodium  2 g Topical QID  . enoxaparin (LOVENOX) injection  40 mg Subcutaneous Q24H  . escitalopram  20 mg Oral Daily  . fluticasone  2 spray Each Nare Daily  . rosuvastatin  20 mg Oral q1800  . sodium chloride flush  3 mL Intravenous Q12H  . ticagrelor  90 mg Oral BID   Continuous Infusions: . sodium chloride 1 mL/kg/hr (10/22/16 0824)   PRN Meds: sodium chloride, acetaminophen, albuterol, morphine injection, nitroGLYCERIN, ondansetron (ZOFRAN) IV, sodium chloride flush   Vital Signs    Vitals:   10/21/16 2031 10/21/16 2143 10/22/16 0521 10/22/16 0827  BP: (!) 107/58 (!) 93/49 96/74 93/77   Pulse: 65 71 71   Resp:  16    Temp:  98.4 F (36.9 C) 97.8 F (36.6 C)   TempSrc:  Axillary Oral   SpO2: 96% 98% 97%   Weight:   172 lb 14.4 oz (78.4 kg)   Height:   5\' 4"  (1.626 m)     Intake/Output Summary (Last 24 hours) at 10/22/16 0909 Last data filed at 10/22/16 0900  Gross per 24 hour  Intake            237.3 ml  Output                0 ml  Net            237.3 ml   Filed Weights   10/20/16 1743 10/21/16 0500 10/22/16 0521  Weight: 176 lb 3.2 oz (79.9 kg) 174 lb 4.8 oz (79.1 kg) 172 lb 14.4 oz (78.4 kg)    Telemetry    NSR, HR 63, pair PVC's- Personally Reviewed  ECG    None since 10/21/2016 - Personally Reviewed  Physical Exam   GEN: No acute distress.   Neck: No JVD Cardiac: RRR, no murmurs, rubs, or gallops. +tenderness to  palpation of chest wall, increased pain with ROM of arms or exertion. Respiratory: Clear to auscultation bilaterally.  GI: Soft, nontender, non-distended  MS: No edema; No deformity. Neuro:  Nonfocal  Psych: Normal affect   Labs    Chemistry Recent Labs Lab 10/20/16 1400 10/22/16 0406  NA 139 139  K 3.6 3.9  CL 109 109  CO2 24 23  GLUCOSE 79 85  BUN 16 19  CREATININE 0.82 0.87  CALCIUM 8.2* 8.2*  GFRNONAA >60 >60  GFRAA >60 >60  ANIONGAP 6 7     Hematology Recent Labs Lab 10/20/16 1400  WBC 9.0  RBC 3.81*  HGB 11.0*  HCT 34.0*  MCV 89.2  MCH 28.9  MCHC 32.4  RDW 15.0  PLT 269    Cardiac Enzymes Recent Labs Lab 10/20/16 2024 10/20/16 2256 10/21/16 0142 10/21/16 0840  TROPONINI <0.03 <0.03 <0.03 <0.03    Recent Labs Lab 10/20/16 1403  TROPIPOC 0.00    DDimer  Recent Labs Lab 10/20/16 1505  DDIMER 1.67*  Radiology    Dg Chest 2 View Result Date: 10/20/2016 No active cardiopulmonary disease.  Cardiac Studies    Transthoracic Echocardiography 10/21/2016 Study Conclusions  - Left ventricle: The cavity size was normal. Wall thickness was   normal. Systolic function was normal. The estimated ejection   fraction was in the range of 55% to 60%. Wall motion was normal;   there were no regional wall motion abnormalities. Left   ventricular diastolic function parameters were normal. - Aortic valve: Valve area (VTI): 2.47 cm^2. Valve area (Vmax):   2.22 cm^2. Valve area (Vmean): 2.54 cm^2. - Atrial septum: No defect or patent foramen ovale was identified. - Inferior vena cava: The vessel was dilated. The respirophasic   diameter changes were blunted (< 50%), consistent with elevated   central venous pressure.   Dr Jacinto Halim notes: 04/16/16 Exercise nuclear stress: 4 minutes, small septal scar with mild peri-infarct ischemia, small to medium sized anteroapical and apical scar without ischemia. LVEF 40%.    Coronary Stent Intervention  Left  Heart Cath and Coronary Angiography  Coronary angiogram  02/19/2016:  Mildly elevated LVEDP.   Mid RCA 99% stenosed S/P 3.0 x 30 mm resolute DES reduced to 0%, TIMI-3 to TIMI-3 flow.   Left main ostial 30-40%, mid LAD diffuse long segment disease of 20-30% followed by a focal 60-70% stenosis. Small ramus intermediate and circumflex without significant disease.  Recommendation: Patient had successful angioplasty to right coronary artery. Has a very large LAD with diffusely diseased proximal to mid segment diffuse disease and midsegment has a 60-70% stenosis which appears to be hazy at the third septal perforator which also appears to have a ulcerated lesion. She will need outpatient stress testing to evaluate the significance of the LAD stenosis. Left main stenosis does not appear to be significant and needs aggressive risk factor modification including statin therapy. Patient is motivated for smoking cessation.   Diagnostic Diagram       Post-Intervention Diagram           Outpatient Echocardiogram 03/21/2016 Left ventricle cavity is normal in size. Normal global wall motion. Normal diastolic filling pattern. Calculated EF 55%. Left atrial cavity is normal in size. Interatrial septal dropout without obvious PFO or ASD. Trace mitral regurgitation. Trace tricuspid regurgitation. Unable to estimate PA pressure due to absence/minimal TR signal. Essentially normal echocardiogram   Patient Profile     Carolyn Gaines is a 51 y.o. female with a history of CAD s/p DES to RCA, anxiety, depression, fibromyalgia, osteoarthritis and recurrent chest pain presents for chest pressure after lifting heavy box at work.   Assessment & Plan    1. Atypical chest painI: Troponin neg x 3, reproducible with palpation, getting a cath today. --On aspirin and Crestor --Plan for left cath with possible PCI today --SCr 0.87 -- Monitor I/O's: net gain of 1,637 L --Daily weights; + 4 lbs --BP soft will  continue to monitor --TEE; systolic function normal, EF 55-60%, normal wall motion. Left ventricular diastolic function normal. --Continue ASA, Brilinta, BB and statin.   2. CAD s/p DES to RCA:  inferior STEMI 01/2016 --Has residual 60-70% diffuse long LAD treated medically.   Suan Halter, PA-C  10/22/2016, 9:09 AM    Patient seen, examined. Available data reviewed. Agree with findings, assessment, and plan as outlined by Marlon Pel, PA-C. Exam reveals alert oriented woman in NAD, lungs CTA, CV RRR without murmur, abdomen soft, mild tenderness no mass or guarding, extremities without edema.   Data  reviewed. Negative enzymes. EKG without acute changes. Pain with typical and atypical features. Pt with known CAD and previous PCI, symptoms similar to past angina. Plan cath today. All questions answered. Further plans/disposition pending cath results.   Tonny Bollman, M.D. 10/22/2016 2:03 PM

## 2016-10-22 NOTE — Progress Notes (Signed)
eLink Physician-Brief Progress Note Patient Name: Carolyn Gaines DOB: [birth year] MRN: [id number]   Date of Service  10/22/2016  HPI/Events of Note  Coronary artery disease, needs CABG Stable on camera check  eICU Interventions  No eICU intervention needed     Intervention Category Evaluation Type: New Patient Evaluation  Max Fickle 10/22/2016, 7:04 PM

## 2016-10-22 NOTE — Progress Notes (Signed)
Subjective: Carolyn Gaines was seen and evaluated today at bedside. Complains of congestion, runny nose, post-nasal drip and coughing up some phlegm again. She also complains of fibromyalgia pains as well. Nervous about results of cath.  Objective:  Vital signs in last 24 hours: Vitals:   10/21/16 2031 10/21/16 2143 10/22/16 0521 10/22/16 0827  BP: (!) 107/58 (!) 93/49 96/74 93/77   Pulse: 65 71 71   Resp:  16    Temp:  98.4 F (36.9 C) 97.8 F (36.6 C)   TempSrc:  Axillary Oral   SpO2: 96% 98% 97%   Weight:   172 lb 14.4 oz (78.4 kg)   Height:   5\' 4"  (1.626 m)    General: In no acute distress. Resting comfortably in bed. Fatigued and uncomfortable.  HENT: Carolyn Gaines. No conjunctival injection, icterus or ptosis. Sounds congested. No sinus TTP. Cardiovascular: Regular rate and rhythm. No murmur or rub appreciated. TTP chest wall.  Pulmonary: CTA BL, no wheezing. Unlabored breathing.  Abdomen: Soft and non-distended. No guarding or rigidity. +bowel sounds.  Extremities: No peripheral edema noted BL. Diffuse MSK tenderness throughout.  Psych: Mood low and affect was mood congruent. Responds to questions appropriately.   04/16/16 Exercise nuclear stress: 4 minutes, small septal scar with mild peri-infarct ischemia, small to medium sized anteroapical and apical scar without ischemia. LVEF 40%.   Assessment/Plan:This is a 51 y/o F with known CAD (s/p DES RCA 01/2016) and prior MI who presents with features of typical and atypical chest pain following lifting a heavy box at work. Troponins negative and EKG without acute changes. Patient has fibromyalgia confounding the presentation.   Principal Problem:   Chest pain Active Problems:   History of acute inferior wall MI   CAD (coronary artery disease), native coronary artery   Depression with anxiety   Essential hypertension   Fibromyalgia  Chest Pain with typical and atypical features, Known CAD w/ DES in RCA 01/2016: Combination of both  typical and atypical chest pain. So far work-up negative for ACS (trops neg, EKG unchanged). Scheduled to have LHC today however has not yet been performed. She does have known multivessel disease and will await results of todays cath. Will likely d/c home today or tomorrow if cath unchanged.  -ASA, brilinta, coreg 3.125 and crestor 20mg  -BP soft -Morphine PRN, has not required any so far today. -Nitro PRN, has not required any since admission  Cough and Congestion: Persistent for several weeks, s/p 2 abx per patient 2 weeks ago for bronchitis. Pts syx of cough, nasal congestion, runny nose, sneezing and post-nasal drip c/w allergies, suspect this is playing a role. Takes flonase at home however was not ordered on admission due to pt omission. -Cetirizine -Afrin -Flonase -Mucinex DM  Hypertension, HLD: BP since admission soft. Coreg and crestor as above  Elevated D-Dimer: With low pre-test probability, modified geneva score of 0 and wells score of 0. Doubt PE as pt not hypoxic, tachycardic, tachypnic nor did she have recent surgery, or prolonged travel.   Fibromyalgia: In flare per patient. Continues to complain of diffuse body pain described as burning and pressure like throughout. CP feels similar. Has been on many different meds per patient however havent found many that help.  -Voltaren gel QID -Toradol PRN -- she has been using this medication often, could consider long-acting PO anti-inflammatory on d/c like Meloxicam vs Diclofenac.  -K-pad prn  Depression and Anxiety: Continue Lexapro 20 mg daily.   Dispo: Anticipated discharge today or tomorrow,  depending on cath results.   Carolyn Carrero, DO 10/22/2016, 12:55 PM Pager: [phone]

## 2016-10-22 NOTE — Progress Notes (Signed)
Subjective: Today Carolyn Gaines reports no acute events over night. Her chest pain still feels like 6/10 pressure in her chest and is worse when she presses on it. She says that her cough and runny nose have not improved and she is requesting some medication for this as the cough exacerbates her chest pain. She is complaining of severe diffuse pain secondary to fibromyalgia that is worst in her shoulders. She reports some minor abdominal pain w/ pressure during her echo and a history of GI bleeding, but this is likely secondary to her fibromyalgia. She was tearful and hopeful that her catheterization gives her an answer to why she keeps having chest pain.   Objective: Vital signs in last 24 hours: Vitals:   10/21/16 2031 10/21/16 2143 10/22/16 0521 10/22/16 0827  BP: (!) 107/58 (!) 93/49 96/74 93/77   Pulse: 65 71 71   Resp:  16    Temp:  98.4 F (36.9 C) 97.8 F (36.6 C)   TempSrc:  Axillary Oral   SpO2: 96% 98% 97%   Weight:   78.4 kg (172 lb 14.4 oz)   Height:   5\' 4"  (1.626 m)     Physical exam:  General: obese, no acute distress HEENT: PEERLA, oral pharynx clear, mucus membranes moist, rhinorrhea Pulmonary: chest wall tender to palpation, no wheezes, ralse or ronchi. Normal work of breathing Cardio: RRR, no murmur Musculoskeletal: diffuse tenderness to palpation secondary of fibromyalgia, worst in shoulders Skin: warm and dry Psych: tearful, answers questions appropriately   Lab Results: INR= 1.04 (done yesterday pre-cath to assess bleeding risk. This is normal) Hemoglobin = 11.0 (mild normocytic anemia) Troponin =  <0.03 (trended every 3 hours, have remained negative throughout stay)  Studies/Results: Dg Chest 2 View  Result Date: 10/20/2016 CLINICAL DATA:  Sudden onset of chest pain starting today at work, history of MI EXAM: CHEST  2 VIEW COMPARISON:  10/12/2016 FINDINGS: Cardiomediastinal silhouette is stable. No infiltrate or pleural effusion. No pulmonary edema. Bony  thorax is unremarkable. IMPRESSION: No active cardiopulmonary disease. Electronically Signed   By: Natasha Mead M.D.   On: 10/20/2016 14:50   Medications: Scheduled Meds: . aspirin EC  81 mg Oral Daily  . carvedilol  3.125 mg Oral BID WC  . cetirizine HCl  10 mg Oral Daily  . dextromethorphan-guaiFENesin  1 tablet Oral BID  . diclofenac sodium  2 g Topical QID  . enoxaparin (LOVENOX) injection  40 mg Subcutaneous Q24H  . escitalopram  20 mg Oral Daily  . fluticasone  2 spray Each Nare Daily  . rosuvastatin  20 mg Oral q1800  . sodium chloride flush  3 mL Intravenous Q12H  . ticagrelor  90 mg Oral BID   Continuous Infusions: . sodium chloride 1 mL/kg/hr (10/22/16 0824)   PRN Meds:.sodium chloride, acetaminophen, albuterol, morphine injection, nitroGLYCERIN, ondansetron (ZOFRAN) IV, sodium chloride flush Assessment/Plan: Principal Problem: Chest Pain Active Problems: Fibromyalgia, HTN, Depression with Anxiety   Carolyn Gaines is a 51 year old woman with a history of MI (acute inferior wall, 01/2016, RCA stent placed), CAD, Fibromyalgia, and depression with anxiety who presented today after acute onset, constant, burning, substernal, pressure-like chest pain which began after she lifted a heavy box at work about 1 hour before arrival in the ED.  Chest Pain: Carolyn Gaines presented with acute, constant, burning, substernal, pressure-like chest pain that began with exertion after living a heavy box 3-5 hours before admission. This pain did not improve with rest, but went from 10/10 to  5/10 with nitroglycerine. She rated her pain as a 6-7/10 after additional nitroglycerine and morphine and described it like an elephant sitting on her chest.  She reports associated SOB. She denies diaphoresis, nausea, vomiting, or abdominal pain. After her previous MI 9 months ago she takes aspirin, carvidelol, enoxaparin, rosuvastatin, and ticagrelor daily, and she reports compliance with these medications. Today her  chest pain is unchanged. She has had no changes on cardiac telemetry, and her troponins have remained less than 0.03 for the past 2 days. On exam today her pain was reproducible by palpation. She has diffuse chronic muscle pain that is tender to palpation on exam secondary to her fibromyalgia. In addition she has a recent history of severe cough and bronchitis treated at [facility] ED 10 days ago that could cause pleuritic chest pain. Her pulmonary exam was non-focal. It is likely that her chest pain is secondary to her bronchitis or fibromyalgia, at her previous admissions her pain has resolved with Toradol. Given her advanced history of CAD w/ MI and stent placement in RCA in 2017, cardiology recommended a repeat catheterization for her yesterday. After she has the procedure today we will know more about whether her pain is ischemic in nature, and if it is not we will work towards discharge. We will continue to monitor her on telemetry and leave O2, morphine, and nitroglycerine as PRN medications. We will continue her aspirin, carvidelol, enoxaparin, rosuvastatin, and ticagrelor to reduce cardiac oxygen demand and deleterious remodeling.  -O2 as needed -morphine as needed  -nitroglycerine as needed  -continue telemetry -continue aspirin -continue carvidelol -continue enoxaparin -continue rosuvastatin -continue ticagrelor -cardiac catheterization  Fibromyalgia: Pt has a history of diffuse chronic muscle pain due to fibromyalgia. It is particularly severe around her shoulders. This pain could be contributing to her reproducible chest pain. She has morphine PRN for her chest pain which should also manage her fibromyalgia pain. We have prescribed her Voltaren gel with minimal relief.  -morphine as needed  -Voltaren 1% transdermal gel 2 grams  Bronchitis: Pt was seen for a cough that was diagnosed as bronchitis at [facility] two weeks ago. She was prescribed antibiotics but this has not resolved, and  the cough exacerbates her chest pain. This is likely secondary to a viral URI. We will give her Musinex, Flonase, Afrin, and zyrtec to see if this improves her symptoms.  -dextromethrophan-guanifesin 30-600 per 12 hr -cetirizine HCI 5mg /ML syrup 10 MG -oxymetazoline 0.05% nasal spray 2 sprays -fluticasone 70mcg/act nasal spary 2 sprays   Depression with anxiety: Pt has a chronic history of depression with anxiety. She was tearful during our conversation today, but answering questions appropriately. Her anxiety may elevate her concern about chest pain after her MI explaining her multiple admissions questionable ischemic pain. Will continue her Lexapro. -lexapro  Hypertension: Pt has a history of essential hypertension. Her BP has been low after nitroglycerine bottoming out at 98/56. Will continue to monitor. We will continue her carvidelol. -carvedilol 3.125 mg  Normocytic anemia: Her Hgb is 11.0 on arival, she has a chronic history of normocytic anemia and this value is stable relative. She has no hematuria or evidence for GI bleed. Will continue to monitor.  DVT prophylaxis: low molecular weight heparin Code Status: Full code, discussed with patient at bedside  LOS: 3 days Disposition:Discharge pending results of cardiac catheterization today  This is a Psychologist, occupational Note.  The care of the patient was discussed with Dr. Vincente Liberty and the assessment and plan was  formulated with their assistance.  Please see their note for official documentation of the patient encounter.   Harvie Junior, Medical Student 10/22/2016, 11:06 AM

## 2016-10-23 ENCOUNTER — Inpatient Hospital Stay (HOSPITAL_COMMUNITY): Payer: BLUE CROSS/BLUE SHIELD

## 2016-10-23 ENCOUNTER — Encounter (HOSPITAL_COMMUNITY): Payer: Self-pay | Admitting: Cardiovascular Disease

## 2016-10-23 DIAGNOSIS — I200 Unstable angina: Secondary | ICD-10-CM

## 2016-10-23 LAB — SPIROMETRY WITH GRAPH
FEF 25-75 Post: 1.41 L/sec
FEF 25-75 Pre: 1.76 L/sec
FEF2575-%Change-Post: -19 %
FEF2575-%Pred-Post: 51 %
FEF2575-%Pred-Pre: 64 %
FEV1-%Change-Post: -2 %
FEV1-%Pred-Post: 62 %
FEV1-%Pred-Pre: 63 %
FEV1-Post: 1.72 L
FEV1-Pre: 1.76 L
FEV1FVC-%Change-Post: -13 %
FEV1FVC-%Pred-Pre: 96 %
FEV6-%Change-Post: 20 %
FEV6-%Pred-Post: 74 %
FEV6-%Pred-Pre: 62 %
FEV6-Post: 2.55 L
FEV6-Pre: 2.12 L
FEV6FVC-%Change-Post: 0 %
FEV6FVC-%Pred-Post: 102 %
FEV6FVC-%Pred-Pre: 103 %
FVC-%Change-Post: 13 %
FVC-%Pred-Post: 73 %
FVC-%Pred-Pre: 65 %
FVC-Post: 2.58 L
FVC-Pre: 2.28 L
Post FEV1/FVC ratio: 67 %
Post FEV6/FVC ratio: 100 %
Pre FEV1/FVC ratio: 77 %
Pre FEV6/FVC Ratio: 100 %

## 2016-10-23 LAB — BASIC METABOLIC PANEL
Anion gap: 7 (ref 5–15)
BUN: 13 mg/dL (ref 6–20)
CALCIUM: 8.2 mg/dL — AB (ref 8.9–10.3)
CO2: 19 mmol/L — ABNORMAL LOW (ref 22–32)
CREATININE: 0.74 mg/dL (ref 0.44–1.00)
Chloride: 113 mmol/L — ABNORMAL HIGH (ref 101–111)
GFR calc Af Amer: >60 mL/min (ref >60)
GLUCOSE: 82 mg/dL (ref 65–99)
POTASSIUM: 4.4 mmol/L (ref 3.5–5.1)
Sodium: 139 mmol/L (ref 135–145)

## 2016-10-23 LAB — CBC
HEMATOCRIT: 35.7 % — AB (ref 36.0–46.0)
Hemoglobin: 11.1 g/dL — ABNORMAL LOW (ref 12.0–15.0)
MCH: 28.6 pg (ref 26.0–34.0)
MCHC: 31.1 g/dL (ref 30.0–36.0)
MCV: 92 fL (ref 78.0–100.0)
PLATELETS: 196 10*3/uL (ref 150–400)
RBC: 3.88 MIL/uL (ref 3.87–5.11)
RDW: 15.4 % (ref 11.5–15.5)
WBC: 5.8 10*3/uL (ref 4.0–10.5)

## 2016-10-23 LAB — HEPARIN LEVEL (UNFRACTIONATED)
HEPARIN UNFRACTIONATED: 0.33 [IU]/mL (ref 0.30–0.70)
Heparin Unfractionated: 0.41 IU/mL (ref 0.30–0.70)

## 2016-10-23 LAB — PLATELET INHIBITION P2Y12: PLATELET FUNCTION P2Y12: 180 [PRU] — AB (ref 194–418)

## 2016-10-23 MED ORDER — OXYCODONE HCL 5 MG PO TABS
5.0000 mg | ORAL_TABLET | ORAL | Status: DC | PRN
  Administered 2016-10-23 – 2016-10-25 (×4): 5 mg via ORAL
  Filled 2016-10-23 (×4): qty 1

## 2016-10-23 MED ORDER — ALBUTEROL SULFATE (2.5 MG/3ML) 0.083% IN NEBU
2.5000 mg | INHALATION_SOLUTION | Freq: Once | RESPIRATORY_TRACT | Status: AC
  Administered 2016-10-23: 2.5 mg via RESPIRATORY_TRACT

## 2016-10-23 MED ORDER — DOCUSATE SODIUM 100 MG PO CAPS: 100.0000 mg | ORAL_CAPSULE | Freq: Every day | ORAL | Status: DC | PRN

## 2016-10-23 MED ORDER — SENNA 8.6 MG PO TABS: 1.0000 | ORAL_TABLET | Freq: Every day | ORAL | Status: DC | PRN

## 2016-10-23 NOTE — Progress Notes (Signed)
Morphine 2 mg given for ongoing chest pain. 2 mg wasted and witnessed by Banka Robert RN

## 2016-10-23 NOTE — Progress Notes (Signed)
ANTICOAGULATION CONSULT NOTE - Follow Up Consult  Pharmacy Consult for heparin Indication: severe LM stenosis pending CABG  Allergies  Allergen Reactions  . Bee Venom Other (See Comments)    Pus sites at injection  . Adhesive [Tape] Itching, Swelling and Other (See Comments)    Please use paper tape  . Triple Antibiotic [Bacitracin-Neomycin-Polymyxin] Rash    Patient Measurements: Height: 5\' 4"  (162.6 cm) Weight: 181 lb 10.5 oz (82.4 kg) IBW/kg (Calculated) : 54.7 Heparin Dosing Weight: 72.6 kg  Vital Signs: Temp: 100.2 F (37.9 C) (03/27 1200) Temp Source: Oral (03/27 0800) BP: 105/65 (03/27 1200) Pulse Rate: 72 (03/27 1200)  Labs:  Recent Labs  10/20/16 1505  10/20/16 2256 10/21/16 0142 10/21/16 0840 10/22/16 0406 10/23/16 0723 10/23/16 1306  HGB  --   --   --   --   --   --  11.1*  --   HCT  --   --   --   --   --   --  35.7*  --   PLT  --   --   --   --   --   --  196  --   LABPROT 13.6  --   --   --   --   --   --   --   INR 1.04  --   --   --   --   --   --   --   HEPARINUNFRC  --   --   --   --   --   --  0.41 0.33  CREATININE  --   --   --   --   --  0.87 0.74  --   TROPONINI  --   < > <0.03 <0.03 <0.03  --   --   --   < > = values in this interval not displayed.  Estimated Creatinine Clearance: 86.4 mL/min (by C-G formula based on SCr of 0.74 mg/dL).   Medications: heparin   Assessment: recurrent chest pain  51 y/o female with known history of CAD presents with atypical CP underwent cath showing significant LM stenosis. Troponins negative. Started on heparin post-cath with plan for CABG later this week pending brilinta washout. Heparin level therapeutic x2. Platelets down, but hemoglobin stable. No bleeding noted per nurse.   Goal of Therapy:  Heparin level 0.3-0.7 units/ml Monitor platelets by anticoagulation protocol: Yes   Plan:  Continue IV heparin at 1000 units/hr  Daily heparin level and CBC Monitor signs and symptoms of  bleeding  Alfredo Bach, BS, PharmD Clinical Pharmacy Resident [phone] (Pager) 10/23/2016 2:19 PM

## 2016-10-23 NOTE — Progress Notes (Signed)
ANTICOAGULATION CONSULT NOTE - Follow Up Consult  Pharmacy Consult for heparin Indication: severe LM stenosis  Allergies  Allergen Reactions  . Bee Venom Other (See Comments)    Pus sites at injection  . Adhesive [Tape] Itching, Swelling and Other (See Comments)    Please use paper tape  . Triple Antibiotic [Bacitracin-Neomycin-Polymyxin] Rash    Patient Measurements: Height: 5\' 4"  (162.6 cm) Weight: 181 lb 10.5 oz (82.4 kg) IBW/kg (Calculated) : 54.7 Heparin Dosing Weight: 72.6 kg  Vital Signs: Temp: 99.2 F (37.3 C) (03/27 0800) Temp Source: Oral (03/27 0800) BP: 105/65 (03/27 0600) Pulse Rate: 59 (03/27 0600)  Labs:  Recent Labs  10/20/16 1400 10/20/16 1505  10/20/16 2256 10/21/16 0142 10/21/16 0840 10/22/16 0406 10/23/16 0723  HGB 11.0*  --   --   --   --   --   --  11.1*  HCT 34.0*  --   --   --   --   --   --  35.7*  PLT 269  --   --   --   --   --   --  196  LABPROT  --  13.6  --   --   --   --   --   --   INR  --  1.04  --   --   --   --   --   --   HEPARINUNFRC  --   --   --   --   --   --   --  0.41  CREATININE 0.82  --   --   --   --   --  0.87 0.74  TROPONINI  --   --   < > <0.03 <0.03 <0.03  --   --   < > = values in this interval not displayed.  Estimated Creatinine Clearance: 86.4 mL/min (by C-G formula based on SCr of 0.74 mg/dL).   Medications: heparin   Assessment: recurrent chest pain  51 y/o female with atypical CP underwent cath showing significant LM stenosis. Troponins were negative. Known h/o CAD. Started on heparin post-cath with plan for CABG later this week. Heparin level therapeutic x1. Need confirmatory heparin level check. Platelet drop 269 >> 196. Hemoglobin stable.    Goal of Therapy:  Heparin level 0.3-0.7 units/ml Monitor platelets by anticoagulation protocol: Yes   Plan:  Continue IV heparin at 1000 units/hr  Recheck heparin level in 6 hours Follow up heparin level and CBC daily while on heparin  Monitor platelets  carefully   Chales Abrahams, PharmD Candidate 10/23/2016,9:22 AM

## 2016-10-23 NOTE — Progress Notes (Signed)
Subjective: Carolyn Gaines reports no acute events over night. She continues to describe her chest pain as a central, pressure-like sensation. She denies SOB or diaphoresis. She is accepting of her prognosis after her catheterization last night and although anxious about her forthcoming CABG she is relieved to have an answer for what was causing her pain. She continues to report  chronic muscular pain from fibromyalgia.   Objective: Vital signs in last 24 hours: Vitals:   10/23/16 0500 10/23/16 0600 10/23/16 0700 10/23/16 0800  BP: 101/60 105/65    Pulse: (!) 59 (!) 59    Resp: 13 12    Temp:   98.8 F (37.1 C) 99.2 F (37.3 C)  TempSrc:   Oral Oral  SpO2: 96% 96%    Weight:      Height:       Physical Exam:  General: obese, no acute distress HEENT: PEERLA, oral pharynx clear, mucus membranes moist, rhinorrhea Pulmonary: chest wall tender to palpation, no wheezes, ralse or ronchi. Normal work of breathing Cardio: RRR, no murmur Musculoskeletal: diffuse tenderness to palpation secondary of fibromyalgia, worst in shoulders Skin: warm and dry Psych: normal affect, answers questions appropriately    Lab Results: Hemoglobin = 11.1 (mild normocytic anemia)  Micro Results: Recent Results (from the past 240 hour(s))  MRSA PCR Screening     Status: None   Collection Time: 10/22/16  6:31 PM  Result Value Ref Range Status   MRSA by PCR NEGATIVE NEGATIVE Final    Comment:        The GeneXpert MRSA Assay (FDA approved for NASAL specimens only), is one component of a comprehensive MRSA colonization surveillance program. It is not intended to diagnose MRSA infection nor to guide or monitor treatment for MRSA infections.    Studies/Results:  Cardiac catheterization: severe native CAD 80-90% osteial stenosis of left main coronary artery, did not improve following IC nitroglycerine, 50% mid lad stenosis, normal left circumflex, and patent mid RCA stent with 45-50% smooth eccentric  stenosis proximal to the stented segment and 20% proximal irregularity. Mild residual LV dysfunction with ejection fraction 45-50% with mild inferior hypercontractility.   Medications:  Scheduled Meds: . aspirin EC  81 mg Oral Daily  . carvedilol  3.125 mg Oral BID WC  . cetirizine HCl  10 mg Oral Daily  . dextromethorphan-guaiFENesin  1 tablet Oral BID  . diclofenac sodium  2 g Topical QID  . escitalopram  20 mg Oral Daily  . fluticasone  2 spray Each Nare Daily  . oxymetazoline  2 spray Each Nare BID  . rosuvastatin  40 mg Oral q1800   Continuous Infusions: . sodium chloride 75 mL/hr at 10/23/16 0110  . heparin 1,000 Units/hr (10/23/16 0600)  . nitroGLYCERIN 10 mcg/min (10/23/16 0600)   PRN Meds:.sodium chloride, acetaminophen, acetaminophen, albuterol, diazepam, morphine injection, nitroGLYCERIN, ondansetron (ZOFRAN) IV, ondansetron (ZOFRAN) IV, oxyCODONE, sodium chloride flush Assessment/Plan: Principal Problem: Chest Pain Active Problems: Fibromyalgia, HTN, Depression with Anxiety   Carolyn Gaines is a 51 year old woman with a history of MI (acute inferior wall, 01/2016, RCA stent placed), CAD, Fibromyalgia, and depression with anxiety who presented today after acute onset, constant, burning, substernal, pressure-like chest pain which began after she lifted a heavy box at work.   Chest Pain: Carolyn Gaines presented with acute, constant, burning, substernal, pressure-like, 6/10 chest pain that began with exertion after living a heavy box 3-5 hours before admission, and improved with nitroglycerine. Today her chest pain is unchanged. On exam  her pain was reproducible by palpation. She denies diaphoresis, nausea, vomiting, or abdominal pain. Her catheterization last night demonstrated severe CAD including 50% stenosis proximal to her RCA stent. Cardiology recommends a CABG surgery for her later this week following several days of washout for her ticagrelor. Ticagrelor is a P2y12 agonist, that  prevents ADP mediated activation of GPIIb/IIIa and reduces platelet aggregation. In order to perform the surgery, this anti-platelet medication will need to be out of her system to prevent bleeding complications, and the body will need to produce a sufficient amount of new un-affected platelets. In the meantime she will have cardiac rehab and spirometry to ensure that her lung function is sufficient to tolerate intubation during surgery. It is necessary for her to have a bypass graft given near failure of stent placement since 2017, and imminent risk of ischemic event. She will also be transferred to the ICU in case she needs an emergency stent procedure before her CABG and placed on a nitro drip to reduce cardiac oxygen demand. We will continue her aspirin, carvidelol, enoxaparin, rosuvastatin, and to reduce cardiac oxygen demand and deleterious remodeling.  -O2 as needed -morphine as needed  -nitroglycerine drip -continue telemetry -continue aspirin -continue carvidelol -continue enoxaparin -continue rosuvastatin -discobntinue ticagrelor and wash out -CABG forthcoming  Fibromyalgia: Pt has a history of diffuse chronic muscle pain due to fibromyalgia. Her flare continues today, is particularly severe around her shoulders. This pain could be contributing to her reproducible chest pain. We will continue Voltaren gel.  -Voltaren 1% transdermal gel 2 grams  Bronchitis: Pt was seen for a cough that was diagnosed as bronchitis at [facility] two weeks ago. We will give her Musinex, Flonase, Afrin, and zyrtec to see if this improves her symptoms.  -dextromethrophan-guanifesin 30-600 per 12 hr -cetirizine HCI 5mg /ML syrup 10 MG -oxymetazoline 0.05% nasal spray 2 sprays -fluticasone 68mcg/act nasal spary 2 sprays   Depression with anxiety: Pt has a chronic history of depression with anxiety. She was less tearful today and accepting of her forthcoming CABG. Will continue her  Lexapro. -lexapro  Hypertension: Pt has a history of essential hypertension. Will continue to monitor. Controlled with carvidelol. -carvedilol 3.125 mg  Normocytic anemia: Her Hgb is 11.0 on arival, she has a chronic history of normocytic anemia and this value is stable relative. She has no hematuria or evidence for GI bleed. Will continue to monitor.  DVT prophylaxis: low molecular weight heparin Code Status: Full code, discussed with patient at bedside   LOS: 4 days Disposition: Discharge pending CABG procedure post Brelinta washout  This is a Psychologist, occupational Note.The care of the patient was discussed with Dr. Victorino Dike the assessment and plan was formulated with their assistance. Please see their note for official documentation of the patient encounter.   Harvie Junior, Medical Student 10/23/2016, 11:43 AM

## 2016-10-23 NOTE — Progress Notes (Signed)
1 Day Post-Op Procedure(s) (LRB): Left Heart Cath and Coronary Angiography (N/A) Subjective:  Still having her chronic chest pain that I suspect is more likely due to fibromyalgia. Received some morphine today which resolved it. No shortness of breath.  Objective: Vital signs in last 24 hours: Temp:  [97.8 F (36.6 C)-100.2 F (37.9 C)] 99 F (37.2 C) (03/27 1500) Pulse Rate:  [52-79] 79 (03/27 1500) Cardiac Rhythm: Normal sinus rhythm (03/27 0915) Resp:  [11-20] 19 (03/27 1500) BP: (85-112)/(49-92) 112/92 (03/27 1500) SpO2:  [92 %-99 %] 94 % (03/27 1500) Weight:  [82.4 kg (181 lb 10.5 oz)] 82.4 kg (181 lb 10.5 oz) (03/26 1830)  Hemodynamic parameters for last 24 hours:    Intake/Output from previous day: 03/26 0701 - 03/27 0700 In: 2240.3 [I.V.:2240.3] Out: 700 [Urine:700] Intake/Output this shift: Total I/O In: 861 [P.O.:570; I.V.:291] Out: 600 [Urine:600]  General appearance: alert and cooperative Heart: regular rate and rhythm, S1, S2 normal, no murmur, click, rub or gallop Lungs: clear to auscultation bilaterally  Lab Results:  Recent Labs  10/23/16 0723  WBC 5.8  HGB 11.1*  HCT 35.7*  PLT 196   BMET:  Recent Labs  10/22/16 0406 10/23/16 0723  NA 139 139  K 3.9 4.4  CL 109 113*  CO2 23 19*  GLUCOSE 85 82  BUN 19 13  CREATININE 0.87 0.74  CALCIUM 8.2* 8.2*    PT/INR: No results for input(s): LABPROT, INR in the last 72 hours. ABG    Component Value Date/Time   TCO2 25 02/19/2016 1944   CBG (last 3)  No results for input(s): GLUCAP in the last 72 hours.  Assessment/Plan:  Left main and multi-vessel CAD. On Brilinta until yesterday. P2Y12 180 today. Will plan to do CABG on Friday.   LOS: 2 days    Alleen Borne 10/23/2016

## 2016-10-23 NOTE — Progress Notes (Signed)
   Subjective: Mrs. Toon was seen and evaluated today at bedside. Continues to complain of chest pain although unchanged. No SOB or diaphoresis. No complaints overnight.   Objective:  Vital signs in last 24 hours: Vitals:   10/23/16 0500 10/23/16 0600 10/23/16 0700 10/23/16 0800  BP: 101/60 105/65    Pulse: (!) 59 (!) 59    Resp: 13 12    Temp:   98.8 F (37.1 C) 99.2 F (37.3 C)  TempSrc:   Oral Oral  SpO2: 96% 96%    Weight:      Height:       General: In no acute distress. Resting comfortably in bed. Pleasant and upbeat today. HENT: [place]/AT. No conjunctival injection, icterus or ptosis. Less congested today. Cardiovascular: Regular rate and rhythm. No murmur or rub appreciated.  Pulmonary: CTA BL, no wheezing. Unlabored breathing.  Extremities: No peripheral edema noted BL. Diffuse MSK tenderness throughout.  Psych: Mood normal and affect was mood congruent. Responds to questions appropriately.   04/16/16 Exercise nuclear stress: 4 minutes, small septal scar with mild peri-infarct ischemia, small to medium sized anteroapical and apical scar without ischemia. LVEF 40%.   LHC 10/22/16: Severe native CAD with 80-90% stenosis of left main coronary. Did not improve after IC nitro. 50% mid LAD stenosis. Patent mid RCA stent but with 50% proximal stenosis.   Assessment/Plan:This is a 51 y/o F with known CAD (s/p DES RCA 01/2016) and prior MI who presents with features of typical and atypical chest pain following lifting a heavy box at work. Troponins negative and EKG without acute changes. LHC with severe native disease requiring CABG. Currently awaiting brilinta wash out.   Principal Problem:   Chest pain Active Problems:   History of acute inferior wall MI   CAD (coronary artery disease), native coronary artery   Depression with anxiety   Essential hypertension   Fibromyalgia   Unstable angina (HCC)  Native vessel CAD with 80-90% stenosis of left main coronary with stenosis  proximal to RCA stent: Per yesterdays LHC. She requires CABG however will need to allow brilinta to wash out over the next several days. Currently is stable, although if decompensates could have stenting of LCA. She was moved to cardiac ICU overnight and started on nitro and heparin drips.  -Continue to appreciate cardiology and CVTS recommendations -ASA 81 mg daily -Coreg 3.125 mg  -Nitro and heparin per cards.  Cough and Congestion: Improved on exam today following conservative therapy, will be continued.  -Cetirizne -Afrin -Flonase -Mucinex DM  Hypertension, HLD: BP since admission soft. Coreg and crestor as above  Fibromyalgia: In flare per patient. Continues to complain of diffuse body pain described as burning and pressure like throughout. -Voltaren gel QID -Toradol PRN -K-pad prn  Depression and Anxiety: Continue Lexapro 20 mg daily.   Dispo: Anticipated discharge unclear, ~ 1week.   Besan Ketchem, DO 10/23/2016, 9:48 AM Pager: [phone]

## 2016-10-23 NOTE — Progress Notes (Signed)
Progress Note  Patient Name: Carolyn Gaines Date of Encounter: 10/23/2016  Primary Cardiologist: Dr Wyline Mood  Subjective   Patient complains of constant chest discomfort in the sternal area and epigastrium. Complains of shortness of breath but seems comfortable. Complains of abdominal discomfort.  Inpatient Medications    Scheduled Meds: . aspirin EC  81 mg Oral Daily  . carvedilol  3.125 mg Oral BID WC  . cetirizine HCl  10 mg Oral Daily  . dextromethorphan-guaiFENesin  1 tablet Oral BID  . diclofenac sodium  2 g Topical QID  . escitalopram  20 mg Oral Daily  . fluticasone  2 spray Each Nare Daily  . oxymetazoline  2 spray Each Nare BID  . rosuvastatin  40 mg Oral q1800   Continuous Infusions: . sodium chloride 75 mL/hr at 10/23/16 0110  . heparin 1,000 Units/hr (10/23/16 0600)  . nitroGLYCERIN 10 mcg/min (10/23/16 0600)   PRN Meds: sodium chloride, acetaminophen, acetaminophen, albuterol, diazepam, morphine injection, nitroGLYCERIN, ondansetron (ZOFRAN) IV, ondansetron (ZOFRAN) IV, oxyCODONE, sodium chloride flush   Vital Signs    Vitals:   10/23/16 0700 10/23/16 0800 10/23/16 1000 10/23/16 1100  BP:   (!) 105/56 (!) 102/57  Pulse:   76 71  Resp:   17 14  Temp: 98.8 F (37.1 C) 99.2 F (37.3 C)    TempSrc: Oral Oral    SpO2:   97% 94%  Weight:      Height:        Intake/Output Summary (Last 24 hours) at 10/23/16 1202 Last data filed at 10/23/16 1100  Gross per 24 hour  Intake          2462.32 ml  Output              700 ml  Net          1762.32 ml   Filed Weights   10/21/16 0500 10/22/16 0521 10/22/16 1830  Weight: 174 lb 4.8 oz (79.1 kg) 172 lb 14.4 oz (78.4 kg) 181 lb 10.5 oz (82.4 kg)    Telemetry    Normal sinus rhythm without significant arrhythmia - Personally Reviewed  ECG    Normal sinus rhythm 59 bpm, within normal limits - Personally Reviewed  Physical Exam  No acute distress, alert and oriented GEN: No acute distress.   Neck: No  JVD Cardiac: RRR, no murmurs, rubs, or gallops.  Respiratory: Clear to auscultation bilaterally. GI: Soft, non-distended, mild diffuse tenderness without rebound or guarding MS: No edema; No deformity. Neuro:  Nonfocal  Psych: Normal affect   Labs    Chemistry Recent Labs Lab 10/20/16 1400 10/22/16 0406 10/23/16 0723  NA 139 139 139  K 3.6 3.9 4.4  CL 109 109 113*  CO2 24 23 19*  GLUCOSE 79 85 82  BUN 16 19 13   CREATININE 0.82 0.87 0.74  CALCIUM 8.2* 8.2* 8.2*  GFRNONAA >60 >60 >60  GFRAA >60 >60 >60  ANIONGAP 6 7 7      Hematology Recent Labs Lab 10/20/16 1400 10/23/16 0723  WBC 9.0 5.8  RBC 3.81* 3.88  HGB 11.0* 11.1*  HCT 34.0* 35.7*  MCV 89.2 92.0  MCH 28.9 28.6  MCHC 32.4 31.1  RDW 15.0 15.4  PLT 269 196    Cardiac Enzymes Recent Labs Lab 10/20/16 2024 10/20/16 2256 10/21/16 0142 10/21/16 0840  TROPONINI <0.03 <0.03 <0.03 <0.03    Recent Labs Lab 10/20/16 1403  TROPIPOC 0.00     BNPNo results for input(s): BNP, PROBNP  in the last 168 hours.   DDimer  Recent Labs Lab 10/20/16 1505  DDIMER 1.67*     Radiology    No results found.  Cardiac Studies   2-D echocardiogram: Study Conclusions  - Left ventricle: The cavity size was normal. Wall thickness was   normal. Systolic function was normal. The estimated ejection   fraction was in the range of 55% to 60%. Wall motion was normal;   there were no regional wall motion abnormalities. Left   ventricular diastolic function parameters were normal. - Aortic valve: Valve area (VTI): 2.47 cm^2. Valve area (Vmax):   2.22 cm^2. Valve area (Vmean): 2.54 cm^2. - Atrial septum: No defect or patent foramen ovale was identified. - Inferior vena cava: The vessel was dilated. The respirophasic   diameter changes were blunted (< 50%), consistent with elevated   central venous pressure.  Cardiac catheterization: Conclusion     Mid LAD lesion, 50 %stenosed.  Ost LM lesion, 85  %stenosed.  Mid RCA-1 lesion, 50 %stenosed.  Mid RCA-2 lesion, 0 %stenosed.  A drug eluting .  Prox RCA lesion, 20 %stenosed.   Severe native CAD with 80-90% ostial stenosis of the left main coronary artery which did not improve following IC nitroglycerin administration; 50% mid LAD stenoses; normal left circumflex vessel; and patent mid RCA stent with 45-50% smooth eccentric stenosis proximal to the stented segment and 20% proximal irregularity.  Mild  residual LV dysfunction with an ejection fraction of 45-50% with mild mid inferior hypocontractility.  RECOMMENDATION: The patient has been on full dose Brilinta since her DES stent implantation in July 2018.  Surgical consultation was obtained with Dr. Tyrone Sage.  Brilinta will be discontinued.  The patient will be stabilized on IV NTG with plans to reinstitute heparin. Plan for CABG surgery later this week following several days of Brilinta washout.   If the patient becomes unstable, consider emergent left main stenting.     Patient Profile     51 y.o. female with coronary artery disease, previous stenting of the right coronary artery in 2017, presented with chest pain (typical and atypical symptoms), but found to have severe left mainstem disease on angiography now awaiting cardiac surgery  Assessment & Plan    1. Unstable angina: Patient found to have severe left main stenosis at cardiac catheterization. She is treated with IV heparin and IV nitroglycerin. Brilinta is on hold and will be washed out before cardiac surgery. The patient is having constant chest discomfort which I do not take his ischemic chest pain based on her normal cardiac enzymes and EKG tracings. We'll continue to treat her with narcotic analgesics as needed. Otherwise continue current therapies with a high intensity statin drug, aspirin, heparin, and nitroglycerin.  2. Hyperlipidemia: Treated with Crestor 40 mg daily  3. Fibromyalgia: Per primary service.  4.  Dispo: CABG likely at end of this week after Brilinta washout. Appreciate Dr Dennie Maizes evaluation.  Enzo Bi, MD  10/23/2016, 12:02 PM

## 2016-10-23 NOTE — Progress Notes (Signed)
CARDIAC REHAB PHASE I   Pt with severe LM disease, not appropriate for ambulation with cardiac rehab at this time. Cardiac surgery pre-op education completed with pt and family at bedside. Reviewed IS, activity progression, sternal precautions, cardiac surgery booklet and cardiac surgery guidelines. Pt verbalized understanding, declined pre-op videos, anxious. Pt in bed, call bell within reach, will follow post-op.   [id number] Joylene Grapes, RN, BSN 10/23/2016 3:32 PM

## 2016-10-24 ENCOUNTER — Inpatient Hospital Stay (HOSPITAL_COMMUNITY): Payer: BLUE CROSS/BLUE SHIELD

## 2016-10-24 DIAGNOSIS — Z01810 Encounter for preprocedural cardiovascular examination: Secondary | ICD-10-CM

## 2016-10-24 DIAGNOSIS — E782 Mixed hyperlipidemia: Secondary | ICD-10-CM

## 2016-10-24 LAB — POCT I-STAT 3, ART BLOOD GAS (G3+)
Acid-base deficit: 2 mmol/L (ref 0.0–2.0)
Bicarbonate: 23.6 mmol/L (ref 20.0–28.0)
O2 Saturation: 100 %
PH ART: 7.341 — AB (ref 7.350–7.450)
TCO2: 25 mmol/L (ref 0–100)
pCO2 arterial: 43.7 mmHg (ref 32.0–48.0)
pO2, Arterial: 321 mmHg — ABNORMAL HIGH (ref 83.0–108.0)

## 2016-10-24 LAB — VAS US DOPPLER PRE CABG
LCCADDIAS: -35 cm/s
LEFT ECA DIAS: -16 cm/s
LEFT VERTEBRAL DIAS: -23 cm/s
LICADDIAS: -40 cm/s
Left CCA dist sys: -103 cm/s
Left ICA dist sys: -108 cm/s
Left ICA prox dias: -31 cm/s
Left ICA prox sys: -90 cm/s
RCCADSYS: -128 cm/s
RIGHT ECA DIAS: -17 cm/s
RIGHT VERTEBRAL DIAS: 23 cm/s
Right CCA prox dias: 30 cm/s
Right CCA prox sys: 121 cm/s

## 2016-10-24 LAB — CBC
HEMATOCRIT: 33.8 % — AB (ref 36.0–46.0)
HEMOGLOBIN: 10.4 g/dL — AB (ref 12.0–15.0)
MCH: 27.9 pg (ref 26.0–34.0)
MCHC: 30.8 g/dL (ref 30.0–36.0)
MCV: 90.6 fL (ref 78.0–100.0)
Platelets: 214 10*3/uL (ref 150–400)
RBC: 3.73 MIL/uL — AB (ref 3.87–5.11)
RDW: 15.1 % (ref 11.5–15.5)
WBC: 6.9 10*3/uL (ref 4.0–10.5)

## 2016-10-24 LAB — HEPARIN LEVEL (UNFRACTIONATED)
HEPARIN UNFRACTIONATED: 0.25 [IU]/mL — AB (ref 0.30–0.70)
Heparin Unfractionated: 0.5 IU/mL (ref 0.30–0.70)

## 2016-10-24 MED ORDER — FUROSEMIDE 10 MG/ML IJ SOLN
40.0000 mg | Freq: Once | INTRAMUSCULAR | Status: AC
  Administered 2016-10-24: 40 mg via INTRAVENOUS

## 2016-10-24 MED ORDER — FUROSEMIDE 10 MG/ML IJ SOLN
INTRAMUSCULAR | Status: AC
  Filled 2016-10-24: qty 4

## 2016-10-24 NOTE — Progress Notes (Signed)
2 Days Post-Op Procedure(s) (LRB): Left Heart Cath and Coronary Angiography (N/A) Subjective:  Had episode of desaturation this am possibly related to sedation and hypoventilation but fine now with sats 100% on RA. She feels ok. No anginal chest pain.   Objective: Vital signs in last 24 hours: Temp:  [98.1 F (36.7 C)-99.3 F (37.4 C)] 98.1 F (36.7 C) (03/28 1500) Pulse Rate:  [52-76] 64 (03/28 1500) Cardiac Rhythm: Normal sinus rhythm (03/28 1200) Resp:  [9-23] 18 (03/28 1400) BP: (88-109)/(54-76) 97/57 (03/28 1500) SpO2:  [75 %-100 %] 96 % (03/28 1500) FiO2 (%):  [45 %-100 %] 100 % (03/28 1151)  Hemodynamic parameters for last 24 hours:    Intake/Output from previous day: 03/27 0701 - 03/28 0700 In: 1897 [P.O.:810; I.V.:1087] Out: 1800 [Urine:1800] Intake/Output this shift: Total I/O In: 1160.3 [P.O.:840; I.V.:320.3] Out: 3450 [Urine:3450]  General appearance: alert and cooperative Heart: regular rate and rhythm, S1, S2 normal, no murmur, click, rub or gallop Lungs: clear to auscultation bilaterally  Lab Results:  Recent Labs  10/23/16 0723 10/24/16 0153  WBC 5.8 6.9  HGB 11.1* 10.4*  HCT 35.7* 33.8*  PLT 196 214   BMET:  Recent Labs  10/22/16 0406 10/23/16 0723  NA 139 139  K 3.9 4.4  CL 109 113*  CO2 23 19*  GLUCOSE 85 82  BUN 19 13  CREATININE 0.87 0.74  CALCIUM 8.2* 8.2*    PT/INR: No results for input(s): LABPROT, INR in the last 72 hours. ABG    Component Value Date/Time   PHART 7.341 (L) 10/24/2016 1147   HCO3 23.6 10/24/2016 1147   TCO2 25 10/24/2016 1147   ACIDBASEDEF 2.0 10/24/2016 1147   O2SAT 100.0 10/24/2016 1147   CBG (last 3)  No results for input(s): GLUCAP in the last 72 hours.  Assessment/Plan:  Left main and multi-vessel CAD. Brilinta washing out. Will check P2Y12 again tomorrow. Will plan to do CABG on Friday.    LOS: 3 days    Alleen Borne 10/24/2016

## 2016-10-24 NOTE — Progress Notes (Signed)
Pre-op Cardiac Surgery  Carotid Findings:  Bilateral 1-39% ICA stenosis, antegrade vertebral flow.   Upper Extremity Right Left  Brachial Pressures 113, Tri 99, Tri  Radial Waveforms Tri Tri  Ulnar Waveforms Tri Tri  Palmar Arch (Allen's Test) Waveform diminishes greater than 50% with radial compression and is minimally diminished with ulnar compression. Waveform is unchanged with radial compression and obliterates with ulnar compression.    Lower  Extremity Right Left  Dorsalis Pedis 121, Tri 108, Tri  Posterior Tibial 117, Tri 124, Tri  Ankle/Brachial Indices WNL WNL   Levin Bacon- RDMS, RVT 5:13 PM  10/24/2016

## 2016-10-24 NOTE — Progress Notes (Signed)
Was paged by RN at about 11:20 pm that the patients O2 sats dropped to the 70's. Cardiology already had been paged and had arrived at bedside. Patient reports she was laying in bed (about 30* angle per RN) and she suddenly heard a lot of alarms going off. She reports she feels fine other than feeling fatigued. Denied any dyspnea or any changes in her chest pain. She had been given oxycodone and valium this morning.  General: Fatigued appearing woman on non-re breather. Saturating 88-90% with mask.  Cardio: RRR, no murmur or rub appreciated Pulmonary: new bibasilar crackles appreciated. Some scattered crackles throughout.  Abdomen: soft, non-tender and non-tender.  Extremities: Warm and well perfused. No peripheral edema appreciated.   Assessment & Plan: CXR, ABG and IV Lasix ordered by cardiology and will f/u results. Otherwise, will continue to follow closely. Continue to appreciate cardiology recommendations.   Carolyn Gaines, D.O.

## 2016-10-24 NOTE — Progress Notes (Signed)
I was called to the bedside to evaluate the patient for oxygen desaturation. Her oxygen saturations have dropped into the 70s. She is now on a 100% nonrebreather facemask. She had been given oxycodone and Valium this morning.  On my evaluation, the patient is mildly somnolent but easily follows commands and takes deep breaths to command. Her oxygen saturation maintained remains in the 80s. She is comfortable and denies any shortness of breath. Her venous pressure is normal. Lungs are clear. Heart is regular rate and rhythm. There is no abdominal distention. There is no pretibial edema.  We'll check a stat portable chest x-ray, arterial blood gas, and continue oxygen by facemask. In light she becomes hypotensive or has significant hypercarbia on her blood gas, will not reverse the analgesic and benzodiazepine medications at this point. If she decompensates will need to give her reversal agents. I think the likelihood of pulmonary embolism is very low and she's been on IV heparin and she does not appear to be any distress at present. Her heart rate is within normal limits. We'll follow-up closely after her x-ray and blood gas are done. Will give her 1 dose of IV Lasix as she has been getting IV fluids now for over 24 hours.  Carolyn Gaines 10/24/2016 11:52 AM

## 2016-10-24 NOTE — Progress Notes (Signed)
ANTICOAGULATION CONSULT NOTE - Follow Up Consult  Pharmacy Consult for heparin Indication: CAD awaiting CABG  Labs:  Recent Labs  10/21/16 0840 10/22/16 0406 10/23/16 0723 10/23/16 1306 10/24/16 0153  HGB  --   --  11.1*  --  10.4*  HCT  --   --  35.7*  --  33.8*  PLT  --   --  196  --  214  HEPARINUNFRC  --   --  0.41 0.33 0.25*  CREATININE  --  0.87 0.74  --   --   TROPONINI <0.03  --   --   --   --     Assessment: 50yo female subtherapeutic on heparin after two levels at goal though had been trending down and was at low end of goal.  Goal of Therapy:  Heparin level 0.3-0.7 units/ml   Plan:  Will increase heparin gtt by 1-2 units/kg/hr to 1100 units/hr and check level in 6hr.  Vernard Gambles, PharmD, BCPS  10/24/2016,4:05 AM

## 2016-10-24 NOTE — Progress Notes (Signed)
ANTICOAGULATION CONSULT NOTE - Follow Up Consult  Pharmacy Consult for heparin Indication: CAD awaiting CABG  Labs:  Recent Labs  10/22/16 0406  10/23/16 0723 10/23/16 1306 10/24/16 0153 10/24/16 1022  HGB  --   --  11.1*  --  10.4*  --   HCT  --   --  35.7*  --  33.8*  --   PLT  --   --  196  --  214  --   HEPARINUNFRC  --   < > 0.41 0.33 0.25* 0.50  CREATININE 0.87  --  0.74  --   --   --   < > = values in this interval not displayed.  Assessment: 51yo female waiting for CABG this Friday. Heparin level was subtherapeutic overnight, and rate adjusted. Now therapeutic at increased rate of 1100 units/hr. Hemoglobin is a little low today, but no bleeding noted.   Goal of Therapy:  Heparin level 0.3-0.7 units/ml   Plan:  Will continue heparin gtt at 1100 units/hr Follow up CBC, heparin level daily while on heparin    Chales Abrahams, PharmD Candidate  10/24/2016,12:11 PM

## 2016-10-24 NOTE — Progress Notes (Signed)
Progress Note  Patient Name: Carolyn Gaines Date of Encounter: 10/24/2016  Primary Cardiologist: Dr Wyline Mood  Subjective   Chest pain has improved. She complains of headache. Diffuse body pains noted. Overall feeling a little better today.  Inpatient Medications    Scheduled Meds: . aspirin EC  81 mg Oral Daily  . carvedilol  3.125 mg Oral BID WC  . cetirizine HCl  10 mg Oral Daily  . dextromethorphan-guaiFENesin  1 tablet Oral BID  . diclofenac sodium  2 g Topical QID  . escitalopram  20 mg Oral Daily  . fluticasone  2 spray Each Nare Daily  . oxymetazoline  2 spray Each Nare BID  . rosuvastatin  40 mg Oral q1800   Continuous Infusions: . sodium chloride 75 mL/hr at 10/23/16 0110  . heparin 1,100 Units/hr (10/24/16 0404)   PRN Meds: sodium chloride, acetaminophen, acetaminophen, albuterol, diazepam, docusate sodium, morphine injection, nitroGLYCERIN, ondansetron (ZOFRAN) IV, ondansetron (ZOFRAN) IV, oxyCODONE, senna, sodium chloride flush   Vital Signs    Vitals:   10/24/16 0500 10/24/16 0600 10/24/16 0700 10/24/16 0932  BP: 101/67 92/63 91/61  106/62  Pulse: (!) 55 (!) 55 (!) 55 69  Resp: 11 11 11    Temp:   98.7 F (37.1 C)   TempSrc:   Oral   SpO2: 96% 96% 96%   Weight:      Height:        Intake/Output Summary (Last 24 hours) at 10/24/16 0950 Last data filed at 10/24/16 0700  Gross per 24 hour  Intake             1481 ml  Output             1800 ml  Net             -319 ml   Filed Weights   10/21/16 0500 10/22/16 0521 10/22/16 1830  Weight: 174 lb 4.8 oz (79.1 kg) 172 lb 14.4 oz (78.4 kg) 181 lb 10.5 oz (82.4 kg)    Telemetry    Normal sinus rhythm with heart rate in the 70s - Personally Reviewed   Physical Exam  No acute distress, alert and oriented GEN: No acute distress.   Neck: No JVD, No carotid bruit Cardiac: RRR, no murmur appreciable Respiratory: Clear to auscultation bilaterally. GI: Soft, non-distended MS: No edema; No  deformity. Neuro:   moves all extremities normally, no focal weakness Psych: Normal affect   Labs    Chemistry  Recent Labs Lab 10/20/16 1400 10/22/16 0406 10/23/16 0723  NA 139 139 139  K 3.6 3.9 4.4  CL 109 109 113*  CO2 24 23 19*  GLUCOSE 79 85 82  BUN 16 19 13   CREATININE 0.82 0.87 0.74  CALCIUM 8.2* 8.2* 8.2*  GFRNONAA >60 >60 >60  GFRAA >60 >60 >60  ANIONGAP 6 7 7      Hematology  Recent Labs Lab 10/20/16 1400 10/23/16 0723 10/24/16 0153  WBC 9.0 5.8 6.9  RBC 3.81* 3.88 3.73*  HGB 11.0* 11.1* 10.4*  HCT 34.0* 35.7* 33.8*  MCV 89.2 92.0 90.6  MCH 28.9 28.6 27.9  MCHC 32.4 31.1 30.8  RDW 15.0 15.4 15.1  PLT 269 196 214    Cardiac Enzymes  Recent Labs Lab 10/20/16 2024 10/20/16 2256 10/21/16 0142 10/21/16 0840  TROPONINI <0.03 <0.03 <0.03 <0.03     Recent Labs Lab 10/20/16 1403  TROPIPOC 0.00     BNPNo results for input(s): BNP, PROBNP in the last 168 hours.  DDimer   Recent Labs Lab 10/20/16 1505  DDIMER 1.67*     Radiology    No results found.  Cardiac Studies   2-D echocardiogram: Study Conclusions  - Left ventricle: The cavity size was normal. Wall thickness was   normal. Systolic function was normal. The estimated ejection   fraction was in the range of 55% to 60%. Wall motion was normal;   there were no regional wall motion abnormalities. Left   ventricular diastolic function parameters were normal. - Aortic valve: Valve area (VTI): 2.47 cm^2. Valve area (Vmax):   2.22 cm^2. Valve area (Vmean): 2.54 cm^2. - Atrial septum: No defect or patent foramen ovale was identified. - Inferior vena cava: The vessel was dilated. The respirophasic   diameter changes were blunted (< 50%), consistent with elevated   central venous pressure.  Cardiac catheterization: Conclusion     Mid LAD lesion, 50 %stenosed.  Ost LM lesion, 85 %stenosed.  Mid RCA-1 lesion, 50 %stenosed.  Mid RCA-2 lesion, 0 %stenosed.  A drug  eluting .  Prox RCA lesion, 20 %stenosed.   Severe native CAD with 80-90% ostial stenosis of the left main coronary artery which did not improve following IC nitroglycerin administration; 50% mid LAD stenoses; normal left circumflex vessel; and patent mid RCA stent with 45-50% smooth eccentric stenosis proximal to the stented segment and 20% proximal irregularity.  Mild  residual LV dysfunction with an ejection fraction of 45-50% with mild mid inferior hypocontractility.  RECOMMENDATION: The patient has been on full dose Brilinta since her DES stent implantation in July 2018.  Surgical consultation was obtained with Dr. Tyrone Sage.  Brilinta will be discontinued.  The patient will be stabilized on IV NTG with plans to reinstitute heparin. Plan for CABG surgery later this week following several days of Brilinta washout.   If the patient becomes unstable, consider emergent left main stenting.     Patient Profile     51 y.o. female with coronary artery disease, previous stenting of the right coronary artery in 2017, presented with chest pain (typical and atypical symptoms), but found to have severe left mainstem disease on angiography now awaiting cardiac surgery  Assessment & Plan    1. Unstable angina: The patient is stable on IV heparin. I am going to discontinue nitroglycerin today because of her headache. Chest pain is improved. Current medications are reviewed and will be continued. She remains on aspirin, a high intensity statin drug, and the beta blocker. She is awaiting Brilinta washout. Reviewed Dr. Sharee Pimple note with plan for CABG at the end of this week.  2. Hyperlipidemia: Treated with Crestor 40 mg daily. Last lipids from July 2017 showed an LDL of 115 mg/dL. Will update a fasting lipid panel tomorrow morning.  3. Fibromyalgia: Per primary service.  Enzo Bi, MD  10/24/2016, 9:50 AM

## 2016-10-24 NOTE — Progress Notes (Signed)
RT note-Patient has placed on several oxygen devices due to sp02. Patient is currently on NRB 100%, ABG done and results PH-7.34/PC0244/[id number]/HC03-24. RN notifed.

## 2016-10-24 NOTE — Progress Notes (Signed)
   Subjective: Mrs. Sieker was seen and evaluated today at bedside. Complains of headache which she attributes to nitro. Continues to complain of chest pressure/soreness. Reported her CP on admission was "burning" like her prior MI.   Objective:  Vital signs in last 24 hours: Vitals:   10/24/16 0400 10/24/16 0500 10/24/16 0600 10/24/16 0700  BP: 102/61 101/67 92/63 91/61   Pulse: (!) 52 (!) 55 (!) 55 (!) 55  Resp: 12 11 11 11   Temp:    98.7 F (37.1 C)  TempSrc:    Oral  SpO2: 97% 96% 96% 96%  Weight:      Height:       General: In no acute distress. Resting comfortably in bed.  HENT: [place]/AT. No conjunctival injection, icterus or ptosis. Less congested. Cardiovascular: Regular rate and rhythm. No murmur or rub appreciated.  Pulmonary: CTA BL, no wheezing. Unlabored breathing.  Extremities: Warm and well perfused. Diffuse MSK tenderness throughout.  Psych: Mood normal and affect was mood congruent. Responds to questions appropriately.   04/16/16 Exercise nuclear stress: 4 minutes, small septal scar with mild peri-infarct ischemia, small to medium sized anteroapical and apical scar without ischemia. LVEF 40%.   LHC 10/22/16: Severe native CAD with 80-90% stenosis of left main coronary. Did not improve after IC nitro. 50% mid LAD stenosis. Patent mid RCA stent but with 50% proximal stenosis.   Assessment/Plan:This is a 51 y/o F with known CAD (s/p DES RCA 01/2016) and prior MI who presents with features of typical and atypical chest pain following lifting a heavy box at work. Troponins negative and EKG without acute changes. LHC with severe native disease requiring CABG. Currently awaiting brilinta wash out.   Principal Problem:   Chest pain Active Problems:   History of acute inferior wall MI   CAD (coronary artery disease), native coronary artery   Depression with anxiety   Essential hypertension   Fibromyalgia   Unstable angina (HCC)  Unstable Angina, Native vessel CAD with 80-90%  stenosis of left main coronary with stenosis proximal to RCA stent: Per LHC. She requires CABG however currently allowing brilenta wash out. Reviewed Dr. Garen Grams note, plans for CABG Friday.  -Continue to appreciate cardiology and CVTS recommendations -ASA 81 mg daily -Coreg 3.125 mg  -heparin -crestor 40mg  -Nitro d/c due to complaints of headache  Cough and Congestion: Improved w/ conservative therapy, will be continued.  -Cetirizne -Afrin -Flonase -Mucinex DM  Hypertension, HLD: BP since admission soft. Coreg and crestor as above. Cards to get FLP tomorrow AM.  Fibromyalgia: In flare per patient. Continues to complain of diffuse body pain described as burning and pressure like throughout. -Voltaren gel QID -Toradol PRN -K-pad prn  Depression and Anxiety: Continue Lexapro 20 mg daily. Valium prn.  Dispo: Anticipated discharge unclear, ~ 1week.   Carolyn Serafin, DO 10/24/2016, 7:37 AM Pager: [phone]

## 2016-10-24 NOTE — Progress Notes (Deleted)
Benefit check sent on Brilinta

## 2016-10-25 DIAGNOSIS — I10 Essential (primary) hypertension: Secondary | ICD-10-CM

## 2016-10-25 DIAGNOSIS — F418 Other specified anxiety disorders: Secondary | ICD-10-CM

## 2016-10-25 DIAGNOSIS — I252 Old myocardial infarction: Secondary | ICD-10-CM

## 2016-10-25 DIAGNOSIS — M797 Fibromyalgia: Secondary | ICD-10-CM

## 2016-10-25 DIAGNOSIS — I2510 Atherosclerotic heart disease of native coronary artery without angina pectoris: Secondary | ICD-10-CM

## 2016-10-25 DIAGNOSIS — E785 Hyperlipidemia, unspecified: Secondary | ICD-10-CM

## 2016-10-25 DIAGNOSIS — Z7901 Long term (current) use of anticoagulants: Secondary | ICD-10-CM

## 2016-10-25 DIAGNOSIS — Z79899 Other long term (current) drug therapy: Secondary | ICD-10-CM

## 2016-10-25 DIAGNOSIS — J069 Acute upper respiratory infection, unspecified: Secondary | ICD-10-CM

## 2016-10-25 LAB — PLATELET INHIBITION P2Y12: Platelet Function  P2Y12: 259 [PRU] (ref 194–418)

## 2016-10-25 LAB — CBC
HCT: 34.6 % — ABNORMAL LOW (ref 36.0–46.0)
Hemoglobin: 11 g/dL — ABNORMAL LOW (ref 12.0–15.0)
MCH: 29 pg (ref 26.0–34.0)
MCHC: 31.8 g/dL (ref 30.0–36.0)
MCV: 91.3 fL (ref 78.0–100.0)
PLATELETS: 209 10*3/uL (ref 150–400)
RBC: 3.79 MIL/uL — ABNORMAL LOW (ref 3.87–5.11)
RDW: 15.3 % (ref 11.5–15.5)
WBC: 8 10*3/uL (ref 4.0–10.5)

## 2016-10-25 LAB — LIPID PANEL
CHOLESTEROL: 119 mg/dL (ref 0–200)
HDL: 45 mg/dL (ref >40)
LDL Cholesterol: 39 mg/dL (ref 0–99)
Total CHOL/HDL Ratio: 2.6 RATIO
Triglycerides: 177 mg/dL — ABNORMAL HIGH (ref <150)
VLDL: 35 mg/dL (ref 0–40)

## 2016-10-25 LAB — HEPARIN LEVEL (UNFRACTIONATED): Heparin Unfractionated: 0.36 IU/mL (ref 0.30–0.70)

## 2016-10-25 MED ORDER — DEXMEDETOMIDINE HCL IN NACL 400 MCG/100ML IV SOLN
0.1000 ug/kg/h | INTRAVENOUS | Status: AC
  Administered 2016-10-26: 0.7 ug/kg/h via INTRAVENOUS
  Filled 2016-10-25 (×2): qty 100

## 2016-10-25 MED ORDER — BISACODYL 5 MG PO TBEC
5.0000 mg | DELAYED_RELEASE_TABLET | Freq: Once | ORAL | Status: DC
  Filled 2016-10-25: qty 1

## 2016-10-25 MED ORDER — TRANEXAMIC ACID (OHS) BOLUS VIA INFUSION
15.0000 mg/kg | INTRAVENOUS | Status: AC
Start: 2016-10-26 — End: 2016-10-26
  Administered 2016-10-26: 1236 mg via INTRAVENOUS
  Filled 2016-10-25: qty 1236

## 2016-10-25 MED ORDER — DEXTROSE 5 % IV SOLN
1.5000 g | INTRAVENOUS | Status: AC
  Administered 2016-10-26: 1.5 g via INTRAVENOUS
  Administered 2016-10-26: .75 g via INTRAVENOUS
  Filled 2016-10-25 (×2): qty 1.5

## 2016-10-25 MED ORDER — NITROGLYCERIN IN D5W 200-5 MCG/ML-% IV SOLN
2.0000 ug/min | INTRAVENOUS | Status: AC
Start: 2016-10-26 — End: 2016-10-26
  Administered 2016-10-26: 16.6 ug/min via INTRAVENOUS
  Filled 2016-10-25: qty 250

## 2016-10-25 MED ORDER — TRANEXAMIC ACID 1000 MG/10ML IV SOLN
1.5000 mg/kg/h | INTRAVENOUS | Status: AC
  Administered 2016-10-26: 1.5 mg/kg/h via INTRAVENOUS
  Filled 2016-10-25 (×2): qty 25

## 2016-10-25 MED ORDER — METOPROLOL TARTRATE 12.5 MG HALF TABLET
12.5000 mg | ORAL_TABLET | Freq: Once | ORAL | Status: AC
  Administered 2016-10-26: 12.5 mg via ORAL
  Filled 2016-10-25: qty 1

## 2016-10-25 MED ORDER — TEMAZEPAM 15 MG PO CAPS: 15.0000 mg | ORAL_CAPSULE | Freq: Once | ORAL | Status: DC | PRN

## 2016-10-25 MED ORDER — TRANEXAMIC ACID (OHS) PUMP PRIME SOLUTION
2.0000 mg/kg | INTRAVENOUS | Status: DC
  Filled 2016-10-25 (×2): qty 1.65

## 2016-10-25 MED ORDER — DIAZEPAM 5 MG PO TABS
5.0000 mg | ORAL_TABLET | Freq: Once | ORAL | Status: AC
  Administered 2016-10-26: 5 mg via ORAL
  Filled 2016-10-25: qty 1

## 2016-10-25 MED ORDER — DOPAMINE-DEXTROSE 3.2-5 MG/ML-% IV SOLN
0.0000 ug/kg/min | INTRAVENOUS | Status: DC
  Filled 2016-10-25: qty 250

## 2016-10-25 MED ORDER — CHLORHEXIDINE GLUCONATE CLOTH 2 % EX PADS
6.0000 | MEDICATED_PAD | Freq: Once | CUTANEOUS | Status: AC
  Administered 2016-10-25: 6 via TOPICAL

## 2016-10-25 MED ORDER — SODIUM CHLORIDE 0.9 % IV SOLN
1250.0000 mg | INTRAVENOUS | Status: AC
  Administered 2016-10-26: 1250 mg via INTRAVENOUS
  Filled 2016-10-25 (×2): qty 1250

## 2016-10-25 MED ORDER — SODIUM CHLORIDE 0.9 % IV SOLN
30.0000 ug/min | INTRAVENOUS | Status: AC
  Administered 2016-10-26: 40 ug/min via INTRAVENOUS
  Filled 2016-10-25 (×2): qty 2

## 2016-10-25 MED ORDER — POTASSIUM CHLORIDE 2 MEQ/ML IV SOLN
80.0000 meq | INTRAVENOUS | Status: DC
  Filled 2016-10-25 (×2): qty 40

## 2016-10-25 MED ORDER — EPINEPHRINE PF 1 MG/ML IJ SOLN
0.0000 ug/min | INTRAVENOUS | Status: DC
  Filled 2016-10-25 (×2): qty 4

## 2016-10-25 MED ORDER — SODIUM CHLORIDE 0.9 % IV SOLN
INTRAVENOUS | Status: AC
  Administered 2016-10-26: .5 [IU]/h via INTRAVENOUS
  Filled 2016-10-25 (×2): qty 2.5

## 2016-10-25 MED ORDER — CHLORHEXIDINE GLUCONATE 0.12 % MT SOLN
15.0000 mL | Freq: Once | OROMUCOSAL | Status: AC
  Administered 2016-10-26: 15 mL via OROMUCOSAL
  Filled 2016-10-25: qty 15

## 2016-10-25 MED ORDER — MAGNESIUM SULFATE 50 % IJ SOLN
40.0000 meq | INTRAMUSCULAR | Status: DC
  Filled 2016-10-25 (×2): qty 10

## 2016-10-25 MED ORDER — SODIUM CHLORIDE 0.9 % IV SOLN
INTRAVENOUS | Status: DC
  Filled 2016-10-25 (×2): qty 30

## 2016-10-25 MED ORDER — PLASMA-LYTE 148 IV SOLN
INTRAVENOUS | Status: AC
  Administered 2016-10-26: 500 mL
  Filled 2016-10-25 (×2): qty 2.5

## 2016-10-25 MED ORDER — DEXTROSE 5 % IV SOLN
750.0000 mg | INTRAVENOUS | Status: DC
  Filled 2016-10-25 (×2): qty 750

## 2016-10-25 NOTE — Care Management Note (Signed)
Case Management Note  Patient Details  Name: MERRY CARLOS MRN: [id number] Date of Birth: 1965-09-29  Subjective/Objective:  POD 3  Left heart cath and coronaryangiography, she has chronic chest wall pain , plan for surgery tomorrow  For left main land multi vessel cad, brilinta washing out. NCM will cont to follow patient progression.                Action/Plan:   Expected Discharge Date:  10/22/16               Expected Discharge Plan:     In-House Referral:     Discharge planning Services  CM Consult  Post Acute Care Choice:    Choice offered to:     DME Arranged:    DME Agency:     HH Arranged:    HH Agency:     Status of Service:  In process, will continue to follow  If discussed at Long Length of Stay Meetings, dates discussed:    Additional Comments:  Leone Haven, RN 10/25/2016, 5:39 PM

## 2016-10-25 NOTE — Progress Notes (Signed)
3 Days Post-Op Procedure(s) (LRB): Left Heart Cath and Coronary Angiography (N/A) Subjective: Still has her chronic chest wall pain which is relieved with pain meds. Anxious but ready for surgery tomorrow.  Objective: Vital signs in last 24 hours: Temp:  [98.1 F (36.7 C)-99.9 F (37.7 C)] 98.5 F (36.9 C) (03/29 0728) Pulse Rate:  [62-85] 71 (03/29 0600) Cardiac Rhythm: Normal sinus rhythm (03/29 0400) Resp:  [9-23] 15 (03/29 0600) BP: (92-119)/(52-74) 96/59 (03/29 0600) SpO2:  [75 %-100 %] 95 % (03/29 0600) FiO2 (%):  [45 %-100 %] 100 % (03/28 1151)  Hemodynamic parameters for last 24 hours:    Intake/Output from previous day: 03/28 0701 - 03/29 0700 In: 1325.3 [P.O.:840; I.V.:485.3] Out: 4050 [Urine:4050] Intake/Output this shift: Total I/O In: -  Out: 1000 [Urine:1000]  General appearance: alert and cooperative Heart: regular rate and rhythm, S1, S2 normal, no murmur, click, rub or gallop Lungs: clear to auscultation bilaterally  Lab Results:  Recent Labs  10/24/16 0153 10/25/16 0440  WBC 6.9 8.0  HGB 10.4* 11.0*  HCT 33.8* 34.6*  PLT 214 209   BMET:  Recent Labs  10/23/16 0723  NA 139  K 4.4  CL 113*  CO2 19*  GLUCOSE 82  BUN 13  CREATININE 0.74  CALCIUM 8.2*    PT/INR: No results for input(s): LABPROT, INR in the last 72 hours. ABG    Component Value Date/Time   PHART 7.341 (L) 10/24/2016 1147   HCO3 23.6 10/24/2016 1147   TCO2 25 10/24/2016 1147   ACIDBASEDEF 2.0 10/24/2016 1147   O2SAT 100.0 10/24/2016 1147   CBG (last 3)  No results for input(s): GLUCAP in the last 72 hours.  Assessment/Plan:  Left main and multi-vessel CAD. Brilinta washing out. P2Y12 is 259 today so plt function should be fine for surgery tomorrow. She has no further questions.  LOS: 4 days    Alleen Borne 10/25/2016

## 2016-10-25 NOTE — Progress Notes (Signed)
Progress Note  Patient Name: Carolyn Gaines Date of Encounter: 10/25/2016  Primary Cardiologist: Dr Wyline Mood  Subjective   Feeling better. Headache is resolved off of IV nitroglycerin. Still with mild constant chest discomfort. A lot of anxiety about having heart surgery tomorrow.  Inpatient Medications    Scheduled Meds: . aspirin EC  81 mg Oral Daily  . carvedilol  3.125 mg Oral BID WC  . cetirizine HCl  10 mg Oral Daily  . dextromethorphan-guaiFENesin  1 tablet Oral BID  . diclofenac sodium  2 g Topical QID  . escitalopram  20 mg Oral Daily  . fluticasone  2 spray Each Nare Daily  . oxymetazoline  2 spray Each Nare BID  . rosuvastatin  40 mg Oral q1800   Continuous Infusions: . sodium chloride Stopped (10/24/16 1000)  . heparin 1,100 Units/hr (10/25/16 0726)   PRN Meds: sodium chloride, acetaminophen, acetaminophen, albuterol, diazepam, docusate sodium, morphine injection, nitroGLYCERIN, ondansetron (ZOFRAN) IV, ondansetron (ZOFRAN) IV, oxyCODONE, senna, sodium chloride flush   Vital Signs    Vitals:   10/25/16 0400 10/25/16 0500 10/25/16 0600 10/25/16 0728  BP: (!) 104/59 (!) 102/56 (!) 96/59   Pulse: 74 69 71   Resp: 15 14 15    Temp:    98.5 F (36.9 C)  TempSrc:    Oral  SpO2: 96% 96% 95%   Weight:      Height:        Intake/Output Summary (Last 24 hours) at 10/25/16 0740 Last data filed at 10/25/16 0728  Gross per 24 hour  Intake          1325.25 ml  Output             5050 ml  Net         -3724.75 ml   Filed Weights   10/21/16 0500 10/22/16 0521 10/22/16 1830  Weight: 174 lb 4.8 oz (79.1 kg) 172 lb 14.4 oz (78.4 kg) 181 lb 10.5 oz (82.4 kg)    Telemetry    Normal sinus rhythm - Personally Reviewed   Physical Exam  Alert and oriented, no distress GEN: No acute distress.   Neck: No JVD Cardiac: RRR, no murmurs, rubs, or gallops.  Respiratory: Clear to auscultation bilaterally. GI: Soft, nontender, non-distended  MS: No edema; No  deformity. Neuro:  Nonfocal  Psych: Normal affect   Labs    Chemistry Recent Labs Lab 10/20/16 1400 10/22/16 0406 10/23/16 0723  NA 139 139 139  K 3.6 3.9 4.4  CL 109 109 113*  CO2 24 23 19*  GLUCOSE 79 85 82  BUN 16 19 13   CREATININE 0.82 0.87 0.74  CALCIUM 8.2* 8.2* 8.2*  GFRNONAA >60 >60 >60  GFRAA >60 >60 >60  ANIONGAP 6 7 7      Hematology Recent Labs Lab 10/23/16 0723 10/24/16 0153 10/25/16 0440  WBC 5.8 6.9 8.0  RBC 3.88 3.73* 3.79*  HGB 11.1* 10.4* 11.0*  HCT 35.7* 33.8* 34.6*  MCV 92.0 90.6 91.3  MCH 28.6 27.9 29.0  MCHC 31.1 30.8 31.8  RDW 15.4 15.1 15.3  PLT 196 214 209    Cardiac Enzymes Recent Labs Lab 10/20/16 2024 10/20/16 2256 10/21/16 0142 10/21/16 0840  TROPONINI <0.03 <0.03 <0.03 <0.03    Recent Labs Lab 10/20/16 1403  TROPIPOC 0.00     BNPNo results for input(s): BNP, PROBNP in the last 168 hours.   DDimer  Recent Labs Lab 10/20/16 1505  DDIMER 1.67*     Radiology  Dg Chest Port 1 View  Result Date: 10/24/2016 CLINICAL DATA:  51 year old female shortness of breath today, sudden onset chest pain 4 days ago. Former smoker. Initial encounter. EXAM: PORTABLE CHEST 1 VIEW COMPARISON:  10/20/2016 and earlier. FINDINGS: Portable AP upright view at 1122 hours. Lung volumes are stable and low normal. Stable lung parenchyma. Allowing for portable technique the lungs are clear. No pneumothorax or pleural effusion. Visualized tracheal air column is within normal limits. Normal cardiac size and mediastinal contours. IMPRESSION: No acute cardiopulmonary abnormality. Electronically Signed   By: Odessa Fleming M.D.   On: 10/24/2016 11:31     Patient Profile     51 y.o. female with coronary artery disease, previous stenting of the right coronary artery in 2017, presented with chest pain (typical and atypical symptoms), but found to have severe left mainstem disease on angiography now awaiting cardiac surgery  Assessment & Plan    1.  Unstable angina: NTG discontinued yesterday because of headache. Continues on IV heparin, ASA, beta-blocker. Overall stable pending CABG tomorrow morning.  2. Hyperlipidemia: LDL 39 on high intensity statin drug  3. Fibromyalgia: per primary service.   Disposition: CABG tomorrow  Signed, Tonny Bollman, MD  10/25/2016, 7:40 AM

## 2016-10-25 NOTE — Progress Notes (Signed)
   Subjective: [name] was seen and evaluated today at bedside. She continues to have some soreness and CP today however improved since admission. Family present at bedside. Hypoxia resolved quickly yesterday and pt remains without SOB.   Objective:  Vital signs in last 24 hours: Vitals:   10/25/16 0400 10/25/16 0500 10/25/16 0600 10/25/16 0728  BP: (!) 104/59 (!) 102/56 (!) 96/59   Pulse: 74 69 71   Resp: 15 14 15    Temp:    98.5 F (36.9 C)  TempSrc:    Oral  SpO2: 96% 96% 95%   Weight:      Height:       General: In no acute distress. Resting comfortably in bed.  HENT: [place]/AT. No conjunctival injection, icterus or ptosis. Less congested. Cardiovascular: Regular rate and rhythm. No murmur or rub appreciated.  Pulmonary: CTA BL, no wheezing. Unlabored breathing.  Extremities: Warm and well perfused. Diffuse MSK tenderness throughout but improved.  Psych: Mood normal and affect was mood congruent. Responds to questions appropriately.   04/16/16 Exercise nuclear stress: 4 minutes, small septal scar with mild peri-infarct ischemia, small to medium sized anteroapical and apical scar without ischemia. LVEF 40%.   LHC 10/22/16: Severe native CAD with 80-90% stenosis of left main coronary. Did not improve after IC nitro. 50% mid LAD stenosis. Patent mid RCA stent but with 50% proximal stenosis.   Assessment/Plan:This is a 51 y/o F with known CAD (s/p DES RCA 01/2016) and prior MI who presents with features of typical and atypical chest pain following lifting a heavy box at work. Troponins negative and EKG without acute changes. LHC with severe native disease requiring CABG. Currently awaiting brilinta wash out.   Principal Problem:   Precordial chest pain Active Problems:   History of acute inferior wall MI   Coronary artery disease involving native heart without angina pectoris   Depression with anxiety   Essential hypertension   Fibromyalgia   Unstable angina (HCC)  Unstable  Angina, Native vessel CAD with 80-90% stenosis of left main coronary with stenosis proximal to RCA stent: Per LHC. She requires CABG however currently allowing brilenta wash out. Pt has normal platelet function today -- plan on CABG tomorrow morning.  -Continue to appreciate cardiology and CVTS recommendations -ASA 81 mg daily -Coreg 3.125 mg  -heparin -crestor 40mg   Cough and Congestion: Improved w/ conservative therapy, will be continued.  -Cetirizne -Afrin -Flonase -Mucinex DM  Hypertension, HLD: FLP with LDL of 39!   Fibromyalgia: In flare. -Voltaren gel QID -Toradol PRN -K-pad prn  Depression and Anxiety: Continue Lexapro 20 mg daily. Valium prn.  Dispo: Anticipated discharge unclear, CABG tomorrow.   Carolyn Gayler, DO 10/25/2016, 11:23 AM Pager: [phone]

## 2016-10-25 NOTE — Progress Notes (Signed)
  Date: 10/25/2016  Patient name: Carolyn Gaines  Medical record number: [id number]  Date of birth: 05-Jan-1966   This patient's plan of care was discussed with the house staff. Please see their note for complete details. I concur with their findings.  Patients hypoxia resolved yesterday. She still has some sorreness and angina today. Her platelet function is now at level where she can proceed with surgery which will happen tomorrow  On exam she has difficulty taking full breaths due to the chest pain but lungs fairly clear throughout. CV without mgr  Greatly appreciate CVTS and Cardiology's involvement and have wished her luck tomorrow in OR.    Randall Hiss, MD 10/25/2016, 10:55 AM

## 2016-10-25 NOTE — Progress Notes (Signed)
ANTICOAGULATION CONSULT NOTE - Follow Up Consult  Pharmacy Consult for heparin Indication: CAD awaiting CABG  Labs:  Recent Labs  10/23/16 0723  10/24/16 0153 10/24/16 1022 10/25/16 0440  HGB 11.1*  --  10.4*  --  11.0*  HCT 35.7*  --  33.8*  --  34.6*  PLT 196  --  214  --  209  HEPARINUNFRC 0.41  < > 0.25* 0.50 0.36  CREATININE 0.74  --   --   --   --   < > = values in this interval not displayed.  Assessment: 51yo female waiting for CABG this Friday. Heparin level is therapeutic at lower end. CBC stable. No bleeding noted.   Goal of Therapy:  Heparin level 0.3-0.7 units/ml   Plan:  Will continue heparin gtt at 1100 units/hr Will follow up heparin level and CBC while on heparin   Carolyn Gaines, PharmD Candidate  10/25/2016,11:26 AM

## 2016-10-26 ENCOUNTER — Inpatient Hospital Stay (HOSPITAL_COMMUNITY): Payer: BLUE CROSS/BLUE SHIELD

## 2016-10-26 ENCOUNTER — Inpatient Hospital Stay (HOSPITAL_COMMUNITY): Payer: BLUE CROSS/BLUE SHIELD | Admitting: Anesthesiology

## 2016-10-26 ENCOUNTER — Encounter (HOSPITAL_COMMUNITY)
Admission: EM | Disposition: A | Discharge status: Home / Self Care | Payer: Self-pay | Source: Home / Self Care | Attending: Surgery

## 2016-10-26 DIAGNOSIS — Z951 Presence of aortocoronary bypass graft: Secondary | ICD-10-CM

## 2016-10-26 DIAGNOSIS — I2510 Atherosclerotic heart disease of native coronary artery without angina pectoris: Secondary | ICD-10-CM

## 2016-10-26 HISTORY — PX: CORONARY ARTERY BYPASS GRAFT: SHX141

## 2016-10-26 HISTORY — PX: TEE WITHOUT CARDIOVERSION: SHX5443

## 2016-10-26 LAB — CBC
HCT: 35.5 % — ABNORMAL LOW (ref 36.0–46.0)
HCT: 27.3 % — ABNORMAL LOW (ref 36.0–46.0)
HCT: 31.3 % — ABNORMAL LOW (ref 36.0–46.0)
HEMOGLOBIN: 9.6 g/dL — AB (ref 12.0–15.0)
Hemoglobin: 11.2 g/dL — ABNORMAL LOW (ref 12.0–15.0)
Hemoglobin: 8.6 g/dL — ABNORMAL LOW (ref 12.0–15.0)
MCH: 28.8 pg (ref 26.0–34.0)
MCH: 28.5 pg (ref 26.0–34.0)
MCH: 27.7 pg (ref 26.0–34.0)
MCHC: 31.5 g/dL (ref 30.0–36.0)
MCHC: 31.5 g/dL (ref 30.0–36.0)
MCHC: 30.7 g/dL (ref 30.0–36.0)
MCV: 91.3 fL (ref 78.0–100.0)
MCV: 90.4 fL (ref 78.0–100.0)
MCV: 90.5 fL (ref 78.0–100.0)
PLATELETS: 215 10*3/uL (ref 150–400)
PLATELETS: 175 10*3/uL (ref 150–400)
Platelets: 134 10*3/uL — ABNORMAL LOW (ref 150–400)
RBC: 3.89 MIL/uL (ref 3.87–5.11)
RBC: 3.02 MIL/uL — ABNORMAL LOW (ref 3.87–5.11)
RBC: 3.46 MIL/uL — AB (ref 3.87–5.11)
RDW: 15.5 % (ref 11.5–15.5)
RDW: 15.4 % (ref 11.5–15.5)
RDW: 15.1 % (ref 11.5–15.5)
WBC: 7.5 10*3/uL (ref 4.0–10.5)
WBC: 7.8 10*3/uL (ref 4.0–10.5)
WBC: 10 10*3/uL (ref 4.0–10.5)

## 2016-10-26 LAB — POCT I-STAT 3, ART BLOOD GAS (G3+)
ACID-BASE DEFICIT: 1 mmol/L (ref 0.0–2.0)
ACID-BASE EXCESS: 1 mmol/L (ref 0.0–2.0)
Acid-Base Excess: 2 mmol/L (ref 0.0–2.0)
Acid-Base Excess: 1 mmol/L (ref 0.0–2.0)
Acid-base deficit: 1 mmol/L (ref 0.0–2.0)
BICARBONATE: 26.2 mmol/L (ref 20.0–28.0)
BICARBONATE: 25 mmol/L (ref 20.0–28.0)
Bicarbonate: 27 mmol/L (ref 20.0–28.0)
Bicarbonate: 25.3 mmol/L (ref 20.0–28.0)
Bicarbonate: 24.5 mmol/L (ref 20.0–28.0)
O2 SAT: 98 %
O2 Saturation: 100 %
O2 Saturation: 100 %
O2 Saturation: 94 %
O2 Saturation: 98 %
PCO2 ART: 41 mmHg (ref 32.0–48.0)
PCO2 ART: 37.7 mmHg (ref 32.0–48.0)
PH ART: 7.427 (ref 7.350–7.450)
PO2 ART: 123 mmHg — AB (ref 83.0–108.0)
Patient temperature: 37.6
Patient temperature: 37.6
TCO2: 28 mmol/L (ref 0–100)
TCO2: 26 mmol/L (ref 0–100)
TCO2: 28 mmol/L (ref 0–100)
TCO2: 26 mmol/L (ref 0–100)
TCO2: 26 mmol/L (ref 0–100)
pCO2 arterial: 43.3 mmHg (ref 32.0–48.0)
pCO2 arterial: 47.4 mmHg (ref 32.0–48.0)
pCO2 arterial: 47.9 mmHg (ref 32.0–48.0)
pH, Arterial: 7.435 (ref 7.350–7.450)
pH, Arterial: 7.385 (ref 7.350–7.450)
pH, Arterial: 7.325 — ABNORMAL LOW (ref 7.350–7.450)
pH, Arterial: 7.327 — ABNORMAL LOW (ref 7.350–7.450)
pO2, Arterial: 444 mmHg — ABNORMAL HIGH (ref 83.0–108.0)
pO2, Arterial: 237 mmHg — ABNORMAL HIGH (ref 83.0–108.0)
pO2, Arterial: 71 mmHg — ABNORMAL LOW (ref 83.0–108.0)
pO2, Arterial: 108 mmHg (ref 83.0–108.0)

## 2016-10-26 LAB — TYPE AND SCREEN
ABO/RH(D): O POS
ANTIBODY SCREEN: NEGATIVE

## 2016-10-26 LAB — POCT I-STAT, CHEM 8
BUN: 6 mg/dL (ref 6–20)
BUN: 5 mg/dL — ABNORMAL LOW (ref 6–20)
BUN: 6 mg/dL (ref 6–20)
BUN: 5 mg/dL — AB (ref 6–20)
BUN: 5 mg/dL — ABNORMAL LOW (ref 6–20)
BUN: 6 mg/dL (ref 6–20)
CALCIUM ION: 1.05 mmol/L — AB (ref 1.15–1.40)
CALCIUM ION: 1.18 mmol/L (ref 1.15–1.40)
CHLORIDE: 107 mmol/L (ref 101–111)
CHLORIDE: 109 mmol/L (ref 101–111)
CREATININE: 0.6 mg/dL (ref 0.44–1.00)
CREATININE: 0.6 mg/dL (ref 0.44–1.00)
Calcium, Ion: 1.28 mmol/L (ref 1.15–1.40)
Calcium, Ion: 1.29 mmol/L (ref 1.15–1.40)
Calcium, Ion: 1.1 mmol/L — ABNORMAL LOW (ref 1.15–1.40)
Calcium, Ion: 1.13 mmol/L — ABNORMAL LOW (ref 1.15–1.40)
Chloride: 105 mmol/L (ref 101–111)
Chloride: 104 mmol/L (ref 101–111)
Chloride: 107 mmol/L (ref 101–111)
Chloride: 107 mmol/L (ref 101–111)
Creatinine, Ser: 0.5 mg/dL (ref 0.44–1.00)
Creatinine, Ser: 0.6 mg/dL (ref 0.44–1.00)
Creatinine, Ser: 0.4 mg/dL — ABNORMAL LOW (ref 0.44–1.00)
Creatinine, Ser: 0.6 mg/dL (ref 0.44–1.00)
GLUCOSE: 88 mg/dL (ref 65–99)
Glucose, Bld: 86 mg/dL (ref 65–99)
Glucose, Bld: 90 mg/dL (ref 65–99)
Glucose, Bld: 110 mg/dL — ABNORMAL HIGH (ref 65–99)
Glucose, Bld: 99 mg/dL (ref 65–99)
Glucose, Bld: 150 mg/dL — ABNORMAL HIGH (ref 65–99)
HCT: 24 % — ABNORMAL LOW (ref 36.0–46.0)
HCT: 29 % — ABNORMAL LOW (ref 36.0–46.0)
HCT: 26 % — ABNORMAL LOW (ref 36.0–46.0)
HCT: 29 % — ABNORMAL LOW (ref 36.0–46.0)
HEMATOCRIT: 32 % — AB (ref 36.0–46.0)
HEMATOCRIT: 26 % — AB (ref 36.0–46.0)
HEMOGLOBIN: 10.9 g/dL — AB (ref 12.0–15.0)
HEMOGLOBIN: 9.9 g/dL — AB (ref 12.0–15.0)
Hemoglobin: 8.2 g/dL — ABNORMAL LOW (ref 12.0–15.0)
Hemoglobin: 8.8 g/dL — ABNORMAL LOW (ref 12.0–15.0)
Hemoglobin: 8.8 g/dL — ABNORMAL LOW (ref 12.0–15.0)
Hemoglobin: 9.9 g/dL — ABNORMAL LOW (ref 12.0–15.0)
POTASSIUM: 3.9 mmol/L (ref 3.5–5.1)
Potassium: 3.8 mmol/L (ref 3.5–5.1)
Potassium: 5 mmol/L (ref 3.5–5.1)
Potassium: 4.6 mmol/L (ref 3.5–5.1)
Potassium: 5.2 mmol/L — ABNORMAL HIGH (ref 3.5–5.1)
Potassium: 4.6 mmol/L (ref 3.5–5.1)
SODIUM: 140 mmol/L (ref 135–145)
SODIUM: 142 mmol/L (ref 135–145)
SODIUM: 140 mmol/L (ref 135–145)
Sodium: 141 mmol/L (ref 135–145)
Sodium: 143 mmol/L (ref 135–145)
Sodium: 141 mmol/L (ref 135–145)
TCO2: 27 mmol/L (ref 0–100)
TCO2: 28 mmol/L (ref 0–100)
TCO2: 29 mmol/L (ref 0–100)
TCO2: 25 mmol/L (ref 0–100)
TCO2: 28 mmol/L (ref 0–100)
TCO2: 25 mmol/L (ref 0–100)

## 2016-10-26 LAB — BASIC METABOLIC PANEL
ANION GAP: 10 (ref 5–15)
BUN: 7 mg/dL (ref 6–20)
CALCIUM: 8.9 mg/dL (ref 8.9–10.3)
CO2: 24 mmol/L (ref 22–32)
Chloride: 108 mmol/L (ref 101–111)
Creatinine, Ser: 0.83 mg/dL (ref 0.44–1.00)
Glucose, Bld: 87 mg/dL (ref 65–99)
POTASSIUM: 3.8 mmol/L (ref 3.5–5.1)
SODIUM: 142 mmol/L (ref 135–145)

## 2016-10-26 LAB — GLUCOSE, CAPILLARY
GLUCOSE-CAPILLARY: 143 mg/dL — AB (ref 65–99)
GLUCOSE-CAPILLARY: 150 mg/dL — AB (ref 65–99)
Glucose-Capillary: 122 mg/dL — ABNORMAL HIGH (ref 65–99)
Glucose-Capillary: 112 mg/dL — ABNORMAL HIGH (ref 65–99)
Glucose-Capillary: 124 mg/dL — ABNORMAL HIGH (ref 65–99)
Glucose-Capillary: 140 mg/dL — ABNORMAL HIGH (ref 65–99)
Glucose-Capillary: 132 mg/dL — ABNORMAL HIGH (ref 65–99)
Glucose-Capillary: 106 mg/dL — ABNORMAL HIGH (ref 65–99)

## 2016-10-26 LAB — PROTIME-INR
INR: 1.32
PROTHROMBIN TIME: 16.5 s — AB (ref 11.4–15.2)

## 2016-10-26 LAB — MAGNESIUM: Magnesium: 3.2 mg/dL — ABNORMAL HIGH (ref 1.7–2.4)

## 2016-10-26 LAB — CREATININE, SERUM
CREATININE: 0.63 mg/dL (ref 0.44–1.00)
GFR calc Af Amer: >60 mL/min (ref >60)

## 2016-10-26 LAB — HEMOGLOBIN AND HEMATOCRIT, BLOOD
HCT: 26.2 % — ABNORMAL LOW (ref 36.0–46.0)
Hemoglobin: 8.3 g/dL — ABNORMAL LOW (ref 12.0–15.0)

## 2016-10-26 LAB — APTT: aPTT: 29 seconds (ref 24–36)

## 2016-10-26 LAB — HEPARIN LEVEL (UNFRACTIONATED): HEPARIN UNFRACTIONATED: 0.37 [IU]/mL (ref 0.30–0.70)

## 2016-10-26 LAB — PLATELET COUNT: PLATELETS: 163 10*3/uL (ref 150–400)

## 2016-10-26 LAB — SURGICAL PCR SCREEN
MRSA, PCR: NEGATIVE
STAPHYLOCOCCUS AUREUS: NEGATIVE

## 2016-10-26 LAB — ABO/RH: ABO/RH(D): O POS

## 2016-10-26 SURGERY — CORONARY ARTERY BYPASS GRAFTING (CABG)
Anesthesia: General | Site: Chest

## 2016-10-26 MED ORDER — SODIUM CHLORIDE 0.9 % IV SOLN
250.0000 mL | INTRAVENOUS | Status: DC
Start: 2016-10-27 — End: 2016-10-28

## 2016-10-26 MED ORDER — 0.9 % SODIUM CHLORIDE (POUR BTL) OPTIME
TOPICAL | Status: DC | PRN
  Administered 2016-10-26: 6000 mL

## 2016-10-26 MED ORDER — MORPHINE SULFATE (PF) 4 MG/ML IV SOLN
2.0000 mg | INTRAVENOUS | Status: DC | PRN
  Administered 2016-10-26 – 2016-10-27 (×3): 2 mg via INTRAVENOUS
  Administered 2016-10-27 (×3): 4 mg via INTRAVENOUS
  Filled 2016-10-26 (×5): qty 1

## 2016-10-26 MED ORDER — LIDOCAINE 2% (20 MG/ML) 5 ML SYRINGE
INTRAMUSCULAR | Status: DC | PRN
  Administered 2016-10-26: 60 mg via INTRAVENOUS

## 2016-10-26 MED ORDER — ALBUMIN HUMAN 5 % IV SOLN
250.0000 mL | INTRAVENOUS | Status: AC | PRN
  Administered 2016-10-26: 250 mL via INTRAVENOUS

## 2016-10-26 MED ORDER — SODIUM CHLORIDE 0.9% FLUSH
3.0000 mL | Freq: Two times a day (BID) | INTRAVENOUS | Status: DC
Start: 2016-10-27 — End: 2016-10-28
  Administered 2016-10-27: 3 mL via INTRAVENOUS

## 2016-10-26 MED ORDER — SODIUM CHLORIDE 0.9% FLUSH: 3.0000 mL | INTRAVENOUS | Status: DC | PRN

## 2016-10-26 MED ORDER — FENTANYL CITRATE (PF) 250 MCG/5ML IJ SOLN
INTRAMUSCULAR | Status: DC | PRN
  Administered 2016-10-26 (×2): 100 ug via INTRAVENOUS
  Administered 2016-10-26: 150 ug via INTRAVENOUS
  Administered 2016-10-26 (×2): 100 ug via INTRAVENOUS
  Administered 2016-10-26: 150 ug via INTRAVENOUS
  Administered 2016-10-26: 50 ug via INTRAVENOUS
  Administered 2016-10-26 (×6): 100 ug via INTRAVENOUS
  Administered 2016-10-26: 50 ug via INTRAVENOUS
  Administered 2016-10-26: 100 ug via INTRAVENOUS

## 2016-10-26 MED ORDER — PROTAMINE SULFATE 10 MG/ML IV SOLN
INTRAVENOUS | Status: AC
  Filled 2016-10-26: qty 25

## 2016-10-26 MED ORDER — HEMOSTATIC AGENTS (NO CHARGE) OPTIME
TOPICAL | Status: DC | PRN
  Administered 2016-10-26: 1 via TOPICAL

## 2016-10-26 MED ORDER — LIDOCAINE 2% (20 MG/ML) 5 ML SYRINGE
INTRAMUSCULAR | Status: AC
  Filled 2016-10-26: qty 5

## 2016-10-26 MED ORDER — ROCURONIUM BROMIDE 10 MG/ML (PF) SYRINGE
PREFILLED_SYRINGE | INTRAVENOUS | Status: DC | PRN
  Administered 2016-10-26 (×3): 50 mg via INTRAVENOUS

## 2016-10-26 MED ORDER — ONDANSETRON HCL 4 MG/2ML IJ SOLN
4.0000 mg | Freq: Four times a day (QID) | INTRAMUSCULAR | Status: DC | PRN
Start: 2016-10-26 — End: 2016-10-28

## 2016-10-26 MED ORDER — SODIUM CHLORIDE 0.9 % IV SOLN
INTRAVENOUS | Status: DC
  Administered 2016-10-26: 20 mL/h via INTRAVENOUS

## 2016-10-26 MED ORDER — INSULIN REGULAR BOLUS VIA INFUSION
0.0000 [IU] | Freq: Three times a day (TID) | INTRAVENOUS | Status: DC
  Filled 2016-10-26: qty 10

## 2016-10-26 MED ORDER — ACETAMINOPHEN 650 MG RE SUPP
650.0000 mg | Freq: Once | RECTAL | Status: AC
  Administered 2016-10-26: 650 mg via RECTAL

## 2016-10-26 MED ORDER — PANTOPRAZOLE SODIUM 40 MG PO TBEC: 40.0000 mg | DELAYED_RELEASE_TABLET | Freq: Every day | ORAL | Status: DC

## 2016-10-26 MED ORDER — ACETAMINOPHEN 160 MG/5ML PO SOLN: 650.0000 mg | Freq: Once | ORAL | Status: AC

## 2016-10-26 MED ORDER — TRAMADOL HCL 50 MG PO TABS: 50.0000 mg | ORAL_TABLET | ORAL | Status: DC | PRN

## 2016-10-26 MED ORDER — FAMOTIDINE IN NACL 20-0.9 MG/50ML-% IV SOLN
20.0000 mg | Freq: Two times a day (BID) | INTRAVENOUS | Status: AC
  Administered 2016-10-26: 20 mg via INTRAVENOUS

## 2016-10-26 MED ORDER — MIDAZOLAM HCL 5 MG/5ML IJ SOLN
INTRAMUSCULAR | Status: DC | PRN
  Administered 2016-10-26: 4 mg via INTRAVENOUS
  Administered 2016-10-26: 2 mg via INTRAVENOUS
  Administered 2016-10-26: 4 mg via INTRAVENOUS

## 2016-10-26 MED ORDER — THROMBIN 20000 UNITS EX KIT
PACK | CUTANEOUS | Status: AC
  Filled 2016-10-26: qty 1

## 2016-10-26 MED ORDER — MIDAZOLAM HCL 10 MG/2ML IJ SOLN
INTRAMUSCULAR | Status: AC
  Filled 2016-10-26: qty 2

## 2016-10-26 MED ORDER — LACTATED RINGERS IV SOLN: 500.0000 mL | Freq: Once | INTRAVENOUS | Status: DC | PRN

## 2016-10-26 MED ORDER — HEPARIN SODIUM (PORCINE) 1000 UNIT/ML IJ SOLN
INTRAMUSCULAR | Status: AC
  Filled 2016-10-26: qty 1

## 2016-10-26 MED ORDER — MIDAZOLAM HCL 2 MG/2ML IJ SOLN: 2.0000 mg | INTRAMUSCULAR | Status: DC | PRN

## 2016-10-26 MED ORDER — THROMBIN 20000 UNITS EX SOLR
CUTANEOUS | Status: DC | PRN
  Administered 2016-10-26: 20000 [IU] via TOPICAL

## 2016-10-26 MED ORDER — SODIUM CHLORIDE 0.9 % IV SOLN
INTRAVENOUS | Status: DC
  Filled 2016-10-26: qty 2.5

## 2016-10-26 MED ORDER — ARTIFICIAL TEARS OP OINT
TOPICAL_OINTMENT | OPHTHALMIC | Status: DC | PRN
  Administered 2016-10-26: 1 via OPHTHALMIC

## 2016-10-26 MED ORDER — LACTATED RINGERS IV SOLN: INTRAVENOUS | Status: DC

## 2016-10-26 MED ORDER — METOPROLOL TARTRATE 25 MG/10 ML ORAL SUSPENSION: 12.5000 mg | Freq: Two times a day (BID) | ORAL | Status: DC

## 2016-10-26 MED ORDER — HEPARIN SODIUM (PORCINE) 1000 UNIT/ML IJ SOLN
INTRAMUSCULAR | Status: DC | PRN
  Administered 2016-10-26: 32000 [IU] via INTRAVENOUS

## 2016-10-26 MED ORDER — BISACODYL 10 MG RE SUPP: 10.0000 mg | Freq: Every day | RECTAL | Status: DC

## 2016-10-26 MED ORDER — ARTIFICIAL TEARS OP OINT
TOPICAL_OINTMENT | OPHTHALMIC | Status: AC
  Filled 2016-10-26: qty 3.5

## 2016-10-26 MED ORDER — DOCUSATE SODIUM 100 MG PO CAPS
200.0000 mg | ORAL_CAPSULE | Freq: Every day | ORAL | Status: DC
Start: 2016-10-27 — End: 2016-10-28
  Administered 2016-10-27: 200 mg via ORAL
  Filled 2016-10-26: qty 2

## 2016-10-26 MED ORDER — CHLORHEXIDINE GLUCONATE 0.12% ORAL RINSE (MEDLINE KIT)
15.0000 mL | Freq: Two times a day (BID) | OROMUCOSAL | Status: DC
  Administered 2016-10-26: 15 mL via OROMUCOSAL

## 2016-10-26 MED ORDER — FENTANYL CITRATE (PF) 250 MCG/5ML IJ SOLN
INTRAMUSCULAR | Status: AC
  Filled 2016-10-26: qty 30

## 2016-10-26 MED ORDER — METOPROLOL TARTRATE 12.5 MG HALF TABLET: 12.5000 mg | ORAL_TABLET | Freq: Two times a day (BID) | ORAL | Status: DC

## 2016-10-26 MED ORDER — DEXMEDETOMIDINE HCL IN NACL 200 MCG/50ML IV SOLN: 0.0000 ug/kg/h | INTRAVENOUS | Status: DC

## 2016-10-26 MED ORDER — PROPOFOL 10 MG/ML IV BOLUS
INTRAVENOUS | Status: DC | PRN
  Administered 2016-10-26: 20 mg via INTRAVENOUS
  Administered 2016-10-26: 30 mg via INTRAVENOUS
  Administered 2016-10-26: 80 mg via INTRAVENOUS

## 2016-10-26 MED ORDER — ALBUMIN HUMAN 5 % IV SOLN
INTRAVENOUS | Status: DC | PRN
  Administered 2016-10-26: 11:00:00 via INTRAVENOUS

## 2016-10-26 MED ORDER — LACTATED RINGERS IV SOLN
INTRAVENOUS | Status: DC | PRN
  Administered 2016-10-26 (×3): via INTRAVENOUS

## 2016-10-26 MED ORDER — ROCURONIUM BROMIDE 50 MG/5ML IV SOSY
PREFILLED_SYRINGE | INTRAVENOUS | Status: AC
  Filled 2016-10-26: qty 10

## 2016-10-26 MED ORDER — VANCOMYCIN HCL IN DEXTROSE 1-5 GM/200ML-% IV SOLN
1000.0000 mg | Freq: Once | INTRAVENOUS | Status: AC
  Administered 2016-10-26: 1000 mg via INTRAVENOUS
  Filled 2016-10-26: qty 200

## 2016-10-26 MED ORDER — ASPIRIN EC 325 MG PO TBEC
325.0000 mg | DELAYED_RELEASE_TABLET | Freq: Every day | ORAL | Status: DC
  Administered 2016-10-27: 325 mg via ORAL
  Filled 2016-10-26: qty 1

## 2016-10-26 MED ORDER — SODIUM CHLORIDE 0.45 % IV SOLN
INTRAVENOUS | Status: DC | PRN
  Administered 2016-10-26: 20 mL/h via INTRAVENOUS

## 2016-10-26 MED ORDER — METOPROLOL TARTRATE 5 MG/5ML IV SOLN: 2.5000 mg | INTRAVENOUS | Status: DC | PRN

## 2016-10-26 MED ORDER — ACETAMINOPHEN 160 MG/5ML PO SOLN: 1000.0000 mg | Freq: Four times a day (QID) | ORAL | Status: DC

## 2016-10-26 MED ORDER — PROTAMINE SULFATE 10 MG/ML IV SOLN
INTRAVENOUS | Status: AC
  Filled 2016-10-26: qty 5

## 2016-10-26 MED ORDER — SODIUM CHLORIDE 0.9 % IV SOLN
0.0000 ug/min | INTRAVENOUS | Status: DC
  Filled 2016-10-26: qty 2

## 2016-10-26 MED ORDER — ASPIRIN 81 MG PO CHEW: 324.0000 mg | CHEWABLE_TABLET | Freq: Every day | ORAL | Status: DC

## 2016-10-26 MED ORDER — PROTAMINE SULFATE 10 MG/ML IV SOLN
INTRAVENOUS | Status: DC | PRN
  Administered 2016-10-26: 300 mg via INTRAVENOUS

## 2016-10-26 MED ORDER — PROPOFOL 10 MG/ML IV BOLUS
INTRAVENOUS | Status: AC
  Filled 2016-10-26: qty 20

## 2016-10-26 MED ORDER — DEXTROSE 5 % IV SOLN
1.5000 g | Freq: Two times a day (BID) | INTRAVENOUS | Status: AC
  Administered 2016-10-26 – 2016-10-28 (×4): 1.5 g via INTRAVENOUS
  Filled 2016-10-26 (×5): qty 1.5

## 2016-10-26 MED ORDER — HEPARIN SODIUM (PORCINE) 1000 UNIT/ML IJ SOLN
INTRAMUSCULAR | Status: AC
Start: 2016-10-26 — End: 2016-10-26
  Filled 2016-10-26: qty 1

## 2016-10-26 MED ORDER — ACETAMINOPHEN 500 MG PO TABS
1000.0000 mg | ORAL_TABLET | Freq: Four times a day (QID) | ORAL | Status: DC
  Administered 2016-10-26 – 2016-10-28 (×6): 1000 mg via ORAL
  Filled 2016-10-26 (×6): qty 2

## 2016-10-26 MED ORDER — NITROGLYCERIN IN D5W 200-5 MCG/ML-% IV SOLN: 0.0000 ug/min | INTRAVENOUS | Status: DC

## 2016-10-26 MED ORDER — MORPHINE SULFATE (PF) 4 MG/ML IV SOLN
1.0000 mg | INTRAVENOUS | Status: AC | PRN
  Filled 2016-10-26: qty 1

## 2016-10-26 MED ORDER — THROMBIN 20000 UNITS EX SOLR
OROMUCOSAL | Status: DC | PRN
  Administered 2016-10-26 (×3): 4 mL via TOPICAL

## 2016-10-26 MED ORDER — SODIUM CHLORIDE 0.9 % IV SOLN
30.0000 meq | Freq: Once | INTRAVENOUS | Status: DC
  Filled 2016-10-26: qty 15

## 2016-10-26 MED ORDER — CHLORHEXIDINE GLUCONATE 0.12 % MT SOLN
15.0000 mL | OROMUCOSAL | Status: AC
  Administered 2016-10-26: 15 mL via OROMUCOSAL

## 2016-10-26 MED ORDER — OXYCODONE HCL 5 MG PO TABS
5.0000 mg | ORAL_TABLET | ORAL | Status: DC | PRN
  Administered 2016-10-27 (×3): 10 mg via ORAL
  Filled 2016-10-26 (×4): qty 2

## 2016-10-26 MED ORDER — BISACODYL 5 MG PO TBEC
10.0000 mg | DELAYED_RELEASE_TABLET | Freq: Every day | ORAL | Status: DC
Start: 2016-10-27 — End: 2016-10-28
  Administered 2016-10-27: 10 mg via ORAL
  Filled 2016-10-26: qty 2

## 2016-10-26 MED ORDER — ORAL CARE MOUTH RINSE
15.0000 mL | Freq: Four times a day (QID) | OROMUCOSAL | Status: DC
  Administered 2016-10-26 – 2016-10-27 (×4): 15 mL via OROMUCOSAL

## 2016-10-26 MED ORDER — MAGNESIUM SULFATE 4 GM/100ML IV SOLN
4.0000 g | Freq: Once | INTRAVENOUS | Status: AC
  Administered 2016-10-26: 4 g via INTRAVENOUS
  Filled 2016-10-26: qty 100

## 2016-10-26 MED FILL — Heparin Sodium (Porcine) Inj 1000 Unit/ML: INTRAMUSCULAR | Qty: 20 | Status: AC

## 2016-10-26 MED FILL — Sodium Chloride IV Soln 0.9%: INTRAVENOUS | Qty: 2000 | Status: AC

## 2016-10-26 MED FILL — Electrolyte-R (PH 7.4) Solution: INTRAVENOUS | Qty: 3000 | Status: AC

## 2016-10-26 MED FILL — Mannitol IV Soln 20%: INTRAVENOUS | Qty: 500 | Status: AC

## 2016-10-26 MED FILL — Sodium Bicarbonate IV Soln 8.4%: INTRAVENOUS | Qty: 50 | Status: AC

## 2016-10-26 MED FILL — Magnesium Sulfate Inj 50%: INTRAMUSCULAR | Qty: 10 | Status: AC

## 2016-10-26 MED FILL — Heparin Sodium (Porcine) Inj 1000 Unit/ML: INTRAMUSCULAR | Qty: 30 | Status: AC

## 2016-10-26 MED FILL — Lidocaine HCl IV Inj 20 MG/ML: INTRAVENOUS | Qty: 5 | Status: AC

## 2016-10-26 MED FILL — Potassium Chloride Inj 2 mEq/ML: INTRAVENOUS | Qty: 40 | Status: AC

## 2016-10-26 SURGICAL SUPPLY — 97 items
BAG DECANTER FOR FLEXI CONT (MISCELLANEOUS) ×3 IMPLANT
BANDAGE ACE 4X5 VEL STRL LF (GAUZE/BANDAGES/DRESSINGS) ×3 IMPLANT
BANDAGE ACE 6X5 VEL STRL LF (GAUZE/BANDAGES/DRESSINGS) ×3 IMPLANT
BASKET HEART (ORDER IN 25'S) (MISCELLANEOUS) ×1
BASKET HEART (ORDER IN 25S) (MISCELLANEOUS) ×2 IMPLANT
BLADE STERNUM SYSTEM 6 (BLADE) ×3 IMPLANT
BNDG GAUZE ELAST 4 BULKY (GAUZE/BANDAGES/DRESSINGS) ×3 IMPLANT
CANISTER SUCT 3000ML PPV (MISCELLANEOUS) ×3 IMPLANT
CATH ROBINSON RED A/P 18FR (CATHETERS) ×6 IMPLANT
CATH THORACIC 28FR (CATHETERS) ×3 IMPLANT
CATH THORACIC 36FR (CATHETERS) ×3 IMPLANT
CATH THORACIC 36FR RT ANG (CATHETERS) ×3 IMPLANT
CLIP TI MEDIUM 24 (CLIP) IMPLANT
CLIP TI WIDE RED SMALL 24 (CLIP) ×1 IMPLANT
CRADLE DONUT ADULT HEAD (MISCELLANEOUS) ×3 IMPLANT
DRAPE CARDIOVASCULAR INCISE (DRAPES) ×3
DRAPE SLUSH/WARMER DISC (DRAPES) ×3 IMPLANT
DRAPE SRG 135X102X78XABS (DRAPES) ×2 IMPLANT
DRSG COVADERM 4X14 (GAUZE/BANDAGES/DRESSINGS) ×3 IMPLANT
ELECT CAUTERY BLADE 6.4 (BLADE) ×3 IMPLANT
ELECT REM PT RETURN 9FT ADLT (ELECTROSURGICAL) ×6
ELECTRODE REM PT RTRN 9FT ADLT (ELECTROSURGICAL) ×4 IMPLANT
FELT TEFLON 1X6 (MISCELLANEOUS) ×5 IMPLANT
GAUZE SPONGE 4X4 12PLY STRL (GAUZE/BANDAGES/DRESSINGS) ×6 IMPLANT
GLOVE BIO SURGEON STRL SZ 6 (GLOVE) IMPLANT
GLOVE BIO SURGEON STRL SZ 6.5 (GLOVE) IMPLANT
GLOVE BIO SURGEON STRL SZ7 (GLOVE) IMPLANT
GLOVE BIO SURGEON STRL SZ7.5 (GLOVE) IMPLANT
GLOVE BIOGEL PI IND STRL 6 (GLOVE) IMPLANT
GLOVE BIOGEL PI IND STRL 6.5 (GLOVE) IMPLANT
GLOVE BIOGEL PI IND STRL 7.0 (GLOVE) IMPLANT
GLOVE BIOGEL PI INDICATOR 6 (GLOVE)
GLOVE BIOGEL PI INDICATOR 6.5 (GLOVE)
GLOVE BIOGEL PI INDICATOR 7.0 (GLOVE)
GLOVE EUDERMIC 7 POWDERFREE (GLOVE) ×6 IMPLANT
GLOVE ORTHO TXT STRL SZ7.5 (GLOVE) IMPLANT
GOWN STRL REUS W/ TWL LRG LVL3 (GOWN DISPOSABLE) ×8 IMPLANT
GOWN STRL REUS W/ TWL XL LVL3 (GOWN DISPOSABLE) ×2 IMPLANT
GOWN STRL REUS W/TWL LRG LVL3 (GOWN DISPOSABLE) ×18
GOWN STRL REUS W/TWL XL LVL3 (GOWN DISPOSABLE) ×3
HEMOSTAT POWDER SURGIFOAM 1G (HEMOSTASIS) ×9 IMPLANT
HEMOSTAT SURGICEL 2X14 (HEMOSTASIS) ×3 IMPLANT
INSERT FOGARTY 61MM (MISCELLANEOUS) IMPLANT
INSERT FOGARTY XLG (MISCELLANEOUS) IMPLANT
KIT BASIN OR (CUSTOM PROCEDURE TRAY) ×3 IMPLANT
KIT CATH CPB BARTLE (MISCELLANEOUS) ×3 IMPLANT
KIT ROOM TURNOVER OR (KITS) ×3 IMPLANT
KIT SUCTION CATH 14FR (SUCTIONS) ×3 IMPLANT
KIT VASOVIEW HEMOPRO VH 3000 (KITS) ×3 IMPLANT
NS IRRIG 1000ML POUR BTL (IV SOLUTION) ×16 IMPLANT
PACK OPEN HEART (CUSTOM PROCEDURE TRAY) ×3 IMPLANT
PAD ARMBOARD 7.5X6 YLW CONV (MISCELLANEOUS) ×6 IMPLANT
PAD ELECT DEFIB RADIOL ZOLL (MISCELLANEOUS) ×3 IMPLANT
PENCIL BUTTON HOLSTER BLD 10FT (ELECTRODE) ×3 IMPLANT
PUNCH AORTIC ROTATE 4.0MM (MISCELLANEOUS) IMPLANT
PUNCH AORTIC ROTATE 4.5MM 8IN (MISCELLANEOUS) ×3 IMPLANT
PUNCH AORTIC ROTATE 5MM 8IN (MISCELLANEOUS) IMPLANT
SET CARDIOPLEGIA MPS 5001102 (MISCELLANEOUS) ×1 IMPLANT
SPONGE INTESTINAL PEANUT (DISPOSABLE) IMPLANT
SPONGE LAP 18X18 X RAY DECT (DISPOSABLE) ×1 IMPLANT
SPONGE LAP 4X18 X RAY DECT (DISPOSABLE) ×2 IMPLANT
SUT BONE WAX W31G (SUTURE) ×3 IMPLANT
SUT MNCRL AB 4-0 PS2 18 (SUTURE) IMPLANT
SUT PROLENE 3 0 SH DA (SUTURE) IMPLANT
SUT PROLENE 3 0 SH1 36 (SUTURE) ×3 IMPLANT
SUT PROLENE 4 0 RB 1 (SUTURE)
SUT PROLENE 4 0 SH DA (SUTURE) IMPLANT
SUT PROLENE 4-0 RB1 .5 CRCL 36 (SUTURE) IMPLANT
SUT PROLENE 5 0 C 1 36 (SUTURE) IMPLANT
SUT PROLENE 6 0 C 1 30 (SUTURE) IMPLANT
SUT PROLENE 7 0 BV 1 (SUTURE) IMPLANT
SUT PROLENE 7 0 BV1 MDA (SUTURE) ×3 IMPLANT
SUT PROLENE 8 0 BV175 6 (SUTURE) IMPLANT
SUT SILK  1 MH (SUTURE)
SUT SILK 1 MH (SUTURE) IMPLANT
SUT STEEL 6MS V (SUTURE) ×2 IMPLANT
SUT STEEL STERNAL CCS#1 18IN (SUTURE) IMPLANT
SUT STEEL SZ 6 DBL 3X14 BALL (SUTURE) IMPLANT
SUT VIC AB 1 CTX 36 (SUTURE) ×12
SUT VIC AB 1 CTX36XBRD ANBCTR (SUTURE) ×4 IMPLANT
SUT VIC AB 2-0 CT1 27 (SUTURE) ×3
SUT VIC AB 2-0 CT1 TAPERPNT 27 (SUTURE) IMPLANT
SUT VIC AB 2-0 CTX 27 (SUTURE) IMPLANT
SUT VIC AB 3-0 SH 27 (SUTURE)
SUT VIC AB 3-0 SH 27X BRD (SUTURE) IMPLANT
SUT VIC AB 3-0 X1 27 (SUTURE) ×2 IMPLANT
SUT VICRYL 4-0 PS2 18IN ABS (SUTURE) IMPLANT
SUTURE E-PAK OPEN HEART (SUTURE) ×3 IMPLANT
SYSTEM SAHARA CHEST DRAIN ATS (WOUND CARE) ×3 IMPLANT
TOWEL GREEN STERILE (TOWEL DISPOSABLE) ×9 IMPLANT
TOWEL GREEN STERILE FF (TOWEL DISPOSABLE) ×5 IMPLANT
TOWEL OR 17X24 6PK STRL BLUE (TOWEL DISPOSABLE) ×3 IMPLANT
TOWEL OR 17X26 10 PK STRL BLUE (TOWEL DISPOSABLE) ×3 IMPLANT
TRAY FOLEY IC TEMP SENS 16FR (CATHETERS) ×2 IMPLANT
TUBING INSUFFLATION (TUBING) ×3 IMPLANT
UNDERPAD 30X30 (UNDERPADS AND DIAPERS) ×3 IMPLANT
WATER STERILE IRR 1000ML POUR (IV SOLUTION) ×6 IMPLANT

## 2016-10-26 NOTE — OR Nursing (Signed)
Forty-five minute call to SICU charge nurse at 1107.

## 2016-10-26 NOTE — Transfer of Care (Signed)
Immediate Anesthesia Transfer of Care Note  Patient: Carolyn Gaines  Procedure(s) Performed: Procedure(s) with comments: CORONARY ARTERY BYPASS GRAFTING (CABG), ON PUMP, TIMES THREE, USING LEFT INTERNAL MAMMARY ARTERY AND ENDOSCOPICALLY HARVESTED RIGHT GREATER SAPHENOUS VEIN (N/A) - LIMA-LAD SVG-RCA SVG-OM TRANSESOPHAGEAL ECHOCARDIOGRAM (TEE) (N/A)  Patient Location: SICU  Anesthesia Type:General  Level of Consciousness: sedated, patient cooperative and Patient remains intubated per anesthesia plan  Airway & Oxygen Therapy: Patient remains intubated per anesthesia plan and Patient placed on Ventilator (see vital sign flow sheet for setting)  Post-op Assessment: Report given to RN and Post -op Vital signs reviewed and stable  Post vital signs: Reviewed and stable  Last Vitals:  Vitals:   10/26/16 0622 10/26/16 1217  BP:    Pulse: (!) 59 81  Resp: (!) 9 12  Temp:      Last Pain:  Vitals:   10/26/16 0400  TempSrc: Oral  PainSc:       Patients Stated Pain Goal: 3 (10/26/16 0000)  Complications: No apparent anesthesia complications

## 2016-10-26 NOTE — Progress Notes (Signed)
Rapid wean protocol initiated at 1700.

## 2016-10-26 NOTE — Anesthesia Procedure Notes (Signed)
Central Venous Catheter Insertion Performed by: Arta Bruce, anesthesiologist Start/End3/30/2018 6:30 AM, 10/26/2016 6:45 AM Preanesthetic checklist: patient identified, IV checked, risks and benefits discussed, surgical consent, monitors and equipment checked, pre-op evaluation, timeout performed and anesthesia consent Position: Trendelenburg Lidocaine 1% used for infiltration and patient sedated Hand hygiene performed , maximum sterile barriers used  and Seldinger technique used Central line was placed.Sheath introducer Swan type:thermodilution Procedure performed using ultrasound guided technique. Ultrasound Notes:anatomy identified, needle tip was noted to be adjacent to the nerve/plexus identified, no ultrasound evidence of intravascular and/or intraneural injection and image(s) printed for medical record Attempts: 1 Following insertion, line sutured and dressing applied. Post procedure assessment: blood return through all ports, free fluid flow and no air  Patient tolerated the procedure well with no immediate complications.

## 2016-10-26 NOTE — Anesthesia Preprocedure Evaluation (Signed)
Anesthesia Evaluation  Patient identified by MRN, date of birth, ID band Patient awake    Reviewed: Allergy & Precautions, NPO status , Patient's Chart, lab work & pertinent test results  Airway Mallampati: II  TM Distance: >3 FB     Dental  (+) Edentulous Upper, Edentulous Lower   Pulmonary former smoker,    breath sounds clear to auscultation       Cardiovascular  Rhythm:Regular Rate:Normal     Neuro/Psych    GI/Hepatic   Endo/Other    Renal/GU      Musculoskeletal   Abdominal   Peds  Hematology   Anesthesia Other Findings   Reproductive/Obstetrics                             Anesthesia Physical Anesthesia Plan  ASA: IV  Anesthesia Plan: General   Post-op Pain Management:    Induction: Intravenous  Airway Management Planned: Oral ETT  Additional Equipment: Arterial line, PA Cath and 3D TEE  Intra-op Plan:   Post-operative Plan: Post-operative intubation/ventilation  Informed Consent: I have reviewed the patients History and Physical, chart, labs and discussed the procedure including the risks, benefits and alternatives for the proposed anesthesia with the patient or authorized representative who has indicated his/her understanding and acceptance.     Plan Discussed with: CRNA and Anesthesiologist  Anesthesia Plan Comments:         Anesthesia Quick Evaluation

## 2016-10-26 NOTE — Brief Op Note (Signed)
10/20/2016 - 10/26/2016  10:40 AM  PATIENT:  Carolyn Gaines  51 y.o. female  PRE-OPERATIVE DIAGNOSIS:  CAD LMD  POST-OPERATIVE DIAGNOSIS:  CAD LMD  PROCEDURE:  Procedure(s): CORONARY ARTERY BYPASS GRAFTING (CABG), ON PUMP, TIMES THREE, USING LEFT INTERNAL MAMMARY ARTERY AND ENDOSCOPICALLY HARVESTED RIGHT GREATER SAPHENOUS VEIN (N/A) TRANSESOPHAGEAL ECHOCARDIOGRAM (TEE) (N/A) LIMA-LAD SVG-RCA SVG-OM   SURGEON:  Surgeon(s) and Role:    * Alleen Borne, MD - Primary  PHYSICIAN ASSISTANT: WAYNE GOLD PA-C  ANESTHESIA:   general  EBL:  Total I/O In: 1000 [I.V.:1000] Out: 800 [Urine:800]  BLOOD ADMINISTERED:none  DRAINS: ROUTINE CHEST TUBES   LOCAL MEDICATIONS USED:  NONE  SPECIMEN:  ROUTINE CHEST TUBES  DISPOSITION OF SPECIMEN:  N/A  COUNTS:  YES  TOURNIQUET:  * No tourniquets in log *  DICTATION: .Dragon Dictation  PLAN OF CARE: Admit to inpatient   PATIENT DISPOSITION:  ICU - intubated and hemodynamically stable.   Delay start of Pharmacological VTE agent (>24hrs) due to surgical blood loss or risk of bleeding: yes  COMPLICATIONS: NO KNOWN

## 2016-10-26 NOTE — Progress Notes (Addendum)
  Echocardiogram Echocardiogram Transesophageal has been performed.  Leta Jungling M 10/26/2016, 8:10 AM

## 2016-10-26 NOTE — Op Note (Signed)
CARDIOVASCULAR SURGERY OPERATIVE NOTE  10/26/2016  Surgeon:  Alleen Borne, MD  First Assistant: Gershon Crane,  PA-C   Preoperative Diagnosis:  Severe multi-vessel coronary artery disease   Postoperative Diagnosis:  Same   Procedure:  1. Median Sternotomy 2. Extracorporeal circulation 3.   Coronary artery bypass grafting x 3   Left internal mammary graft to the LAD  SVG to OM  SVG to RCA  4.   Endoscopic vein harvest from the right leg   Anesthesia:  General Endotracheal   Clinical History/Surgical Indication:  The patient is a 51yo woman with PMH of NSTEMI in 01/2016, HTN, previous tobacco use and fibromyalgia who presented with chest pain which started after lifting heavy boxes at work. Cardiac enzymes were negative. She had a stent placed in the RCA in 2017. Cath now shows a 80-90% ostial LM stenosis and 50% mid LAD stenosis. The RCA has 50% eccentric proximal stenosis. LVEF was 45-50% with mild inferior hypokinesis. It was felt that CABG was the best treatment. She was on Brilinta for her previous stent and this was discontinued. Surgery had to wait several days for Brilinta washout. I discussed the operative procedure with the patient and family including alternatives, benefits and risks; including but not limited to bleeding, blood transfusion, infection, stroke, myocardial infarction, graft failure, heart block requiring a permanent pacemaker, organ dysfunction, and death.  [name] understands and agrees to proceed.    Preparation:  The patient was seen in the preoperative holding area and the correct patient, correct operation were confirmed with the patient after reviewing the medical record and catheterization. The consent was signed by me. Preoperative antibiotics were given. A pulmonary arterial line and radial arterial line were placed by the anesthesia team. The  patient was taken back to the operating room and positioned supine on the operating room table. After being placed under general endotracheal anesthesia by the anesthesia team a foley catheter was placed. The neck, chest, abdomen, and both legs were prepped with betadine soap and solution and draped in the usual sterile manner. A surgical time-out was taken and the correct patient and operative procedure were confirmed with the nursing and anesthesia staff.   Cardiopulmonary Bypass:  A median sternotomy was performed. The pericardium was opened in the midline. Right ventricular function appeared normal. The ascending aorta was of normal size and had no palpable plaque. There were no contraindications to aortic cannulation or cross-clamping. The patient was fully systemically heparinized and the ACT was maintained > 400 sec. The proximal aortic arch was cannulated with a 20 F aortic cannula for arterial inflow. Venous cannulation was performed via the right atrial appendage using a two-staged venous cannula. An antegrade cardioplegia/vent cannula was inserted into the mid-ascending aorta. Aortic occlusion was performed with a single cross-clamp. Systemic cooling to 32 degrees Centigrade and topical cooling of the heart with iced saline were used. Hyperkalemic antegrade cold blood cardioplegia was used to induce diastolic arrest and was then given at about 20 minute intervals throughout the period of arrest to maintain myocardial temperature at or below 10 degrees centigrade. A temperature probe was inserted into the interventricular septum and an insulating pad was placed in the pericardium.   Left internal mammary harvest:  The left side of the sternum was retracted using the Rultract retractor. The left internal mammary artery was harvested as a pedicle graft. All side branches were clipped. It was a medium-sized vessel of good quality with excellent blood flow. It was ligated  distally and divided. It was  sprayed with topical papaverine solution to prevent vasospasm.   Endoscopic vein harvest:  The right greater saphenous vein was harvested endoscopically through a 2 cm incision medial to the right knee. It was harvested from the upper thigh to below the knee. It was a medium-sized vein of good quality. The side branches were all ligated with 4-0 silk ties.    Coronary arteries:  The coronary arteries were examined.   LAD:  Large vessel with no distal disease  LCX:  OM is a medium sized vessel with no distal disease.  RCA:  Large vessel with no distal disease   Grafts:  1. LIMA to the LAD: 2.5 mm. It was sewn end to side using 8-0 prolene continuous suture. 2. SVG to OM:  1.6 mm. It was sewn end to side using 7-0 prolene continuous suture. 3. SVG to distal RCA:  3.0 mm. It was sewn end to side using 7-0 prolene continuous suture.   The proximal vein graft anastomoses were performed to the mid-ascending aorta using continuous 6-0 prolene suture. Graft markers were placed around the proximal anastomoses.   Completion:  The patient was rewarmed to 37 degrees Centigrade. The clamp was removed from the LIMA pedicle and there was rapid warming of the septum and return of ventricular fibrillation. The crossclamp was removed with a time of 56 minutes. There was spontaneous return of sinus rhythm. The distal and proximal anastomoses were checked for hemostasis. The position of the grafts was satisfactory. Two temporary epicardial pacing wires were placed on the right atrium and two on the right ventricle. The patient was weaned from CPB without difficulty on no inotropes. CPB time was 74 minutes. Cardiac output was 5 LPM. Heparin was fully reversed with protamine and the aortic and venous cannulas removed. Hemostasis was achieved. Mediastinal and left pleural drainage tubes were placed. The sternum was closed with  #6 stainless steel wires. The fascia was closed with continuous # 1 vicryl  suture. The subcutaneous tissue was closed with 2-0 vicryl continuous suture. The skin was closed with 3-0 vicryl subcuticular suture. All sponge, needle, and instrument counts were reported correct at the end of the case. Dry sterile dressings were placed over the incisions and around the chest tubes which were connected to pleurevac suction. The patient was then transported to the surgical intensive care unit in critical but stable condition.

## 2016-10-26 NOTE — Procedures (Signed)
Extubation Procedure Note  Patient Details:   Name: CORTISHA TRAGER DOB: 19-Mar-1966 MRN: [id number]   Airway Documentation:     Evaluation  O2 sats: stable throughout Complications: No apparent complications Patient did tolerate procedure well. Bilateral Breath Sounds: Clear   Yes   Patient extubated to 4L nasal cannula.  Positive cuff leak noted.  No evidence of stridor.  Patient able to speak post extubation.  NIF of -22, VC of 800.  Incentive spirometry performed x5.  Sats currently 97%.  Vitals are stable.  No complications noted.  Durwin Glaze 10/26/2016, 6:20 PM

## 2016-10-26 NOTE — Anesthesia Procedure Notes (Signed)
Procedure Name: Intubation Date/Time: 10/26/2016 7:42 AM Performed by: Rosiland Oz Pre-anesthesia Checklist: Emergency Drugs available, Patient identified, Suction available, Patient being monitored and Timeout performed Patient Re-evaluated:Patient Re-evaluated prior to inductionOxygen Delivery Method: Circle system utilized Preoxygenation: Pre-oxygenation with 100% oxygen Intubation Type: IV induction Ventilation: Mask ventilation without difficulty and Oral airway inserted - appropriate to patient size Laryngoscope Size: Miller and 3 Grade View: Grade I Tube type: Oral Tube size: 7.5 mm Number of attempts: 1 Airway Equipment and Method: Stylet Placement Confirmation: ETT inserted through vocal cords under direct vision,  positive ETCO2 and breath sounds checked- equal and bilateral Secured at: 21 cm Tube secured with: Tape Dental Injury: Teeth and Oropharynx as per pre-operative assessment

## 2016-10-26 NOTE — OR Nursing (Signed)
Twenty minute call to SICU charge nurse at 1143.

## 2016-10-26 NOTE — Progress Notes (Signed)
Patient ID: Carolyn Gaines, female   DOB: 1966-03-13, 51 y.o.   MRN: [id number]   SICU Evening Rounds:   Hemodynamically stable  CI = 2.1  Extubated, sleepy   Urine output good  CT output low  CBC    Component Value Date/Time   WBC 7.8 10/26/2016 1230   RBC 3.02 (L) 10/26/2016 1230   HGB 9.9 (L) 10/26/2016 1907   HCT 29.0 (L) 10/26/2016 1907   PLT 134 (L) 10/26/2016 1230   MCV 90.4 10/26/2016 1230   MCH 28.5 10/26/2016 1230   MCHC 31.5 10/26/2016 1230   RDW 15.4 10/26/2016 1230   LYMPHSABS 1.0 07/03/2016 1535   MONOABS 0.3 07/03/2016 1535   EOSABS 0.2 07/03/2016 1535   BASOSABS 0.0 07/03/2016 1535     BMET    Component Value Date/Time   NA 140 10/26/2016 1907   K 4.6 10/26/2016 1907   CL 109 10/26/2016 1907   CO2 24 10/26/2016 0424   GLUCOSE 150 (H) 10/26/2016 1907   BUN 6 10/26/2016 1907   CREATININE 0.60 10/26/2016 1907   CALCIUM 8.9 10/26/2016 0424   GFRNONAA >60 10/26/2016 0424   GFRAA >60 10/26/2016 0424     A/P:  Stable postop course. Continue current plans

## 2016-10-27 ENCOUNTER — Inpatient Hospital Stay (HOSPITAL_COMMUNITY): Payer: BLUE CROSS/BLUE SHIELD

## 2016-10-27 LAB — CBC
HCT: 30.3 % — ABNORMAL LOW (ref 36.0–46.0)
HCT: 29.3 % — ABNORMAL LOW (ref 36.0–46.0)
HEMOGLOBIN: 9.6 g/dL — AB (ref 12.0–15.0)
HEMOGLOBIN: 9.2 g/dL — AB (ref 12.0–15.0)
MCH: 28.7 pg (ref 26.0–34.0)
MCH: 28.6 pg (ref 26.0–34.0)
MCHC: 31.7 g/dL (ref 30.0–36.0)
MCHC: 31.4 g/dL (ref 30.0–36.0)
MCV: 90.4 fL (ref 78.0–100.0)
MCV: 91 fL (ref 78.0–100.0)
Platelets: 191 10*3/uL (ref 150–400)
Platelets: 181 10*3/uL (ref 150–400)
RBC: 3.35 MIL/uL — ABNORMAL LOW (ref 3.87–5.11)
RBC: 3.22 MIL/uL — ABNORMAL LOW (ref 3.87–5.11)
RDW: 15.6 % — ABNORMAL HIGH (ref 11.5–15.5)
RDW: 15.5 % (ref 11.5–15.5)
WBC: 10.4 10*3/uL (ref 4.0–10.5)
WBC: 9.3 10*3/uL (ref 4.0–10.5)

## 2016-10-27 LAB — BASIC METABOLIC PANEL
Anion gap: 6 (ref 5–15)
BUN: 7 mg/dL (ref 6–20)
CHLORIDE: 107 mmol/L (ref 101–111)
CO2: 22 mmol/L (ref 22–32)
CREATININE: 0.67 mg/dL (ref 0.44–1.00)
Calcium: 8.1 mg/dL — ABNORMAL LOW (ref 8.9–10.3)
GFR calc Af Amer: >60 mL/min (ref >60)
GFR calc non Af Amer: >60 mL/min (ref >60)
GLUCOSE: 132 mg/dL — AB (ref 65–99)
Potassium: 4.8 mmol/L (ref 3.5–5.1)
SODIUM: 135 mmol/L (ref 135–145)

## 2016-10-27 LAB — POCT I-STAT, CHEM 8
BUN: 11 mg/dL (ref 6–20)
CALCIUM ION: 1.25 mmol/L (ref 1.15–1.40)
CHLORIDE: 103 mmol/L (ref 101–111)
Creatinine, Ser: 0.8 mg/dL (ref 0.44–1.00)
Glucose, Bld: 125 mg/dL — ABNORMAL HIGH (ref 65–99)
HCT: 28 % — ABNORMAL LOW (ref 36.0–46.0)
Hemoglobin: 9.5 g/dL — ABNORMAL LOW (ref 12.0–15.0)
POTASSIUM: 4.2 mmol/L (ref 3.5–5.1)
SODIUM: 138 mmol/L (ref 135–145)
TCO2: 26 mmol/L (ref 0–100)

## 2016-10-27 LAB — GLUCOSE, CAPILLARY
GLUCOSE-CAPILLARY: 124 mg/dL — AB (ref 65–99)
GLUCOSE-CAPILLARY: 122 mg/dL — AB (ref 65–99)
GLUCOSE-CAPILLARY: 113 mg/dL — AB (ref 65–99)
GLUCOSE-CAPILLARY: 121 mg/dL — AB (ref 65–99)
Glucose-Capillary: 103 mg/dL — ABNORMAL HIGH (ref 65–99)
Glucose-Capillary: 108 mg/dL — ABNORMAL HIGH (ref 65–99)
Glucose-Capillary: 115 mg/dL — ABNORMAL HIGH (ref 65–99)
Glucose-Capillary: 125 mg/dL — ABNORMAL HIGH (ref 65–99)
Glucose-Capillary: 96 mg/dL (ref 65–99)

## 2016-10-27 LAB — CREATININE, SERUM
CREATININE: 0.89 mg/dL (ref 0.44–1.00)
GFR calc Af Amer: >60 mL/min (ref >60)
GFR calc non Af Amer: >60 mL/min (ref >60)

## 2016-10-27 LAB — MAGNESIUM
MAGNESIUM: 2.5 mg/dL — AB (ref 1.7–2.4)
Magnesium: 2.1 mg/dL (ref 1.7–2.4)

## 2016-10-27 MED ORDER — INSULIN ASPART 100 UNIT/ML ~~LOC~~ SOLN
0.0000 [IU] | SUBCUTANEOUS | Status: DC
  Administered 2016-10-27 (×2): 2 [IU] via SUBCUTANEOUS

## 2016-10-27 MED ORDER — ENOXAPARIN SODIUM 40 MG/0.4ML ~~LOC~~ SOLN
40.0000 mg | Freq: Every day | SUBCUTANEOUS | Status: DC
  Administered 2016-10-27 – 2016-10-30 (×4): 40 mg via SUBCUTANEOUS
  Filled 2016-10-27 (×4): qty 0.4

## 2016-10-27 MED ORDER — FUROSEMIDE 10 MG/ML IJ SOLN
40.0000 mg | Freq: Once | INTRAMUSCULAR | Status: AC
  Administered 2016-10-27: 40 mg via INTRAVENOUS
  Filled 2016-10-27: qty 4

## 2016-10-27 MED ORDER — KETOROLAC TROMETHAMINE 15 MG/ML IJ SOLN
15.0000 mg | Freq: Four times a day (QID) | INTRAMUSCULAR | Status: DC | PRN
  Administered 2016-10-27 – 2016-10-28 (×4): 15 mg via INTRAVENOUS
  Filled 2016-10-27 (×4): qty 1

## 2016-10-27 MED ORDER — CARVEDILOL 3.125 MG PO TABS
3.1250 mg | ORAL_TABLET | Freq: Two times a day (BID) | ORAL | Status: DC
  Administered 2016-10-27 – 2016-10-31 (×9): 3.125 mg via ORAL
  Filled 2016-10-27 (×9): qty 1

## 2016-10-27 NOTE — Progress Notes (Signed)
1 Day Post-Op Procedure(s) (LRB): CORONARY ARTERY BYPASS GRAFTING (CABG), ON PUMP, TIMES THREE, USING LEFT INTERNAL MAMMARY ARTERY AND ENDOSCOPICALLY HARVESTED RIGHT GREATER SAPHENOUS VEIN (N/A) TRANSESOPHAGEAL ECHOCARDIOGRAM (TEE) (N/A) Subjective:  Complains of pain  Objective: Vital signs in last 24 hours: Temp:  [96.8 F (36 C)-100.4 F (38 C)] 99.9 F (37.7 C) (03/31 0758) Pulse Rate:  [47-89] 85 (03/31 0758) Cardiac Rhythm: Normal sinus rhythm (03/31 0600) Resp:  [11-26] 23 (03/31 0758) BP: (76-116)/(51-84) 115/81 (03/31 0700) SpO2:  [94 %-100 %] 100 % (03/31 0758) Arterial Line BP: (99-145)/(50-88) 134/63 (03/31 0758) FiO2 (%):  [40 %-50 %] 40 % (03/30 1722) Weight:  [79.9 kg (176 lb 2.4 oz)] 79.9 kg (176 lb 2.4 oz) (03/31 0500)  Hemodynamic parameters for last 24 hours: PAP: (16-41)/(9-33) 23/12 CO:  [3.7 L/min-4 L/min] 4 L/min CI:  [2 L/min/m2-2.2 L/min/m2] 2.2 L/min/m2  Intake/Output from previous day: 03/30 0701 - 03/31 0700 In: 4680.5 [I.V.:3542.5; Blood:188; IV Piggyback:950] Out: 5005 [Urine:4115; Blood:600; Chest Tube:290] Intake/Output this shift: No intake/output data recorded.  General appearance: lethargic from pain meds Neurologic: intact Heart: regular rate and rhythm, S1, S2 normal, no murmur, click, rub or gallop Lungs: clear to auscultation bilaterally Extremities: edema mild Wound: dressings dry  Lab Results:  Recent Labs  10/26/16 1934 10/27/16 0327  WBC 10.0 10.4  HGB 9.6* 9.6*  HCT 31.3* 30.3*  PLT 175 191   BMET:  Recent Labs  10/26/16 0424  10/26/16 1907 10/26/16 1934 10/27/16 0327  NA 142  < > 140  --  135  K 3.8  < > 4.6  --  4.8  CL 108  < > 109  --  107  CO2 24  --   --   --  22  GLUCOSE 87  < > 150*  --  132*  BUN 7  < > 6  --  7  CREATININE 0.83  < > 0.60 0.63 0.67  CALCIUM 8.9  --   --   --  8.1*  < > = values in this interval not displayed.  PT/INR:  Recent Labs  10/26/16 1230  LABPROT 16.5*  INR 1.32    ABG    Component Value Date/Time   PHART 7.327 (L) 10/26/2016 1912   HCO3 25.0 10/26/2016 1912   TCO2 26 10/26/2016 1912   ACIDBASEDEF 1.0 10/26/2016 1912   O2SAT 98.0 10/26/2016 1912   CBG (last 3)   Recent Labs  10/27/16 0104 10/27/16 0323 10/27/16 0741  GLUCAP 103* 124* 122*   CXR: ok  ECG: sinus, no acute changes  Assessment/Plan: S/P Procedure(s) (LRB): CORONARY ARTERY BYPASS GRAFTING (CABG), ON PUMP, TIMES THREE, USING LEFT INTERNAL MAMMARY ARTERY AND ENDOSCOPICALLY HARVESTED RIGHT GREATER SAPHENOUS VEIN (N/A) TRANSESOPHAGEAL ECHOCARDIOGRAM (TEE) (N/A)  Hemodynamically stable in sinus rhythm with CI 2.2 on no inotropes Resume low dose Coreg. Add ACE I as BP allows. Continue ASA. Does not need to go back on Brilinta. Mobilize Diuresis Diabetes control: no prior hx and Hgb A1C no checked preop for some reason. Will continue CBG's and SSI for now. d/c tubes/lines Continue foley due to diuresing patient and patient in ICU Toradol for pain See progression orders   LOS: 6 days    Alleen Borne 10/27/2016

## 2016-10-27 NOTE — Progress Notes (Signed)
Patient ID: Carolyn Gaines, female   DOB: 10/03/1965, 51 y.o.   MRN: [id number]  SICU Evening Rounds:  Hemodynamically stable in sinus rhythm.  Diuresing well  Ambulated.   BMET    Component Value Date/Time   NA 138 10/27/2016 1536   K 4.2 10/27/2016 1536   CL 103 10/27/2016 1536   CO2 22 10/27/2016 0327   GLUCOSE 125 (H) 10/27/2016 1536   BUN 11 10/27/2016 1536   CREATININE 0.80 10/27/2016 1536   CALCIUM 8.1 (L) 10/27/2016 0327   GFRNONAA >60 10/27/2016 1530   GFRAA >60 10/27/2016 1530   CBC    Component Value Date/Time   WBC 9.3 10/27/2016 1530   RBC 3.22 (L) 10/27/2016 1530   HGB 9.5 (L) 10/27/2016 1536   HCT 28.0 (L) 10/27/2016 1536   PLT 181 10/27/2016 1530   MCV 91.0 10/27/2016 1530   MCH 28.6 10/27/2016 1530   MCHC 31.4 10/27/2016 1530   RDW 15.5 10/27/2016 1530   LYMPHSABS 1.0 07/03/2016 1535   MONOABS 0.3 07/03/2016 1535   EOSABS 0.2 07/03/2016 1535   BASOSABS 0.0 07/03/2016 1535

## 2016-10-27 NOTE — Anesthesia Postprocedure Evaluation (Addendum)
Anesthesia Post Note  Patient: Carolyn Gaines  Procedure(s) Performed: Procedure(s) (LRB): CORONARY ARTERY BYPASS GRAFTING (CABG), ON PUMP, TIMES THREE, USING LEFT INTERNAL MAMMARY ARTERY AND ENDOSCOPICALLY HARVESTED RIGHT GREATER SAPHENOUS VEIN (N/A) TRANSESOPHAGEAL ECHOCARDIOGRAM (TEE) (N/A)  Patient location during evaluation: SICU Anesthesia Type: General Level of consciousness: patient remains intubated per anesthesia plan and sedated Pain management: pain level controlled Vital Signs Assessment: post-procedure vital signs reviewed and stable Respiratory status: patient remains intubated per anesthesia plan and patient on ventilator - see flowsheet for VS Cardiovascular status: blood pressure returned to baseline Anesthetic complications: no       Last Vitals:  Vitals:   10/27/16 1900 10/27/16 2000  BP: 94/74 106/63  Pulse: 71 72  Resp: 15 13  Temp:  37.4 C    Last Pain:  Vitals:   10/27/16 2034  TempSrc:   PainSc: 4                  Carolyn Gaines

## 2016-10-28 ENCOUNTER — Inpatient Hospital Stay (HOSPITAL_COMMUNITY): Payer: BLUE CROSS/BLUE SHIELD

## 2016-10-28 DIAGNOSIS — Z951 Presence of aortocoronary bypass graft: Secondary | ICD-10-CM

## 2016-10-28 LAB — CBC
HCT: 27.4 % — ABNORMAL LOW (ref 36.0–46.0)
HEMOGLOBIN: 8.7 g/dL — AB (ref 12.0–15.0)
MCH: 28.6 pg (ref 26.0–34.0)
MCHC: 31.8 g/dL (ref 30.0–36.0)
MCV: 90.1 fL (ref 78.0–100.0)
Platelets: 170 10*3/uL (ref 150–400)
RBC: 3.04 MIL/uL — AB (ref 3.87–5.11)
RDW: 15.7 % — ABNORMAL HIGH (ref 11.5–15.5)
WBC: 8 10*3/uL (ref 4.0–10.5)

## 2016-10-28 LAB — BASIC METABOLIC PANEL
ANION GAP: 3 — AB (ref 5–15)
BUN: 15 mg/dL (ref 6–20)
CO2: 27 mmol/L (ref 22–32)
Calcium: 8.3 mg/dL — ABNORMAL LOW (ref 8.9–10.3)
Chloride: 106 mmol/L (ref 101–111)
Creatinine, Ser: 0.96 mg/dL (ref 0.44–1.00)
Glucose, Bld: 116 mg/dL — ABNORMAL HIGH (ref 65–99)
Potassium: 3.9 mmol/L (ref 3.5–5.1)
Sodium: 136 mmol/L (ref 135–145)

## 2016-10-28 LAB — GLUCOSE, CAPILLARY: GLUCOSE-CAPILLARY: 110 mg/dL — AB (ref 65–99)

## 2016-10-28 MED ORDER — ONDANSETRON HCL 4 MG/2ML IJ SOLN: 4.0000 mg | Freq: Four times a day (QID) | INTRAMUSCULAR | Status: DC | PRN

## 2016-10-28 MED ORDER — POTASSIUM CHLORIDE CRYS ER 20 MEQ PO TBCR
20.0000 meq | EXTENDED_RELEASE_TABLET | Freq: Two times a day (BID) | ORAL | Status: AC
  Administered 2016-10-28 – 2016-10-29 (×4): 20 meq via ORAL
  Filled 2016-10-28 (×4): qty 1

## 2016-10-28 MED ORDER — BISACODYL 5 MG PO TBEC
10.0000 mg | DELAYED_RELEASE_TABLET | Freq: Every day | ORAL | Status: DC | PRN
  Administered 2016-10-28 – 2016-10-31 (×2): 10 mg via ORAL
  Filled 2016-10-28 (×2): qty 2

## 2016-10-28 MED ORDER — PANTOPRAZOLE SODIUM 40 MG PO TBEC
40.0000 mg | DELAYED_RELEASE_TABLET | Freq: Every day | ORAL | Status: DC
  Administered 2016-10-28 – 2016-10-31 (×4): 40 mg via ORAL
  Filled 2016-10-28 (×4): qty 1

## 2016-10-28 MED ORDER — FUROSEMIDE 40 MG PO TABS
40.0000 mg | ORAL_TABLET | Freq: Every day | ORAL | Status: DC
  Administered 2016-10-28: 40 mg via ORAL
  Filled 2016-10-28: qty 1

## 2016-10-28 MED ORDER — MOVING RIGHT ALONG BOOK
Freq: Once | Status: AC
  Administered 2016-10-28: 1
  Filled 2016-10-28: qty 1

## 2016-10-28 MED ORDER — ASPIRIN EC 325 MG PO TBEC
325.0000 mg | DELAYED_RELEASE_TABLET | Freq: Every day | ORAL | Status: DC
  Administered 2016-10-28 – 2016-10-31 (×4): 325 mg via ORAL
  Filled 2016-10-28 (×4): qty 1

## 2016-10-28 MED ORDER — SODIUM CHLORIDE 0.9% FLUSH: 3.0000 mL | INTRAVENOUS | Status: DC | PRN

## 2016-10-28 MED ORDER — ESCITALOPRAM OXALATE 20 MG PO TABS
20.0000 mg | ORAL_TABLET | Freq: Every day | ORAL | Status: DC
  Administered 2016-10-28 – 2016-10-31 (×4): 20 mg via ORAL
  Filled 2016-10-28: qty 1
  Filled 2016-10-28: qty 2
  Filled 2016-10-28 (×2): qty 1

## 2016-10-28 MED ORDER — SODIUM CHLORIDE 0.9 % IV SOLN: 250.0000 mL | INTRAVENOUS | Status: DC | PRN

## 2016-10-28 MED ORDER — SODIUM CHLORIDE 0.9% FLUSH
3.0000 mL | Freq: Two times a day (BID) | INTRAVENOUS | Status: DC
  Administered 2016-10-28 – 2016-10-31 (×5): 3 mL via INTRAVENOUS

## 2016-10-28 MED ORDER — TRAMADOL HCL 50 MG PO TABS
50.0000 mg | ORAL_TABLET | ORAL | Status: DC | PRN
  Administered 2016-10-30 – 2016-10-31 (×3): 100 mg via ORAL
  Filled 2016-10-28 (×3): qty 2

## 2016-10-28 MED ORDER — OXYCODONE HCL 5 MG PO TABS
5.0000 mg | ORAL_TABLET | ORAL | Status: DC | PRN
  Administered 2016-10-28 – 2016-10-31 (×11): 10 mg via ORAL
  Filled 2016-10-28 (×11): qty 2

## 2016-10-28 MED ORDER — ONDANSETRON HCL 4 MG PO TABS: 4.0000 mg | ORAL_TABLET | Freq: Four times a day (QID) | ORAL | Status: DC | PRN

## 2016-10-28 MED ORDER — ACETAMINOPHEN 325 MG PO TABS: 650.0000 mg | ORAL_TABLET | Freq: Four times a day (QID) | ORAL | Status: DC | PRN

## 2016-10-28 MED ORDER — DOCUSATE SODIUM 100 MG PO CAPS
200.0000 mg | ORAL_CAPSULE | Freq: Every day | ORAL | Status: DC
  Administered 2016-10-28 – 2016-10-31 (×3): 200 mg via ORAL
  Filled 2016-10-28 (×4): qty 2

## 2016-10-28 MED ORDER — BISACODYL 10 MG RE SUPP: 10.0000 mg | Freq: Every day | RECTAL | Status: DC | PRN

## 2016-10-28 NOTE — Plan of Care (Signed)
Attempted to call report to 2W 

## 2016-10-28 NOTE — Progress Notes (Signed)
Anesthesiology Follow-up:  Awake and alert, neuro intact, in good spirits. Hemodynamically stable in SR.  VS: T- 37.5 BP- 105/56 HR-81 RR- 13 O2 Sat 98% on 3L [place]  K-3.9 BUN/Cr.-15/0.96 glucose-116 H/H- 8.7/27.4  platelets- 170,000  Extubated 6 hours post-op. 51 year old female 2 days S/P CABG X 3. Stable post-op course.

## 2016-10-28 NOTE — Plan of Care (Signed)
Report to 2 W RN 

## 2016-10-28 NOTE — Progress Notes (Signed)
2 Days Post-Op Procedure(s) (LRB): CORONARY ARTERY BYPASS GRAFTING (CABG), ON PUMP, TIMES THREE, USING LEFT INTERNAL MAMMARY ARTERY AND ENDOSCOPICALLY HARVESTED RIGHT GREATER SAPHENOUS VEIN (N/A) TRANSESOPHAGEAL ECHOCARDIOGRAM (TEE) (N/A) Subjective:  Sore but overall ok. Ambulated around ICU this am.   Brief burst of self-limited atrial fib overnight  Objective: Vital signs in last 24 hours: Temp:  [98.9 F (37.2 C)-99.9 F (37.7 C)] 99.5 F (37.5 C) (04/01 0305) Pulse Rate:  [69-84] 81 (04/01 0700) Cardiac Rhythm: Normal sinus rhythm (04/01 0700) Resp:  [10-29] 13 (04/01 0700) BP: (94-123)/(56-83) 105/56 (04/01 0700) SpO2:  [83 %-99 %] 98 % (04/01 0700) Arterial Line BP: (147-150)/(70-75) 150/75 (03/31 0900) Weight:  [80.2 kg (176 lb 12.9 oz)] 80.2 kg (176 lb 12.9 oz) (04/01 0500)  Hemodynamic parameters for last 24 hours: PAP: (29-39)/(18-24) 39/24  Intake/Output from previous day: 03/31 0701 - 04/01 0700 In: 1431 [P.O.:780; I.V.:551; IV Piggyback:100] Out: 1950 [Urine:1950] Intake/Output this shift: No intake/output data recorded.  General appearance: alert and cooperative Neurologic: intact Heart: regular rate and rhythm, S1, S2 normal, no murmur, click, rub or gallop Lungs: clear to auscultation bilaterally Extremities: extremities normal, atraumatic, no cyanosis or edema Wound: dressing dry  Lab Results:  Recent Labs  10/27/16 1530 10/27/16 1536 10/28/16 0312  WBC 9.3  --  8.0  HGB 9.2* 9.5* 8.7*  HCT 29.3* 28.0* 27.4*  PLT 181  --  170   BMET:  Recent Labs  10/27/16 0327  10/27/16 1536 10/28/16 0312  NA 135  --  138 136  K 4.8  --  4.2 3.9  CL 107  --  103 106  CO2 22  --   --  27  GLUCOSE 132*  --  125* 116*  BUN 7  --  11 15  CREATININE 0.67  < > 0.80 0.96  CALCIUM 8.1*  --   --  8.3*  < > = values in this interval not displayed.  PT/INR:  Recent Labs  10/26/16 1230  LABPROT 16.5*  INR 1.32   ABG    Component Value Date/Time   PHART 7.327 (L) 10/26/2016 1912   HCO3 25.0 10/26/2016 1912   TCO2 26 10/27/2016 1536   ACIDBASEDEF 1.0 10/26/2016 1912   O2SAT 98.0 10/26/2016 1912   CBG (last 3)   Recent Labs  10/27/16 2027 10/27/16 2314 10/28/16 0308  GLUCAP 125* 121* 110*   CLINICAL DATA:  Status post CABG surgery.  Cough and chest soreness.  EXAM: PORTABLE CHEST 1 VIEW  COMPARISON:  10/27/2016  FINDINGS: Since the previous exam, the Swan-Ganz catheter, mediastinal tube and left chest tube have been removed. The right internal jugular introducer sheath remains in place, well positioned.  There is persistent lung base opacity on the left with mild increased in lung base opacity on the right, consistent with a combination of atelectasis and pleural effusions. No evidence of pulmonary edema. No pneumothorax.  There is no mediastinal widening.  IMPRESSION: 1. Lung base opacity, increased on the right and stable on the left, consistent with a combination of atelectasis and small pleural effusions. 2. No pulmonary edema, mediastinal widening or pneumothorax. No evidence of an operative complication. 3. All lines and tubes have been removed with the exception of the right internal jugular introducer sheath.   Electronically Signed   By: Amie Portland M.D.   On: 10/28/2016 07:22   Assessment/Plan: S/P Procedure(s) (LRB): CORONARY ARTERY BYPASS GRAFTING (CABG), ON PUMP, TIMES THREE, USING LEFT INTERNAL MAMMARY ARTERY AND ENDOSCOPICALLY HARVESTED  RIGHT GREATER SAPHENOUS VEIN (N/A) TRANSESOPHAGEAL ECHOCARDIOGRAM (TEE) (N/A)  She is hemodynamically stable in sinus rhythm. Brief burst of atrial fib overnight. Continue Coreg. Hold off on ACE I since BP not hight enough yet.  Mild volume excess. Wt about 2 lbs over preop. Continue lasix for a two more days.  DC foley and sleeve and transfer to 2W  Continue IS, ambulation  Some chronic pain from fibromyalgia in upper chest and back and  takes Ultram 3 times per day at home for that.   LOS: 7 days    Alleen Borne 10/28/2016

## 2016-10-28 NOTE — Plan of Care (Signed)
Transfer to 2W13 via WC, O2 and monitor by [facility] RN

## 2016-10-29 ENCOUNTER — Inpatient Hospital Stay (HOSPITAL_COMMUNITY): Payer: BLUE CROSS/BLUE SHIELD

## 2016-10-29 ENCOUNTER — Encounter (HOSPITAL_COMMUNITY): Payer: Self-pay | Admitting: Surgery

## 2016-10-29 LAB — CBC
HCT: 26.2 % — ABNORMAL LOW (ref 36.0–46.0)
HEMOGLOBIN: 8.3 g/dL — AB (ref 12.0–15.0)
MCH: 28.2 pg (ref 26.0–34.0)
MCHC: 31.7 g/dL (ref 30.0–36.0)
MCV: 89.1 fL (ref 78.0–100.0)
PLATELETS: 197 10*3/uL (ref 150–400)
RBC: 2.94 MIL/uL — ABNORMAL LOW (ref 3.87–5.11)
RDW: 15.6 % — ABNORMAL HIGH (ref 11.5–15.5)
WBC: 6.2 10*3/uL (ref 4.0–10.5)

## 2016-10-29 LAB — POCT I-STAT 4, (NA,K, GLUC, HGB,HCT)
GLUCOSE: 124 mg/dL — AB (ref 65–99)
HCT: 24 % — ABNORMAL LOW (ref 36.0–46.0)
Hemoglobin: 8.2 g/dL — ABNORMAL LOW (ref 12.0–15.0)
POTASSIUM: 4 mmol/L (ref 3.5–5.1)
Sodium: 143 mmol/L (ref 135–145)

## 2016-10-29 LAB — GLUCOSE, CAPILLARY: Glucose-Capillary: 110 mg/dL — ABNORMAL HIGH (ref 65–99)

## 2016-10-29 MED ORDER — FUROSEMIDE 10 MG/ML IJ SOLN
40.0000 mg | Freq: Once | INTRAMUSCULAR | Status: AC
Start: 2016-10-29 — End: 2016-10-29
  Administered 2016-10-29: 40 mg via INTRAVENOUS
  Filled 2016-10-29: qty 4

## 2016-10-29 MED ORDER — FUROSEMIDE 40 MG PO TABS: 40.0000 mg | ORAL_TABLET | Freq: Every day | ORAL | Status: DC

## 2016-10-29 MED ORDER — FERROUS SULFATE 325 (65 FE) MG PO TABS
325.0000 mg | ORAL_TABLET | Freq: Every day | ORAL | Status: DC
  Administered 2016-10-29 – 2016-10-31 (×3): 325 mg via ORAL
  Filled 2016-10-29 (×3): qty 1

## 2016-10-29 MED ORDER — GUAIFENESIN ER 600 MG PO TB12
600.0000 mg | ORAL_TABLET | Freq: Two times a day (BID) | ORAL | Status: DC
  Administered 2016-10-29 – 2016-10-31 (×5): 600 mg via ORAL
  Filled 2016-10-29 (×5): qty 1

## 2016-10-29 MED FILL — Thrombin For Soln Kit 20000 Unit: CUTANEOUS | Qty: 1 | Status: AC

## 2016-10-29 NOTE — Progress Notes (Addendum)
      [address]       [place] [address]             [phone]        3 Days Post-Op Procedure(s) (LRB): CORONARY ARTERY BYPASS GRAFTING (CABG), ON PUMP, TIMES THREE, USING LEFT INTERNAL MAMMARY ARTERY AND ENDOSCOPICALLY HARVESTED RIGHT GREATER SAPHENOUS VEIN (N/A) TRANSESOPHAGEAL ECHOCARDIOGRAM (TEE) (N/A)  Subjective: Patient with incisional pain and cough.  Objective: Vital signs in last 24 hours: Temp:  [98.9 F (37.2 C)-99.7 F (37.6 C)] 98.9 F (37.2 C) (04/02 0425) Pulse Rate:  [78-88] 88 (04/02 0425) Cardiac Rhythm: Normal sinus rhythm (04/01 1950) Resp:  [14-22] 20 (04/02 0425) BP: (99-118)/(54-69) 113/69 (04/02 0425) SpO2:  [91 %-97 %] 92 % (04/02 0425) Weight:  [85.7 kg (188 lb 15 oz)] 85.7 kg (188 lb 15 oz) (04/02 0425)  Pre op weight 80 kg Current Weight  10/29/16 85.7 kg (188 lb 15 oz)      Intake/Output from previous day: 04/01 0701 - 04/02 0700 In: 260 [P.O.:240; I.V.:20] Out: 75 [Urine:75]   Physical Exam:  Cardiovascular: RRR Pulmonary: Diminished at bases Abdomen: Soft, non tender, bowel sounds present. Extremities: Mild bilateral lower extremity edema. Wounds: Clean and dry.  No erythema or signs of infection.  Lab Results: CBC: Recent Labs  10/28/16 0312 10/29/16 0205  WBC 8.0 6.2  HGB 8.7* 8.3*  HCT 27.4* 26.2*  PLT 170 197   BMET:  Recent Labs  10/27/16 0327  10/27/16 1536 10/28/16 0312  NA 135  --  138 136  K 4.8  --  4.2 3.9  CL 107  --  103 106  CO2 22  --   --  27  GLUCOSE 132*  --  125* 116*  BUN 7  --  11 15  CREATININE 0.67  < > 0.80 0.96  CALCIUM 8.1*  --   --  8.3*  < > = values in this interval not displayed.  PT/INR:  Lab Results  Component Value Date   INR 1.32 10/26/2016   INR 1.04 10/20/2016   INR 1.11 07/03/2016   ABG:  INR: Will add last result for INR, ABG once components are confirmed Will add last 4 CBG results once components are confirmed  Assessment/Plan:  1. CV  - SR in the 80's. On Coreg 3.125 mg bid. BP too labile for ACE or ARB. 2.  Pulmonary - On 2 liters of oxygen via [place]. Wean to room air as tolerates. Has not had a PA/LAT CXR so will order. Encourage incentive spirometer. Mucinex for cough. 3. Volume Overload - On Lasix 40 mg daily and will give IV this am 4.  Acute blood loss anemia - H and H stable at 8.3 and 26.2. Will start oral Ferrous sulfate. 5. Remove EPW 6. Possibly home 1-2 days  ZIMMERMAN,DONIELLE MPA-C 10/29/2016,7:21 AM  Progressing well, walked 3 times today Poss home by Wednesday I have seen and examined Carolyn Gaines and agree with the above assessment  and plan.  Delight Ovens MD [facility] [phone] Office [phone] 10/29/2016 7:17 PM

## 2016-10-29 NOTE — Discharge Instructions (Signed)
Coronary Artery Bypass Grafting, Care After ° °This sheet gives you information about how to care for yourself after your procedure. Your health care provider may also give you more specific instructions. If you have problems or questions, contact your health care provider. °What can I expect after the procedure? °After the procedure, it is common to have: °· Nausea and a lack of appetite. °· Constipation. °· Weakness and fatigue. °· Depression or irritability. °· Pain or discomfort in your incision areas. °Follow these instructions at home: °Medicines  °· Take over-the-counter and prescription medicines only as told by your health care provider. Do not stop taking medicines or start any new medicines without approval from your health care provider. °· If you were prescribed an antibiotic medicine, take it as told by your health care provider. Do not stop taking the antibiotic even if you start to feel better. °· Do not drive or use heavy machinery while taking prescription pain medicine. °Incision care  °· Follow instructions from your health care provider about how to take care of your incisions. Make sure you: °¨ Wash your hands with soap and water before you change your bandage (dressing). If soap and water are not available, use hand sanitizer. °¨ Change your dressing as told by your health care provider. °¨ Leave stitches (sutures), skin glue, or adhesive strips in place. These skin closures may need to stay in place for 2 weeks or longer. If adhesive strip edges start to loosen and curl up, you may trim the loose edges. Do not remove adhesive strips completely unless your health care provider tells you to do that. °· Keep incision areas clean, dry, and protected. °· Check your incision areas every day for signs of infection. Check for: °¨ More redness, swelling, or pain. °¨ More fluid or blood. °¨ Warmth. °¨ Pus or a bad smell. °· If incisions were made in your legs: °¨ Avoid crossing your legs. °¨ Avoid  sitting for long periods of time. Change positions every 30 minutes. °¨ Raise (elevate) your legs when you are sitting. °Bathing  °· Do not take baths, swim, or use a hot tub until your health care provider approves. °· Only take sponge baths. Pat the incisions dry. Do not rub incisions with a washcloth or towel. °· Ask your health care provider when you can shower. °Eating and drinking  °· Eat foods that are high in fiber, such as raw fruits and vegetables, whole grains, beans, and nuts. Meats should be lean cut. Avoid canned, processed, and fried foods. This can help prevent constipation and is a recommended part of a heart-healthy diet. °· Drink enough fluid to keep your urine clear or pale yellow. °· Limit alcohol intake to no more than 1 drink a day for nonpregnant women and 2 drinks a day for men. One drink equals 12 oz of beer, 5 oz of wine, or 1½ oz of hard liquor. °Activity  °· Rest and limit your activity as told by your health care provider. You may be instructed to: °¨ Stop any activity right away if you have chest pain, shortness of breath, irregular heartbeats, or dizziness. Get help right away if you have any of these symptoms. °¨ Move around frequently for short periods or take short walks as directed by your health care provider. Gradually increase your activities. You may need physical therapy or cardiac rehabilitation to help strengthen your muscles and build your endurance. °¨ Avoid lifting, pushing, or pulling anything that is heavier than 10   lb (4.5 kg) for at least 6 weeks or as told by your health care provider. °· Do not drive until your health care provider approves. °· Ask your health care provider when you may return to work. °· Ask your health care provider when you may resume sexual activity. °General instructions  °· Do not use any products that contain nicotine or tobacco, such as cigarettes and e-cigarettes. If you need help quitting, ask your health care provider. °· Take 2-3 deep  breaths every few hours during the day, while you recover. This helps expand your lungs and prevent complications like pneumonia after surgery. °· If you were given a device called an incentive spirometer, use it several times a day to practice deep breathing. Support your chest with a pillow or your arms when you take deep breaths or cough. °· Wear compression stockings as told by your health care provider. These stockings help to prevent blood clots and reduce swelling in your legs. °· Weigh yourself every day. This helps identify if your body is holding (retaining) fluid that may make your heart and lungs work harder. °· Keep all follow-up visits as told by your health care provider. This is important. °Contact a health care provider if: °· You have more redness, swelling, or pain around any incision. °· You have more fluid or blood coming from any incision. °· Any incision feels warm to the touch. °· You have pus or a bad smell coming from any incision °· You have a fever. °· You have swelling in your ankles or legs. °· You have pain in your legs. °· You gain 2 lb (0.9 kg) or more a day. °· You are nauseous or you vomit. °· You have diarrhea. °Get help right away if: °· You have chest pain that spreads to your jaw or arms. °· You are short of breath. °· You have a fast or irregular heartbeat. °· You notice a "clicking" in your breastbone (sternum) when you move. °· You have numbness or weakness in your arms or legs. °· You feel dizzy or light-headed. °Summary °· After the procedure, it is common to have pain or discomfort in the incision areas. °· Do not take baths, swim, or use a hot tub until your health care provider approves. °· Gradually increase your activities. You may need physical therapy or cardiac rehabilitation to help strengthen your muscles and build your endurance. °· Weigh yourself every day. This helps identify if your body is holding (retaining) fluid that may make your heart and lungs work  harder. °This information is not intended to replace advice given to you by your health care provider. Make sure you discuss any questions you have with your health care provider. °Document Released: 02/02/2005 Document Revised: 06/04/2016 Document Reviewed: 06/04/2016 °Elsevier Interactive Patient Education © 2017 [employer] ° °

## 2016-10-29 NOTE — Progress Notes (Signed)
CARDIAC REHAB PHASE I   PRE:  Rate/Rhythm: 80 SR    BP: sitting 114/54    SaO2: 91 2L  MODE:  Ambulation: 350 ft   POST:  Rate/Rhythm: 84 SR    BP: sitting 124/70     SaO2: 93-94 2L  Pt moving fairly well walking although I needed to remind her not to let people pull on her arms to get out of bed. Used RW and 2L O2. Unable to wean O2. Pt congested and groggy. To recliner after walk. Encouraged more IS. Wants to walk later. [phone]   Carolyn Gaines CES, ACSM 10/29/2016 12:07 PM

## 2016-10-29 NOTE — Discharge Summary (Signed)
Physician Discharge Summary       [address] [place]       [place] [address]             [phone]    Patient ID: Carolyn Gaines MRN: [id number] DOB/AGE: [birth year] 51 y.o.  Admit date: 10/20/2016 Discharge date: 10/31/2016  Admission Diagnoses: 1. Unstable angina (HCC) 2. History of coronary artery disease-s/p MI 2017 and PCi with stent  Active Diagnoses:  1. Depression with anxiety 2. Essential hypertension 3. Fibromyalgia 4. ABL anemia  Procedure (s):  Left Heart Cath and Coronary Angiography by Dr. Tresa Endo on 10/22/2016:  Conclusion     Mid LAD lesion, 50 %stenosed.  Ost LM lesion, 85 %stenosed.  Mid RCA-1 lesion, 50 %stenosed.  Mid RCA-2 lesion, 0 %stenosed.  A drug eluting .  Prox RCA lesion, 20 %stenosed.   Severe native CAD with 80-90% ostial stenosis of the left main coronary artery which did not improve following IC nitroglycerin administration; 50% mid LAD stenoses; normal left circumflex vessel; and patent mid RCA stent with 45-50% smooth eccentric stenosis proximal to the stented segment and 20% proximal irregularity.  Mild  residual LV dysfunction with an ejection fraction of 45-50% with mild mid inferior hypocontractility.  RECOMMENDATION: The patient has been on full dose Brilinta since her DES stent implantation in July 2018.  Surgical consultation was obtained with Dr. Tyrone Sage.  Brilinta will be discontinued.  The patient will be stabilized on IV NTG with plans to reinstitute heparin. Plan for CABG surgery later this week following several days of Brilinta washout.   If the patient becomes unstable, consider emergent left main stenting.    1. Median Sternotomy 2. Extracorporeal circulation 3.   Coronary artery bypass grafting x 3   Left internal mammary graft to the LAD  SVG to OM  SVG to RCA  4.   Endoscopic vein harvest from the right leg by Dr. Laneta Simmers on 10/26/2016.  History of Presenting Illness: This is a 51 year  old Caucasian female with a past medical history of  history of acute inferior STEMI on 02/19/2016 for which underwent emergent PCI/DES RCA.She has residual 60-70% mid LAD stenosis. She  Was readmitted 07/03/16 and 07/19/16 with recurrent chest pain felt to be atypical. Symptoms were reproducible, normal EKG and negative cardiac markers.  2-D echo 03/21/16 normal LVEF 55% trace of MR.Nuclear stress test 04/16/16 small basal septal scar with very mild peri-infarct ischemia, small to medium size anterior apical and apical scar with no significant peri-infarct ischemia, LVEF moderately depressed at 40% with global hypokinesis. Intermediate risk study.  Seen by Carolyn Gaines 09/19/16 to establish cardiac care in [place] as patient lives there. Chronic chest pain since MI. She is under a lot of stress and suffering from anxiety/depression. Unable to titrate meds due to soft BP. Plan discussed with Dr. Wyline Mood and follow up made with him in 10/2016.  She was treated with abx 2 weeks ago for bronchitis. Still recovering. Intermittent cough. She has chronic upper chest pain due to fibromyalgia. She was lifting heavy box at work and developed lower sternal chest pressure 10/10  that radiated to L shoulder. Similar to prior MI and different from typical fibromyalgia pain. Pain did not improved with rest. Minimally improved pressure after SL nitro x 1. BP was very high and EMS called. Given ASA 324 and another SL nitro x 1. Subsequent drop in BP requiring bolus of 1 L fluid. She is admitted for further evaluation.  D-dimer 1.6. Low threshold for PE and did not pursued PE workup per primary note. Troponin negative x 4. EKG showed NSR without acute changes. CXR without acute abnormality.   She still have lowe sternal chest pressure 5/10. Her pain/pressure reproducible with palpation. She is unable to tell me that this her fibromyalgia pain or pain similar to prior MI during palpation.  She has been taking her  brilinta including just before cath and does have multiple bruises which she relates to this medication. She underwent a cardiac catheterization on 10/22/2016 and was found to have progression of ostial left main disease compared to previous cath 6 months ago, in addition to other significant blockages. Dr. Tyrone Sage was consulted. He recommended wash out Brilinta while on heparin, follow P2Y12 testing and proceed with CABG later in week. If urgent change ,left main  is suitable to stent  with no calcium but not ideal long term. Potential risks, benefits, and complications of the surgery were discussed with the patient and she agreed to proceed with surgery. She underwent a CABG x 3 on 10/26/2016.  Brief Hospital Course:  The patient was extubated the evening of surgery without difficulty. She remained afebrile and hemodynamically stable. [name], a line, chest tubes, and foley were removed early in the post operative course. Lopressor was started and titrated accordingly. She did have a brief burst of a fib the evening of 03/31 but had not other episodes. She was volume over loaded and diuresed. She had ABL anemia. She did not require a post op transfusion. Last H and H was 8.3 and 26.2. She was started on oral Ferrous sulfate. She was weaned off the insulin drip.  The patient was felt surgically stable for transfer from the ICU to [facility] for further convalescence on 10/28/2016. She continues to progress with cardiac rehab. She was ambulating on 2 liters of oxygen via [place]. She will need oxygen at home.  She has been tolerating a diet and has had a bowel movement. Epicardial pacing wires were removed on 10/29/2016. Chest tube sutures will be removed in the office after discharge. As discussed with Dr. Tyrone Sage, will restart Brillinta and decrease ecasa to 81 mg daily.The patient is felt surgically stable for discharge today.   Latest Vital Signs: Blood pressure 100/60, pulse 86, temperature 98.4 F (36.9 C),  temperature source Oral, resp. rate 18, height 5\' 4"  (1.626 m), weight 76.2 kg (168 lb), SpO2 91 %.  Physical Exam: Cardiovascular: RRR Pulmonary: Diminished at bases Abdomen: Soft, non tender, bowel sounds present. Extremities: Mild bilateral lower extremity edema. Wounds: Clean and dry.  No erythema or signs of infection.  Discharge Condition:Stable and discharged to home.  Recent laboratory studies:  Lab Results  Component Value Date   WBC 6.2 10/29/2016   HGB 8.3 (L) 10/29/2016   HCT 26.2 (L) 10/29/2016   MCV 89.1 10/29/2016   PLT 197 10/29/2016   Lab Results  Component Value Date   NA 136 10/28/2016   K 3.9 10/28/2016   CL 106 10/28/2016   CO2 27 10/28/2016   CREATININE 0.96 10/28/2016   GLUCOSE 116 (H) 10/28/2016    Diagnostic Studies:  CLINICAL DATA:  Pleural effusions  EXAM: CHEST  2 VIEW  COMPARISON:  October 28, 2016.  FINDINGS: There are pleural effusions bilaterally with patchy bibasilar atelectasis. Central catheter has been removed. No pneumothorax. Temporary pacemaker wires remain attached to the right heart. Heart size and pulmonary vascularity are normal. No adenopathy. No bone lesions. Patient is  status post coronary artery bypass grafting.  IMPRESSION: Persistent pleural effusions with bibasilar atelectasis. Lungs elsewhere clear. Stable cardiac silhouette. No evident pneumothorax.   Electronically Signed   By: Bretta Bang III M.D.   On: 10/29/2016 08:07  Discharge Medications: Allergies as of 10/31/2016      Reactions   Bee Venom Other (See Comments)   Pus sites at injection   Adhesive [tape] Itching, Swelling   Please use paper tape   Triple Antibiotic [bacitracin-neomycin-polymyxin] Rash      Medication List    STOP taking these medications   ketorolac 10 MG tablet Commonly known as:  TORADOL   mupirocin nasal ointment 2 % Commonly known as:  BACTROBAN   nitroGLYCERIN 0.4 MG SL tablet Commonly known as:  NITROSTAT    traMADol 50 MG tablet Commonly known as:  ULTRAM     TAKE these medications   acetaminophen 325 MG tablet Commonly known as:  TYLENOL Take 2 tablets (650 mg total) by mouth every 6 (six) hours as needed for headache. What changed:  medication strength  how much to take   albuterol 108 (90 Base) MCG/ACT inhaler Commonly known as:  PROVENTIL HFA;VENTOLIN HFA Inhale 2 puffs into the lungs every 4 (four) hours as needed for wheezing or shortness of breath.   aspirin 81 MG EC tablet Take 1 tablet (81 mg total) by mouth daily.   carvedilol 3.125 MG tablet Commonly known as:  COREG Take 1 tablet (3.125 mg total) by mouth 2 (two) times daily with a meal.   escitalopram 20 MG tablet Commonly known as:  LEXAPRO Take 20 mg by mouth daily.   ferrous sulfate 325 (65 FE) MG tablet Take 1 tablet (325 mg total) by mouth daily with breakfast. For ONE MONTH then stop. Start taking on:  11/01/2016   furosemide 40 MG tablet Commonly known as:  LASIX Take 1 tablet (40 mg total) by mouth daily. For one week then stop.   guaiFENesin 600 MG 12 hr tablet Commonly known as:  MUCINEX Take 1 tablet (600 mg total) by mouth 2 (two) times daily as needed.   oxyCODONE 5 MG immediate release tablet Commonly known as:  Oxy IR/ROXICODONE Take 5 mg by mouth every 4-6 hours PRN severe pain   potassium chloride SA 20 MEQ tablet Commonly known as:  K-DUR,KLOR-CON Take 1 tablet (20 mEq total) by mouth daily. For one week then stop.   rosuvastatin 40 MG tablet Commonly known as:  CRESTOR Take 1 tablet (40 mg total) by mouth daily at 6 PM. What changed:  medication strength  how much to take   ticagrelor 90 MG Tabs tablet Commonly known as:  BRILINTA Take 1 tablet (90 mg total) by mouth 2 (two) times daily.            Durable Medical Equipment        Start     Ordered   10/30/16 0735  For home use only DME oxygen  Once    Question Answer Comment  Mode or (Route) Nasal cannula     Liters per Minute 2   Frequency Continuous (stationary and portable oxygen unit needed)   Oxygen delivery system Gas      10/30/16 0735     The patient has been discharged on:   1.Beta Blocker:  Yes [  x ]  No   [   ]                              If No, reason:  2.Ace Inhibitor/ARB: Yes [   ]                                     No  [    ]                                     If No, reason:  3.Statin:   Yes [ x  ]                  No  [   ]                  If No, reason:  4.Ecasa:  Yes  [ x  ]                  No   [   ]                  If No, reason: Follow Up Appointments: Follow-up Information    Alleen Borne, MD Follow up on 11/28/2016.   Specialty:  Cardiothoracic Surgery Why:  PA/LAT CXR to be taken (at [facility] which is in the same building as Dr. Sharee Pimple office) on 11/28/2016 at 9:30 am;Appointment time is at 10:00 am Contact information: [address] [place] [address] [phone]        Joni Reining, NP Follow up on 11/13/2016.   Specialties:  Nurse Practitioner, Radiology, Cardiology Why:  Appointment time is at 1:30 pm Contact information: [address] [place] [address] [phone]        Nurse Follow up on 11/09/2016.   Why:  Appointment is with nurse only to have chest tube sutures removed. Appointment time is at 10:00 am Contact information: [address] [place] [address]       Inc[facility] - D[facility] Follow up.   Why:  home 02 arranged- portable tank to be delivered to room prior to discharge.  Contact information: [address] [place] [address] [phone]           Signed: Doree Fudge MPA-C 10/31/2016, 8:41 AM

## 2016-10-29 NOTE — Progress Notes (Signed)
Pt ambulated 374ft on 2L O2. Pt tolerated it well will continue to monitor.

## 2016-10-29 NOTE — Progress Notes (Signed)
EPW d/c'd per order and per protocol. Tips intact. VSS. Pt tolerated well. Pt educated on need for q15min vitals and bedrest x1h. Call bell and phone within reach. Will continue to monitor. 

## 2016-10-30 MED ORDER — POTASSIUM CHLORIDE CRYS ER 20 MEQ PO TBCR
20.0000 meq | EXTENDED_RELEASE_TABLET | Freq: Two times a day (BID) | ORAL | Status: AC
  Administered 2016-10-30 (×2): 20 meq via ORAL
  Filled 2016-10-30 (×2): qty 1

## 2016-10-30 MED ORDER — FUROSEMIDE 10 MG/ML IJ SOLN
40.0000 mg | Freq: Two times a day (BID) | INTRAMUSCULAR | Status: AC
  Administered 2016-10-30 (×2): 40 mg via INTRAVENOUS
  Filled 2016-10-30 (×2): qty 4

## 2016-10-30 NOTE — Progress Notes (Signed)
SATURATION QUALIFICATIONS: (This note is used to comply with regulatory documentation for home oxygen)  Patient Saturations on Room Air at Rest = 91%  Patient Saturations on Room Air while Ambulating = 88%  Patient Saturations on 2 Liters of oxygen while Ambulating = 94%  Please briefly explain why patient needs home oxygen:Pt's sats drop in the 80's when walking on RA. Carolyn Gaines

## 2016-10-30 NOTE — Progress Notes (Addendum)
Pt ambulated 347ft this am. Pt wanted to try walking without her O2, but became SOB and began to desat down to 85. Pt walked the rest of the way on 2L O2. Tolerated well.

## 2016-10-30 NOTE — Progress Notes (Signed)
CARDIAC REHAB PHASE I   PRE:  Rate/Rhythm: 83 SR    BP: sitting 108/60    SaO2: 95 2L  MODE:  Ambulation: 550 ft   POST:  Rate/Rhythm: 96 SR    BP: sitting 120/76     SaO2: 90-92 RA  Pt ambulated without O2 or RW. Steady, more upright. Pt with congested cough that she sts it is hard to produce phlegm. Seems to struggle to get deep breath in. SAO2 90-92 RA after walking. 89-90 RA during walk (spot check). Pt comfortable without O2 but will need to be tested while asleep. Only inspiring 500 mL on IS. Encouraged more use. To bed in sitting position. [phone]   Harriet Masson CES, ACSM 10/30/2016 2:38 PM

## 2016-10-30 NOTE — Care Management Note (Signed)
Case Management Note Donn Pierini RN, BSN Unit 2W-Case Manager [phone]  Patient Details  Name: Carolyn Gaines MRN: [id number] Date of Birth: [birth year]  Subjective/Objective:   Pt admitted s/p CABG x3 on 3/30- on Brilinta PTA-                  Action/Plan: PTA pt lived at home, plan to return home- will need home 02- orders placed- have spoken with Jermaine at [facility] regarding DME- home 02 needs- portable tank to be delivered to room prior to discharge.   Expected Discharge Date:  10/31/16           Expected Discharge Plan:  Home/Self Care  In-House Referral:     Discharge planning Services  CM Consult  Post Acute Care Choice:  Durable Medical Equipment Choice offered to:  Patient  DME Arranged:  Oxygen DME Agency:  [facility]  HH Arranged:  NA HH Agency:  NA  Status of Service:  In process, will continue to follow  If discussed at Long Length of Stay Meetings, dates discussed:  4/3  Discharge Disposition:   Additional Comments:  Darrold Span, RN 10/30/2016, 2:30 PM

## 2016-10-30 NOTE — Progress Notes (Signed)
10/30/2016 1130 Pt ambulated 438ft with rolling walker.  Did require O2 at 2L.  Pt tolerated well. Kathryne Hitch

## 2016-10-30 NOTE — Progress Notes (Addendum)
      [address]       [place] [address]             [phone]        4 Days Post-Op Procedure(s) (LRB): CORONARY ARTERY BYPASS GRAFTING (CABG), ON PUMP, TIMES THREE, USING LEFT INTERNAL MAMMARY ARTERY AND ENDOSCOPICALLY HARVESTED RIGHT GREATER SAPHENOUS VEIN (N/A) TRANSESOPHAGEAL ECHOCARDIOGRAM (TEE) (N/A)  Subjective: Patient already walked this am. She states Mucinex does help with cough/phlegm.  Objective: Vital signs in last 24 hours: Temp:  [98.1 F (36.7 C)-98.9 F (37.2 C)] 98.9 F (37.2 C) (04/03 0441) Pulse Rate:  [72-83] 82 (04/03 0441) Cardiac Rhythm: Normal sinus rhythm (04/02 1900) Resp:  [18] 18 (04/03 0441) BP: (98-125)/(55-69) 115/63 (04/03 0441) SpO2:  [93 %-99 %] 93 % (04/03 0441) Weight:  [78.5 kg (173 lb 1 oz)] 78.5 kg (173 lb 1 oz) (04/03 0441)  Pre op weight 80 kg Current Weight  10/30/16 78.5 kg (173 lb 1 oz)      Intake/Output from previous day: 04/02 0701 - 04/03 0700 In: 600 [P.O.:600] Out: 1300 [Urine:1300]   Physical Exam:  Cardiovascular: RRR Pulmonary: Diminished at bases, some crackles Abdomen: Soft, non tender, bowel sounds present. Extremities: Mild bilateral lower extremity edema. Wounds: Clean and dry.  No erythema or signs of infection.  Lab Results: CBC:  Recent Labs  10/28/16 0312 10/29/16 0205  WBC 8.0 6.2  HGB 8.7* 8.3*  HCT 27.4* 26.2*  PLT 170 197   BMET:   Recent Labs  10/27/16 1536 10/28/16 0312  NA 138 136  K 4.2 3.9  CL 103 106  CO2  --  27  GLUCOSE 125* 116*  BUN 11 15  CREATININE 0.80 0.96  CALCIUM  --  8.3*    PT/INR:  Lab Results  Component Value Date   INR 1.32 10/26/2016   INR 1.04 10/20/2016   INR 1.11 07/03/2016   ABG:  INR: Will add last result for INR, ABG once components are confirmed Will add last 4 CBG results once components are confirmed  Assessment/Plan:  1. CV - SR in the 80's. On Coreg 3.125 mg bid. 2.  Pulmonary - On 2 liters of oxygen via  [place]. Desats in the 80's on room air with ambulation. She will need oxygen at discharge. CXR done yesterday showed bilateral pleural effusions R>L, no pneumothorax. Encourage incentive spirometer. Mucinex for cough. 3. Volume Overload - Will give Lasix IV two times today. 4.  Acute blood loss anemia - H and H slightly decreased to 8.3 and 26.2. Will continue oral Ferrous sulfate. 5. Possibly home in am  Gaines,Carolyn MPA-C 10/30/2016,7:23 AM   I have seen and examined Carolyn Gaines and agree with the above assessment  and plan.  Delight Ovens MD [facility] [phone] Office [phone] 10/30/2016 10:36 AM

## 2016-10-31 MED ORDER — ASPIRIN 325 MG PO TBEC: 325.0000 mg | DELAYED_RELEASE_TABLET | Freq: Every day | ORAL | 0 refills | Status: DC

## 2016-10-31 MED ORDER — OXYCODONE HCL 5 MG PO TABS: ORAL_TABLET | ORAL | 0 refills | Status: DC

## 2016-10-31 MED ORDER — FUROSEMIDE 40 MG PO TABS: 40.0000 mg | ORAL_TABLET | Freq: Every day | ORAL | 0 refills | Status: DC

## 2016-10-31 MED ORDER — ACETAMINOPHEN 325 MG PO TABS: 650.0000 mg | ORAL_TABLET | Freq: Four times a day (QID) | ORAL | Status: DC | PRN

## 2016-10-31 MED ORDER — POTASSIUM CHLORIDE CRYS ER 20 MEQ PO TBCR: 20.0000 meq | EXTENDED_RELEASE_TABLET | Freq: Every day | ORAL | 0 refills | Status: DC

## 2016-10-31 MED ORDER — ROSUVASTATIN CALCIUM 40 MG PO TABS: 40.0000 mg | ORAL_TABLET | Freq: Every day | ORAL | 1 refills | Status: DC

## 2016-10-31 MED ORDER — FERROUS SULFATE 325 (65 FE) MG PO TABS: 325.0000 mg | ORAL_TABLET | Freq: Every day | ORAL | 3 refills | Status: DC

## 2016-10-31 MED ORDER — POTASSIUM CHLORIDE CRYS ER 20 MEQ PO TBCR
20.0000 meq | EXTENDED_RELEASE_TABLET | Freq: Every day | ORAL | Status: DC
  Administered 2016-10-31: 20 meq via ORAL
  Filled 2016-10-31: qty 1

## 2016-10-31 MED ORDER — GUAIFENESIN ER 600 MG PO TB12: 600.0000 mg | ORAL_TABLET | Freq: Two times a day (BID) | ORAL | Status: DC | PRN

## 2016-10-31 MED ORDER — FUROSEMIDE 40 MG PO TABS
40.0000 mg | ORAL_TABLET | Freq: Every day | ORAL | Status: DC
  Administered 2016-10-31: 40 mg via ORAL
  Filled 2016-10-31: qty 1

## 2016-10-31 NOTE — Progress Notes (Signed)
CARDIAC REHAB PHASE I   Pt states she walked this morning with no complaints, declines additional ambulation at this time as she is ready for discharge. Cardiac surgery discharge education completed with pt and family at bedside. Reviewed risk factors, tobacco cessation, IS, sternal precautions, activity progression, exercise, heart healthy diet, daily weights and phase 2 cardiac rehab. Pt verbalized understanding. Pt agrees to phase 2 cardiac rehab referral, will send to [facility] per pt request. Pt in bed, call bell within reach.    [id number] Joylene Grapes, RN, BSN 10/31/2016 10:49 AM

## 2016-10-31 NOTE — Progress Notes (Signed)
Pt walked this morning around unit by herself without my awareness. She walked 663ft and stated that she had minimal SOB. She is currently resting comfortably in bed.

## 2016-10-31 NOTE — Progress Notes (Addendum)
      [address]       [place] [address]             [phone]        5 Days Post-Op Procedure(s) (LRB): CORONARY ARTERY BYPASS GRAFTING (CABG), ON PUMP, TIMES THREE, USING LEFT INTERNAL MAMMARY ARTERY AND ENDOSCOPICALLY HARVESTED RIGHT GREATER SAPHENOUS VEIN (N/A) TRANSESOPHAGEAL ECHOCARDIOGRAM (TEE) (N/A)  Subjective: Patient already walked this am. She wants to go home.  Objective: Vital signs in last 24 hours: Temp:  [98.4 F (36.9 C)-99.9 F (37.7 C)] 98.4 F (36.9 C) (04/04 0432) Pulse Rate:  [84-92] 91 (04/04 0432) Cardiac Rhythm: Normal sinus rhythm (04/03 1900) Resp:  [18] 18 (04/04 0432) BP: (108-118)/(63-71) 108/63 (04/04 0432) SpO2:  [90 %-98 %] 91 % (04/04 0432) Weight:  [76.2 kg (168 lb)] 76.2 kg (168 lb) (04/04 0432)  Pre op weight 80 kg Current Weight  10/31/16 76.2 kg (168 lb)      Intake/Output from previous day: 04/03 0701 - 04/04 0700 In: 1083 [P.O.:1080; I.V.:3] Out: 3450 [Urine:3450]   Physical Exam:  Cardiovascular: RRR Pulmonary: Slightly diminished at bases Abdomen: Soft, non tender, bowel sounds present. Extremities: Trace bilateral lower extremity edema. Wounds: Clean and dry.  No erythema or signs of infection.  Lab Results: CBC:  Recent Labs  10/29/16 0205  WBC 6.2  HGB 8.3*  HCT 26.2*  PLT 197   BMET:  No results for input(s): NA, K, CL, CO2, GLUCOSE, BUN, CREATININE, CALCIUM in the last 72 hours.  PT/INR:  Lab Results  Component Value Date   INR 1.32 10/26/2016   INR 1.04 10/20/2016   INR 1.11 07/03/2016   ABG:  INR: Will add last result for INR, ABG once components are confirmed Will add last 4 CBG results once components are confirmed  Assessment/Plan:  1. CV - SR in the 80's. On Coreg 3.125 mg bid. BP too labile to start ACE/ARB but will try to start as outpatient. Was on Brillinta pre op and as discussed with Dr. Tyrone Sage, will restart and decrease ecasa to 81 mg. 2.  Pulmonary - On  2 liters of oxygen via [place]. Desats in the 80's on room air with ambulation. She will need oxygen at discharge. Encourage incentive spirometer. Mucinex for cough. 3. Volume Overload - Had very good diuresis with Lasix IV two times yesterday. Will continue oral Lasix for one week. 4.  Acute blood loss anemia - H and H slightly decreased to 8.3 and 26.2. Will continue oral Ferrous sulfate. 5. Discharge  Melchizedek Espinola MPA-C 10/31/2016,7:18 AM

## 2016-10-31 NOTE — Progress Notes (Signed)
10/31/2016 1240 Discharge AVS meds taken today and those due this evening reviewed.  Follow-up appointments and when to call md reviewed.  D/C IV and TELE.  Questions and concerns addressed.   D/C home per orders. Kathryne Hitch

## 2016-11-09 ENCOUNTER — Other Ambulatory Visit: Payer: Self-pay | Admitting: Physician Assistant

## 2016-11-09 ENCOUNTER — Encounter (INDEPENDENT_AMBULATORY_CARE_PROVIDER_SITE_OTHER): Payer: Self-pay

## 2016-11-09 DIAGNOSIS — Z951 Presence of aortocoronary bypass graft: Secondary | ICD-10-CM

## 2016-11-09 DIAGNOSIS — Z4802 Encounter for removal of sutures: Secondary | ICD-10-CM

## 2016-11-11 ENCOUNTER — Emergency Department (HOSPITAL_COMMUNITY): Payer: BLUE CROSS/BLUE SHIELD

## 2016-11-11 ENCOUNTER — Emergency Department (HOSPITAL_COMMUNITY)
Admission: EM | Admit: 2016-11-11 | Discharge: 2016-11-11 | Disposition: A | Discharge status: Home / Self Care | Payer: BLUE CROSS/BLUE SHIELD | Attending: Emergency Medicine | Admitting: Emergency Medicine

## 2016-11-11 ENCOUNTER — Encounter (HOSPITAL_COMMUNITY): Payer: Self-pay | Admitting: Emergency Medicine

## 2016-11-11 DIAGNOSIS — R0789 Other chest pain: Secondary | ICD-10-CM | POA: Diagnosis not present

## 2016-11-11 DIAGNOSIS — Z87891 Personal history of nicotine dependence: Secondary | ICD-10-CM | POA: Diagnosis not present

## 2016-11-11 DIAGNOSIS — Z79899 Other long term (current) drug therapy: Secondary | ICD-10-CM | POA: Diagnosis not present

## 2016-11-11 DIAGNOSIS — M5441 Lumbago with sciatica, right side: Secondary | ICD-10-CM | POA: Insufficient documentation

## 2016-11-11 DIAGNOSIS — I2510 Atherosclerotic heart disease of native coronary artery without angina pectoris: Secondary | ICD-10-CM | POA: Insufficient documentation

## 2016-11-11 DIAGNOSIS — R0602 Shortness of breath: Secondary | ICD-10-CM | POA: Diagnosis not present

## 2016-11-11 DIAGNOSIS — I10 Essential (primary) hypertension: Secondary | ICD-10-CM | POA: Diagnosis not present

## 2016-11-11 DIAGNOSIS — Z7982 Long term (current) use of aspirin: Secondary | ICD-10-CM | POA: Diagnosis not present

## 2016-11-11 DIAGNOSIS — R109 Unspecified abdominal pain: Secondary | ICD-10-CM | POA: Diagnosis present

## 2016-11-11 LAB — CBC
HCT: 35.7 % — ABNORMAL LOW (ref 36.0–46.0)
HEMOGLOBIN: 11.4 g/dL — AB (ref 12.0–15.0)
MCH: 27.3 pg (ref 26.0–34.0)
MCHC: 31.9 g/dL (ref 30.0–36.0)
MCV: 85.6 fL (ref 78.0–100.0)
Platelets: 863 10*3/uL — ABNORMAL HIGH (ref 150–400)
RBC: 4.17 MIL/uL (ref 3.87–5.11)
RDW: 14.7 % (ref 11.5–15.5)
WBC: 5.6 10*3/uL (ref 4.0–10.5)

## 2016-11-11 LAB — BASIC METABOLIC PANEL
ANION GAP: 10 (ref 5–15)
BUN: 8 mg/dL (ref 6–20)
CALCIUM: 9.4 mg/dL (ref 8.9–10.3)
CO2: 23 mmol/L (ref 22–32)
Chloride: 105 mmol/L (ref 101–111)
Creatinine, Ser: 0.89 mg/dL (ref 0.44–1.00)
Glucose, Bld: 105 mg/dL — ABNORMAL HIGH (ref 65–99)
Potassium: 3.2 mmol/L — ABNORMAL LOW (ref 3.5–5.1)
Sodium: 138 mmol/L (ref 135–145)

## 2016-11-11 LAB — TROPONIN I: Troponin I: <0.03 ng/mL (ref <0.03)

## 2016-11-11 MED ORDER — LORAZEPAM 1 MG PO TABS: 1.0000 mg | ORAL_TABLET | Freq: Three times a day (TID) | ORAL | 0 refills | Status: DC | PRN

## 2016-11-11 MED ORDER — FENTANYL CITRATE (PF) 100 MCG/2ML IJ SOLN
100.0000 ug | Freq: Once | INTRAMUSCULAR | Status: AC
  Administered 2016-11-11: 100 ug via INTRAVENOUS
  Filled 2016-11-11: qty 2

## 2016-11-11 MED ORDER — LORAZEPAM 2 MG/ML IJ SOLN
1.0000 mg | Freq: Once | INTRAMUSCULAR | Status: AC
  Administered 2016-11-11: 1 mg via INTRAVENOUS
  Filled 2016-11-11: qty 1

## 2016-11-11 MED ORDER — PREDNISONE 10 MG PO TABS: 20.0000 mg | ORAL_TABLET | Freq: Two times a day (BID) | ORAL | 0 refills | Status: DC

## 2016-11-11 MED ORDER — HYDROCODONE-ACETAMINOPHEN 5-325 MG PO TABS: 1.0000 | ORAL_TABLET | ORAL | 0 refills | Status: DC | PRN

## 2016-11-11 MED ORDER — ONDANSETRON HCL 4 MG/2ML IJ SOLN
4.0000 mg | Freq: Once | INTRAMUSCULAR | Status: AC
  Administered 2016-11-11: 4 mg via INTRAVENOUS

## 2016-11-11 NOTE — ED Provider Notes (Signed)
[facility]-EMERGENCY DEPT Provider Note   CSN: [id number] Arrival date & time: 11/11/16  1335     History   Chief Complaint Chief Complaint  Patient presents with  . Flank Pain    HPI Carolyn Gaines is a 51 y.o. female.  She presents for evaluation of right flank pain, present for 3 days, unrelenting and worsening.  She had a cardiac bypass, about 2-1/2 weeks ago, and had been doing well, until this pain started.  She denies cough, shortness of breath, fever, chills, nausea or vomiting.  She does not feel like she strained anything.  She has not had any urinary frequency, dysuria or hematuria.  No prior similar problem.  There are no other known modifying factors.  HPI  Past Medical History:  Diagnosis Date  . Anxiety   . Arthritis   . Depression   . Fibromyalgia   . MI (myocardial infarction) (HCC) 01/2016    Patient Active Problem List   Diagnosis Date Noted  . 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 Unstable angina (HCC)   . Fibromyalgia 10/20/2016  . Essential hypertension 07/18/2016  . Psoriasis 07/18/2016  . Precordial chest pain 07/03/2016  . Depression with anxiety 07/03/2016  . Normocytic anemia 07/03/2016  . History of acute inferior wall MI 02/19/2016  . Coronary artery disease involving native heart without angina pectoris 02/19/2016    Past Surgical History:  Procedure Laterality Date  . ABDOMINAL HYSTERECTOMY    . CARDIAC CATHETERIZATION N/A 02/19/2016   Procedure: Left Heart Cath and Coronary Angiography;  Surgeon: Yates Decamp, MD;  Location: [facility] INVASIVE CV LAB;  Service: Cardiovascular;  Laterality: N/A;  . CARDIAC CATHETERIZATION N/A 02/19/2016   Procedure: Coronary Stent Intervention;  Surgeon: Yates Decamp, MD;  Location: [facility] INVASIVE CV LAB;  Service: Cardiovascular;  Laterality: N/A;  . cesarian    . CORONARY ARTERY BYPASS GRAFT N/A 10/26/2016   Procedure: CORONARY ARTERY BYPASS GRAFTING (CABG), ON PUMP, TIMES THREE, USING LEFT INTERNAL MAMMARY ARTERY AND ENDOSCOPICALLY  HARVESTED RIGHT GREATER SAPHENOUS VEIN;  Surgeon: Alleen Borne, MD;  Location: [facility] OR;  Service: Open Heart Surgery;  Laterality: N/A;  LIMA-LAD SVG-RCA SVG-OM  . LEFT HEART CATH AND CORONARY ANGIOGRAPHY N/A 10/22/2016   Procedure: Left Heart Cath and Coronary Angiography;  Surgeon: Lennette Bihari, MD;  Location: [facility] INVASIVE CV LAB;  Service: Cardiovascular;  Laterality: N/A;  . TEE WITHOUT CARDIOVERSION N/A 10/26/2016   Procedure: TRANSESOPHAGEAL ECHOCARDIOGRAM (TEE);  Surgeon: Alleen Borne, MD;  Location: [facility] OR;  Service: Open Heart Surgery;  Laterality: N/A;    OB History    No data available       Home Medications    Prior to Admission medications   Medication Sig Start Date End Date Taking? Authorizing Provider  albuterol (PROVENTIL HFA;VENTOLIN HFA) 108 (90 Base) MCG/ACT inhaler Inhale 2 puffs into the lungs every 4 (four) hours as needed for wheezing or shortness of breath.   Yes Historical Provider, MD  aspirin EC 81 MG EC tablet Take 1 tablet (81 mg total) by mouth daily. 02/20/16  Yes Marcy Salvo, NP  carvedilol (COREG) 3.125 MG tablet Take 1 tablet (3.125 mg total) by mouth 2 (two) times daily with a meal. 09/19/16  Yes Dyann Kief, PA-C  escitalopram (LEXAPRO) 20 MG tablet Take 20 mg by mouth daily. 03/31/16  Yes Historical Provider, MD  guaiFENesin (MUCINEX) 600 MG 12 hr tablet Take 1 tablet (600 mg total) by mouth 2 (two) times daily as needed.  10/31/16  Yes Donielle Margaretann Loveless, PA-C  nitroGLYCERIN (NITROSTAT) 0.4 MG SL tablet PLACE 1 T UNDER THE TONGUE Q 5 MINUTES FOR 3 DOSES AS NEEDED FOR CHEST PAIN 08/29/16  Yes Historical Provider, MD  oxyCODONE (OXY IR/ROXICODONE) 5 MG immediate release tablet Take 5 mg by mouth every 4-6 hours PRN severe pain 10/31/16  Yes Donielle Margaretann Loveless, PA-C  rosuvastatin (CRESTOR) 40 MG tablet Take 1 tablet (40 mg total) by mouth daily at 6 PM. 10/31/16  Yes Donielle Margaretann Loveless, PA-C  ticagrelor (BRILINTA) 90 MG TABS tablet Take 1 tablet (90  mg total) by mouth 2 (two) times daily. 09/19/16  Yes Dyann Kief, PA-C  acetaminophen (TYLENOL) 325 MG tablet Take 2 tablets (650 mg total) by mouth every 6 (six) hours as needed for headache. Patient not taking: Reported on 11/11/2016 10/31/16   Ardelle Balls, PA-C  ferrous sulfate 325 (65 FE) MG tablet Take 1 tablet (325 mg total) by mouth daily with breakfast. For ONE MONTH then stop. Patient not taking: Reported on 11/11/2016 11/01/16   Ardelle Balls, PA-C  furosemide (LASIX) 40 MG tablet Take 1 tablet (40 mg total) by mouth daily. For one week then stop. Patient not taking: Reported on 11/11/2016 10/31/16   Donielle Margaretann Loveless, PA-C  HYDROcodone-acetaminophen (NORCO) 5-325 MG tablet Take 1 tablet by mouth every 4 (four) hours as needed for moderate pain. 11/11/16   Mancel Bale, MD  LORazepam (ATIVAN) 1 MG tablet Take 1 tablet (1 mg total) by mouth every 8 (eight) hours as needed for anxiety (muscle spasm). 11/11/16   Mancel Bale, MD  potassium chloride SA (K-DUR,KLOR-CON) 20 MEQ tablet Take 1 tablet (20 mEq total) by mouth daily. For one week then stop. Patient not taking: Reported on 11/11/2016 10/31/16   Donielle Margaretann Loveless, PA-C  predniSONE (DELTASONE) 10 MG tablet Take 2 tablets (20 mg total) by mouth 2 (two) times daily. 11/11/16   Mancel Bale, MD    Family History Family History  Problem Relation Age of Onset  . COPD Mother   . Arthritis Mother   . Heart disease Mother   . Psoriasis Father     Social History Social History  Substance Use Topics  . Smoking status: Former Smoker    Quit date: 03/21/2011  . Smokeless tobacco: Never Used  . Alcohol use No     Allergies   Bee venom; Adhesive [tape]; and Triple antibiotic [bacitracin-neomycin-polymyxin]   Review of Systems Review of Systems  All other systems reviewed and are negative.    Physical Exam Updated Vital Signs BP 101/62 (BP Location: Left Arm)   Pulse 70   Temp 98.9 F (37.2 C) (Oral)   Resp  18   Ht 5\' 4"  (1.626 m)   Wt 170 lb (77.1 kg)   SpO2 97%   BMI 29.18 kg/m   Physical Exam  Constitutional: She is oriented to person, place, and time. She appears well-developed and well-nourished. She appears distressed (She is uncomfortable).  HENT:  Head: Normocephalic and atraumatic.  Eyes: Conjunctivae and EOM are normal. Pupils are equal, round, and reactive to light.  Neck: Normal range of motion and phonation normal. Neck supple.  Cardiovascular: Normal rate and regular rhythm.   Pulmonary/Chest: Effort normal and breath sounds normal. She exhibits tenderness (Right posterior chest wall, without crepitation or deformity.  No overlying skin changes.).  Abdominal: Soft. She exhibits no distension. There is no tenderness. There is no guarding.  Musculoskeletal: Normal range of motion.  Neurological: She is alert and oriented to person, place, and time. She exhibits normal muscle tone.  Skin: Skin is warm and dry.  Psychiatric: She has a normal mood and affect. Her behavior is normal. Judgment and thought content normal.  Nursing note and vitals reviewed.    ED Treatments / Results  Labs (all labs ordered are listed, but only abnormal results are displayed) Labs Reviewed  BASIC METABOLIC PANEL - Abnormal; Notable for the following:       Result Value   Potassium 3.2 (*)    Glucose, Bld 105 (*)    All other components within normal limits  CBC - Abnormal; Notable for the following:    Hemoglobin 11.4 (*)    HCT 35.7 (*)    Platelets 863 (*)    All other components within normal limits  TROPONIN I    EKG  EKG Interpretation  Date/Time:  Sunday November 11 2016 14:12:07 EDT Ventricular Rate:  87 PR Interval:  142 QRS Duration: 66 QT Interval:  390 QTC Calculation: 469 R Axis:   85 Text Interpretation:  Normal sinus rhythm Possible Left atrial enlargement ST & T wave abnormality, consider inferior ischemia ST & T wave abnormality, consider anterolateral ischemia  Prolonged QT Abnormal ECG Confirmed by Effie Shy  MD, Verdean Murin [phone]) on 11/11/2016 2:28:45 PM       Radiology Dg Chest 2 View  Result Date: 11/11/2016 CLINICAL DATA:  Shortness of breath.  Recent median sternotomy. EXAM: CHEST  2 VIEW COMPARISON:  10/29/2016 FINDINGS: Prior median sternotomy. Midline trachea. Mild cardiomegaly. Small right pleural effusion is slightly decreased in size, given differences in technique. Trace left pleural fluid persists. No pneumothorax. No congestive failure. Improved bibasilar aeration with right greater than left base airspace disease. IMPRESSION: Overall improved aeration, with decreased small right and trace left pleural effusions. Decreased bibasilar airspace disease, likely atelectasis. Cardiomegaly without congestive failure. Electronically Signed   By: Jeronimo Greaves M.D.   On: 11/11/2016 15:09    Procedures Procedures (including critical care time)  Medications Ordered in ED Medications  fentaNYL (SUBLIMAZE) injection 100 mcg (100 mcg Intravenous Given 11/11/16 1624)  ondansetron (ZOFRAN) injection 4 mg (4 mg Intravenous Given 11/11/16 1624)  fentaNYL (SUBLIMAZE) injection 100 mcg (100 mcg Intravenous Given 11/11/16 1828)  LORazepam (ATIVAN) injection 1 mg (1 mg Intravenous Given 11/11/16 1828)     Initial Impression / Assessment and Plan / ED Course  I have reviewed the triage vital signs and the nursing notes.  Pertinent labs & imaging results that were available during my care of the patient were reviewed by me and considered in my medical decision making (see chart for details).     Medications  fentaNYL (SUBLIMAZE) injection 100 mcg (100 mcg Intravenous Given 11/11/16 1624)  ondansetron (ZOFRAN) injection 4 mg (4 mg Intravenous Given 11/11/16 1624)  fentaNYL (SUBLIMAZE) injection 100 mcg (100 mcg Intravenous Given 11/11/16 1828)  LORazepam (ATIVAN) injection 1 mg (1 mg Intravenous Given 11/11/16 1828)    Patient Vitals for the past 24 hrs:  BP  Temp Temp src Pulse Resp SpO2 Height Weight  11/11/16 1909 101/62 98.9 F (37.2 C) Oral 70 18 97 % - -  11/11/16 1611 102/67 - - 79 - 94 % - -  11/11/16 1408 104/72 98.6 F (37 C) Oral 90 18 95 % 5\' 4"  (1.626 m) 170 lb (77.1 kg)    At discharge- reevaluation with update and discussion. After initial assessment and treatment, an updated evaluation reveals she is  more comfortable.  At this time she has palpable tenderness in the right lower back, and right buttock.  She states the pain is now radiating to her right knee.  She also states that she has fibromyalgia, and this feels a bit like a fibromyalgia exacerbation.  Findings discussed with patient and all questions answered. Rhealyn Cullen L    Final Clinical Impressions(s) / ED Diagnoses   Final diagnoses:  Acute right-sided low back pain with right-sided sciatica   Nonspecific musculoskeletal pain.  Doubt postoperative, CABG, complication.  Doubt intra-abdominal source.  Doubt discitis, cauda equina syndrome or hemodynamic instability.  Nursing Notes Reviewed/ Care Coordinated Applicable Imaging Reviewed Interpretation of Laboratory Data incorporated into ED treatment  The patient appears reasonably screened and/or stabilized for discharge and I doubt any other medical condition or other [facility] requiring further screening, evaluation, or treatment in the ED at this time prior to discharge.  Plan: Home Medications-continue usual medications; Home Treatments-rest, heat; return here if the recommended treatment, does not improve the symptoms; Recommended follow up-PCP checkup in 1 week and as needed.   New Prescriptions Discharge Medication List as of 11/11/2016  7:02 PM    START taking these medications   Details  HYDROcodone-acetaminophen (NORCO) 5-325 MG tablet Take 1 tablet by mouth every 4 (four) hours as needed for moderate pain., Starting Sun 11/11/2016, Print    LORazepam (ATIVAN) 1 MG tablet Take 1 tablet (1 mg total) by mouth  every 8 (eight) hours as needed for anxiety (muscle spasm)., Starting Sun 11/11/2016, Print    predniSONE (DELTASONE) 10 MG tablet Take 2 tablets (20 mg total) by mouth 2 (two) times daily., Starting Sun 11/11/2016, Print         Mancel Bale, MD 11/12/16 1345

## 2016-11-11 NOTE — Discharge Instructions (Signed)
Use heat on the sore area 3 or 4 times a day  Try gently stretching her back  See your doctor for a checkup as needed

## 2016-11-11 NOTE — ED Triage Notes (Addendum)
Patient c/o right flank pain that radiates into right back. Per patient started approx 3 days. Patient c/o constipation (reports last BM this morning with no blood noted), dysuria, nausea, and cough with bloody sputum. Patient also states unable to lay on right side without increased pain. Patient recently had open heart surgery on 3/30 and states having shortness of breath with the use of 2 liters of oxygen at home. Patient states mid-sternal soreness from surgery. Patient taking oxycodone and tramodol with no relief.

## 2016-11-13 ENCOUNTER — Encounter: Payer: Self-pay | Admitting: Adult Health

## 2016-11-13 ENCOUNTER — Encounter: Payer: Self-pay | Admitting: *Deleted

## 2016-11-13 ENCOUNTER — Ambulatory Visit (INDEPENDENT_AMBULATORY_CARE_PROVIDER_SITE_OTHER): Payer: BLUE CROSS/BLUE SHIELD | Admitting: Adult Health

## 2016-11-13 VITALS — BP 106/70 | HR 79 | Ht 64.0 in | Wt 158.0 lb

## 2016-11-13 DIAGNOSIS — E7800 Pure hypercholesterolemia, unspecified: Secondary | ICD-10-CM | POA: Diagnosis not present

## 2016-11-13 DIAGNOSIS — I10 Essential (primary) hypertension: Secondary | ICD-10-CM | POA: Diagnosis not present

## 2016-11-13 DIAGNOSIS — I2510 Atherosclerotic heart disease of native coronary artery without angina pectoris: Secondary | ICD-10-CM

## 2016-11-13 MED ORDER — POTASSIUM CHLORIDE CRYS ER 20 MEQ PO TBCR: 20.0000 meq | EXTENDED_RELEASE_TABLET | Freq: Every day | ORAL | 0 refills | Status: DC

## 2016-11-13 NOTE — Progress Notes (Signed)
Name: Carolyn Gaines    DOB: [birth year]  Age: 51 y.o.  MR#: [id number]       PCP:  Fredirick Maudlin, MD      Insurance: Payor: BLUE CROSS BLUE SHIELD / Plan: BCBS OTHER / Product Type: *No Product type* /   CC:   No chief complaint on file.   VS Vitals:   11/13/16 1343  BP: 106/70  Pulse: 79  SpO2: 97%  Weight: 158 lb (71.7 kg)  Height: 5\' 4"  (1.626 m)    Weights Current Weight  11/13/16 158 lb (71.7 kg)  11/11/16 170 lb (77.1 kg)  10/31/16 168 lb (76.2 kg)    Blood Pressure  BP Readings from Last 3 Encounters:  11/13/16 106/70  11/11/16 101/62  10/31/16 100/60     Admit date:  (Not on file) Last encounter with RMR:  Visit date not found   Allergy Bee venom; Adhesive [tape]; and Triple antibiotic [bacitracin-neomycin-polymyxin]  Current Outpatient Prescriptions  Medication Sig Dispense Refill  . acetaminophen (TYLENOL) 325 MG tablet Take 2 tablets (650 mg total) by mouth every 6 (six) hours as needed for headache.    . albuterol (PROVENTIL HFA;VENTOLIN HFA) 108 (90 Base) MCG/ACT inhaler Inhale 2 puffs into the lungs every 4 (four) hours as needed for wheezing or shortness of breath.    Marland Kitchen aspirin EC 81 MG EC tablet Take 1 tablet (81 mg total) by mouth daily. 30 tablet 11  . carvedilol (COREG) 3.125 MG tablet Take 1 tablet (3.125 mg total) by mouth 2 (two) times daily with a meal. 60 tablet 6  . escitalopram (LEXAPRO) 20 MG tablet Take 20 mg by mouth daily.  1  . guaiFENesin (MUCINEX) 600 MG 12 hr tablet Take 1 tablet (600 mg total) by mouth 2 (two) times daily as needed.    Marland Kitchen HYDROcodone-acetaminophen (NORCO) 5-325 MG tablet Take 1 tablet by mouth every 4 (four) hours as needed for moderate pain. 20 tablet 0  . LORazepam (ATIVAN) 1 MG tablet Take 1 tablet (1 mg total) by mouth every 8 (eight) hours as needed for anxiety (muscle spasm). 15 tablet 0  . nitroGLYCERIN (NITROSTAT) 0.4 MG SL tablet PLACE 1 T UNDER THE TONGUE Q 5 MINUTES FOR 3 DOSES AS NEEDED FOR CHEST PAIN  1  .  oxyCODONE (OXY IR/ROXICODONE) 5 MG immediate release tablet Take 5 mg by mouth every 4-6 hours PRN severe pain 30 tablet 0  . predniSONE (DELTASONE) 10 MG tablet Take 2 tablets (20 mg total) by mouth 2 (two) times daily. 10 tablet 0  . rosuvastatin (CRESTOR) 40 MG tablet Take 1 tablet (40 mg total) by mouth daily at 6 PM. 30 tablet 1  . ticagrelor (BRILINTA) 90 MG TABS tablet Take 1 tablet (90 mg total) by mouth 2 (two) times daily. 60 tablet 6   No current facility-administered medications for this visit.     Discontinued Meds:    Medications Discontinued During This Encounter  Medication Reason  . ferrous sulfate 325 (65 FE) MG tablet Error  . furosemide (LASIX) 40 MG tablet Error  . potassium chloride SA (K-DUR,KLOR-CON) 20 MEQ tablet Error    Patient Active Problem List   Diagnosis Date Noted  . 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 Unstable angina (HCC)   . Fibromyalgia 10/20/2016  . Essential hypertension 07/18/2016  . Psoriasis 07/18/2016  . Precordial chest pain 07/03/2016  . Depression with anxiety 07/03/2016  . Normocytic anemia 07/03/2016  . History of acute inferior wall  MI 02/19/2016  . Coronary artery disease involving native heart without angina pectoris 02/19/2016    LABS    Component Value Date/Time   NA 138 11/11/2016 1552   NA 136 10/28/2016 0312   NA 138 10/27/2016 1536   K 3.2 (L) 11/11/2016 1552   K 3.9 10/28/2016 0312   K 4.2 10/27/2016 1536   CL 105 11/11/2016 1552   CL 106 10/28/2016 0312   CL 103 10/27/2016 1536   CO2 23 11/11/2016 1552   CO2 27 10/28/2016 0312   CO2 22 10/27/2016 0327   GLUCOSE 105 (H) 11/11/2016 1552   GLUCOSE 116 (H) 10/28/2016 0312   GLUCOSE 125 (H) 10/27/2016 1536   BUN 8 11/11/2016 1552   BUN 15 10/28/2016 0312   BUN 11 10/27/2016 1536   CREATININE 0.89 11/11/2016 1552   CREATININE 0.96 10/28/2016 0312   CREATININE 0.80 10/27/2016 1536   CALCIUM 9.4 11/11/2016 1552   CALCIUM 8.3 (L) 10/28/2016 0312   CALCIUM 8.1 (L)  10/27/2016 0327   GFRNONAA >60 11/11/2016 1552   GFRNONAA >60 10/28/2016 0312   GFRNONAA >60 10/27/2016 1530   GFRAA >60 11/11/2016 1552   GFRAA >60 10/28/2016 0312   GFRAA >60 10/27/2016 1530   CMP     Component Value Date/Time   NA 138 11/11/2016 1552   K 3.2 (L) 11/11/2016 1552   CL 105 11/11/2016 1552   CO2 23 11/11/2016 1552   GLUCOSE 105 (H) 11/11/2016 1552   BUN 8 11/11/2016 1552   CREATININE 0.89 11/11/2016 1552   CALCIUM 9.4 11/11/2016 1552   PROT 6.5 02/19/2016 2017   ALBUMIN 3.3 (L) 02/19/2016 2017   AST 24 02/19/2016 2017   ALT 14 02/19/2016 2017   ALKPHOS 69 02/19/2016 2017   BILITOT 0.3 02/19/2016 2017   GFRNONAA >60 11/11/2016 1552   GFRAA >60 11/11/2016 1552       Component Value Date/Time   WBC 5.6 11/11/2016 1552   WBC 6.2 10/29/2016 0205   WBC 8.0 10/28/2016 0312   HGB 11.4 (L) 11/11/2016 1552   HGB 8.3 (L) 10/29/2016 0205   HGB 8.7 (L) 10/28/2016 0312   HCT 35.7 (L) 11/11/2016 1552   HCT 26.2 (L) 10/29/2016 0205   HCT 27.4 (L) 10/28/2016 0312   MCV 85.6 11/11/2016 1552   MCV 89.1 10/29/2016 0205   MCV 90.1 10/28/2016 0312    Lipid Panel     Component Value Date/Time   CHOL 119 10/25/2016 0440   TRIG 177 (H) 10/25/2016 0440   HDL 45 10/25/2016 0440   CHOLHDL 2.6 10/25/2016 0440   VLDL 35 10/25/2016 0440   LDLCALC 39 10/25/2016 0440    ABG    Component Value Date/Time   PHART 7.327 (L) 10/26/2016 1912   PCO2ART 47.9 10/26/2016 1912   PO2ART 123.0 (H) 10/26/2016 1912   HCO3 25.0 10/26/2016 1912   TCO2 26 10/27/2016 1536   ACIDBASEDEF 1.0 10/26/2016 1912   O2SAT 98.0 10/26/2016 1912     Lab Results  Component Value Date   TSH 0.905 02/20/2016   BNP (last 3 results)  Recent Labs  07/03/16 1535  BNP 45.0    ProBNP (last 3 results) No results for input(s): PROBNP in the last 8760 hours.  Cardiac Panel (last 3 results)  Recent Labs  11/11/16 1552  TROPONINI <0.03    Iron/TIBC/Ferritin/ %Sat    Component Value  Date/Time   IRON 54 10/20/2016 2256   TIBC 220 (L) 10/20/2016 2256   FERRITIN  38 10/20/2016 2256   IRONPCTSAT 25 10/20/2016 2256     EKG Orders placed or performed during the hospital encounter of 11/11/16  . EKG 12-Lead  . EKG 12-Lead  . ED EKG within 10 minutes  . ED EKG within 10 minutes  . EKG     Prior Assessment and Plan Problem List as of 11/13/2016 Reviewed: 10/20/2016  4:40 PM by Pincus Badder, MD     Cardiovascular and Mediastinum   Coronary artery disease involving native heart without angina pectoris   Essential hypertension   Unstable angina (HCC)     Musculoskeletal and Integument   Psoriasis     Other   History of acute inferior wall MI   Fibromyalgia   Precordial chest pain   Depression with anxiety   Normocytic anemia   S/P CABG x 3       Imaging: Dg Chest 2 View  Result Date: 11/11/2016 CLINICAL DATA:  Shortness of breath.  Recent median sternotomy. EXAM: CHEST  2 VIEW COMPARISON:  10/29/2016 FINDINGS: Prior median sternotomy. Midline trachea. Mild cardiomegaly. Small right pleural effusion is slightly decreased in size, given differences in technique. Trace left pleural fluid persists. No pneumothorax. No congestive failure. Improved bibasilar aeration with right greater than left base airspace disease. IMPRESSION: Overall improved aeration, with decreased small right and trace left pleural effusions. Decreased bibasilar airspace disease, likely atelectasis. Cardiomegaly without congestive failure. Electronically Signed   By: Jeronimo Greaves M.D.   On: 11/11/2016 15:09   Dg Chest 2 View  Result Date: 10/29/2016 CLINICAL DATA:  Pleural effusions EXAM: CHEST  2 VIEW COMPARISON:  October 28, 2016. FINDINGS: There are pleural effusions bilaterally with patchy bibasilar atelectasis. Central catheter has been removed. No pneumothorax. Temporary pacemaker wires remain attached to the right heart. Heart size and pulmonary vascularity are normal. No adenopathy. No bone  lesions. Patient is status post coronary artery bypass grafting. IMPRESSION: Persistent pleural effusions with bibasilar atelectasis. Lungs elsewhere clear. Stable cardiac silhouette. No evident pneumothorax. Electronically Signed   By: Bretta Bang III M.D.   On: 10/29/2016 08:07   Dg Chest 2 View  Result Date: 10/20/2016 CLINICAL DATA:  Sudden onset of chest pain starting today at work, history of MI EXAM: CHEST  2 VIEW COMPARISON:  10/12/2016 FINDINGS: Cardiomediastinal silhouette is stable. No infiltrate or pleural effusion. No pulmonary edema. Bony thorax is unremarkable. IMPRESSION: No active cardiopulmonary disease. Electronically Signed   By: Natasha Mead M.D.   On: 10/20/2016 14:50   Dg Chest Port 1 View  Result Date: 10/28/2016 CLINICAL DATA:  Status post CABG surgery.  Cough and chest soreness. EXAM: PORTABLE CHEST 1 VIEW COMPARISON:  10/27/2016 FINDINGS: Since the previous exam, the Swan-Ganz catheter, mediastinal tube and left chest tube have been removed. The right internal jugular introducer sheath remains in place, well positioned. There is persistent lung base opacity on the left with mild increased in lung base opacity on the right, consistent with a combination of atelectasis and pleural effusions. No evidence of pulmonary edema. No pneumothorax. There is no mediastinal widening. IMPRESSION: 1. Lung base opacity, increased on the right and stable on the left, consistent with a combination of atelectasis and small pleural effusions. 2. No pulmonary edema, mediastinal widening or pneumothorax. No evidence of an operative complication. 3. All lines and tubes have been removed with the exception of the right internal jugular introducer sheath. Electronically Signed   By: Amie Portland M.D.   On: 10/28/2016 07:22  Dg Chest Port 1 View  Result Date: 10/27/2016 CLINICAL DATA:  Status post CABG. EXAM: PORTABLE CHEST 1 VIEW COMPARISON:  October 26, 2016 FINDINGS: The PA catheter terminates  near the pulmonary outflow tract, just to the left of midline. Placement is unchanged. The ET tube has been removed. Left-sided chest tubes remain. Mediastinal tube remains. Mild increased opacity in left base, likely atelectasis. Overall, lung volumes have decreased in the interval. IMPRESSION: 1. Interval removal of ET tube. Decrease lung volumes in the interval with probable atelectasis in the left base. 2. Stable support apparatus other than removal of the ET and enteric tubes. Electronically Signed   By: Gerome Sam III M.D   On: 10/27/2016 07:31   Dg Chest Port 1 View  Result Date: 10/26/2016 CLINICAL DATA:  Postop CABG EXAM: PORTABLE CHEST 1 VIEW COMPARISON:  10/24/2016 FINDINGS: Changes of CABG. Endotracheal tube is 6 cm above the carina. Left chest tube is in place without pneumothorax. Swan-Ganz catheter is in the main pulmonary artery. NG tube enters the stomach. Mild bibasilar atelectasis, left greater than right. No effusions. IMPRESSION: Postoperative changes with support devices in expected position. No pneumothorax. Bibasilar atelectasis. Electronically Signed   By: Charlett Nose M.D.   On: 10/26/2016 13:06   Dg Chest Port 1 View  Result Date: 10/24/2016 CLINICAL DATA:  51 year old female shortness of breath today, sudden onset chest pain 4 days ago. Former smoker. Initial encounter. EXAM: PORTABLE CHEST 1 VIEW COMPARISON:  10/20/2016 and earlier. FINDINGS: Portable AP upright view at 1122 hours. Lung volumes are stable and low normal. Stable lung parenchyma. Allowing for portable technique the lungs are clear. No pneumothorax or pleural effusion. Visualized tracheal air column is within normal limits. Normal cardiac size and mediastinal contours. IMPRESSION: No acute cardiopulmonary abnormality. Electronically Signed   By: Odessa Fleming M.D.   On: 10/24/2016 11:31

## 2016-11-13 NOTE — Patient Instructions (Signed)
Your physician recommends that you schedule a follow-up appointment with Dr. Wyline Mood  Your physician has recommended you make the following change in your medication:   Start Potassium 20 mEq Daily for the next 3 Days  You have been given a letter for work.   Your physician recommends that you return for lab work in: 1 Week (11/20/16)  If you need a refill on your cardiac medications before your next appointment, please call your pharmacy.  Thank you for choosing [facility]!

## 2016-11-13 NOTE — Progress Notes (Signed)
Cardiology Office Note   Date:  11/13/2016   ID:  Carolyn Gaines, DOB Nov 02, 1965, MRN [id number]  PCP:  Fredirick Maudlin, MD  Cardiologist:  Arlington Calix, NP   Chief Complaint  Patient presents with  . Hospitalization Follow-up    CABG      History of Present Illness: Carolyn Gaines is a 51 y.o. female who presents for ongoing assessment and management of CAD, status post drug-eluting stent to the right coronary artery, chronic depression, fibromyalgia, osteoarthritis, with chronic chest pain. The patient was last seen on consultation during hospitalization on on 10/21/2016, after having chest pain when lifting heavy boxes at work. Repeat cardiac catheterization was ordered to definitively evaluate for ongoing CAD and evaluate for RCA and possible FFR of LAD.  Cardiac Cath 10/22/2016 Conclusion     Mid LAD lesion, 50 %stenosed.  Ost LM lesion, 85 %stenosed.  Mid RCA-1 lesion, 50 %stenosed.  Mid RCA-2 lesion, 0 %stenosed.  A drug eluting .  Prox RCA lesion, 20 %stenosed.   Severe native CAD with 80-90% ostial stenosis of the left main coronary artery which did not improve following IC nitroglycerin administration; 50% mid LAD stenoses; normal left circumflex vessel; and patent mid RCA stent with 45-50% smooth eccentric stenosis proximal to the stented segment and 20% proximal irregularity.  Mild  residual LV dysfunction with an ejection fraction of 45-50% with mild mid inferior hypocontractility.   The patient was referred to cardiothoracic surgery for CABG. This was completed by Dr. Laneta Simmers on 10/26/2016, with LIMA to LAD, SVG to RCA, and SVG to OM. The patient was discharged on 10/29/2016. She continued to have complaints of chest pain which was reproducible with palpation, and she was unable to tell whether it was fibromyalgia pain versus postoperative pain. She was restarted on Brilinta, enteric-coated aspirin was reduced to 81 mg daily. She was found to have  postoperative anemia, with a hemoglobin of 8.3 and hematocrit of 26.2. She was started on oral Ferol sulfate. She was referred to cardiac rehabilitation.  She came to the emergency room on 11/11/2016 for complaints of right flank pain which was present for 3 days. She was treated with fentanyl and Ativan. She was released to follow up with PCP. It is noted that the patient also has a great deal of anxiety and depression.  She comes today very emotional and tearful. She continues to have complaints of chest discomfort and generalized fatigue. She is medically compliant.    Past Medical History:  Diagnosis Date  . Anxiety   . Arthritis   . Depression   . Fibromyalgia   . MI (myocardial infarction) (HCC) 01/2016    Past Surgical History:  Procedure Laterality Date  . ABDOMINAL HYSTERECTOMY    . CARDIAC CATHETERIZATION N/A 02/19/2016   Procedure: Left Heart Cath and Coronary Angiography;  Surgeon: Yates Decamp, MD;  Location[facility] INVASIVE CV LAB;  Service: Cardiovascular;  Laterality: N/A;  . CARDIAC CATHETERIZATION N/A 02/19/2016   Procedure: Coronary Stent Intervention;  Surgeon: Yates Decamp, MD;  Location: [facility] INVASIVE CV LAB;  Service: Cardiovascular;  Laterality: N/A;  . cesarian    . CORONARY ARTERY BYPASS GRAFT N/A 10/26/2016   Procedure: CORONARY ARTERY BYPASS GRAFTING (CABG), ON PUMP, TIMES THREE, USING LEFT INTERNAL MAMMARY ARTERY AND ENDOSCOPICALLY HARVESTED RIGHT GREATER SAPHENOUS VEIN;  Surgeon: Alleen Borne, MD;  Location: [facility] OR;  Service: Open Heart Surgery;  Laterality: N/A;  LIMA-LAD SVG-RCA SVG-OM  . LEFT HEART CATH AND CORONARY ANGIOGRAPHY  N/A 10/22/2016   Procedure: Left Heart Cath and Coronary Angiography;  Surgeon: Lennette Bihari, MD;  Location: [facility] INVASIVE CV LAB;  Service: Cardiovascular;  Laterality: N/A;  . TEE WITHOUT CARDIOVERSION N/A 10/26/2016   Procedure: TRANSESOPHAGEAL ECHOCARDIOGRAM (TEE);  Surgeon: Alleen Borne, MD;  Location: [facility] OR;  Service: Open Heart Surgery;   Laterality: N/A;     Current Outpatient Prescriptions  Medication Sig Dispense Refill  . acetaminophen (TYLENOL) 325 MG tablet Take 2 tablets (650 mg total) by mouth every 6 (six) hours as needed for headache.    . albuterol (PROVENTIL HFA;VENTOLIN HFA) 108 (90 Base) MCG/ACT inhaler Inhale 2 puffs into the lungs every 4 (four) hours as needed for wheezing or shortness of breath.    Marland Kitchen aspirin EC 81 MG EC tablet Take 1 tablet (81 mg total) by mouth daily. 30 tablet 11  . carvedilol (COREG) 3.125 MG tablet Take 1 tablet (3.125 mg total) by mouth 2 (two) times daily with a meal. 60 tablet 6  . escitalopram (LEXAPRO) 20 MG tablet Take 20 mg by mouth daily.  1  . guaiFENesin (MUCINEX) 600 MG 12 hr tablet Take 1 tablet (600 mg total) by mouth 2 (two) times daily as needed.    Marland Kitchen HYDROcodone-acetaminophen (NORCO) 5-325 MG tablet Take 1 tablet by mouth every 4 (four) hours as needed for moderate pain. 20 tablet 0  . LORazepam (ATIVAN) 1 MG tablet Take 1 tablet (1 mg total) by mouth every 8 (eight) hours as needed for anxiety (muscle spasm). 15 tablet 0  . nitroGLYCERIN (NITROSTAT) 0.4 MG SL tablet PLACE 1 T UNDER THE TONGUE Q 5 MINUTES FOR 3 DOSES AS NEEDED FOR CHEST PAIN  1  . oxyCODONE (OXY IR/ROXICODONE) 5 MG immediate release tablet Take 5 mg by mouth every 4-6 hours PRN severe pain 30 tablet 0  . predniSONE (DELTASONE) 10 MG tablet Take 2 tablets (20 mg total) by mouth 2 (two) times daily. 10 tablet 0  . rosuvastatin (CRESTOR) 40 MG tablet Take 1 tablet (40 mg total) by mouth daily at 6 PM. 30 tablet 1  . ticagrelor (BRILINTA) 90 MG TABS tablet Take 1 tablet (90 mg total) by mouth 2 (two) times daily. 60 tablet 6  . potassium chloride SA (K-DUR,KLOR-CON) 20 MEQ tablet Take 1 tablet (20 mEq total) by mouth daily. 3 tablet 0   No current facility-administered medications for this visit.     Allergies:   Bee venom; Adhesive [tape]; and Triple antibiotic [bacitracin-neomycin-polymyxin]    Social  History:  The patient  reports that she quit smoking about 5 years ago. She has never used smokeless tobacco. She reports that she does not drink alcohol or use drugs.   Family History:  [name] family history includes Arthritis in her mother; COPD in her mother; Heart disease in her mother; Psoriasis in her father.    ROS: All other systems are reviewed and negative. Unless otherwise mentioned in H&P    PHYSICAL EXAM: VS:  BP 106/70   Pulse 79   Ht 5\' 4"  (1.626 m)   Wt 158 lb (71.7 kg)   SpO2 97%   BMI 27.12 kg/m  , BMI Body mass index is 27.12 kg/m. GEN: Well nourished, well developed, in no acute distress  HEENT: normal  Neck: no JVD, carotid bruits, or masses Cardiac: RRR; no murmurs, rubs, or gallops,no edema  Respiratory:  Mild bibasilar crackles. No wheezes. Some soreness with deep breathing.  GI: soft, nontender, nondistended, +  BS MS: no deformity or atrophy Sternotomy incision is well healed. SVG harvest sites are well healed.  Skin: warm and dry, no rash Neuro:  Strength and sensation are intact Psych: Depressed and anxious. Tearful   Recent Labs: 02/19/2016: ALT 14 02/20/2016: TSH 0.905 07/03/2016: B Natriuretic Peptide 45.0 10/27/2016: Magnesium 2.1 11/11/2016: BUN 8; Creatinine, Ser 0.89; Hemoglobin 11.4; Platelets 863; Potassium 3.2; Sodium 138    Lipid Panel    Component Value Date/Time   CHOL 119 10/25/2016 0440   TRIG 177 (H) 10/25/2016 0440   HDL 45 10/25/2016 0440   CHOLHDL 2.6 10/25/2016 0440   VLDL 35 10/25/2016 0440   LDLCALC 39 10/25/2016 0440      Wt Readings from Last 3 Encounters:  11/13/16 158 lb (71.7 kg)  11/11/16 170 lb (77.1 kg)  10/31/16 168 lb (76.2 kg)      Other studies Reviewed: Echocardiogram 2016/11/06 Left ventricle: The cavity size was normal. Wall thickness was   normal. Systolic function was normal. The estimated ejection   fraction was in the range of 55% to 60%. Wall motion was normal;   there were no regional  wall motion abnormalities. Left   ventricular diastolic function parameters were normal. - Aortic valve: Valve area (VTI): 2.47 cm^2. Valve area (Vmax):   2.22 cm^2. Valve area (Vmean): 2.54 cm^2. - Atrial septum: No defect or patent foramen ovale was identified. - Inferior vena cava: The vessel was dilated. The respirophasic   diameter changes were blunted (< 50%), consistent with elevated   central venous pressure.   ASSESSMENT AND PLAN:  1. CAD: S/P  LIMA to LAD, SVG to RCA, and SVG to OM. She remains sore but is slowly regaining her strength. She is medcially complaint. I have advised her to continue to take deep breaths and cough with pillow for splinting. She will continue on BB , Brilinta in the setting of RCA stent. When cleared by CVTS, she will be referred to cardiac rehab.   2.  Hypertension: BP is hypotensive today. She feels weak but has not had dizziness with this. She took lasix and potassium as directed. When seen in th ER she was found to be hypokalemic.  This was not repleted. I will give her potassium replacement to keep potassium 4.0 in cardiac patients. Will see her on close follow up for BP evaluation. She is still taking pain medication which may be playing a role in this.   3. Hypercholesterolemia: She will remain on statin. Follow up labs in 3 months.  4. Anxiety and Depression: She is tearful today when describing her health status. She will need to follow up with PCP for ongoing management.   Current medicines are reviewed at length with the patient today.    Labs/ tests ordered today include: BMET  Orders Placed This Encounter  Procedures  . Basic Metabolic Panel (BMET)   I have spent greater than 30 minutes with this patient answering multiple questions.   Disposition:   FU with one month   Signed, Joni Reining, NP  11/13/2016 2:40 PM    [facility] [address] [place], [place] [address] Phone: [phone]; Fax: [phone]

## 2016-11-14 ENCOUNTER — Other Ambulatory Visit: Payer: Self-pay | Admitting: Adult Health

## 2016-11-20 ENCOUNTER — Other Ambulatory Visit (HOSPITAL_COMMUNITY)
Admission: RE | Admit: 2016-11-20 | Discharge: 2016-11-20 | Disposition: A | Discharge status: Home / Self Care | Payer: BLUE CROSS/BLUE SHIELD | Source: Ambulatory Visit | Attending: Adult Health | Admitting: Adult Health

## 2016-11-20 ENCOUNTER — Telehealth: Payer: Self-pay | Admitting: *Deleted

## 2016-11-20 DIAGNOSIS — I10 Essential (primary) hypertension: Secondary | ICD-10-CM | POA: Insufficient documentation

## 2016-11-20 DIAGNOSIS — E876 Hypokalemia: Secondary | ICD-10-CM

## 2016-11-20 LAB — BASIC METABOLIC PANEL
Anion gap: 7 (ref 5–15)
BUN: 12 mg/dL (ref 6–20)
CALCIUM: 9.1 mg/dL (ref 8.9–10.3)
CO2: 24 mmol/L (ref 22–32)
CREATININE: 0.95 mg/dL (ref 0.44–1.00)
Chloride: 107 mmol/L (ref 101–111)
GFR calc non Af Amer: >60 mL/min (ref >60)
Glucose, Bld: 99 mg/dL (ref 65–99)
Potassium: 3.1 mmol/L — ABNORMAL LOW (ref 3.5–5.1)
Sodium: 138 mmol/L (ref 135–145)

## 2016-11-20 MED ORDER — POTASSIUM CHLORIDE CRYS ER 20 MEQ PO TBCR: 40.0000 meq | EXTENDED_RELEASE_TABLET | Freq: Every day | ORAL | 3 refills | Status: DC

## 2016-11-20 NOTE — Telephone Encounter (Signed)
Called patient with test results. No answer. Left message to call back. Orders placed for Potassium and Bmet

## 2016-11-20 NOTE — Telephone Encounter (Signed)
-----   Message from Jodelle Gross, NP sent at 11/20/2016 10:00 AM EDT ----- Potassium too low. Should keep it at 4.0 for cardiac patients. Please increase potassium to 40 mEq BID for one day and then 40 mEq daily thereafter. Repeat BMET in 3 days with a magnesium level please.

## 2016-11-22 ENCOUNTER — Telehealth: Payer: Self-pay | Admitting: *Deleted

## 2016-11-22 DIAGNOSIS — I10 Essential (primary) hypertension: Secondary | ICD-10-CM

## 2016-11-22 NOTE — Telephone Encounter (Signed)
-----   Message from Kathryn M Lawrence, NP sent at 11/20/2016 10:00 AM EDT ----- Potassium too low. Should keep it at 4.0 for cardiac patients. Please increase potassium to 40 mEq BID for one day and then 40 mEq daily thereafter. Repeat BMET in 3 days with a magnesium level please. 

## 2016-11-26 ENCOUNTER — Other Ambulatory Visit (HOSPITAL_COMMUNITY)
Admission: RE | Admit: 2016-11-26 | Discharge: 2016-11-26 | Disposition: A | Discharge status: Home / Self Care | Payer: BLUE CROSS/BLUE SHIELD | Source: Ambulatory Visit | Attending: Adult Health | Admitting: Adult Health

## 2016-11-26 ENCOUNTER — Telehealth: Payer: Self-pay | Admitting: *Deleted

## 2016-11-26 DIAGNOSIS — E876 Hypokalemia: Secondary | ICD-10-CM

## 2016-11-26 DIAGNOSIS — I10 Essential (primary) hypertension: Secondary | ICD-10-CM

## 2016-11-26 LAB — BASIC METABOLIC PANEL
ANION GAP: 6 (ref 5–15)
BUN: 6 mg/dL (ref 6–20)
CO2: 23 mmol/L (ref 22–32)
Calcium: 9.3 mg/dL (ref 8.9–10.3)
Chloride: 108 mmol/L (ref 101–111)
Creatinine, Ser: 0.98 mg/dL (ref 0.44–1.00)
GLUCOSE: 96 mg/dL (ref 65–99)
POTASSIUM: 4 mmol/L (ref 3.5–5.1)
SODIUM: 137 mmol/L (ref 135–145)

## 2016-11-26 LAB — MAGNESIUM: MAGNESIUM: 2.1 mg/dL (ref 1.7–2.4)

## 2016-11-26 MED ORDER — MAGNESIUM OXIDE -MG SUPPLEMENT 200 MG PO TABS: 200.0000 mg | ORAL_TABLET | Freq: Every day | ORAL | 6 refills | Status: DC

## 2016-11-26 MED ORDER — ESCITALOPRAM OXALATE 20 MG PO TABS: 20.0000 mg | ORAL_TABLET | Freq: Every day | ORAL | 0 refills | Status: DC

## 2016-11-26 NOTE — Telephone Encounter (Signed)
-----   Message from Jodelle Gross, NP sent at 11/26/2016  3:27 PM EDT ----- Increase potassium to 40 mEq daily, have her take 60 mEq today only,  and please add magnesium 200 mg daily. Repeat BMET and Magnesium in one week.

## 2016-11-27 ENCOUNTER — Other Ambulatory Visit: Payer: Self-pay | Admitting: Surgery

## 2016-11-27 DIAGNOSIS — Z951 Presence of aortocoronary bypass graft: Secondary | ICD-10-CM

## 2016-11-28 ENCOUNTER — Encounter: Payer: Self-pay | Admitting: Surgery

## 2016-11-28 ENCOUNTER — Ambulatory Visit
Admission: RE | Admit: 2016-11-28 | Discharge: 2016-11-28 | Disposition: A | Discharge status: Home / Self Care | Payer: BLUE CROSS/BLUE SHIELD | Source: Ambulatory Visit | Attending: Surgery | Admitting: Surgery

## 2016-11-28 ENCOUNTER — Ambulatory Visit (INDEPENDENT_AMBULATORY_CARE_PROVIDER_SITE_OTHER): Payer: Self-pay | Admitting: Surgery

## 2016-11-28 VITALS — BP 96/65 | HR 83 | Resp 16 | Ht 64.0 in | Wt 159.8 lb

## 2016-11-28 DIAGNOSIS — J90 Pleural effusion, not elsewhere classified: Secondary | ICD-10-CM | POA: Diagnosis not present

## 2016-11-28 DIAGNOSIS — J9811 Atelectasis: Secondary | ICD-10-CM | POA: Diagnosis not present

## 2016-11-28 DIAGNOSIS — I2510 Atherosclerotic heart disease of native coronary artery without angina pectoris: Secondary | ICD-10-CM

## 2016-11-28 DIAGNOSIS — Z951 Presence of aortocoronary bypass graft: Secondary | ICD-10-CM

## 2016-11-28 MED ORDER — OXYCODONE HCL 5 MG PO TABS: ORAL_TABLET | ORAL | 0 refills | Status: DC

## 2016-11-29 ENCOUNTER — Encounter: Payer: Self-pay | Admitting: Surgery

## 2016-11-29 NOTE — Progress Notes (Signed)
HPI: Patient returns for routine postoperative follow-up having undergone CABG x 3  on 10/26/2016. [name] early postoperative recovery while in the hospital was notable for an uncomplicated postop course. Since hospital discharge the patient reports that she has been walking without chest pain or shortness of breath. Her stamina is gradually improving. She still has a lot of incisional chest wall pain but has not been taking any pain medication since she ran out of Oxy IR that she went home with. She continues on Brilinta for a previous RCA stent on 02/19/2016.   Current Outpatient Prescriptions  Medication Sig Dispense Refill  . acetaminophen (TYLENOL) 325 MG tablet Take 2 tablets (650 mg total) by mouth every 6 (six) hours as needed for headache.    Marland Kitchen aspirin EC 81 MG EC tablet Take 1 tablet (81 mg total) by mouth daily. 30 tablet 11  . carvedilol (COREG) 3.125 MG tablet Take 1 tablet (3.125 mg total) by mouth 2 (two) times daily with a meal. 60 tablet 6  . escitalopram (LEXAPRO) 20 MG tablet Take 1 tablet (20 mg total) by mouth daily. 30 tablet 0  . Magnesium Oxide 200 MG TABS Take 1 tablet (200 mg total) by mouth daily. 30 tablet 6  . nitroGLYCERIN (NITROSTAT) 0.4 MG SL tablet PLACE 1 T UNDER THE TONGUE Q 5 MINUTES FOR 3 DOSES AS NEEDED FOR CHEST PAIN  1  . potassium chloride SA (KLOR-CON M20) 20 MEQ tablet Take 2 tablets (40 mEq total) by mouth daily. 180 tablet 3  . rosuvastatin (CRESTOR) 40 MG tablet Take 1 tablet (40 mg total) by mouth daily at 6 PM. 30 tablet 1  . ticagrelor (BRILINTA) 90 MG TABS tablet Take 1 tablet (90 mg total) by mouth 2 (two) times daily. 60 tablet 6  . oxyCODONE (OXY IR/ROXICODONE) 5 MG immediate release tablet Take 5 mg by mouth every 4-6 hours PRN severe pain 30 tablet 0   No current facility-administered medications for this visit.     Physical Exam: BP 96/65 (BP Location: Right Arm, Patient Position: Sitting, Cuff Size: Large)   Pulse 83    Resp 16   Ht 5\' 4"  (1.626 m)   Wt 159 lb 12.8 oz (72.5 kg)   SpO2 98% Comment: ON RA  BMI 27.43 kg/m  She looks uncomfortable due to the incisional discomfort. Lung exam is clear. Cardiac exam shows a regular rate and rhythm with normal heart sounds. Chest incision is healing well and sternum is stable. The leg incisions are healing well and there is no peripheral edema.   Diagnostic Tests:  CLINICAL DATA:  HX CABG 10/26/16, patient complains of burning a the incision site along with sever upper abdominal pain, SOB, hx MI (patient going to [facility] now)  EXAM: CHEST  2 VIEW  COMPARISON:  11/11/2016  FINDINGS: Status post median sternotomy and CABG. The heart size is normal. There is subsegmental atelectasis at both lung bases, increased on the left since prior study and stable on the right. There are small bilateral pleural effusions. No pneumothorax. No consolidations.  IMPRESSION: Bibasilar atelectasis and small effusions.   Electronically Signed   By: Carolyn Gaines M.D.   On: 11/28/2016 15:44   Impression:  Overall I think she is doing well. I encouraged her to continue walking. She is planning to participate in cardiac rehab. She is still having a lot of chest wall pain from the sternotomy and I told her that I would expect this to  continue improving over the next two months. I refilled her Oxy IR prescription in case she gets too uncomfortable. Otherwise she can take Tylenol. I told her that she could drive her car but should not lift anything heavier than 10 lbs for three months postop.   Plan:  I will plan to see her back in one month to see how she is doing with the chest wall pain. She will have a repeat CXR at that time.   Carolyn Borne, MD [facility] [phone]

## 2016-11-30 DIAGNOSIS — M797 Fibromyalgia: Secondary | ICD-10-CM | POA: Diagnosis not present

## 2016-12-03 ENCOUNTER — Other Ambulatory Visit (HOSPITAL_COMMUNITY)
Admission: RE | Admit: 2016-12-03 | Discharge: 2016-12-03 | Disposition: A | Discharge status: Home / Self Care | Payer: BLUE CROSS/BLUE SHIELD | Source: Ambulatory Visit | Attending: Adult Health | Admitting: Adult Health

## 2016-12-03 DIAGNOSIS — I10 Essential (primary) hypertension: Secondary | ICD-10-CM | POA: Diagnosis not present

## 2016-12-03 DIAGNOSIS — E876 Hypokalemia: Secondary | ICD-10-CM | POA: Insufficient documentation

## 2016-12-03 LAB — BASIC METABOLIC PANEL
ANION GAP: 6 (ref 5–15)
BUN: 7 mg/dL (ref 6–20)
CO2: 23 mmol/L (ref 22–32)
Calcium: 9 mg/dL (ref 8.9–10.3)
Chloride: 109 mmol/L (ref 101–111)
Creatinine, Ser: 1.01 mg/dL — ABNORMAL HIGH (ref 0.44–1.00)
GFR calc Af Amer: >60 mL/min (ref >60)
GFR calc non Af Amer: >60 mL/min (ref >60)
GLUCOSE: 89 mg/dL (ref 65–99)
POTASSIUM: 3.3 mmol/L — AB (ref 3.5–5.1)
Sodium: 138 mmol/L (ref 135–145)

## 2016-12-03 LAB — MAGNESIUM: Magnesium: 2.1 mg/dL (ref 1.7–2.4)

## 2016-12-05 ENCOUNTER — Telehealth: Payer: Self-pay

## 2016-12-05 NOTE — Telephone Encounter (Signed)
Called pt., no answer. Left message for pt to return call.  

## 2016-12-05 NOTE — Telephone Encounter (Signed)
-----   Message from Kathryn M Lawrence, NP sent at 12/04/2016  3:56 PM EDT ----- Regarding: RE: low K+ Increase magnesium to 200 mg BID and increase potassium to 3 tablets daily. Repeat her BMET in one week.  ----- Message ----- From: Pinnix, Lukisha G, LPN Sent: 12/04/2016  12:56 PM To: Kathryn M Lawrence, NP Subject: low K+                                         Pt states she is taking 2 potassium and 1 mag. Daily   

## 2016-12-06 ENCOUNTER — Other Ambulatory Visit (HOSPITAL_COMMUNITY): Payer: Self-pay | Admitting: Pulmonary Disease

## 2016-12-06 DIAGNOSIS — I2510 Atherosclerotic heart disease of native coronary artery without angina pectoris: Secondary | ICD-10-CM | POA: Diagnosis not present

## 2016-12-06 DIAGNOSIS — R1084 Generalized abdominal pain: Secondary | ICD-10-CM

## 2016-12-06 DIAGNOSIS — I10 Essential (primary) hypertension: Secondary | ICD-10-CM | POA: Diagnosis not present

## 2016-12-06 DIAGNOSIS — R109 Unspecified abdominal pain: Secondary | ICD-10-CM | POA: Diagnosis not present

## 2016-12-06 DIAGNOSIS — M797 Fibromyalgia: Secondary | ICD-10-CM | POA: Diagnosis not present

## 2016-12-07 ENCOUNTER — Other Ambulatory Visit (HOSPITAL_COMMUNITY): Payer: Self-pay | Admitting: Pulmonary Disease

## 2016-12-07 DIAGNOSIS — M545 Low back pain: Secondary | ICD-10-CM

## 2016-12-12 ENCOUNTER — Telehealth: Payer: Self-pay | Admitting: *Deleted

## 2016-12-12 DIAGNOSIS — E876 Hypokalemia: Secondary | ICD-10-CM

## 2016-12-12 MED ORDER — POTASSIUM CHLORIDE CRYS ER 20 MEQ PO TBCR: 20.0000 meq | EXTENDED_RELEASE_TABLET | Freq: Three times a day (TID) | ORAL | 6 refills | Status: DC

## 2016-12-12 MED ORDER — MAGNESIUM 200 MG PO TABS: 200.0000 mg | ORAL_TABLET | Freq: Two times a day (BID) | ORAL | 6 refills | Status: DC

## 2016-12-12 NOTE — Telephone Encounter (Signed)
Patient notified

## 2016-12-12 NOTE — Telephone Encounter (Signed)
-----   Message from Jodelle Gross, NP sent at 12/04/2016  3:56 PM EDT ----- Regarding: RE: low K+ Increase magnesium to 200 mg BID and increase potassium to 3 tablets daily. Repeat her BMET in one week.  ----- Message ----- From: Kerney Elbe, LPN Sent: 10/02/4654  12:56 PM To: Jodelle Gross, NP Subject: low K+                                         Pt states she is taking 2 potassium and 1 mag. Daily

## 2016-12-14 ENCOUNTER — Ambulatory Visit (HOSPITAL_COMMUNITY)
Admission: RE | Admit: 2016-12-14 | Discharge: 2016-12-14 | Disposition: A | Discharge status: Home / Self Care | Payer: BLUE CROSS/BLUE SHIELD | Source: Ambulatory Visit | Attending: Pulmonary Disease | Admitting: Pulmonary Disease

## 2016-12-14 ENCOUNTER — Other Ambulatory Visit (HOSPITAL_COMMUNITY): Payer: Self-pay | Admitting: Pulmonary Disease

## 2016-12-14 DIAGNOSIS — M549 Dorsalgia, unspecified: Secondary | ICD-10-CM | POA: Insufficient documentation

## 2016-12-14 DIAGNOSIS — M4804 Spinal stenosis, thoracic region: Secondary | ICD-10-CM | POA: Diagnosis not present

## 2016-12-14 DIAGNOSIS — R109 Unspecified abdominal pain: Secondary | ICD-10-CM | POA: Insufficient documentation

## 2016-12-14 DIAGNOSIS — M545 Low back pain: Secondary | ICD-10-CM

## 2016-12-14 DIAGNOSIS — K8020 Calculus of gallbladder without cholecystitis without obstruction: Secondary | ICD-10-CM | POA: Diagnosis not present

## 2016-12-14 DIAGNOSIS — M47814 Spondylosis without myelopathy or radiculopathy, thoracic region: Secondary | ICD-10-CM | POA: Insufficient documentation

## 2016-12-14 DIAGNOSIS — R1084 Generalized abdominal pain: Secondary | ICD-10-CM

## 2016-12-14 DIAGNOSIS — K8000 Calculus of gallbladder with acute cholecystitis without obstruction: Secondary | ICD-10-CM | POA: Diagnosis not present

## 2016-12-14 DIAGNOSIS — Q760 Spina bifida occulta: Secondary | ICD-10-CM | POA: Diagnosis not present

## 2016-12-17 ENCOUNTER — Encounter: Payer: Self-pay | Admitting: Cardiology

## 2016-12-17 ENCOUNTER — Ambulatory Visit (INDEPENDENT_AMBULATORY_CARE_PROVIDER_SITE_OTHER): Payer: BLUE CROSS/BLUE SHIELD | Admitting: Cardiology

## 2016-12-17 VITALS — BP 108/70 | HR 81 | Ht 63.0 in | Wt 160.0 lb

## 2016-12-17 DIAGNOSIS — I2510 Atherosclerotic heart disease of native coronary artery without angina pectoris: Secondary | ICD-10-CM | POA: Diagnosis not present

## 2016-12-17 DIAGNOSIS — E876 Hypokalemia: Secondary | ICD-10-CM

## 2016-12-17 NOTE — Patient Instructions (Signed)
Medication Instructions:  Your physician recommends that you continue on your current medications as directed. Please refer to the Current Medication list given to you today.   Labwork: @ END OF THIS WEEK   Testing/Procedures: NONE  Follow-Up: Your physician recommends that you schedule a follow-up appointment in: 3 MONTHS    Any Other Special Instructions Will Be Listed Below (If Applicable).     If you need a refill on your cardiac medications before your next appointment, please call your pharmacy.

## 2016-12-17 NOTE — Progress Notes (Signed)
Clinical Summary Ms. Guimaraes is a 51 y.o.female seen today for follow up of the following medical problems.   1. 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 Residual LAD disease 60-70% managed medically. - From Dr Verl Dicker notes nuclear stress 03/2016 (after stent) with mild septal ischemia, intermediate risk. - 09/2016 cath with 90% ostial LM. S/p cabg 10/16/16 wth LIMA-LAD, SVG-OM, SVG-RCA - 09/2016 echo LVEF 55-60%, no WMAs  - occasional SOB at times. No cardiac chest pain, has had some chest wall pain.  - chest wall pain followed by CT surgery.  - compliant with meds, Has not been on ACE-I due to soft bp's  2. Hypokalemia/Hypomagnesemia - K was 3.3. Her KCl was increased to tid. Mg was 2.1      SH: husband is admitted to ICU currently. Considering hospice. Very stressful time for her   Past Medical History:  Diagnosis Date  . Anxiety   . Arthritis   . Depression   . Fibromyalgia   . MI (myocardial infarction) (HCC) 01/2016     Allergies  Allergen Reactions  . Bee Venom Other (See Comments)    Pus sites at injection  . Adhesive [Tape] Itching and Swelling    Please use paper tape  . Triple Antibiotic [Bacitracin-Neomycin-Polymyxin] Rash     Current Outpatient Prescriptions  Medication Sig Dispense Refill  . acetaminophen (TYLENOL) 325 MG tablet Take 2 tablets (650 mg total) by mouth every 6 (six) hours as needed for headache.    Marland Kitchen aspirin EC 81 MG EC tablet Take 1 tablet (81 mg total) by mouth daily. 30 tablet 11  . carvedilol (COREG) 3.125 MG tablet Take 1 tablet (3.125 mg total) by mouth 2 (two) times daily with a meal. 60 tablet 6  . escitalopram (LEXAPRO) 20 MG tablet Take 1 tablet (20 mg total) by mouth daily. 30 tablet 0  . Magnesium 200 MG TABS Take 1 tablet (200 mg total) by mouth 2 (two) times daily. 60 each 6  . Magnesium Oxide 200 MG TABS Take 1 tablet (200 mg total) by mouth daily. 30 tablet 6  . nitroGLYCERIN (NITROSTAT) 0.4 MG  SL tablet PLACE 1 T UNDER THE TONGUE Q 5 MINUTES FOR 3 DOSES AS NEEDED FOR CHEST PAIN  1  . oxyCODONE (OXY IR/ROXICODONE) 5 MG immediate release tablet Take 5 mg by mouth every 4-6 hours PRN severe pain 30 tablet 0  . potassium chloride SA (K-DUR,KLOR-CON) 20 MEQ tablet Take 1 tablet (20 mEq total) by mouth 3 (three) times daily. 90 tablet 6  . rosuvastatin (CRESTOR) 40 MG tablet Take 1 tablet (40 mg total) by mouth daily at 6 PM. 30 tablet 1  . ticagrelor (BRILINTA) 90 MG TABS tablet Take 1 tablet (90 mg total) by mouth 2 (two) times daily. 60 tablet 6   No current facility-administered medications for this visit.      Past Surgical History:  Procedure Laterality Date  . ABDOMINAL HYSTERECTOMY    . CARDIAC CATHETERIZATION N/A 02/19/2016   Procedure: Left Heart Cath and Coronary Angiography;  Surgeon: Yates Decamp, MD;  Location: [facility] INVASIVE CV LAB;  Service: Cardiovascular;  Laterality: N/A;  . CARDIAC CATHETERIZATION N/A 02/19/2016   Procedure: Coronary Stent Intervention;  Surgeon: Yates Decamp, MD;  Location: [facility] INVASIVE CV LAB;  Service: Cardiovascular;  Laterality: N/A;  . cesarian    . CORONARY ARTERY BYPASS GRAFT N/A 10/26/2016   Procedure: CORONARY ARTERY BYPASS GRAFTING (CABG), ON PUMP, TIMES  THREE, USING LEFT INTERNAL MAMMARY ARTERY AND ENDOSCOPICALLY HARVESTED RIGHT GREATER SAPHENOUS VEIN;  Surgeon: Alleen Borne, MD;  Location: [facility] OR;  Service: Open Heart Surgery;  Laterality: N/A;  LIMA-LAD SVG-RCA SVG-OM  . LEFT HEART CATH AND CORONARY ANGIOGRAPHY N/A 10/22/2016   Procedure: Left Heart Cath and Coronary Angiography;  Surgeon: Lennette Bihari, MD;  Location: [facility] INVASIVE CV LAB;  Service: Cardiovascular;  Laterality: N/A;  . TEE WITHOUT CARDIOVERSION N/A 10/26/2016   Procedure: TRANSESOPHAGEAL ECHOCARDIOGRAM (TEE);  Surgeon: Alleen Borne, MD;  Location: [facility] OR;  Service: Open Heart Surgery;  Laterality: N/A;     Allergies  Allergen Reactions  . Bee Venom Other (See Comments)    Pus  sites at injection  . Adhesive [Tape] Itching and Swelling    Please use paper tape  . Triple Antibiotic [Bacitracin-Neomycin-Polymyxin] Rash      Family History  Problem Relation Age of Onset  . COPD Mother   . Arthritis Mother   . Heart disease Mother   . Psoriasis Father      Social History Ms. Furrow reports that she quit smoking about 5 years ago. She has never used smokeless tobacco. Ms. Dockum reports that she does not drink alcohol.   Review of Systems CONSTITUTIONAL: No weight loss, fever, chills, weakness or fatigue.  HEENT: Eyes: No visual loss, blurred vision, double vision or yellow sclerae.No hearing loss, sneezing, congestion, runny nose or sore throat.  SKIN: No rash or itching.  CARDIOVASCULAR: per hpi RESPIRATORY: No shortness of breath, cough or sputum.  GASTROINTESTINAL: No anorexia, nausea, vomiting or diarrhea. No abdominal pain or blood.  GENITOURINARY: No burning on urination, no polyuria NEUROLOGICAL: No headache, dizziness, syncope, paralysis, ataxia, numbness or tingling in the extremities. No change in bowel or bladder control.  MUSCULOSKELETAL: No muscle, back pain, joint pain or stiffness.  LYMPHATICS: No enlarged nodes. No history of splenectomy.  PSYCHIATRIC: No history of depression or anxiety.  ENDOCRINOLOGIC: No reports of sweating, cold or heat intolerance. No polyuria or polydipsia.  Marland Kitchen   Physical Examination There were no vitals filed for this visit. Filed Weights   12/17/16 0820  Weight: 160 lb (72.6 kg)   Vitals:   12/17/16 0820  Weight: 160 lb (72.6 kg)  Height: 5\' 3"  (1.6 m)    Gen: resting comfortably, no acute distress HEENT: no scleral icterus, pupils equal round and reactive, no palptable cervical adenopathy,  CV: RRR, no m/r/g, no jvd Resp: Clear to auscultation bilaterally GI: abdomen is soft, non-tender, non-distended, normal bowel sounds, no hepatosplenomegaly MSK: extremities are warm, no edema.  Skin: warm, no  rash Neuro:  no focal deficits Psych: appropriate affect    Assessment and Plan  1. CAD - no recent symptoms. With her prior stent has been continued on DAPT after her CABG. Likely can d/c at follow up.  - medical therapy limited by soft bp's - continue current meds  2. Hypokalemia - repeat BMET/Mg later this week   F/u 3 months      [name], M.D.

## 2016-12-21 ENCOUNTER — Telehealth: Payer: Self-pay | Admitting: Cardiology

## 2016-12-21 NOTE — Telephone Encounter (Signed)
Pt is unable to have her labs done today due to a death in her family.

## 2016-12-22 ENCOUNTER — Emergency Department (HOSPITAL_COMMUNITY): Payer: BLUE CROSS/BLUE SHIELD

## 2016-12-22 ENCOUNTER — Emergency Department (HOSPITAL_COMMUNITY)
Admission: EM | Admit: 2016-12-22 | Discharge: 2016-12-22 | Disposition: A | Discharge status: Home / Self Care | Payer: BLUE CROSS/BLUE SHIELD | Attending: Emergency Medicine | Admitting: Emergency Medicine

## 2016-12-22 ENCOUNTER — Encounter (HOSPITAL_COMMUNITY): Payer: Self-pay | Admitting: Emergency Medicine

## 2016-12-22 DIAGNOSIS — R109 Unspecified abdominal pain: Secondary | ICD-10-CM

## 2016-12-22 DIAGNOSIS — Z79899 Other long term (current) drug therapy: Secondary | ICD-10-CM | POA: Diagnosis not present

## 2016-12-22 DIAGNOSIS — Z87891 Personal history of nicotine dependence: Secondary | ICD-10-CM | POA: Insufficient documentation

## 2016-12-22 DIAGNOSIS — I10 Essential (primary) hypertension: Secondary | ICD-10-CM | POA: Insufficient documentation

## 2016-12-22 DIAGNOSIS — N390 Urinary tract infection, site not specified: Secondary | ICD-10-CM | POA: Diagnosis not present

## 2016-12-22 DIAGNOSIS — I25810 Atherosclerosis of coronary artery bypass graft(s) without angina pectoris: Secondary | ICD-10-CM | POA: Diagnosis not present

## 2016-12-22 DIAGNOSIS — Z7982 Long term (current) use of aspirin: Secondary | ICD-10-CM | POA: Insufficient documentation

## 2016-12-22 DIAGNOSIS — K8020 Calculus of gallbladder without cholecystitis without obstruction: Secondary | ICD-10-CM | POA: Diagnosis not present

## 2016-12-22 DIAGNOSIS — R0789 Other chest pain: Secondary | ICD-10-CM | POA: Diagnosis not present

## 2016-12-22 DIAGNOSIS — R1084 Generalized abdominal pain: Secondary | ICD-10-CM | POA: Diagnosis present

## 2016-12-22 DIAGNOSIS — R079 Chest pain, unspecified: Secondary | ICD-10-CM | POA: Insufficient documentation

## 2016-12-22 DIAGNOSIS — R1013 Epigastric pain: Secondary | ICD-10-CM | POA: Diagnosis not present

## 2016-12-22 LAB — COMPREHENSIVE METABOLIC PANEL
ALK PHOS: 88 U/L (ref 38–126)
ALT: 17 U/L (ref 14–54)
AST: 26 U/L (ref 15–41)
Albumin: 3.7 g/dL (ref 3.5–5.0)
Anion gap: 7 (ref 5–15)
BUN: 9 mg/dL (ref 6–20)
CALCIUM: 9 mg/dL (ref 8.9–10.3)
CHLORIDE: 106 mmol/L (ref 101–111)
CO2: 25 mmol/L (ref 22–32)
CREATININE: 0.79 mg/dL (ref 0.44–1.00)
Glucose, Bld: 90 mg/dL (ref 65–99)
Potassium: 3.8 mmol/L (ref 3.5–5.1)
Sodium: 138 mmol/L (ref 135–145)
Total Bilirubin: 0.5 mg/dL (ref 0.3–1.2)
Total Protein: 7.5 g/dL (ref 6.5–8.1)

## 2016-12-22 LAB — URINALYSIS, ROUTINE W REFLEX MICROSCOPIC
Bilirubin Urine: NEGATIVE
GLUCOSE, UA: NEGATIVE mg/dL
Hgb urine dipstick: NEGATIVE
KETONES UR: NEGATIVE mg/dL
Nitrite: NEGATIVE
PROTEIN: 30 mg/dL — AB
Specific Gravity, Urine: 1.029 (ref 1.005–1.030)
pH: 6 (ref 5.0–8.0)

## 2016-12-22 LAB — CBC WITH DIFFERENTIAL/PLATELET
BASOS ABS: 0 10*3/uL (ref 0.0–0.1)
Basophils Relative: 0 %
Eosinophils Absolute: 0.4 10*3/uL (ref 0.0–0.7)
Eosinophils Relative: 6 %
HEMATOCRIT: 35.1 % — AB (ref 36.0–46.0)
Hemoglobin: 11 g/dL — ABNORMAL LOW (ref 12.0–15.0)
LYMPHS ABS: 1.2 10*3/uL (ref 0.7–4.0)
LYMPHS PCT: 18 %
MCH: 27 pg (ref 26.0–34.0)
MCHC: 31.3 g/dL (ref 30.0–36.0)
MCV: 86.2 fL (ref 78.0–100.0)
Monocytes Absolute: 0.4 10*3/uL (ref 0.1–1.0)
Monocytes Relative: 5 %
NEUTROS ABS: 4.6 10*3/uL (ref 1.7–7.7)
Neutrophils Relative %: 71 %
Platelets: 300 10*3/uL (ref 150–400)
RBC: 4.07 MIL/uL (ref 3.87–5.11)
RDW: 16 % — ABNORMAL HIGH (ref 11.5–15.5)
WBC: 6.5 10*3/uL (ref 4.0–10.5)

## 2016-12-22 LAB — TROPONIN I: Troponin I: <0.03 ng/mL (ref <0.03)

## 2016-12-22 LAB — LIPASE, BLOOD: LIPASE: 26 U/L (ref 11–51)

## 2016-12-22 MED ORDER — CEPHALEXIN 500 MG PO CAPS: 500.0000 mg | ORAL_CAPSULE | Freq: Four times a day (QID) | ORAL | 0 refills | Status: DC

## 2016-12-22 MED ORDER — MORPHINE SULFATE (PF) 4 MG/ML IV SOLN
4.0000 mg | Freq: Once | INTRAVENOUS | Status: AC
  Administered 2016-12-22: 4 mg via INTRAVENOUS
  Filled 2016-12-22: qty 1

## 2016-12-22 MED ORDER — OXYCODONE-ACETAMINOPHEN 5-325 MG PO TABS: 1.0000 | ORAL_TABLET | ORAL | 0 refills | Status: DC | PRN

## 2016-12-22 MED ORDER — SODIUM CHLORIDE 0.9 % IV BOLUS (SEPSIS)
1000.0000 mL | Freq: Once | INTRAVENOUS | Status: AC
  Administered 2016-12-22: 1000 mL via INTRAVENOUS

## 2016-12-22 MED ORDER — ONDANSETRON HCL 4 MG/2ML IJ SOLN
4.0000 mg | Freq: Once | INTRAMUSCULAR | Status: AC
  Administered 2016-12-22: 4 mg via INTRAVENOUS
  Filled 2016-12-22: qty 2

## 2016-12-22 NOTE — ED Triage Notes (Signed)
Epigastric pain on and off (pt has been dx with gallstones)  Also c/o of chest burning on and off in center of chest x 2 days.

## 2016-12-22 NOTE — ED Notes (Signed)
Pt reports a recent CABG, abd pain with ultrasound revealing gallstones and her spouse's death on 12-19-2022. She appears anxious, has pressured speech and is accompanied by her sister. She reports pain as grabbing and coming and going with its' pain as sharp

## 2016-12-22 NOTE — ED Provider Notes (Signed)
[facility]-EMERGENCY DEPT Provider Note   CSN: [id number] Arrival date & time: 12/22/16  1912     History   Chief Complaint Chief Complaint  Patient presents with  . Abdominal Pain    HPI Carolyn Gaines is a 51 y.o. female.  Patient reports diffuse abdominal pain, chest pain for several days. Her spouse died 2 days ago. She is status post CABG on 10/26/16. No crushing substernal chest pain, dyspnea, diaphoresis, dysuria. She has a known history of gallstones discovered from an ultrasound. Past mental history includes fibromyalgia, depression, anxiety, coronary artery disease. Severity of symptoms is moderate. Nothing makes symptoms better or worse.      Past Medical History:  Diagnosis Date  . Anxiety   . Arthritis   . Depression   . Fibromyalgia   . MI (myocardial infarction) (HCC) 01/2016    Patient Active Problem List   Diagnosis Date Noted  . 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 Unstable angina (HCC)   . Fibromyalgia 10/20/2016  . Essential hypertension 07/18/2016  . Psoriasis 07/18/2016  . Precordial chest pain 07/03/2016  . Depression with anxiety 07/03/2016  . Normocytic anemia 07/03/2016  . History of acute inferior wall MI 02/19/2016  . Coronary artery disease involving native heart without angina pectoris 02/19/2016    Past Surgical History:  Procedure Laterality Date  . ABDOMINAL HYSTERECTOMY    . CARDIAC CATHETERIZATION N/A 02/19/2016   Procedure: Left Heart Cath and Coronary Angiography;  Surgeon: Yates Decamp, MD;  Location: [facility] INVASIVE CV LAB;  Service: Cardiovascular;  Laterality: N/A;  . CARDIAC CATHETERIZATION N/A 02/19/2016   Procedure: Coronary Stent Intervention;  Surgeon: Yates Decamp, MD;  Location: [facility] INVASIVE CV LAB;  Service: Cardiovascular;  Laterality: N/A;  . cesarian    . CORONARY ARTERY BYPASS GRAFT N/A 10/26/2016   Procedure: CORONARY ARTERY BYPASS GRAFTING (CABG), ON PUMP, TIMES THREE, USING LEFT INTERNAL MAMMARY ARTERY AND ENDOSCOPICALLY HARVESTED RIGHT  GREATER SAPHENOUS VEIN;  Surgeon: Alleen Borne, MD;  Location: [facility] OR;  Service: Open Heart Surgery;  Laterality: N/A;  LIMA-LAD SVG-RCA SVG-OM  . LEFT HEART CATH AND CORONARY ANGIOGRAPHY N/A 10/22/2016   Procedure: Left Heart Cath and Coronary Angiography;  Surgeon: Lennette Bihari, MD;  Location: [facility] INVASIVE CV LAB;  Service: Cardiovascular;  Laterality: N/A;  . TEE WITHOUT CARDIOVERSION N/A 10/26/2016   Procedure: TRANSESOPHAGEAL ECHOCARDIOGRAM (TEE);  Surgeon: Alleen Borne, MD;  Location: [facility] OR;  Service: Open Heart Surgery;  Laterality: N/A;    OB History    No data available       Home Medications    Prior to Admission medications   Medication Sig Start Date End Date Taking? Authorizing Provider  acetaminophen (TYLENOL) 325 MG tablet Take 2 tablets (650 mg total) by mouth every 6 (six) hours as needed for headache. 10/31/16  Yes Ardelle Balls, PA-C  aspirin EC 81 MG EC tablet Take 1 tablet (81 mg total) by mouth daily. 02/20/16  Yes Marcy Salvo, NP  carvedilol (COREG) 3.125 MG tablet Take 1 tablet (3.125 mg total) by mouth 2 (two) times daily with a meal. 09/19/16  Yes Dyann Kief, PA-C  escitalopram (LEXAPRO) 20 MG tablet Take 1 tablet (20 mg total) by mouth daily. 11/26/16  Yes Jodelle Gross, NP  Magnesium 200 MG TABS Take 1 tablet (200 mg total) by mouth 2 (two) times daily. 12/12/16  Yes Jodelle Gross, NP  nitroGLYCERIN (NITROSTAT) 0.4 MG SL tablet PLACE 1 T UNDER THE TONGUE  Q 5 MINUTES FOR 3 DOSES AS NEEDED FOR CHEST PAIN 08/29/16  Yes [provider]  potassium chloride SA (K-DUR,KLOR-CON) 20 MEQ tablet Take 1 tablet (20 mEq total) by mouth 3 (three) times daily. 12/12/16  Yes Jodelle Gross, NP  rosuvastatin (CRESTOR) 40 MG tablet Take 1 tablet (40 mg total) by mouth daily at 6 PM. 10/31/16  Yes Ardelle Balls, PA-C  ticagrelor (BRILINTA) 90 MG TABS tablet Take 1 tablet (90 mg total) by mouth 2 (two) times daily. 09/19/16  Yes Dyann Kief, PA-C  cephALEXin (KEFLEX) 500 MG capsule Take 1 capsule (500 mg total) by mouth 4 (four) times daily. 12/22/16   Donnetta Hutching, MD  Magnesium Oxide 200 MG TABS Take 1 tablet (200 mg total) by mouth daily. Patient not taking: Reported on 12/22/2016 11/26/16   Jodelle Gross, NP  oxyCODONE (OXY IR/ROXICODONE) 5 MG immediate release tablet Take 5 mg by mouth every 4-6 hours PRN severe pain Patient not taking: Reported on 12/22/2016 11/28/16   Alleen Borne, MD  oxyCODONE-acetaminophen (PERCOCET) 5-325 MG tablet Take 1-2 tablets by mouth every 4 (four) hours as needed. 12/22/16   Donnetta Hutching, MD    Family History Family History  Problem Relation Age of Onset  . COPD Mother   . Arthritis Mother   . Heart disease Mother   . Psoriasis Father     Social History Social History  Substance Use Topics  . Smoking status: Former Smoker    Quit date: 03/21/2011  . Smokeless tobacco: Never Used  . Alcohol use No     Allergies   Bee venom; Adhesive [tape]; and Triple antibiotic [bacitracin-neomycin-polymyxin]   Review of Systems Review of Systems  All other systems reviewed and are negative.    Physical Exam Updated Vital Signs BP 104/63   Pulse 91   Temp 98.3 F (36.8 C) (Oral)   Resp 19   Wt 72.6 kg (160 lb)   SpO2 95%   BMI 28.34 kg/m   Physical Exam  Constitutional: She is oriented to person, place, and time. She appears well-developed and well-nourished.  HENT:  Head: Normocephalic and atraumatic.  Eyes: Conjunctivae are normal.  Neck: Neck supple.  Cardiovascular: Normal rate and regular rhythm.   Pulmonary/Chest: Effort normal and breath sounds normal.  Abdominal: Soft. Bowel sounds are normal.  Musculoskeletal: Normal range of motion.  Neurological: She is alert and oriented to person, place, and time.  Skin: Skin is warm and dry.  Psychiatric: She has a normal mood and affect. Her behavior is normal.  Nursing note and vitals reviewed.    ED  Treatments / Results  Labs (all labs ordered are listed, but only abnormal results are displayed) Labs Reviewed  CBC WITH DIFFERENTIAL/PLATELET - Abnormal; Notable for the following:       Result Value   Hemoglobin 11.0 (*)    HCT 35.1 (*)    RDW 16.0 (*)    All other components within normal limits  URINALYSIS, ROUTINE W REFLEX MICROSCOPIC - Abnormal; Notable for the following:    Color, Urine AMBER (*)    APPearance HAZY (*)    Protein, ur 30 (*)    Leukocytes, UA TRACE (*)    Bacteria, UA RARE (*)    Squamous Epithelial / LPF 6-30 (*)    All other components within normal limits  URINE CULTURE  COMPREHENSIVE METABOLIC PANEL  LIPASE, BLOOD  TROPONIN I    EKG  EKG Interpretation  Date/Time:  Saturday Dec 22 2016 20:05:15 EDT Ventricular Rate:  96 PR Interval:    QRS Duration: 80 QT Interval:  325 QTC Calculation: 411 R Axis:   75 Text Interpretation:  Sinus rhythm Probable left atrial enlargement Low voltage, precordial leads Confirmed by Adriana Simas  MD, [place] ([id number]) on 12/22/2016 8:28:13 PM       Radiology Dg Chest 2 View  Result Date: 12/22/2016 CLINICAL DATA:  Chest pain EXAM: CHEST  2 VIEW COMPARISON:  11/28/2016 FINDINGS: Cardiac shadow is within normal limits. Postsurgical changes are again seen. Bibasilar changes are noted likely related to scarring. No acute infiltrate is seen. No bony abnormality is noted. IMPRESSION: Chronic appearing changes in the bases bilaterally. Electronically Signed   By: Alcide Clever M.D.   On: 12/22/2016 20:48   Ct Renal Stone Study  Result Date: 12/22/2016 CLINICAL DATA:  Epigastric pain on and off (pt has been dx with gallstones) Also c/o of chest burning on and off in center of chest x 2 days. Recent CABG EXAM: CT ABDOMEN AND PELVIS WITHOUT CONTRAST TECHNIQUE: Multidetector CT imaging of the abdomen and pelvis was performed following the standard protocol without IV contrast. COMPARISON:  CT abdomen dated 12/04/2007. FINDINGS: Lower  chest: Small patchy consolidations at each lung base, most likely atelectasis, and left basilar pleural thickening. Hepatobiliary: Small layering stones within the otherwise normal-appearing gallbladder. No evidence of acute cholecystitis. No focal liver abnormality is seen. No bile duct dilatation. Pancreas: Unremarkable. No pancreatic ductal dilatation or surrounding inflammatory changes. Spleen: Normal in size without focal abnormality. Adrenals/Urinary Tract: Adrenal glands appear normal. Kidneys are unremarkable without mass, stone or hydronephrosis. No ureteral or bladder calculi identified. Bladder appears normal. Stomach/Bowel: Bowel is normal in caliber. No bowel wall thickening or evidence of bowel wall inflammation. Appendix is normal. Stomach is unremarkable, mildly distended with recently ingested material. Vascular/Lymphatic: No significant vascular findings are present. No enlarged abdominal or pelvic lymph nodes. Reproductive: Status post hysterectomy. No adnexal masses. Other: No free fluid or abscess collection. No free intraperitoneal air. Musculoskeletal: No acute or suspicious osseous finding. Superficial soft tissues are unremarkable. IMPRESSION: 1. No acute findings within the abdomen or pelvis. No bowel obstruction or evidence of bowel wall inflammation. No evidence of acute solid organ abnormality. 2. Cholelithiasis without evidence of acute cholecystitis. No biliary dilatation. 3. Small patchy consolidations at each lung base, most likely atelectasis, with adjacent left basilar pleural thickening which is suspected to be chronic. Consider follow-up chest CT in 3 months to ensure stability or resolution. Electronically Signed   By: Bary Richard M.D.   On: 12/22/2016 22:47    Procedures Procedures (including critical care time)  Medications Ordered in ED Medications  sodium chloride 0.9 % bolus 1,000 mL (1,000 mLs Intravenous New Bag/Given 12/22/16 2006)  ondansetron (ZOFRAN)  injection 4 mg (4 mg Intravenous Given 12/22/16 2006)  morphine 4 MG/ML injection 4 mg (4 mg Intravenous Given 12/22/16 2006)  morphine 4 MG/ML injection 4 mg (4 mg Intravenous Given 12/22/16 2115)     Initial Impression / Assessment and Plan / ED Course  I have reviewed the triage vital signs and the nursing notes.  Pertinent labs & imaging results that were available during my care of the patient were reviewed by me and considered in my medical decision making (see chart for details).     Patient appears anxious on exam. Otherwise, her physical exam was normal. Screening tests all were within reasonable limits with the exception of a mild urinary infection.  No obvious evidence of angina or an acute abdomen. CT renal shows no stones. Discharge medications Percocet and Keflex 500 mg. She has primary care follow-up.  Final Clinical Impressions(s) / ED Diagnoses   Final diagnoses:  Left flank pain  Abdominal pain, unspecified abdominal location  Chest pain, unspecified type  Urinary tract infection without hematuria, site unspecified    New Prescriptions New Prescriptions   CEPHALEXIN (KEFLEX) 500 MG CAPSULE    Take 1 capsule (500 mg total) by mouth 4 (four) times daily.   OXYCODONE-ACETAMINOPHEN (PERCOCET) 5-325 MG TABLET    Take 1-2 tablets by mouth every 4 (four) hours as needed.     Donnetta Hutching, MD 12/22/16 2326

## 2016-12-22 NOTE — Discharge Instructions (Signed)
Tests show no life-threatening condition. You may have a minor urinary infection. Prescription for antibiotic and pain medication. Follow-up your primary care doctor.

## 2016-12-24 LAB — URINE CULTURE: CULTURE: NO GROWTH

## 2016-12-25 NOTE — Telephone Encounter (Signed)
Noted, pt will get labs when appropriate

## 2017-01-01 ENCOUNTER — Other Ambulatory Visit: Payer: Self-pay | Admitting: Surgery

## 2017-01-01 DIAGNOSIS — Z951 Presence of aortocoronary bypass graft: Secondary | ICD-10-CM

## 2017-01-02 ENCOUNTER — Encounter: Payer: Self-pay | Admitting: Surgery

## 2017-01-02 ENCOUNTER — Ambulatory Visit
Admission: RE | Admit: 2017-01-02 | Discharge: 2017-01-02 | Disposition: A | Discharge status: Home / Self Care | Payer: BLUE CROSS/BLUE SHIELD | Source: Ambulatory Visit | Attending: Surgery | Admitting: Surgery

## 2017-01-02 ENCOUNTER — Ambulatory Visit (INDEPENDENT_AMBULATORY_CARE_PROVIDER_SITE_OTHER): Payer: Self-pay | Admitting: Surgery

## 2017-01-02 VITALS — BP 102/71 | HR 83 | Resp 16 | Ht 63.0 in | Wt 160.0 lb

## 2017-01-02 DIAGNOSIS — Z951 Presence of aortocoronary bypass graft: Secondary | ICD-10-CM

## 2017-01-02 DIAGNOSIS — R079 Chest pain, unspecified: Secondary | ICD-10-CM | POA: Diagnosis not present

## 2017-01-02 DIAGNOSIS — I2510 Atherosclerotic heart disease of native coronary artery without angina pectoris: Secondary | ICD-10-CM

## 2017-01-02 DIAGNOSIS — R05 Cough: Secondary | ICD-10-CM | POA: Diagnosis not present

## 2017-01-02 DIAGNOSIS — R0602 Shortness of breath: Secondary | ICD-10-CM | POA: Diagnosis not present

## 2017-01-02 NOTE — Progress Notes (Signed)
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 [name] early postoperative recovery while in the hospital was notable for an uncomplicated postop course. When I saw her on her last postop visit on 11/28/2016 she was still having a lot of incisional chest wall pain. She was still taking Oxy IR. She says that she continues to have tingling and burning pain over the skin of the left chest wall in the distribution of the left IMA. She has been under a lot of emotional stress since her husband died a few weeks ago of end stage COPD at [facility]. She is walking without shortness of breath and her stamina is improving. She also reports a new diagnosis of gallstones and is being evaluated for that tomorrow.   Current Outpatient Prescriptions  Medication Sig Dispense Refill  . acetaminophen (TYLENOL) 325 MG tablet Take 2 tablets (650 mg total) by mouth every 6 (six) hours as needed for headache.    Marland Kitchen aspirin EC 81 MG EC tablet Take 1 tablet (81 mg total) by mouth daily. 30 tablet 11  . carvedilol (COREG) 3.125 MG tablet Take 1 tablet (3.125 mg total) by mouth 2 (two) times daily with a meal. 60 tablet 6  . escitalopram (LEXAPRO) 20 MG tablet Take 1 tablet (20 mg total) by mouth daily. 30 tablet 0  . Magnesium 200 MG TABS Take 1 tablet (200 mg total) by mouth 2 (two) times daily. 60 each 6  . nitroGLYCERIN (NITROSTAT) 0.4 MG SL tablet PLACE 1 T UNDER THE TONGUE Q 5 MINUTES FOR 3 DOSES AS NEEDED FOR CHEST PAIN  1  . potassium chloride SA (K-DUR,KLOR-CON) 20 MEQ tablet Take 1 tablet (20 mEq total) by mouth 3 (three) times daily. 90 tablet 6  . rosuvastatin (CRESTOR) 40 MG tablet Take 1 tablet (40 mg total) by mouth daily at 6 PM. 30 tablet 1  . ticagrelor (BRILINTA) 90 MG TABS tablet Take 1 tablet (90 mg total) by mouth 2 (two) times daily. 60 tablet 6   No current facility-administered medications for this visit.      Physical Exam: BP  102/71 (BP Location: Right Arm, Patient Position: Sitting, Cuff Size: Large)   Pulse 83   Resp 16   Ht 5\' 3"  (1.6 m)   Wt 160 lb (72.6 kg)   SpO2 99% Comment: RA  BMI 28.34 kg/m  She looks well but emotional labile Lung exam is clear. Cardiac exam shows a regular rate and rhythm with normal heart sounds. Chest incision is healing well and sternum is stable. The leg incisions are healing well and there is no peripheral edema.   Diagnostic Tests:  CLINICAL DATA:  Status post CABG 2 months ago. The patient reports burning anterior chest pain and shortness of breath. Mildly productive cough in the morning.  EXAM: CHEST  2 VIEW  COMPARISON:  Chest x-ray of Dec 22, 2016  FINDINGS: The lungs are borderline hypoinflated. Linear increased density persists at both bases. There is a small amount of pleural fluid bilaterally which is also stable. The heart and pulmonary vascularity are normal. The mediastinum is normal in width. The sternal wires are intact. The retrosternal soft tissues are normal.  IMPRESSION: Persistent bibasilar atelectasis or scarring. Trace bilateral pleural effusions. No alveolar pneumonia nor pulmonary edema.   Electronically Signed   By: David  [place] M.D.   On: 01/02/2017 09:51   Impression:  I think she is doing  well from a medical standpoint and healing up after CABG surgery. She still has some neuralgia in the chest wall skin due to harvesting the IMA but that should resolved with time. There is no medication that is going to make that better. She is under a lot of emotional stress right now and that is certainly going to affect how she feels. I encouraged her and told her that she could expect the neuralgia to improve over the next few months.   Plan:  She is going to continue to follow up with Dr. Juanetta Gosling and Dr. Wyline Mood. She will let me know if the pain does not progressively improve. I told her that she will not be able to lift more than 10  lbs for 3 months postop and would not be able to return to her job until then.   Alleen Borne, MD [facility] [phone]

## 2017-01-03 DIAGNOSIS — M797 Fibromyalgia: Secondary | ICD-10-CM | POA: Diagnosis not present

## 2017-01-03 DIAGNOSIS — K8020 Calculus of gallbladder without cholecystitis without obstruction: Secondary | ICD-10-CM | POA: Diagnosis not present

## 2017-01-03 DIAGNOSIS — I10 Essential (primary) hypertension: Secondary | ICD-10-CM | POA: Diagnosis not present

## 2017-01-03 DIAGNOSIS — I2510 Atherosclerotic heart disease of native coronary artery without angina pectoris: Secondary | ICD-10-CM | POA: Diagnosis not present

## 2017-01-15 ENCOUNTER — Encounter: Payer: Self-pay | Admitting: General Surgery

## 2017-01-15 ENCOUNTER — Ambulatory Visit (INDEPENDENT_AMBULATORY_CARE_PROVIDER_SITE_OTHER): Payer: BLUE CROSS/BLUE SHIELD | Admitting: General Surgery

## 2017-01-15 VITALS — BP 116/80 | HR 84 | Temp 98.4°F | Resp 18 | Ht 64.0 in | Wt 159.0 lb

## 2017-01-15 DIAGNOSIS — K8000 Calculus of gallbladder with acute cholecystitis without obstruction: Secondary | ICD-10-CM

## 2017-01-15 NOTE — Progress Notes (Signed)
Carolyn Gaines; [id number]; [birth year]   HPI Patient is a 51 year old white female who was referred by care of by Dr. Juanetta Gaines for evaluation treatment of cholelithiasis.  The patient states that she has had multiple episodes of flank pain, but the right has been worse than the left.  She also has had multiple episodes of nausea.  She denies any vomiting.  She does have hiccups of unknown etiology.  She denies any fever, chills, or jaundice. She did have fatty food intolerance, but she has not had much in way of fatty foods since her coronary bypass surgery in March of this year.  She states she has 7/10 pain.  She did have pain when the ultrasound was used to probe her gallbladder.  She was recently seen by Drs. Carolyn Gaines and Carolyn Gaines for follow-up of her bypass surgery.  She states that they are aware that she was being evaluated for cholelithiasis. Past Medical History:  Diagnosis Date  . Anxiety   . Arthritis   . Depression   . Fibromyalgia   . MI (myocardial infarction) (HCC) 01/2016    Past Surgical History:  Procedure Laterality Date  . ABDOMINAL HYSTERECTOMY    . CARDIAC CATHETERIZATION N/A 02/19/2016   Procedure: Left Heart Cath and Coronary Angiography;  Surgeon: Yates Decamp, MD;  Location: [facility] INVASIVE CV LAB;  Service: Cardiovascular;  Laterality: N/A;  . CARDIAC CATHETERIZATION N/A 02/19/2016   Procedure: Coronary Stent Intervention;  Surgeon: Yates Decamp, MD;  Location: [facility] INVASIVE CV LAB;  Service: Cardiovascular;  Laterality: N/A;  . cesarian    . CORONARY ARTERY BYPASS GRAFT N/A 10/26/2016   Procedure: CORONARY ARTERY BYPASS GRAFTING (CABG), ON PUMP, TIMES THREE, USING LEFT INTERNAL MAMMARY ARTERY AND ENDOSCOPICALLY HARVESTED RIGHT GREATER SAPHENOUS VEIN;  Surgeon: Alleen Borne, MD;  Location: [facility] OR;  Service: Open Heart Surgery;  Laterality: N/A;  LIMA-LAD SVG-RCA SVG-OM  . LEFT HEART CATH AND CORONARY ANGIOGRAPHY N/A 10/22/2016   Procedure: Left Heart Cath and Coronary Angiography;   Surgeon: Lennette Bihari, MD;  Location: [facility] INVASIVE CV LAB;  Service: Cardiovascular;  Laterality: N/A;  . TEE WITHOUT CARDIOVERSION N/A 10/26/2016   Procedure: TRANSESOPHAGEAL ECHOCARDIOGRAM (TEE);  Surgeon: Alleen Borne, MD;  Location: [facility] OR;  Service: Open Heart Surgery;  Laterality: N/A;    Family History  Problem Relation Age of Onset  . COPD Mother   . Arthritis Mother   . Heart disease Mother   . Psoriasis Father     Current Outpatient Prescriptions on File Prior to Visit  Medication Sig Dispense Refill  . acetaminophen (TYLENOL) 325 MG tablet Take 2 tablets (650 mg total) by mouth every 6 (six) hours as needed for headache.    Marland Kitchen aspirin EC 81 MG EC tablet Take 1 tablet (81 mg total) by mouth daily. 30 tablet 11  . carvedilol (COREG) 3.125 MG tablet Take 1 tablet (3.125 mg total) by mouth 2 (two) times daily with a meal. 60 tablet 6  . escitalopram (LEXAPRO) 20 MG tablet Take 1 tablet (20 mg total) by mouth daily. 30 tablet 0  . Magnesium 200 MG TABS Take 1 tablet (200 mg total) by mouth 2 (two) times daily. 60 each 6  . nitroGLYCERIN (NITROSTAT) 0.4 MG SL tablet PLACE 1 T UNDER THE TONGUE Q 5 MINUTES FOR 3 DOSES AS NEEDED FOR CHEST PAIN  1  . potassium chloride SA (K-DUR,KLOR-CON) 20 MEQ tablet Take 1 tablet (20 mEq total) by mouth 3 (three) times daily. 90 tablet  6  . rosuvastatin (CRESTOR) 40 MG tablet Take 1 tablet (40 mg total) by mouth daily at 6 PM. 30 tablet 1  . ticagrelor (BRILINTA) 90 MG TABS tablet Take 1 tablet (90 mg total) by mouth 2 (two) times daily. 60 tablet 6   No current facility-administered medications on file prior to visit.     Allergies  Allergen Reactions  . Bee Venom Other (See Comments)    Pus sites at injection  . Adhesive [Tape] Itching and Swelling    Please use paper tape  . Triple Antibiotic [Bacitracin-Neomycin-Polymyxin] Rash    History  Alcohol Use No    History  Smoking Status  . Former Smoker  . Quit date: 03/21/2011   Smokeless Tobacco  . Never Used    Review of Systems  Constitutional: Negative.   HENT: Positive for sinus pain.   Eyes: Negative.   Respiratory: Positive for shortness of breath and wheezing.   Cardiovascular: Negative.   Gastrointestinal: Positive for abdominal pain, heartburn and nausea.  Genitourinary: Negative.   Musculoskeletal: Positive for back pain and joint pain.  Skin: Negative.   Neurological: Positive for headaches.  Endo/Heme/Allergies: Bruises/bleeds easily.  Psychiatric/Behavioral: Negative.     Objective   Vitals:   01/15/17 1441  BP: 116/80  Pulse: 84  Resp: 18  Temp: 98.4 F (36.9 C)    Physical Exam  Constitutional: She is oriented to person, place, and time and well-developed, well-nourished, and in no distress.  HENT:  Head: Normocephalic and atraumatic.  Eyes: No scleral icterus.  Neck: Normal range of motion. Neck supple.  Cardiovascular: Normal rate, regular rhythm and normal heart sounds.   No murmur heard. Pulmonary/Chest: Effort normal and breath sounds normal. She has no wheezes. She has no rales.  Abdominal: Soft. Bowel sounds are normal. She exhibits no distension. There is tenderness.  Tender over the right upper quadrant to palpation.  No rigidity noted.  Neurological: She is alert and oriented to person, place, and time.  Skin: Skin is warm and dry.  Vitals reviewed.  Drs. Carolyn Gaines and Carolyn Gaines's notes reviewed. U/S report reviewed. Assessment    Cholelithiasis, atypical symptoms, but I do not have an explanation to explain her constellation of symptoms. Plan     As she does have cholelithiasis and has some biliary colic, I will proceed with a laparoscopic cholecystectomy.  The risks and benefits of the procedure including bleeding, infection, hepatobiliary injury, the possibility of an open procedure were fully explained to the patient, who gave informed consent.  She needs to stop her Brilinta five days before her procedure.

## 2017-01-15 NOTE — Patient Instructions (Signed)

## 2017-01-15 NOTE — H&P (Signed)
Carolyn Gaines; [id number]; 05/04/1966   HPI Patient is a 51-year-old white female who was referred by care of by Dr. Hawkins for evaluation treatment of cholelithiasis.  The patient states that she has had multiple episodes of flank pain, but the right has been worse than the left.  She also has had multiple episodes of nausea.  She denies any vomiting.  She does have hiccups of unknown etiology.  She denies any fever, chills, or jaundice. She did have fatty food intolerance, but she has not had much in way of fatty foods since her coronary bypass surgery in March of this year.  She states she has 7/10 pain.  She did have pain when the ultrasound was used to probe her gallbladder.  She was recently seen by Drs. Bartle and Branch for follow-up of her bypass surgery.  She states that they are aware that she was being evaluated for cholelithiasis. Past Medical History:  Diagnosis Date  . Anxiety   . Arthritis   . Depression   . Fibromyalgia   . MI (myocardial infarction) (HCC) 01/2016    Past Surgical History:  Procedure Laterality Date  . ABDOMINAL HYSTERECTOMY    . CARDIAC CATHETERIZATION N/A 02/19/2016   Procedure: Left Heart Cath and Coronary Angiography;  Surgeon: Jay Ganji, MD;  Location: [facility] INVASIVE CV LAB;  Service: Cardiovascular;  Laterality: N/A;  . CARDIAC CATHETERIZATION N/A 02/19/2016   Procedure: Coronary Stent Intervention;  Surgeon: Jay Ganji, MD;  Location: [facility] INVASIVE CV LAB;  Service: Cardiovascular;  Laterality: N/A;  . cesarian    . CORONARY ARTERY BYPASS GRAFT N/A 10/26/2016   Procedure: CORONARY ARTERY BYPASS GRAFTING (CABG), ON PUMP, TIMES THREE, USING LEFT INTERNAL MAMMARY ARTERY AND ENDOSCOPICALLY HARVESTED RIGHT GREATER SAPHENOUS VEIN;  Surgeon: Bryan K Bartle, MD;  Location: [facility] OR;  Service: Open Heart Surgery;  Laterality: N/A;  LIMA-LAD SVG-RCA SVG-OM  . LEFT HEART CATH AND CORONARY ANGIOGRAPHY N/A 10/22/2016   Procedure: Left Heart Cath and Coronary Angiography;   Surgeon: Thomas A Kelly, MD;  Location: [facility] INVASIVE CV LAB;  Service: Cardiovascular;  Laterality: N/A;  . TEE WITHOUT CARDIOVERSION N/A 10/26/2016   Procedure: TRANSESOPHAGEAL ECHOCARDIOGRAM (TEE);  Surgeon: Bryan K Bartle, MD;  Location: [facility] OR;  Service: Open Heart Surgery;  Laterality: N/A;    Family History  Problem Relation Age of Onset  . COPD Mother   . Arthritis Mother   . Heart disease Mother   . Psoriasis Father     Current Outpatient Prescriptions on File Prior to Visit  Medication Sig Dispense Refill  . acetaminophen (TYLENOL) 325 MG tablet Take 2 tablets (650 mg total) by mouth every 6 (six) hours as needed for headache.    . aspirin EC 81 MG EC tablet Take 1 tablet (81 mg total) by mouth daily. 30 tablet 11  . carvedilol (COREG) 3.125 MG tablet Take 1 tablet (3.125 mg total) by mouth 2 (two) times daily with a meal. 60 tablet 6  . escitalopram (LEXAPRO) 20 MG tablet Take 1 tablet (20 mg total) by mouth daily. 30 tablet 0  . Magnesium 200 MG TABS Take 1 tablet (200 mg total) by mouth 2 (two) times daily. 60 each 6  . nitroGLYCERIN (NITROSTAT) 0.4 MG SL tablet PLACE 1 T UNDER THE TONGUE Q 5 MINUTES FOR 3 DOSES AS NEEDED FOR CHEST PAIN  1  . potassium chloride SA (K-DUR,KLOR-CON) 20 MEQ tablet Take 1 tablet (20 mEq total) by mouth 3 (three) times daily. 90 tablet   6  . rosuvastatin (CRESTOR) 40 MG tablet Take 1 tablet (40 mg total) by mouth daily at 6 PM. 30 tablet 1  . ticagrelor (BRILINTA) 90 MG TABS tablet Take 1 tablet (90 mg total) by mouth 2 (two) times daily. 60 tablet 6   No current facility-administered medications on file prior to visit.     Allergies  Allergen Reactions  . Bee Venom Other (See Comments)    Pus sites at injection  . Adhesive [Tape] Itching and Swelling    Please use paper tape  . Triple Antibiotic [Bacitracin-Neomycin-Polymyxin] Rash    History  Alcohol Use No    History  Smoking Status  . Former Smoker  . Quit date: 03/21/2011   Smokeless Tobacco  . Never Used    Review of Systems  Constitutional: Negative.   HENT: Positive for sinus pain.   Eyes: Negative.   Respiratory: Positive for shortness of breath and wheezing.   Cardiovascular: Negative.   Gastrointestinal: Positive for abdominal pain, heartburn and nausea.  Genitourinary: Negative.   Musculoskeletal: Positive for back pain and joint pain.  Skin: Negative.   Neurological: Positive for headaches.  Endo/Heme/Allergies: Bruises/bleeds easily.  Psychiatric/Behavioral: Negative.     Objective   Vitals:   01/15/17 1441  BP: 116/80  Pulse: 84  Resp: 18  Temp: 98.4 F (36.9 C)    Physical Exam  Constitutional: She is oriented to person, place, and time and well-developed, well-nourished, and in no distress.  HENT:  Head: Normocephalic and atraumatic.  Eyes: No scleral icterus.  Neck: Normal range of motion. Neck supple.  Cardiovascular: Normal rate, regular rhythm and normal heart sounds.   No murmur heard. Pulmonary/Chest: Effort normal and breath sounds normal. She has no wheezes. She has no rales.  Abdominal: Soft. Bowel sounds are normal. She exhibits no distension. There is tenderness.  Tender over the right upper quadrant to palpation.  No rigidity noted.  Neurological: She is alert and oriented to person, place, and time.  Skin: Skin is warm and dry.  Vitals reviewed.  Drs. Branch and Bartle's notes reviewed. U/S report reviewed. Assessment    Cholelithiasis, atypical symptoms, but I do not have an explanation to explain her constellation of symptoms. Plan     As she does have cholelithiasis and has some biliary colic, I will proceed with a laparoscopic cholecystectomy.  The risks and benefits of the procedure including bleeding, infection, hepatobiliary injury, the possibility of an open procedure were fully explained to the patient, who gave informed consent.  She needs to stop her Brilinta five days before her procedure. 

## 2017-01-18 NOTE — Patient Instructions (Addendum)
Carolyn Gaines  01/18/2017     @PREFPERIOPPHARMACY @   Your procedure is scheduled on  01/25/2017   Report to [facility] at  615  A.M.  Call this number if you have problems the morning of surgery:  [phone]   Remember:  Do not eat food or drink liquids after midnight.  Take these medicines the morning of surgery with A SIP OF WATER  Coreg, lexapro,  Do not wear jewelry, make-up or nail polish.  Do not wear lotions, powders, or perfumes, or deoderant.  Do not shave 48 hours prior to surgery.  Men may shave face and neck.  Do not bring valuables to the hospital.  [facility] is not responsible for any belongings or valuables.  Contacts, dentures or bridgework may not be worn into surgery.  Leave your suitcase in the car.  After surgery it may be brought to your room.  For patients admitted to the hospital, discharge time will be determined by your treatment team.  Patients discharged the day of surgery will not be allowed to drive home.   Name and phone number of your driver:   family Special instructions:  none  Please read over the following fact sheets that you were given. Anesthesia Post-op Instructions and Care and Recovery After Surgery       Laparoscopic Cholecystectomy Laparoscopic cholecystectomy is surgery to remove the gallbladder. The gallbladder is a pear-shaped organ that lies beneath the liver on the right side of the body. The gallbladder stores bile, which is a fluid that helps the body to digest fats. Cholecystectomy is often done for inflammation of the gallbladder (cholecystitis). This condition is usually caused by a buildup of gallstones (cholelithiasis) in the gallbladder. Gallstones can block the flow of bile, which can result in inflammation and pain. In severe cases, emergency surgery may be required. This procedure is done though small incisions in your abdomen (laparoscopic surgery). A thin scope with a camera (laparoscope) is  inserted through one incision. Thin surgical instruments are inserted through the other incisions. In some cases, a laparoscopic procedure may be turned into a type of surgery that is done through a larger incision (open surgery). Tell a health care provider about:  Any allergies you have.  All medicines you are taking, including vitamins, herbs, eye drops, creams, and over-the-counter medicines.  Any problems you or family members have had with anesthetic medicines.  Any blood disorders you have.  Any surgeries you have had.  Any medical conditions you have.  Whether you are pregnant or may be pregnant. What are the risks? Generally, this is a safe procedure. However, problems may occur, including:  Infection.  Bleeding.  Allergic reactions to medicines.  Damage to other structures or organs.  A stone remaining in the common bile duct. The common bile duct carries bile from the gallbladder into the small intestine.  A bile leak from the cyst duct that is clipped when your gallbladder is removed.  What happens before the procedure? Staying hydrated Follow instructions from your health care provider about hydration, which may include:  Up to 2 hours before the procedure - you may continue to drink clear liquids, such as water, clear fruit juice, black coffee, and plain tea.  Eating and drinking restrictions Follow instructions from your health care provider about eating and drinking, which may include:  8 hours before the procedure - stop eating heavy meals or foods such as meat,  fried foods, or fatty foods.  6 hours before the procedure - stop eating light meals or foods, such as toast or cereal.  6 hours before the procedure - stop drinking milk or drinks that contain milk.  2 hours before the procedure - stop drinking clear liquids.  Medicines  Ask your health care provider about: ? Changing or stopping your regular medicines. This is especially important if you  are taking diabetes medicines or blood thinners. ? Taking medicines such as aspirin and ibuprofen. These medicines can thin your blood. Do not take these medicines before your procedure if your health care provider instructs you not to.  You may be given antibiotic medicine to help prevent infection. General instructions  Let your health care provider know if you develop a cold or an infection before surgery.  Plan to have someone take you home from the hospital or clinic.  Ask your health care provider how your surgical site will be marked or identified. What happens during the procedure?  To reduce your risk of infection: ? Your health care team will wash or sanitize their hands. ? Your skin will be washed with soap. ? Hair may be removed from the surgical area.  An IV tube may be inserted into one of your veins.  You will be given one or more of the following: ? A medicine to help you relax (sedative). ? A medicine to make you fall asleep (general anesthetic).  A breathing tube will be placed in your mouth.  Your surgeon will make several small cuts (incisions) in your abdomen.  The laparoscope will be inserted through one of the small incisions. The camera on the laparoscope will send images to a TV screen (monitor) in the operating room. This lets your surgeon see inside your abdomen.  Air-like gas will be pumped into your abdomen. This will expand your abdomen to give the surgeon more room to perform the surgery.  Other tools that are needed for the procedure will be inserted through the other incisions. The gallbladder will be removed through one of the incisions.  Your common bile duct may be examined. If stones are found in the common bile duct, they may be removed.  After your gallbladder has been removed, the incisions will be closed with stitches (sutures), staples, or skin glue.  Your incisions may be covered with a bandage (dressing). The procedure may vary among  health care providers and hospitals. What happens after the procedure?  Your blood pressure, heart rate, breathing rate, and blood oxygen level will be monitored until the medicines you were given have worn off.  You will be given medicines as needed to control your pain.  Do not drive for 24 hours if you were given a sedative. This information is not intended to replace advice given to you by your health care provider. Make sure you discuss any questions you have with your health care provider. Document Released: 07/16/2005 Document Revised: 02/05/2016 Document Reviewed: 01/02/2016 Elsevier Interactive Patient Education  2018 [employer].  Laparoscopic Cholecystectomy, Care After This sheet gives you information about how to care for yourself after your procedure. Your health care provider may also give you more specific instructions. If you have problems or questions, contact your health care provider. What can I expect after the procedure? After the procedure, it is common to have:  Pain at your incision sites. You will be given medicines to control this pain.  Mild nausea or vomiting.  Bloating and possible shoulder  pain from the air-like gas that was used during the procedure.  Follow these instructions at home: Incision care   Follow instructions from your health care provider about how to take care of your incisions. Make sure you: ? Wash your hands with soap and water before you change your bandage (dressing). If soap and water are not available, use hand sanitizer. ? Change your dressing as told by your health care provider. ? Leave stitches (sutures), skin glue, or adhesive strips in place. These skin closures may need to be in place for 2 weeks or longer. If adhesive strip edges start to loosen and curl up, you may trim the loose edges. Do not remove adhesive strips completely unless your health care provider tells you to do that.  Do not take baths, swim, or use a hot  tub until your health care provider approves. Ask your health care provider if you can take showers. You may only be allowed to take sponge baths for bathing.  Check your incision area every day for signs of infection. Check for: ? More redness, swelling, or pain. ? More fluid or blood. ? Warmth. ? Pus or a bad smell. Activity  Do not drive or use heavy machinery while taking prescription pain medicine.  Do not lift anything that is heavier than 10 lb (4.5 kg) until your health care provider approves.  Do not play contact sports until your health care provider approves.  Do not drive for 24 hours if you were given a medicine to help you relax (sedative).  Rest as needed. Do not return to work or school until your health care provider approves. General instructions  Take over-the-counter and prescription medicines only as told by your health care provider.  To prevent or treat constipation while you are taking prescription pain medicine, your health care provider may recommend that you: ? Drink enough fluid to keep your urine clear or pale yellow. ? Take over-the-counter or prescription medicines. ? Eat foods that are high in fiber, such as fresh fruits and vegetables, whole grains, and beans. ? Limit foods that are high in fat and processed sugars, such as fried and sweet foods. Contact a health care provider if:  You develop a rash.  You have more redness, swelling, or pain around your incisions.  You have more fluid or blood coming from your incisions.  Your incisions feel warm to the touch.  You have pus or a bad smell coming from your incisions.  You have a fever.  One or more of your incisions breaks open. Get help right away if:  You have trouble breathing.  You have chest pain.  You have increasing pain in your shoulders.  You faint or feel dizzy when you stand.  You have severe pain in your abdomen.  You have nausea or vomiting that lasts for more than  one day.  You have leg pain. This information is not intended to replace advice given to you by your health care provider. Make sure you discuss any questions you have with your health care provider. Document Released: 07/16/2005 Document Revised: 02/04/2016 Document Reviewed: 01/02/2016 Elsevier Interactive Patient Education  2017 [employer]  General Anesthesia, Adult General anesthesia is the use of medicines to make a person "go to sleep" (be unconscious) for a medical procedure. General anesthesia is often recommended when a procedure:  Is long.  Requires you to be still or in an unusual position.  Is major and can cause you to lose blood.  Is impossible to do without general anesthesia.  The medicines used for general anesthesia are called general anesthetics. In addition to making you sleep, the medicines:  Prevent pain.  Control your blood pressure.  Relax your muscles.  Tell a health care provider about:  Any allergies you have.  All medicines you are taking, including vitamins, herbs, eye drops, creams, and over-the-counter medicines.  Any problems you or family members have had with anesthetic medicines.  Types of anesthetics you have had in the past.  Any bleeding disorders you have.  Any surgeries you have had.  Any medical conditions you have.  Any history of heart or lung conditions, such as heart failure, sleep apnea, or chronic obstructive pulmonary disease (COPD).  Whether you are pregnant or may be pregnant.  Whether you use tobacco, alcohol, marijuana, or street drugs.  Any history of Armed forces logistics/support/administrative officer.  Any history of depression or anxiety. What are the risks? Generally, this is a safe procedure. However, problems may occur, including:  Allergic reaction to anesthetics.  Lung and heart problems.  Inhaling food or liquids from your stomach into your lungs (aspiration).  Injury to nerves.  Waking up during your procedure and being  unable to move (rare).  Extreme agitation or a state of mental confusion (delirium) when you wake up from the anesthetic.  Air in the bloodstream, which can lead to stroke.  These problems are more likely to develop if you are having a major surgery or if you have an advanced medical condition. You can prevent some of these complications by answering all of your health care provider's questions thoroughly and by following all pre-procedure instructions. General anesthesia can cause side effects, including:  Nausea or vomiting  A sore throat from the breathing tube.  Feeling cold or shivery.  Feeling tired, washed out, or achy.  Sleepiness or drowsiness.  Confusion or agitation.  What happens before the procedure? Staying hydrated Follow instructions from your health care provider about hydration, which may include:  Up to 2 hours before the procedure - you may continue to drink clear liquids, such as water, clear fruit juice, black coffee, and plain tea.  Eating and drinking restrictions Follow instructions from your health care provider about eating and drinking, which may include:  8 hours before the procedure - stop eating heavy meals or foods such as meat, fried foods, or fatty foods.  6 hours before the procedure - stop eating light meals or foods, such as toast or cereal.  6 hours before the procedure - stop drinking milk or drinks that contain milk.  2 hours before the procedure - stop drinking clear liquids.  Medicines  Ask your health care provider about: ? Changing or stopping your regular medicines. This is especially important if you are taking diabetes medicines or blood thinners. ? Taking medicines such as aspirin and ibuprofen. These medicines can thin your blood. Do not take these medicines before your procedure if your health care provider instructs you not to. ? Taking new dietary supplements or medicines. Do not take these during the week before your  procedure unless your health care provider approves them.  If you are told to take a medicine or to continue taking a medicine on the day of the procedure, take the medicine with sips of water. General instructions   Ask if you will be going home the same day, the following day, or after a longer hospital stay. ? Plan to have someone take you home. ? Plan  to have someone stay with you for the first 24 hours after you leave the hospital or clinic.  For 3-6 weeks before the procedure, try not to use any tobacco products, such as cigarettes, chewing tobacco, and e-cigarettes.  You may brush your teeth on the morning of the procedure, but make sure to spit out the toothpaste. What happens during the procedure?  You will be given anesthetics through a mask and through an IV tube in one of your veins.  You may receive medicine to help you relax (sedative).  As soon as you are asleep, a breathing tube may be used to help you breathe.  An anesthesia specialist will stay with you throughout the procedure. He or she will help keep you comfortable and safe by continuing to give you medicines and adjusting the amount of medicine that you get. He or she will also watch your blood pressure, pulse, and oxygen levels to make sure that the anesthetics do not cause any problems.  If a breathing tube was used to help you breathe, it will be removed before you wake up. The procedure may vary among health care providers and hospitals. What happens after the procedure?  You will wake up, often slowly, after the procedure is complete, usually in a recovery area.  Your blood pressure, heart rate, breathing rate, and blood oxygen level will be monitored until the medicines you were given have worn off.  You may be given medicine to help you calm down if you feel anxious or agitated.  If you will be going home the same day, your health care provider may check to make sure you can stand, drink, and  urinate.  Your health care providers will treat your pain and side effects before you go home.  Do not drive for 24 hours if you received a sedative.  You may: ? Feel nauseous and vomit. ? Have a sore throat. ? Have mental slowness. ? Feel cold or shivery. ? Feel sleepy. ? Feel tired. ? Feel sore or achy, even in parts of your body where you did not have surgery. This information is not intended to replace advice given to you by your health care provider. Make sure you discuss any questions you have with your health care provider. Document Released: 10/23/2007 Document Revised: 12/27/2015 Document Reviewed: 06/30/2015 Elsevier Interactive Patient Education  2018 [employer] Anesthesia, Adult, Care After These instructions provide you with information about caring for yourself after your procedure. Your health care provider may also give you more specific instructions. Your treatment has been planned according to current medical practices, but problems sometimes occur. Call your health care provider if you have any problems or questions after your procedure. What can I expect after the procedure? After the procedure, it is common to have:  Vomiting.  A sore throat.  Mental slowness.  It is common to feel:  Nauseous.  Cold or shivery.  Sleepy.  Tired.  Sore or achy, even in parts of your body where you did not have surgery.  Follow these instructions at home: For at least 24 hours after the procedure:  Do not: ? Participate in activities where you could fall or become injured. ? Drive. ? Use heavy machinery. ? Drink alcohol. ? Take sleeping pills or medicines that cause drowsiness. ? Make important decisions or sign legal documents. ? Take care of children on your own.  Rest. Eating and drinking  If you vomit, drink water, juice, or soup when you can  drink without vomiting.  Drink enough fluid to keep your urine clear or pale yellow.  Make sure you  have little or no nausea before eating solid foods.  Follow the diet recommended by your health care provider. General instructions  Have a responsible adult stay with you until you are awake and alert.  Return to your normal activities as told by your health care provider. Ask your health care provider what activities are safe for you.  Take over-the-counter and prescription medicines only as told by your health care provider.  If you smoke, do not smoke without supervision.  Keep all follow-up visits as told by your health care provider. This is important. Contact a health care provider if:  You continue to have nausea or vomiting at home, and medicines are not helpful.  You cannot drink fluids or start eating again.  You cannot urinate after 8-12 hours.  You develop a skin rash.  You have fever.  You have increasing redness at the site of your procedure. Get help right away if:  You have difficulty breathing.  You have chest pain.  You have unexpected bleeding.  You feel that you are having a life-threatening or urgent problem. This information is not intended to replace advice given to you by your health care provider. Make sure you discuss any questions you have with your health care provider. Document Released: 10/22/2000 Document Revised: 12/19/2015 Document Reviewed: 06/30/2015 Elsevier Interactive Patient Education  [employer].

## 2017-01-22 ENCOUNTER — Telehealth: Payer: Self-pay | Admitting: Cardiology

## 2017-01-22 ENCOUNTER — Encounter (HOSPITAL_COMMUNITY): Payer: Self-pay

## 2017-01-22 ENCOUNTER — Encounter (HOSPITAL_COMMUNITY)
Admission: RE | Admit: 2017-01-22 | Discharge: 2017-01-22 | Disposition: A | Discharge status: Home / Self Care | Payer: BLUE CROSS/BLUE SHIELD | Source: Ambulatory Visit | Attending: General Surgery | Admitting: General Surgery

## 2017-01-22 DIAGNOSIS — K8020 Calculus of gallbladder without cholecystitis without obstruction: Secondary | ICD-10-CM

## 2017-01-22 DIAGNOSIS — M797 Fibromyalgia: Secondary | ICD-10-CM | POA: Diagnosis not present

## 2017-01-22 DIAGNOSIS — Z01812 Encounter for preprocedural laboratory examination: Secondary | ICD-10-CM

## 2017-01-22 DIAGNOSIS — Z881 Allergy status to other antibiotic agents status: Secondary | ICD-10-CM | POA: Diagnosis not present

## 2017-01-22 DIAGNOSIS — K8070 Calculus of gallbladder and bile duct without cholecystitis without obstruction: Secondary | ICD-10-CM | POA: Diagnosis present

## 2017-01-22 DIAGNOSIS — I252 Old myocardial infarction: Secondary | ICD-10-CM | POA: Diagnosis not present

## 2017-01-22 DIAGNOSIS — Z87891 Personal history of nicotine dependence: Secondary | ICD-10-CM | POA: Diagnosis not present

## 2017-01-22 DIAGNOSIS — K8010 Calculus of gallbladder with chronic cholecystitis without obstruction: Secondary | ICD-10-CM | POA: Diagnosis not present

## 2017-01-22 DIAGNOSIS — Z91030 Bee allergy status: Secondary | ICD-10-CM | POA: Diagnosis not present

## 2017-01-22 DIAGNOSIS — Z79899 Other long term (current) drug therapy: Secondary | ICD-10-CM | POA: Diagnosis not present

## 2017-01-22 DIAGNOSIS — F329 Major depressive disorder, single episode, unspecified: Secondary | ICD-10-CM | POA: Diagnosis not present

## 2017-01-22 DIAGNOSIS — Z955 Presence of coronary angioplasty implant and graft: Secondary | ICD-10-CM | POA: Diagnosis not present

## 2017-01-22 DIAGNOSIS — M1990 Unspecified osteoarthritis, unspecified site: Secondary | ICD-10-CM | POA: Diagnosis not present

## 2017-01-22 DIAGNOSIS — F419 Anxiety disorder, unspecified: Secondary | ICD-10-CM | POA: Diagnosis not present

## 2017-01-22 DIAGNOSIS — Z7982 Long term (current) use of aspirin: Secondary | ICD-10-CM | POA: Diagnosis not present

## 2017-01-22 LAB — COMPREHENSIVE METABOLIC PANEL
ALT: 10 U/L — ABNORMAL LOW (ref 14–54)
ANION GAP: 5 (ref 5–15)
AST: 19 U/L (ref 15–41)
Albumin: 3.6 g/dL (ref 3.5–5.0)
Alkaline Phosphatase: 82 U/L (ref 38–126)
BUN: 8 mg/dL (ref 6–20)
CHLORIDE: 111 mmol/L (ref 101–111)
CO2: 25 mmol/L (ref 22–32)
Calcium: 9.1 mg/dL (ref 8.9–10.3)
Creatinine, Ser: 0.72 mg/dL (ref 0.44–1.00)
Glucose, Bld: 84 mg/dL (ref 65–99)
POTASSIUM: 3.7 mmol/L (ref 3.5–5.1)
Sodium: 141 mmol/L (ref 135–145)
Total Bilirubin: 0.4 mg/dL (ref 0.3–1.2)
Total Protein: 7.4 g/dL (ref 6.5–8.1)

## 2017-01-22 LAB — CBC WITH DIFFERENTIAL/PLATELET
Basophils Absolute: 0 10*3/uL (ref 0.0–0.1)
Basophils Relative: 0 %
EOS ABS: 0.2 10*3/uL (ref 0.0–0.7)
EOS PCT: 5 %
HCT: 37.8 % (ref 36.0–46.0)
Hemoglobin: 11.4 g/dL — ABNORMAL LOW (ref 12.0–15.0)
LYMPHS ABS: 1.3 10*3/uL (ref 0.7–4.0)
LYMPHS PCT: 32 %
MCH: 26.1 pg (ref 26.0–34.0)
MCHC: 30.2 g/dL (ref 30.0–36.0)
MCV: 86.7 fL (ref 78.0–100.0)
MONO ABS: 0.3 10*3/uL (ref 0.1–1.0)
Monocytes Relative: 6 %
Neutro Abs: 2.3 10*3/uL (ref 1.7–7.7)
Neutrophils Relative %: 57 %
PLATELETS: 258 10*3/uL (ref 150–400)
RBC: 4.36 MIL/uL (ref 3.87–5.11)
RDW: 16.5 % — AB (ref 11.5–15.5)
WBC: 4.1 10*3/uL (ref 4.0–10.5)

## 2017-01-22 NOTE — Telephone Encounter (Signed)
Patient ok for gallbladder surgery. Ok to hold brillinta, typically hold 7 days prior to surgery. Can hold aspirin if neccesary.   Dominga Ferry MD

## 2017-01-22 NOTE — Telephone Encounter (Signed)
Patient is having gall-bladder surgery on 01/22/17 at [facility]. They need cardiology clearance ASAP.  Please call Kim  [phone] (ext)

## 2017-01-23 NOTE — Progress Notes (Signed)
Brilinta stopped on 01/19/2017.  Discussed with Dr. Lovell Sheehan, Dr. Wyline Mood and Dr. Veatrice Kells. Pt is stable for surgery.

## 2017-01-25 ENCOUNTER — Encounter (HOSPITAL_COMMUNITY)
Admission: RE | Disposition: A | Discharge status: Home / Self Care | Payer: Self-pay | Source: Ambulatory Visit | Attending: General Surgery

## 2017-01-25 ENCOUNTER — Ambulatory Visit (HOSPITAL_COMMUNITY): Payer: BLUE CROSS/BLUE SHIELD | Admitting: Anesthesiology

## 2017-01-25 ENCOUNTER — Ambulatory Visit (HOSPITAL_COMMUNITY)
Admission: RE | Admit: 2017-01-25 | Discharge: 2017-01-25 | Disposition: A | Discharge status: Home / Self Care | Payer: BLUE CROSS/BLUE SHIELD | Source: Ambulatory Visit | Attending: General Surgery | Admitting: General Surgery

## 2017-01-25 ENCOUNTER — Encounter (HOSPITAL_COMMUNITY): Payer: Self-pay | Admitting: *Deleted

## 2017-01-25 DIAGNOSIS — Z91030 Bee allergy status: Secondary | ICD-10-CM | POA: Diagnosis not present

## 2017-01-25 DIAGNOSIS — K8010 Calculus of gallbladder with chronic cholecystitis without obstruction: Secondary | ICD-10-CM | POA: Diagnosis not present

## 2017-01-25 DIAGNOSIS — Z87891 Personal history of nicotine dependence: Secondary | ICD-10-CM | POA: Diagnosis not present

## 2017-01-25 DIAGNOSIS — K8020 Calculus of gallbladder without cholecystitis without obstruction: Secondary | ICD-10-CM

## 2017-01-25 DIAGNOSIS — Z955 Presence of coronary angioplasty implant and graft: Secondary | ICD-10-CM | POA: Insufficient documentation

## 2017-01-25 DIAGNOSIS — Z881 Allergy status to other antibiotic agents status: Secondary | ICD-10-CM | POA: Insufficient documentation

## 2017-01-25 DIAGNOSIS — F329 Major depressive disorder, single episode, unspecified: Secondary | ICD-10-CM | POA: Diagnosis not present

## 2017-01-25 DIAGNOSIS — F419 Anxiety disorder, unspecified: Secondary | ICD-10-CM | POA: Diagnosis not present

## 2017-01-25 DIAGNOSIS — Z7982 Long term (current) use of aspirin: Secondary | ICD-10-CM | POA: Insufficient documentation

## 2017-01-25 DIAGNOSIS — Z79899 Other long term (current) drug therapy: Secondary | ICD-10-CM | POA: Insufficient documentation

## 2017-01-25 DIAGNOSIS — M1990 Unspecified osteoarthritis, unspecified site: Secondary | ICD-10-CM | POA: Diagnosis not present

## 2017-01-25 DIAGNOSIS — I252 Old myocardial infarction: Secondary | ICD-10-CM | POA: Diagnosis not present

## 2017-01-25 DIAGNOSIS — M797 Fibromyalgia: Secondary | ICD-10-CM | POA: Diagnosis not present

## 2017-01-25 HISTORY — PX: CHOLECYSTECTOMY: SHX55

## 2017-01-25 SURGERY — LAPAROSCOPIC CHOLECYSTECTOMY
Anesthesia: General

## 2017-01-25 MED ORDER — OXYCODONE HCL 5 MG/5ML PO SOLN: 5.0000 mg | Freq: Once | ORAL | Status: AC | PRN

## 2017-01-25 MED ORDER — HEMOSTATIC AGENTS (NO CHARGE) OPTIME
TOPICAL | Status: DC | PRN
  Administered 2017-01-25: 1 via TOPICAL

## 2017-01-25 MED ORDER — EPHEDRINE SULFATE 50 MG/ML IJ SOLN
INTRAMUSCULAR | Status: AC
  Filled 2017-01-25: qty 1

## 2017-01-25 MED ORDER — BUPIVACAINE HCL (PF) 0.5 % IJ SOLN
INTRAMUSCULAR | Status: DC | PRN
  Administered 2017-01-25: 10 mL

## 2017-01-25 MED ORDER — BUPIVACAINE HCL (PF) 0.5 % IJ SOLN
INTRAMUSCULAR | Status: AC
  Filled 2017-01-25: qty 30

## 2017-01-25 MED ORDER — MIDAZOLAM HCL 2 MG/2ML IJ SOLN
1.0000 mg | Freq: Once | INTRAMUSCULAR | Status: AC | PRN
  Administered 2017-01-25: 2 mg via INTRAVENOUS
  Filled 2017-01-25: qty 2

## 2017-01-25 MED ORDER — ONDANSETRON HCL 4 MG/2ML IJ SOLN
4.0000 mg | Freq: Once | INTRAMUSCULAR | Status: DC
  Filled 2017-01-25: qty 2

## 2017-01-25 MED ORDER — ONDANSETRON 4 MG PO TBDP
4.0000 mg | ORAL_TABLET | Freq: Once | ORAL | Status: AC
  Administered 2017-01-25: 4 mg via ORAL
  Filled 2017-01-25: qty 1

## 2017-01-25 MED ORDER — SODIUM CHLORIDE 0.9 % IR SOLN
Status: DC | PRN
  Administered 2017-01-25: 1000 mL

## 2017-01-25 MED ORDER — LACTATED RINGERS IV SOLN
INTRAVENOUS | Status: DC
  Administered 2017-01-25: 1000 mL via INTRAVENOUS

## 2017-01-25 MED ORDER — PROPOFOL 10 MG/ML IV BOLUS
INTRAVENOUS | Status: DC | PRN
  Administered 2017-01-25: 160 mg via INTRAVENOUS

## 2017-01-25 MED ORDER — FENTANYL CITRATE (PF) 100 MCG/2ML IJ SOLN
25.0000 ug | INTRAMUSCULAR | Status: DC | PRN
  Administered 2017-01-25 (×2): 50 ug via INTRAVENOUS
  Filled 2017-01-25: qty 2

## 2017-01-25 MED ORDER — PHENYLEPHRINE 40 MCG/ML (10ML) SYRINGE FOR IV PUSH (FOR BLOOD PRESSURE SUPPORT)
PREFILLED_SYRINGE | INTRAVENOUS | Status: AC
  Filled 2017-01-25: qty 10

## 2017-01-25 MED ORDER — SODIUM CHLORIDE 0.9 % IJ SOLN
INTRAMUSCULAR | Status: AC
  Filled 2017-01-25: qty 10

## 2017-01-25 MED ORDER — CIPROFLOXACIN IN D5W 400 MG/200ML IV SOLN
INTRAVENOUS | Status: AC
  Filled 2017-01-25: qty 200

## 2017-01-25 MED ORDER — CHLORHEXIDINE GLUCONATE CLOTH 2 % EX PADS: 6.0000 | MEDICATED_PAD | Freq: Once | CUTANEOUS | Status: DC

## 2017-01-25 MED ORDER — CIPROFLOXACIN IN D5W 400 MG/200ML IV SOLN
400.0000 mg | INTRAVENOUS | Status: AC
  Administered 2017-01-25: 400 mg via INTRAVENOUS

## 2017-01-25 MED ORDER — POVIDONE-IODINE 10 % EX OINT
TOPICAL_OINTMENT | CUTANEOUS | Status: AC
  Filled 2017-01-25: qty 1

## 2017-01-25 MED ORDER — PROPOFOL 10 MG/ML IV BOLUS
INTRAVENOUS | Status: AC
  Filled 2017-01-25: qty 20

## 2017-01-25 MED ORDER — LIDOCAINE HCL (PF) 1 % IJ SOLN
INTRAMUSCULAR | Status: DC | PRN
  Administered 2017-01-25: 50 mg

## 2017-01-25 MED ORDER — POVIDONE-IODINE 10 % OINT PACKET
TOPICAL_OINTMENT | CUTANEOUS | Status: DC | PRN
  Administered 2017-01-25: 1 via TOPICAL

## 2017-01-25 MED ORDER — MIDAZOLAM HCL 2 MG/2ML IJ SOLN: 1.0000 mg | Freq: Once | INTRAMUSCULAR | Status: DC | PRN

## 2017-01-25 MED ORDER — GLYCOPYRROLATE 0.2 MG/ML IJ SOLN
INTRAMUSCULAR | Status: AC
  Filled 2017-01-25: qty 2

## 2017-01-25 MED ORDER — ROCURONIUM BROMIDE 100 MG/10ML IV SOLN
INTRAVENOUS | Status: DC | PRN
  Administered 2017-01-25: 30 mg via INTRAVENOUS

## 2017-01-25 MED ORDER — FENTANYL CITRATE (PF) 250 MCG/5ML IJ SOLN
INTRAMUSCULAR | Status: AC
  Filled 2017-01-25: qty 5

## 2017-01-25 MED ORDER — KETOROLAC TROMETHAMINE 30 MG/ML IJ SOLN
30.0000 mg | Freq: Once | INTRAMUSCULAR | Status: AC
  Administered 2017-01-25: 30 mg via INTRAVENOUS

## 2017-01-25 MED ORDER — ROCURONIUM BROMIDE 50 MG/5ML IV SOLN
INTRAVENOUS | Status: AC
  Filled 2017-01-25: qty 1

## 2017-01-25 MED ORDER — NEOSTIGMINE METHYLSULFATE 10 MG/10ML IV SOLN
INTRAVENOUS | Status: DC | PRN
  Administered 2017-01-25: 3 mg via INTRAVENOUS

## 2017-01-25 MED ORDER — LIDOCAINE HCL (PF) 1 % IJ SOLN
INTRAMUSCULAR | Status: AC
  Filled 2017-01-25: qty 5

## 2017-01-25 MED ORDER — EPHEDRINE SULFATE 50 MG/ML IJ SOLN
INTRAMUSCULAR | Status: DC | PRN
  Administered 2017-01-25 (×3): 5 mg via INTRAVENOUS

## 2017-01-25 MED ORDER — NEOSTIGMINE METHYLSULFATE 10 MG/10ML IV SOLN
INTRAVENOUS | Status: AC
  Filled 2017-01-25: qty 1

## 2017-01-25 MED ORDER — FENTANYL CITRATE (PF) 100 MCG/2ML IJ SOLN
INTRAMUSCULAR | Status: DC | PRN
  Administered 2017-01-25: 50 ug via INTRAVENOUS
  Administered 2017-01-25: 100 ug via INTRAVENOUS

## 2017-01-25 MED ORDER — KETOROLAC TROMETHAMINE 30 MG/ML IJ SOLN
INTRAMUSCULAR | Status: AC
  Filled 2017-01-25: qty 1

## 2017-01-25 MED ORDER — ACETAMINOPHEN 325 MG PO TABS
650.0000 mg | ORAL_TABLET | Freq: Once | ORAL | Status: AC
  Administered 2017-01-25: 650 mg via ORAL
  Filled 2017-01-25: qty 2

## 2017-01-25 MED ORDER — GLYCOPYRROLATE 0.2 MG/ML IJ SOLN
INTRAMUSCULAR | Status: DC | PRN
  Administered 2017-01-25: 0.4 mg via INTRAVENOUS

## 2017-01-25 MED ORDER — OXYCODONE HCL 5 MG PO TABS
5.0000 mg | ORAL_TABLET | Freq: Once | ORAL | Status: AC | PRN
  Administered 2017-01-25: 5 mg via ORAL
  Filled 2017-01-25: qty 1

## 2017-01-25 MED ORDER — OXYCODONE HCL 5 MG PO TABS: 5.0000 mg | ORAL_TABLET | ORAL | 0 refills | Status: DC | PRN

## 2017-01-25 MED ORDER — PHENYLEPHRINE HCL 10 MG/ML IJ SOLN
INTRAMUSCULAR | Status: DC | PRN
  Administered 2017-01-25 (×3): 80 ug via INTRAVENOUS
  Administered 2017-01-25: 120 ug via INTRAVENOUS

## 2017-01-25 SURGICAL SUPPLY — 42 items
APPLIER CLIP LAPSCP 10X32 DD (CLIP) ×2 IMPLANT
BAG HAMPER (MISCELLANEOUS) ×2 IMPLANT
BAG RETRIEVAL 10 (BASKET) ×1
CHLORAPREP W/TINT 26ML (MISCELLANEOUS) ×2 IMPLANT
CLOTH BEACON ORANGE TIMEOUT ST (SAFETY) ×2 IMPLANT
COVER LIGHT HANDLE STERIS (MISCELLANEOUS) ×4 IMPLANT
DECANTER SPIKE VIAL GLASS SM (MISCELLANEOUS) ×2 IMPLANT
ELECT REM PT RETURN 9FT ADLT (ELECTROSURGICAL) ×2
ELECTRODE REM PT RTRN 9FT ADLT (ELECTROSURGICAL) ×1 IMPLANT
FILTER SMOKE EVAC LAPAROSHD (FILTER) ×2 IMPLANT
FORMALIN 10 PREFIL 120ML (MISCELLANEOUS) ×2 IMPLANT
GLOVE BIOGEL PI IND STRL 7.0 (GLOVE) ×1 IMPLANT
GLOVE BIOGEL PI INDICATOR 7.0 (GLOVE) ×4
GLOVE SURG SS PI 7.5 STRL IVOR (GLOVE) ×4 IMPLANT
GOWN STRL REUS W/ TWL XL LVL3 (GOWN DISPOSABLE) ×1 IMPLANT
GOWN STRL REUS W/TWL LRG LVL3 (GOWN DISPOSABLE) ×4 IMPLANT
GOWN STRL REUS W/TWL XL LVL3 (GOWN DISPOSABLE) ×2
HEMOSTAT SNOW SURGICEL 2X4 (HEMOSTASIS) ×2 IMPLANT
INST SET LAPROSCOPIC AP (KITS) ×2 IMPLANT
IV NS IRRIG 3000ML ARTHROMATIC (IV SOLUTION) IMPLANT
KIT ROOM TURNOVER APOR (KITS) ×2 IMPLANT
MANIFOLD NEPTUNE II (INSTRUMENTS) ×2 IMPLANT
NDL INSUFFLATION 14GA 120MM (NEEDLE) ×1 IMPLANT
NEEDLE INSUFFLATION 14GA 120MM (NEEDLE) ×2 IMPLANT
NS IRRIG 1000ML POUR BTL (IV SOLUTION) ×2 IMPLANT
PACK LAP CHOLE LZT030E (CUSTOM PROCEDURE TRAY) ×2 IMPLANT
PAD ARMBOARD 7.5X6 YLW CONV (MISCELLANEOUS) ×2 IMPLANT
SET BASIN LINEN APH (SET/KITS/TRAYS/PACK) ×2 IMPLANT
SET TUBE IRRIG SUCTION NO TIP (IRRIGATION / IRRIGATOR) IMPLANT
SLEEVE ENDOPATH XCEL 5M (ENDOMECHANICALS) ×2 IMPLANT
SPONGE GAUZE 2X2 8PLY STRL LF (GAUZE/BANDAGES/DRESSINGS) ×5 IMPLANT
STAPLER VISISTAT (STAPLE) ×2 IMPLANT
SUT VICRYL 0 UR6 27IN ABS (SUTURE) ×2 IMPLANT
SYS BAG RETRIEVAL 10MM (BASKET) ×1
SYSTEM BAG RETRIEVAL 10MM (BASKET) ×1 IMPLANT
TAPE PAPER 3X10 WHT MICROPORE (GAUZE/BANDAGES/DRESSINGS) ×1 IMPLANT
TROCAR ENDO BLADELESS 11MM (ENDOMECHANICALS) ×2 IMPLANT
TROCAR XCEL NON-BLD 5MMX100MML (ENDOMECHANICALS) ×2 IMPLANT
TROCAR XCEL UNIV SLVE 11M 100M (ENDOMECHANICALS) ×2 IMPLANT
TUBE CONNECTING 12X1/4 (SUCTIONS) ×2 IMPLANT
TUBING INSUFFLATION (TUBING) ×2 IMPLANT
WARMER LAPAROSCOPE (MISCELLANEOUS) ×2 IMPLANT

## 2017-01-25 NOTE — Anesthesia Procedure Notes (Signed)
Procedure Name: Intubation Date/Time: 01/25/2017 7:39 AM Performed by: Raenette Rover Pre-anesthesia Checklist: Patient identified, Emergency Drugs available, Suction available and Patient being monitored Patient Re-evaluated:Patient Re-evaluated prior to inductionOxygen Delivery Method: Circle system utilized Preoxygenation: Pre-oxygenation with 100% oxygen Intubation Type: IV induction Ventilation: Mask ventilation without difficulty Laryngoscope Size: Mac and 3 Grade View: Grade I Tube type: Oral Tube size: 7.0 mm Number of attempts: 1 Airway Equipment and Method: Stylet Placement Confirmation: ETT inserted through vocal cords under direct vision,  positive ETCO2,  CO2 detector and breath sounds checked- equal and bilateral Secured at: 20 cm Tube secured with: Tape Dental Injury: Teeth and Oropharynx as per pre-operative assessment

## 2017-01-25 NOTE — Op Note (Signed)
Patient:  Carolyn Gaines  DOB:  Nov 03, 1965  MRN:  [id number]   Preop Diagnosis:  Biliary colic, cholelithiasis  Postop Diagnosis:  Same  Procedure:  Laparoscopic cholecystectomy  Surgeon:  Franky Macho, M.D.  Anes:  Gen. endotracheal  Indications:  Patient is a 51 year old white female who presents with biliary colic secondary to cholelithiasis. The risks and benefits of the procedure including bleeding, infection, hepatobiliary injury, and the possibility of an open procedure were fully explained to the patient, who gave informed consent.  Procedure note:  The patient was placed in the supine position. After induction of general endotracheal anesthesia, the abdomen was prepped and draped using the usual sterile technique with DuraPrep. Surgical site confirmation was performed.  A supraumbilical incision was made down to the fascia. A Veress needle was introduced into the abdominal cavity and confirmation of placement was done using the saline drop test. The abdomen was then insufflated to 16 mmHg pressure. An 11 mm trocar was introduced into the abdominal cavity under direct visualization without difficulty. The patient was placed in reverse Trendelenburg position and an additional 11 mm trocar was placed the epigastric region and 5 mm trochars were placed the right upper quadrant and right flank regions. Liver was inspected and noted to be within normal limits. The gallbladder was retracted in a dynamic fashion in order to provide a critical view of the triangle of Calot. There were adhesions of bowel around the gallbladder. The cystic duct was first identified. Its juncture to the infundibulum was fully identified. Endoclips were placed proximally and distally on the cystic duct, and the cystic duct was divided. This was likewise done to the cystic artery. The gallbladder was freed away from the gallbladder fossa using Bovie electrocautery. The gallbladder was delivered through the epigastric  trocar site using an Endo Catch bag. The gallbladder fossa was inspected and no abnormal bleeding or bile leakage was noted. Surgicel was placed in the gallbladder fossa. All fluid and air were then evacuated from the abdominal cavity prior to the removal of the trochars.  All wounds were irrigated with normal saline. All wounds were injected with 0.5% Sensorcaine. The supraumbilical fascia was reapproximated using an 0 Vicryl interrupted suture. All skin incisions were closed using staples. Betadine ointment and dry sterile dressings were applied.  All tape and needle counts were correct at the end of procedure. The patient was extubated in the operating room and transferred to PACU in stable condition.  Complications:  None  EBL:  Minimal  Specimen:  Gallbladder

## 2017-01-25 NOTE — Transfer of Care (Signed)
Immediate Anesthesia Transfer of Care Note  Patient: Carolyn Gaines  Procedure(s) Performed: Procedure(s): LAPAROSCOPIC CHOLECYSTECTOMY (N/A)  Patient Location: PACU  Anesthesia Type:General  Level of Consciousness: awake, alert , oriented, drowsy and patient cooperative  Airway & Oxygen Therapy: Patient Spontanous Breathing and Patient connected to face mask oxygen  Post-op Assessment: Report given to RN and Post -op Vital signs reviewed and stable  Post vital signs: Reviewed and stable  Last Vitals:  Vitals:   01/25/17 0725 01/25/17 0820  BP: 95/63   Pulse:  70  Resp: (!) 27 18  Temp:  36.6 C    Last Pain:  Vitals:   01/25/17 0648  TempSrc: Oral      Patients Stated Pain Goal: 7 (01/25/17 5300)  Complications: No apparent anesthesia complications

## 2017-01-25 NOTE — Discharge Instructions (Signed)
Laparoscopic Cholecystectomy, Care After °This sheet gives you information about how to care for yourself after your procedure. Your health care provider may also give you more specific instructions. If you have problems or questions, contact your health care provider. °What can I expect after the procedure? °After the procedure, it is common to have: °· Pain at your incision sites. You will be given medicines to control this pain. °· Mild nausea or vomiting. °· Bloating and possible shoulder pain from the air-like gas that was used during the procedure. °Follow these instructions at home: °Incision care  ° °· Follow instructions from your health care provider about how to take care of your incisions. Make sure you: °¨ Wash your hands with soap and water before you change your bandage (dressing). If soap and water are not available, use hand sanitizer. °¨ Change your dressing as told by your health care provider. °¨ Leave stitches (sutures), skin glue, or adhesive strips in place. These skin closures may need to be in place for 2 weeks or longer. If adhesive strip edges start to loosen and curl up, you may trim the loose edges. Do not remove adhesive strips completely unless your health care provider tells you to do that. °· Do not take baths, swim, or use a hot tub until your health care provider approves. Ask your health care provider if you can take showers. You may only be allowed to take sponge baths for bathing. °· Check your incision area every day for signs of infection. Check for: °¨ More redness, swelling, or pain. °¨ More fluid or blood. °¨ Warmth. °¨ Pus or a bad smell. °Activity  °· Do not drive or use heavy machinery while taking prescription pain medicine. °· Do not lift anything that is heavier than 10 lb (4.5 kg) until your health care provider approves. °· Do not play contact sports until your health care provider approves. °· Do not drive for 24 hours if you were given a medicine to help you relax  (sedative). °· Rest as needed. Do not return to work or school until your health care provider approves. °General instructions  °· Take over-the-counter and prescription medicines only as told by your health care provider. °· To prevent or treat constipation while you are taking prescription pain medicine, your health care provider may recommend that you: °¨ Drink enough fluid to keep your urine clear or pale yellow. °¨ Take over-the-counter or prescription medicines. °¨ Eat foods that are high in fiber, such as fresh fruits and vegetables, whole grains, and beans. °¨ Limit foods that are high in fat and processed sugars, such as fried and sweet foods. °Contact a health care provider if: °· You develop a rash. °· You have more redness, swelling, or pain around your incisions. °· You have more fluid or blood coming from your incisions. °· Your incisions feel warm to the touch. °· You have pus or a bad smell coming from your incisions. °· You have a fever. °· One or more of your incisions breaks open. °Get help right away if: °· You have trouble breathing. °· You have chest pain. °· You have increasing pain in your shoulders. °· You faint or feel dizzy when you stand. °· You have severe pain in your abdomen. °· You have nausea or vomiting that lasts for more than one day. °· You have leg pain. °This information is not intended to replace advice given to you by your health care provider. Make sure you discuss any   questions you have with your health care provider. °Document Released: 07/16/2005 Document Revised: 02/04/2016 Document Reviewed: 01/02/2016 °Elsevier Interactive Patient Education © 2017 [employer] ° °

## 2017-01-25 NOTE — Interval H&P Note (Signed)
History and Physical Interval Note:  01/25/2017 7:11 AM  Carolyn Gaines  has presented today for surgery, with the diagnosis of cholelithiasis  The various methods of treatment have been discussed with the patient and family. After consideration of risks, benefits and other options for treatment, the patient has consented to  Procedure(s): LAPAROSCOPIC CHOLECYSTECTOMY (N/A) as a surgical intervention .  [name] history has been reviewed, patient examined, no change in status, stable for surgery.  I have reviewed the patient's chart and labs.  Questions were answered to the patient's satisfaction.     Carolyn Gaines

## 2017-01-25 NOTE — Anesthesia Preprocedure Evaluation (Addendum)
Anesthesia Evaluation  Patient identified by MRN, date of birth, ID band Patient awake    Airway Mallampati: I  TM Distance: >3 FB Neck ROM: Full    Dental  (+) Teeth Intact   Pulmonary former smoker,    Pulmonary exam normal breath sounds clear to auscultation       Cardiovascular Exercise Tolerance: Good METS: hypertension, Pt. on medications and Pt. on home beta blockers (-) angina+ CAD, + Past MI and + Cardiac Stents   Rhythm:Regular Rate:Normal  CABG x 3 hx MI, currently no CP, SOB, syncope, PND, edema etc.     Neuro/Psych PSYCHIATRIC DISORDERS Anxiety Depression    GI/Hepatic negative GI ROS, Neg liver ROS,   Endo/Other  negative endocrine ROS  Renal/GU negative Renal ROS     Musculoskeletal  (+) Arthritis , Fibromyalgia -  Abdominal Normal abdominal exam  (+)   Peds  Hematology  (+) anemia ,   Anesthesia Other Findings   Reproductive/Obstetrics                            Anesthesia Physical Anesthesia Plan  ASA: III  Anesthesia Plan: General   Post-op Pain Management:    Induction: Intravenous  PONV Risk Score and Plan: Ondansetron, Propofol and Midazolam  Airway Management Planned: Oral ETT  Additional Equipment:   Intra-op Plan:   Post-operative Plan: Extubation in OR  Informed Consent: I have reviewed the patients History and Physical, chart, labs and discussed the procedure including the risks, benefits and alternatives for the proposed anesthesia with the patient or authorized representative who has indicated his/her understanding and acceptance.     Plan Discussed with: CRNA  Anesthesia Plan Comments:         Anesthesia Quick Evaluation

## 2017-01-25 NOTE — Anesthesia Postprocedure Evaluation (Signed)
Anesthesia Post Note  Patient: Carolyn Gaines  Procedure(s) Performed: Procedure(s) (LRB): LAPAROSCOPIC CHOLECYSTECTOMY (N/A)  Patient location during evaluation: PACU Anesthesia Type: General Level of consciousness: awake and alert and oriented Pain management: pain level controlled Vital Signs Assessment: post-procedure vital signs reviewed and stable Respiratory status: spontaneous breathing Cardiovascular status: blood pressure returned to baseline Postop Assessment: no signs of nausea or vomiting Anesthetic complications: no Comments: late enrty     Last Vitals:  Vitals:   01/25/17 0915 01/25/17 0930  BP: 125/70 126/76  Pulse: 77   Resp: 12 16  Temp:  36.7 C    Last Pain:  Vitals:   01/25/17 0930  TempSrc: Oral  PainSc: 9                  Kahron Kauth

## 2017-01-28 ENCOUNTER — Encounter (HOSPITAL_COMMUNITY): Payer: Self-pay | Admitting: General Surgery

## 2017-02-05 ENCOUNTER — Encounter: Payer: Self-pay | Admitting: General Surgery

## 2017-02-05 ENCOUNTER — Ambulatory Visit (INDEPENDENT_AMBULATORY_CARE_PROVIDER_SITE_OTHER): Payer: Self-pay | Admitting: General Surgery

## 2017-02-05 VITALS — BP 128/73 | HR 80 | Temp 96.9°F | Resp 18 | Ht 64.0 in | Wt 160.0 lb

## 2017-02-05 DIAGNOSIS — Z09 Encounter for follow-up examination after completed treatment for conditions other than malignant neoplasm: Secondary | ICD-10-CM

## 2017-02-05 NOTE — Progress Notes (Signed)
Subjective:     Carolyn Gaines  Status post laparoscopic cholecystectomy. Did have some skin blistering from the adhesive tape. Some of her preoperative symptoms are still present. She is otherwise doing well. Objective:    BP 128/73   Pulse 80   Temp (!) 96.9 F (36.1 C)   Resp 18   Ht 5\' 4"  (1.626 m)   Wt 160 lb (72.6 kg)   BMI 27.46 kg/m   General:  alert, cooperative and no distress  Abdomen soft, incisions healing well. Staples removed, Steri-Strips applied. Final pathology report reviewed     Assessment:    Doing well postoperatively.    Plan:   Increase activity as able. May return to work on 02/25/2017. No restrictions needed. Follow appears needed.

## 2017-02-13 DIAGNOSIS — M797 Fibromyalgia: Secondary | ICD-10-CM | POA: Diagnosis not present

## 2017-02-13 DIAGNOSIS — I10 Essential (primary) hypertension: Secondary | ICD-10-CM | POA: Diagnosis not present

## 2017-02-13 DIAGNOSIS — K210 Gastro-esophageal reflux disease with esophagitis: Secondary | ICD-10-CM | POA: Diagnosis not present

## 2017-02-13 DIAGNOSIS — I2510 Atherosclerotic heart disease of native coronary artery without angina pectoris: Secondary | ICD-10-CM | POA: Diagnosis not present

## 2017-03-05 ENCOUNTER — Other Ambulatory Visit: Payer: Self-pay | Admitting: *Deleted

## 2017-03-05 MED ORDER — ROSUVASTATIN CALCIUM 40 MG PO TABS: 40.0000 mg | ORAL_TABLET | Freq: Every day | ORAL | 4 refills | Status: DC

## 2017-03-14 ENCOUNTER — Other Ambulatory Visit: Payer: Self-pay

## 2017-03-14 MED ORDER — NITROGLYCERIN 0.4 MG SL SUBL: SUBLINGUAL_TABLET | SUBLINGUAL | 3 refills | Status: DC

## 2017-03-19 ENCOUNTER — Ambulatory Visit (INDEPENDENT_AMBULATORY_CARE_PROVIDER_SITE_OTHER): Payer: BLUE CROSS/BLUE SHIELD | Admitting: Cardiology

## 2017-03-19 ENCOUNTER — Encounter: Payer: Self-pay | Admitting: Cardiology

## 2017-03-19 VITALS — BP 110/68 | HR 77 | Ht 64.0 in | Wt 161.0 lb

## 2017-03-19 DIAGNOSIS — R0789 Other chest pain: Secondary | ICD-10-CM

## 2017-03-19 DIAGNOSIS — I2510 Atherosclerotic heart disease of native coronary artery without angina pectoris: Secondary | ICD-10-CM

## 2017-03-19 MED ORDER — GABAPENTIN 300 MG PO CAPS: 300.0000 mg | ORAL_CAPSULE | Freq: Two times a day (BID) | ORAL | 3 refills | Status: DC

## 2017-03-19 MED ORDER — ROSUVASTATIN CALCIUM 40 MG PO TABS: 40.0000 mg | ORAL_TABLET | Freq: Every day | ORAL | 3 refills | Status: DC

## 2017-03-19 NOTE — Patient Instructions (Signed)
Medication Instructions:  START GABAPENTIN 300 MG - TWO TIMES DAILY   Labwork: NONE  Testing/Procedures: NONE  Follow-Up: Your physician wants you to follow-up in: 6 MONTHS .  You will receive a reminder letter in the mail two months in advance. If you don't receive a letter, please call our office to schedule the follow-up appointment.   Any Other Special Instructions Will Be Listed Below (If Applicable).     If you need a refill on your cardiac medications before your next appointment, please call your pharmacy.

## 2017-03-19 NOTE — Progress Notes (Signed)
Clinical Summary Carolyn Gaines is a 51 y.o.female seen today for follow up of the following medical problems.   1. 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 Residual LAD disease 60-70% managed medically. - From Dr Verl Dicker notes nuclear stress 03/2016 (after stent) with mild septal ischemia, intermediate risk. - 09/2016 cath with 90% ostial LM. S/p cabg 10/16/16 wth LIMA-LAD, SVG-OM, SVG-RCA - 09/2016 echo LVEF 55-60%, no WMAs - Has not been on ACE-I due to soft bp's  - denies any chest pain.  - compliant with meds  - she has ongoing chest wall pain since her surgery.       SH: husband recently passed away, had been ill for long time. She has started hiking, doing things on her "bucket list"  Works at pharmacy.  Past Medical History:  Diagnosis Date  . Anxiety   . Arthritis   . Depression   . Fibromyalgia   . MI (myocardial infarction) (HCC) 01/2016     Allergies  Allergen Reactions  . Bee Venom Other (See Comments)    Pus sites at injection  . Adhesive [Tape] Itching and Swelling    Please use paper tape  . Triple Antibiotic [Bacitracin-Neomycin-Polymyxin] Rash     Current Outpatient Prescriptions  Medication Sig Dispense Refill  . acetaminophen (TYLENOL) 500 MG tablet Take 1,000 mg by mouth every 6 (six) hours as needed for mild pain.    Marland Kitchen aspirin EC 81 MG EC tablet Take 1 tablet (81 mg total) by mouth daily. 30 tablet 11  . carvedilol (COREG) 3.125 MG tablet Take 1 tablet (3.125 mg total) by mouth 2 (two) times daily with a meal. 60 tablet 6  . escitalopram (LEXAPRO) 20 MG tablet Take 1 tablet (20 mg total) by mouth daily. 30 tablet 0  . nitroGLYCERIN (NITROSTAT) 0.4 MG SL tablet PLACE 1 T UNDER THE TONGUE Q 5 MINUTES FOR 3 DOSES AS NEEDED FOR CHEST PAIN 25 tablet 3  . oxyCODONE (OXY IR/ROXICODONE) 5 MG immediate release tablet Take 1 tablet (5 mg total) by mouth every 4 (four) hours as needed (for pain score of 1-4). 40 tablet 0  .  rosuvastatin (CRESTOR) 40 MG tablet Take 1 tablet (40 mg total) by mouth daily at 6 PM. 30 tablet 4  . ticagrelor (BRILINTA) 90 MG TABS tablet Take 1 tablet (90 mg total) by mouth 2 (two) times daily. (Patient not taking: Reported on 01/21/2017) 60 tablet 6   No current facility-administered medications for this visit.      Past Surgical History:  Procedure Laterality Date  . ABDOMINAL HYSTERECTOMY    . CARDIAC CATHETERIZATION N/A 02/19/2016   Procedure: Left Heart Cath and Coronary Angiography;  Surgeon: Yates Decamp, MD;  Location: [facility] INVASIVE CV LAB;  Service: Cardiovascular;  Laterality: N/A;  . CARDIAC CATHETERIZATION N/A 02/19/2016   Procedure: Coronary Stent Intervention;  Surgeon: Yates Decamp, MD;  Location: [facility] INVASIVE CV LAB;  Service: Cardiovascular;  Laterality: N/A;  . cesarian    . CHOLECYSTECTOMY N/A 01/25/2017   Procedure: LAPAROSCOPIC CHOLECYSTECTOMY;  Surgeon: Franky Macho, MD;  Location: [facility] ORS;  Service: General;  Laterality: N/A;  . CORONARY ARTERY BYPASS GRAFT N/A 10/26/2016   Procedure: CORONARY ARTERY BYPASS GRAFTING (CABG), ON PUMP, TIMES THREE, USING LEFT INTERNAL MAMMARY ARTERY AND ENDOSCOPICALLY HARVESTED RIGHT GREATER SAPHENOUS VEIN;  Surgeon: Alleen Borne, MD;  Location: [facility] OR;  Service: Open Heart Surgery;  Laterality: N/A;  LIMA-LAD SVG-RCA SVG-OM  . LEFT HEART CATH  AND CORONARY ANGIOGRAPHY N/A 10/22/2016   Procedure: Left Heart Cath and Coronary Angiography;  Surgeon: Lennette Bihari, MD;  Location: [facility] INVASIVE CV LAB;  Service: Cardiovascular;  Laterality: N/A;  . TEE WITHOUT CARDIOVERSION N/A 10/26/2016   Procedure: TRANSESOPHAGEAL ECHOCARDIOGRAM (TEE);  Surgeon: Alleen Borne, MD;  Location: [facility] OR;  Service: Open Heart Surgery;  Laterality: N/A;     Allergies  Allergen Reactions  . Bee Venom Other (See Comments)    Pus sites at injection  . Adhesive [Tape] Itching and Swelling    Please use paper tape  . Triple Antibiotic [Bacitracin-Neomycin-Polymyxin]  Rash      Family History  Problem Relation Age of Onset  . COPD Mother   . Arthritis Mother   . Heart disease Mother   . Psoriasis Father      Social History [name] reports that she quit smoking about 6 years ago. She has never used smokeless tobacco. [name] reports that she does not drink alcohol.   Review of Systems CONSTITUTIONAL: No weight loss, fever, chills, weakness or fatigue.  HEENT: Eyes: No visual loss, blurred vision, double vision or yellow sclerae.No hearing loss, sneezing, congestion, runny nose or sore throat.  SKIN: No rash or itching.  CARDIOVASCULAR: per hpi RESPIRATORY: No shortness of breath, cough or sputum.  GASTROINTESTINAL: No anorexia, nausea, vomiting or diarrhea. No abdominal pain or blood.  GENITOURINARY: No burning on urination, no polyuria NEUROLOGICAL: No headache, dizziness, syncope, paralysis, ataxia, numbness or tingling in the extremities. No change in bowel or bladder control.  MUSCULOSKELETAL: No muscle, back pain, joint pain or stiffness.  LYMPHATICS: No enlarged nodes. No history of splenectomy.  PSYCHIATRIC: No history of depression or anxiety.  ENDOCRINOLOGIC: No reports of sweating, cold or heat intolerance. No polyuria or polydipsia.  Marland Kitchen   Physical Examination Vitals:   03/19/17 0901  BP: 110/68  Pulse: 77  SpO2: 96%   Vitals:   03/19/17 0901  Weight: 161 lb (73 kg)  Height: 5\' 4"  (1.626 m)    Gen: resting comfortably, no acute distress HEENT: no scleral icterus, pupils equal round and reactive, no palptable cervical adenopathy,  CV: RRR, no m/r/g, no jvd Resp: Clear to auscultation bilaterally GI: abdomen is soft, non-tender, non-distended, normal bowel sounds, no hepatosplenomegaly MSK: extremities are warm, no edema.  Skin: warm, no rash Neuro:  no focal deficits Psych: appropriate affect    Assessment and Plan  1. CAD - no recent cardiac symptoms. Has some chest wall pain at surgical site consistent  with neuropathic pain. Start gabapentin 300mg  bid - unclear what happened to her statin, restart crestor.     F/u 6 months      [name], M.D.

## 2017-03-20 ENCOUNTER — Other Ambulatory Visit: Payer: Self-pay | Admitting: *Deleted

## 2017-03-20 MED ORDER — CARVEDILOL 3.125 MG PO TABS: 3.1250 mg | ORAL_TABLET | Freq: Two times a day (BID) | ORAL | 3 refills | Status: DC

## 2017-03-21 NOTE — Addendum Note (Signed)
Addendum  created 03/21/17 1154 by Deana Krock, MD   Sign clinical note    

## 2017-04-04 ENCOUNTER — Emergency Department (HOSPITAL_COMMUNITY)
Admission: EM | Admit: 2017-04-04 | Discharge: 2017-04-04 | Disposition: A | Discharge status: Home / Self Care | Payer: BLUE CROSS/BLUE SHIELD | Attending: Emergency Medicine | Admitting: Emergency Medicine

## 2017-04-04 ENCOUNTER — Emergency Department (HOSPITAL_COMMUNITY): Payer: BLUE CROSS/BLUE SHIELD

## 2017-04-04 ENCOUNTER — Encounter (HOSPITAL_COMMUNITY): Payer: Self-pay | Admitting: Emergency Medicine

## 2017-04-04 DIAGNOSIS — R109 Unspecified abdominal pain: Secondary | ICD-10-CM | POA: Diagnosis not present

## 2017-04-04 DIAGNOSIS — I2510 Atherosclerotic heart disease of native coronary artery without angina pectoris: Secondary | ICD-10-CM | POA: Diagnosis not present

## 2017-04-04 DIAGNOSIS — Z951 Presence of aortocoronary bypass graft: Secondary | ICD-10-CM | POA: Insufficient documentation

## 2017-04-04 DIAGNOSIS — R079 Chest pain, unspecified: Secondary | ICD-10-CM | POA: Diagnosis not present

## 2017-04-04 DIAGNOSIS — I252 Old myocardial infarction: Secondary | ICD-10-CM | POA: Diagnosis not present

## 2017-04-04 DIAGNOSIS — R0789 Other chest pain: Secondary | ICD-10-CM | POA: Diagnosis not present

## 2017-04-04 DIAGNOSIS — R0602 Shortness of breath: Secondary | ICD-10-CM | POA: Diagnosis not present

## 2017-04-04 DIAGNOSIS — Z87891 Personal history of nicotine dependence: Secondary | ICD-10-CM | POA: Diagnosis not present

## 2017-04-04 DIAGNOSIS — R531 Weakness: Secondary | ICD-10-CM | POA: Diagnosis not present

## 2017-04-04 DIAGNOSIS — Z7982 Long term (current) use of aspirin: Secondary | ICD-10-CM | POA: Insufficient documentation

## 2017-04-04 DIAGNOSIS — I10 Essential (primary) hypertension: Secondary | ICD-10-CM | POA: Insufficient documentation

## 2017-04-04 DIAGNOSIS — Z79899 Other long term (current) drug therapy: Secondary | ICD-10-CM | POA: Insufficient documentation

## 2017-04-04 LAB — BASIC METABOLIC PANEL
Anion gap: 5 (ref 5–15)
BUN: 10 mg/dL (ref 6–20)
CHLORIDE: 111 mmol/L (ref 101–111)
CO2: 24 mmol/L (ref 22–32)
CREATININE: 0.91 mg/dL (ref 0.44–1.00)
Calcium: 9 mg/dL (ref 8.9–10.3)
GFR calc non Af Amer: >60 mL/min (ref >60)
GLUCOSE: 99 mg/dL (ref 65–99)
Potassium: 4 mmol/L (ref 3.5–5.1)
Sodium: 140 mmol/L (ref 135–145)

## 2017-04-04 LAB — CBC
HCT: 35.2 % — ABNORMAL LOW (ref 36.0–46.0)
Hemoglobin: 10.9 g/dL — ABNORMAL LOW (ref 12.0–15.0)
MCH: 26.5 pg (ref 26.0–34.0)
MCHC: 31 g/dL (ref 30.0–36.0)
MCV: 85.6 fL (ref 78.0–100.0)
PLATELETS: 219 10*3/uL (ref 150–400)
RBC: 4.11 MIL/uL (ref 3.87–5.11)
RDW: 16.3 % — ABNORMAL HIGH (ref 11.5–15.5)
WBC: 3.5 10*3/uL — ABNORMAL LOW (ref 4.0–10.5)

## 2017-04-04 LAB — HEPATIC FUNCTION PANEL
ALK PHOS: 75 U/L (ref 38–126)
ALT: 10 U/L — ABNORMAL LOW (ref 14–54)
AST: 21 U/L (ref 15–41)
Albumin: 3.5 g/dL (ref 3.5–5.0)
Bilirubin, Direct: <0.1 mg/dL — ABNORMAL LOW (ref 0.1–0.5)
TOTAL PROTEIN: 7.5 g/dL (ref 6.5–8.1)
Total Bilirubin: 0.3 mg/dL (ref 0.3–1.2)

## 2017-04-04 LAB — I-STAT TROPONIN, ED
TROPONIN I, POC: 0 ng/mL (ref 0.00–0.08)
Troponin i, poc: 0 ng/mL (ref 0.00–0.08)

## 2017-04-04 LAB — LIPASE, BLOOD: Lipase: 36 U/L (ref 11–51)

## 2017-04-04 MED ORDER — PANTOPRAZOLE SODIUM 40 MG IV SOLR
40.0000 mg | Freq: Once | INTRAVENOUS | Status: AC
  Administered 2017-04-04: 40 mg via INTRAVENOUS
  Filled 2017-04-04: qty 40

## 2017-04-04 MED ORDER — PANTOPRAZOLE SODIUM 20 MG PO TBEC: 20.0000 mg | DELAYED_RELEASE_TABLET | Freq: Two times a day (BID) | ORAL | 0 refills | Status: DC

## 2017-04-04 MED ORDER — SODIUM CHLORIDE 0.9 % IV BOLUS (SEPSIS)
500.0000 mL | Freq: Once | INTRAVENOUS | Status: AC
  Administered 2017-04-04: 500 mL via INTRAVENOUS

## 2017-04-04 MED ORDER — FENTANYL CITRATE (PF) 100 MCG/2ML IJ SOLN
50.0000 ug | Freq: Once | INTRAMUSCULAR | Status: AC
Start: 2017-04-04 — End: 2017-04-04
  Administered 2017-04-04: 50 ug via INTRAVENOUS
  Filled 2017-04-04: qty 2

## 2017-04-04 MED ORDER — SUCRALFATE 1 G PO TABS: 1.0000 g | ORAL_TABLET | Freq: Three times a day (TID) | ORAL | 0 refills | Status: DC

## 2017-04-04 MED ORDER — GI COCKTAIL ~~LOC~~
30.0000 mL | Freq: Once | ORAL | Status: AC
  Administered 2017-04-04: 30 mL via ORAL
  Filled 2017-04-04: qty 30

## 2017-04-04 MED ORDER — ACETAMINOPHEN 500 MG PO TABS
1000.0000 mg | ORAL_TABLET | Freq: Once | ORAL | Status: AC
  Administered 2017-04-04: 1000 mg via ORAL
  Filled 2017-04-04: qty 2

## 2017-04-04 NOTE — Consult Note (Signed)
Cardiology Consultation:   Patient ID: Carolyn Gaines; [id number]; [birth year]   Admit date: 04/04/2017 Date of Consult: 04/04/2017  Primary Care Provider: Kari Baars, MD Primary Cardiologist: Dr. Wyline Mood    Patient Profile:   Carolyn Gaines is a 51 y.o. female with a hx of CAD, depression, anxiety and fibromyalgia  who is being seen today for the evaluation of chest pain at the request of Dr. Verdie Mosher.   History of acute inferior STEMI on 02/19/2016 for which underwent emergent by Dr. Jacinto Halim PCI/DES RCA. She has residual 60-70% mid LAD stenosis --> treated medically.   She was admitted 09/2016 with unstable angina. Cath with 90% ostial LM s/p CABG with LIMA-LAD, SVG-OM, SVG-RCA.   Patient was doing on cardiac stand point when last seen by Dr.Branch 03/19/17 except chest wall pain at surgical site consistent with neuropathic pain. Stared gabapentin  bid.   History of Present Illness:   [name] was in [facility] up until yesterday when noted intermittent sharp chest pain. This morning patient did un loading of heavy box at work. Then noted sob and substernal sharp/burning pain with diaphoresis. No nausea or radiation. Did not improved after SL nitro. EMS gave additional nitro without improvement. Patient received GI cockatil and Fentanyl in ER with improved symptoms from 8/10 to 5/10. However during my evaluation her pain intensified to 7/10. Somewhat similar to prior angina. She does hiking without any chest discomfort or dyspnea. Did not required any SL nitro since her CABG until today.   EKG:  The EKG was personally reviewed and demonstrates:  NSR without acute ischemic changes. POC troponin negative. Electrolytes and kidney function normal.  CXR without acute disease.   The patient denies nausea, vomiting, fever, palpitations, orthopnea, PND, dizziness, syncope, cough, congestion, abdominal pain, hematochezia, melena, lower extremity edema. Compliant with medications.    Past Medical  History:  Diagnosis Date  . Anxiety   . Arthritis   . Depression   . Fibromyalgia   . MI (myocardial infarction) (HCC) 01/2016    Past Surgical History:  Procedure Laterality Date  . ABDOMINAL HYSTERECTOMY    . CARDIAC CATHETERIZATION N/A 02/19/2016   Procedure: Left Heart Cath and Coronary Angiography;  Surgeon: Yates Decamp, MD;  Location: [facility] INVASIVE CV LAB;  Service: Cardiovascular;  Laterality: N/A;  . CARDIAC CATHETERIZATION N/A 02/19/2016   Procedure: Coronary Stent Intervention;  Surgeon: Yates Decamp, MD;  Location: [facility] INVASIVE CV LAB;  Service: Cardiovascular;  Laterality: N/A;  . cesarian    . CHOLECYSTECTOMY N/A 01/25/2017   Procedure: LAPAROSCOPIC CHOLECYSTECTOMY;  Surgeon: Franky Macho, MD;  Location: [facility] ORS;  Service: General;  Laterality: N/A;  . CORONARY ARTERY BYPASS GRAFT N/A 10/26/2016   Procedure: CORONARY ARTERY BYPASS GRAFTING (CABG), ON PUMP, TIMES THREE, USING LEFT INTERNAL MAMMARY ARTERY AND ENDOSCOPICALLY HARVESTED RIGHT GREATER SAPHENOUS VEIN;  Surgeon: Alleen Borne, MD;  Location: [facility] OR;  Service: Open Heart Surgery;  Laterality: N/A;  LIMA-LAD SVG-RCA SVG-OM  . LEFT HEART CATH AND CORONARY ANGIOGRAPHY N/A 10/22/2016   Procedure: Left Heart Cath and Coronary Angiography;  Surgeon: Lennette Bihari, MD;  Location: [facility] INVASIVE CV LAB;  Service: Cardiovascular;  Laterality: N/A;  . TEE WITHOUT CARDIOVERSION N/A 10/26/2016   Procedure: TRANSESOPHAGEAL ECHOCARDIOGRAM (TEE);  Surgeon: Alleen Borne, MD;  Location: [facility] OR;  Service: Open Heart Surgery;  Laterality: N/A;     Home Medications:  Prior to Admission medications   Medication Sig Start Date End Date Taking? Authorizing Provider  acetaminophen (  TYLENOL) 500 MG tablet Take 1,000 mg by mouth every 6 (six) hours as needed for mild pain.   Yes [provider]  aspirin EC 81 MG EC tablet Take 1 tablet (81 mg total) by mouth daily. 02/20/16  Yes Marcy Salvo, NP  carvedilol (COREG) 3.125 MG tablet Take 1  tablet (3.125 mg total) by mouth 2 (two) times daily with a meal. 03/20/17  Yes Branch, Dorothe Pea, MD  escitalopram (LEXAPRO) 20 MG tablet Take 1 tablet (20 mg total) by mouth daily. 11/26/16  Yes Jodelle Gross, NP  gabapentin (NEURONTIN) 300 MG capsule Take 1 capsule (300 mg total) by mouth 2 (two) times daily. Patient taking differently: Take 300 mg by mouth as needed.  03/19/17  Yes Branch, Dorothe Pea, MD  nitroGLYCERIN (NITROSTAT) 0.4 MG SL tablet PLACE 1 T UNDER THE TONGUE Q 5 MINUTES FOR 3 DOSES AS NEEDED FOR CHEST PAIN 03/14/17  Yes Branch, Dorothe Pea, MD  pantoprazole (PROTONIX) 40 MG tablet Take 40 mg by mouth at bedtime. 03/09/17  Yes [provider]  rosuvastatin (CRESTOR) 40 MG tablet Take 1 tablet (40 mg total) by mouth daily. Patient taking differently: Take 40 mg by mouth at bedtime.  03/19/17 06/17/17 Yes BranchDorothe Pea, MD  traMADol (ULTRAM) 50 MG tablet TK 50 MG  PO QID PRN 03/13/17  Yes [provider]  oxyCODONE (OXY IR/ROXICODONE) 5 MG immediate release tablet Take 1 tablet (5 mg total) by mouth every 4 (four) hours as needed (for pain score of 1-4). Patient not taking: Reported on 04/04/2017 01/25/17   Franky Macho, MD    Inpatient Medications: Scheduled Meds:  Continuous Infusions:  PRN Meds:   Allergies:    Allergies  Allergen Reactions  . Bee Venom Other (See Comments)    Pus sites at injection  . Adhesive [Tape] Itching and Swelling    Please use paper tape  . Triple Antibiotic [Bacitracin-Neomycin-Polymyxin] Rash    Social History:   Social History   Social History  . Marital status: Widowed    Spouse name: N/A  . Number of children: N/A  . Years of education: N/A   Occupational History  . Not on file.   Social History Main Topics  . Smoking status: Former Smoker    Quit date: 03/21/2011  . Smokeless tobacco: Never Used  . Alcohol use No  . Drug use: No  . Sexual activity: Not on file   Other Topics Concern  . Not on  file   Social History Narrative  . No narrative on file    Family History:    Family History  Problem Relation Age of Onset  . COPD Mother   . Arthritis Mother   . Heart disease Mother   . Psoriasis Father      ROS:  Please see the history of present illness.  ROS All other ROS reviewed and negative.     Physical Exam/Data:   Vitals:   04/04/17 1200 04/04/17 1215 04/04/17 1230 04/04/17 1245  BP: 105/63 112/72 112/75 106/78  Pulse: 66 65 64 60  Resp: 16 19 19 20   Temp:      TempSrc:      SpO2: 99% 99% 97% 96%  Weight:      Height:        Intake/Output Summary (Last 24 hours) at 04/04/17 1306 Last data filed at 04/04/17 1250  Gross per 24 hour  Intake  500 ml  Output                0 ml  Net              500 ml   Filed Weights   04/04/17 1026  Weight: 160 lb (72.6 kg)   Body mass index is 27.46 kg/m.  General:  Well nourished, well developed, in no acute distress HEENT: normal Lymph: no adenopathy Neck: no JVD Endocrine:  No thryomegaly Vascular: No carotid bruits; FA pulses 2+ bilaterally without bruits  Cardiac:  normal S1, S2; RRR; no murmur. TTP at substernal area Lungs:  clear to auscultation bilaterally, no wheezing, rhonchi or rales  Abd: soft, nontender, no hepatomegaly  Ext: no edema Musculoskeletal:  No deformities, BUE and BLE strength normal and equal Skin: warm and dry  Neuro:  CNs 2-12 intact, no focal abnormalities noted Psych:  Normal affect     Relevant CV Studies: TEE 10/26/16  Left ventricle: The left ventricular cavity was of normal size. There was mild global left ventricular hypokinesis with the estimated ejection fraction at 50--55%. There were no regional wall motion abnormalities. The post-bypass LV systolic function appeared unchanged from the pre-bypass study. The ejection fraction was estimated at 50-55% No intra-cardiac air was seen in the post-bypass views.  Mitral valve: The anterior mitral leaflet was mildly  thickened. Both leaflet opened normally without restriction. The leaflets coapted normally without prolapsing or flail segments. There was trace mitral insufficiency. There was trace mitral insufficiency in the post-bypass exam which was unchanged from the pre-bypass study.  Right ventricle: The right ventricular cavity was of normal size. There was normal appearing RV systolic function. The RV function appeared normal in the post-bypass exam. The RV was normal and there was normal contractility of the RV free wall.  Tricuspid valve: The tricuspid valve leaflets appeared structurally normal. There was trace tricuspid insufficiency.  Pulmonic valve: The pulmonic valve appeared normal with trace pulmonic insufficiency.  Septum: No Patent Foramen Ovale present.  Left atrium: Patent foramen ovale not present.  Left atrium: No spontaneous echo contrast.   Left Heart Cath and Coronary Angiography  Conclusion     Mid LAD lesion, 50 %stenosed.  Ost LM lesion, 85 %stenosed.  Mid RCA-1 lesion, 50 %stenosed.  Mid RCA-2 lesion, 0 %stenosed.  A drug eluting .  Prox RCA lesion, 20 %stenosed.   Severe native CAD with 80-90% ostial stenosis of the left main coronary artery which did not improve following IC nitroglycerin administration; 50% mid LAD stenoses; normal left circumflex vessel; and patent mid RCA stent with 45-50% smooth eccentric stenosis proximal to the stented segment and 20% proximal irregularity.  Mild  residual LV dysfunction with an ejection fraction of 45-50% with mild mid inferior hypocontractility.  RECOMMENDATION: The patient has been on full dose Brilinta since her DES stent implantation in July 2018.  Surgical consultation was obtained with Dr. Tyrone Sage.  Brilinta will be discontinued.  The patient will be stabilized on IV NTG with plans to reinstitute heparin. Plan for CABG surgery later this week following several days of Brilinta washout.   If the patient becomes  unstable, consider emergent left main stenting.    Laboratory Data:  Chemistry Recent Labs Lab 04/04/17 1034  NA 140  K 4.0  CL 111  CO2 24  GLUCOSE 99  BUN 10  CREATININE 0.91  CALCIUM 9.0  GFRNONAA >60  GFRAA >60  ANIONGAP 5    No results for input(s): PROT,  ALBUMIN, AST, ALT, ALKPHOS, BILITOT in the last 168 hours. Hematology Recent Labs Lab 04/04/17 1034  WBC 3.5*  RBC 4.11  HGB 10.9*  HCT 35.2*  MCV 85.6  MCH 26.5  MCHC 31.0  RDW 16.3*  PLT 219   Cardiac EnzymesNo results for input(s): TROPONINI in the last 168 hours.  Recent Labs Lab 04/04/17 1039  TROPIPOC 0.00    BNPNo results for input(s): BNP, PROBNP in the last 168 hours.  DDimer No results for input(s): DDIMER in the last 168 hours.  Radiology/Studies:  Dg Chest 2 View  Result Date: 04/04/2017 CLINICAL DATA:  Sharp chest pain today after lifting a 50 pound box. CABG six months ago. EXAM: CHEST  2 VIEW COMPARISON:  None. FINDINGS: The heart size is normal. Areas of linear atelectasis or scarring are again seen in the lung bases bilaterally. The upper lung fields are clear. There is no edema or effusion. No other focal airspace consolidation is present. Seven mediastinal wires are intact. The visualized soft tissues and bony thorax are unremarkable. IMPRESSION: No active cardiopulmonary disease. Electronically Signed   By: Marin Roberts M.D.   On: 04/04/2017 11:09    Assessment and Plan:   1. Chest pain - With typical and atypical features. Yesterday had intermittent sharp pain. This episode started after lifting heavy box at work. Did not improved with SL nitro. Her symptoms eased off with GI cocktail and pain meds however reoccurred. EKG without acute changes. POC troponin negative. Patient has hx of prior GI bleed. Patient insist admission with Gi work up. IM to admit. Cycle troponin. No need for stress testing. Trial of IV protonix.   2. CAD s/p DES to RCA 01/2016 & CABG 09/2016 (LIMA-LAD,  SVG-OM, SVG-RCA) - Continue ASA, statin and BB.  3. HLD - 10/25/2016: Cholesterol 119; HDL 45; LDL Cholesterol 39; Triglycerides 177; VLDL 35  - Continue statin    Signed, Bhagat,Bhavinkumar, PA  04/04/2017 1:06 PM   Patient examined chart reviewed Exam remarkable for stable hemodynamics small keloid formation over sternotomy No murmur and clear lungs. Her pain is very atypical She is convinced she has an ulcer and had One when she was 17 More pain relief with GI cocktail than anything else. Her CABG is only 5 months old enzymes negative and ECG normal doubt premature graft failure. Would Rx with protonix and get GI consult for possible endoscopy Hct stable and no melena. She does not need further cardiac w/u here in hospital  [facility]

## 2017-04-04 NOTE — Discharge Instructions (Signed)
Your heart work-up is reassuring. You are started on treatment for peptic ulcer disease. Please arrange follow-up with gastroenterology.  Please return without fail for worsening symptoms, including fever, worsening pain, intractable vomiting, bloody or black stools or any other symptoms concerning to you.

## 2017-04-04 NOTE — ED Notes (Signed)
Patient given something to drink per EDP approval  

## 2017-04-04 NOTE — ED Triage Notes (Signed)
PT arrives via [facility] from work for sudden onset central chest burning pain. PT took a nitro with no relief. EMS gave an additional nitro. PT was also given 324 ASA in route. PT reports SOB with chest pain. PT denies N/V.  Triple bypass 6 months ago

## 2017-04-04 NOTE — ED Provider Notes (Addendum)
[facility]-EMERGENCY DEPT Provider Note   CSN: [id number] Arrival date & time: 04/04/17  1015     History   Chief Complaint Chief Complaint  Patient presents with  . Chest Pain    HPI Carolyn Gaines is a 51 y.o. female.  The history is provided by the patient.  Chest Pain   This is a new problem. The current episode started 1 to 2 hours ago. The problem occurs rarely. The problem has not changed since onset.The pain is associated with exertion, movement and lifting. The pain is present in the substernal region. The pain is moderate. The quality of the pain is described as burning and sharp. The pain does not radiate. The symptoms are aggravated by certain positions. Associated symptoms include abdominal pain, diaphoresis, shortness of breath and weakness. Pertinent negatives include no back pain, no fever, no leg pain, no lower extremity edema, no nausea and no vomiting. She has tried nitroglycerin for the symptoms. The treatment provided no relief.   51 year old female who presents with chest pain. She has a history of coronary artery disease status post 3v CABG and stenting to RCA. Reports being in her initial state of health. Reports that she was lifting boxes and cages at work today when she experienced sudden onset of burning pain in the center of her chest that was nonradiating. Also states that there was a sharp component that was worse with movement and palpation. She reports that is associated with diaphoresis and shortness of breath with generalized weakness, but denies nausea, vomiting, diarrhea, abdominal pain or back pain. She states this is similar to her cardiac pain. She took 1 dose of nitroglycerin, without relief. EMS was subsequently called. She was given 4 baby aspirins and additional nitroglycerin in route, without significant improvement.  Past Medical History:  Diagnosis Date  . Anxiety   . Arthritis   . Depression   . Fibromyalgia   . MI (myocardial infarction) (HCC)  01/2016    Patient Active Problem List   Diagnosis Date Noted  . Calculus of gallbladder without cholecystitis without obstruction   . 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 Unstable angina (HCC)   . Fibromyalgia 10/20/2016  . Essential hypertension 07/18/2016  . Psoriasis 07/18/2016  . Precordial chest pain 07/03/2016  . Depression with anxiety 07/03/2016  . Normocytic anemia 07/03/2016  . History of acute inferior wall MI 02/19/2016  . Coronary artery disease involving native heart without angina pectoris 02/19/2016    Past Surgical History:  Procedure Laterality Date  . ABDOMINAL HYSTERECTOMY    . CARDIAC CATHETERIZATION N/A 02/19/2016   Procedure: Left Heart Cath and Coronary Angiography;  Surgeon: Yates Decamp, MD;  Location: [facility] INVASIVE CV LAB;  Service: Cardiovascular;  Laterality: N/A;  . CARDIAC CATHETERIZATION N/A 02/19/2016   Procedure: Coronary Stent Intervention;  Surgeon: Yates Decamp, MD;  Location: [facility] INVASIVE CV LAB;  Service: Cardiovascular;  Laterality: N/A;  . cesarian    . CHOLECYSTECTOMY N/A 01/25/2017   Procedure: LAPAROSCOPIC CHOLECYSTECTOMY;  Surgeon: Franky Macho, MD;  Location: [facility] ORS;  Service: General;  Laterality: N/A;  . CORONARY ARTERY BYPASS GRAFT N/A 10/26/2016   Procedure: CORONARY ARTERY BYPASS GRAFTING (CABG), ON PUMP, TIMES THREE, USING LEFT INTERNAL MAMMARY ARTERY AND ENDOSCOPICALLY HARVESTED RIGHT GREATER SAPHENOUS VEIN;  Surgeon: Alleen Borne, MD;  Location: [facility] OR;  Service: Open Heart Surgery;  Laterality: N/A;  LIMA-LAD SVG-RCA SVG-OM  . LEFT HEART CATH AND CORONARY ANGIOGRAPHY N/A 10/22/2016   Procedure: Left Heart Cath and  Coronary Angiography;  Surgeon: Lennette Bihari, MD;  Location: [facility] INVASIVE CV LAB;  Service: Cardiovascular;  Laterality: N/A;  . TEE WITHOUT CARDIOVERSION N/A 10/26/2016   Procedure: TRANSESOPHAGEAL ECHOCARDIOGRAM (TEE);  Surgeon: Alleen Borne, MD;  Location: [facility] OR;  Service: Open Heart Surgery;  Laterality: N/A;    OB History    No  data available       Home Medications    Prior to Admission medications   Medication Sig Start Date End Date Taking? Authorizing Provider  acetaminophen (TYLENOL) 500 MG tablet Take 1,000 mg by mouth every 6 (six) hours as needed for mild pain.   Yes [provider]  aspirin EC 81 MG EC tablet Take 1 tablet (81 mg total) by mouth daily. 02/20/16  Yes Marcy Salvo, NP  carvedilol (COREG) 3.125 MG tablet Take 1 tablet (3.125 mg total) by mouth 2 (two) times daily with a meal. 03/20/17  Yes Branch, Dorothe Pea, MD  escitalopram (LEXAPRO) 20 MG tablet Take 1 tablet (20 mg total) by mouth daily. 11/26/16  Yes Jodelle Gross, NP  gabapentin (NEURONTIN) 300 MG capsule Take 1 capsule (300 mg total) by mouth 2 (two) times daily. Patient taking differently: Take 300 mg by mouth as needed.  03/19/17  Yes Branch, Dorothe Pea, MD  nitroGLYCERIN (NITROSTAT) 0.4 MG SL tablet PLACE 1 T UNDER THE TONGUE Q 5 MINUTES FOR 3 DOSES AS NEEDED FOR CHEST PAIN 03/14/17  Yes Branch, Dorothe Pea, MD  pantoprazole (PROTONIX) 40 MG tablet Take 40 mg by mouth at bedtime. 03/09/17  Yes [provider]  rosuvastatin (CRESTOR) 40 MG tablet Take 1 tablet (40 mg total) by mouth daily. Patient taking differently: Take 40 mg by mouth at bedtime.  03/19/17 06/17/17 Yes BranchDorothe Pea, MD  traMADol (ULTRAM) 50 MG tablet TK 50 MG  PO QID PRN 03/13/17  Yes [provider]  oxyCODONE (OXY IR/ROXICODONE) 5 MG immediate release tablet Take 1 tablet (5 mg total) by mouth every 4 (four) hours as needed (for pain score of 1-4). Patient not taking: Reported on 04/04/2017 01/25/17   Franky Macho, MD  pantoprazole (PROTONIX) 20 MG tablet Take 1 tablet (20 mg total) by mouth 2 (two) times daily before a meal. 04/04/17   Lavera Guise, MD  sucralfate (CARAFATE) 1 g tablet Take 1 tablet (1 g total) by mouth 4 (four) times daily -  with meals and at bedtime. 04/04/17   Lavera Guise, MD    Family History Family History   Problem Relation Age of Onset  . COPD Mother   . Arthritis Mother   . Heart disease Mother   . Psoriasis Father     Social History Social History  Substance Use Topics  . Smoking status: Former Smoker    Quit date: 03/21/2011  . Smokeless tobacco: Never Used  . Alcohol use No     Allergies   Bee venom; Adhesive [tape]; and Triple antibiotic [bacitracin-neomycin-polymyxin]   Review of Systems Review of Systems  Constitutional: Positive for diaphoresis. Negative for fever.  Respiratory: Positive for shortness of breath.   Cardiovascular: Positive for chest pain.  Gastrointestinal: Positive for abdominal pain. Negative for nausea and vomiting.  Musculoskeletal: Negative for back pain.  Neurological: Positive for weakness.  All other systems reviewed and are negative.    Physical Exam Updated Vital Signs BP 110/64   Pulse 64   Temp 98.5 F (36.9 C) (Oral)   Resp 20   Ht  (  1.626 m)   Wt 72.6 kg (160 lb)   SpO2 100%   BMI 27.46 kg/m   Physical Exam Physical Exam  Nursing note and vitals reviewed. Constitutional:  non-toxic, and in no acute distress Head: Normocephalic and atraumatic.  Mouth/Throat: Oropharynx is clear and moist.  Neck: Normal range of motion. Neck supple.  Cardiovascular: Normal rate and regular rhythm.   Pulmonary/Chest: Effort normal and breath souns normal. Midline vertical  thoracic wound, well healing. Mild tenderness to palpation around wound.  Abdominal: Soft. There is no tenderness. There is no rebound and no guarding.  Musculoskeletal: Normal range of motion.  Neurological: Alert, no facial droop, fluent speech, moves all extremities symmetrically Skin: Skin is warm and dry.  Psychiatric: Cooperative   ED Treatments / Results  Labs (all labs ordered are listed, but only abnormal results are displayed) Labs Reviewed  CBC - Abnormal; Notable for the following:       Result Value   WBC 3.5 (*)    Hemoglobin 10.9 (*)    HCT  35.2 (*)    RDW 16.3 (*)    All other components within normal limits  HEPATIC FUNCTION PANEL - Abnormal; Notable for the following:    ALT 10 (*)    Bilirubin, Direct <0.1 (*)    All other components within normal limits  BASIC METABOLIC PANEL  LIPASE, BLOOD  I-STAT TROPONIN, ED  I-STAT TROPONIN, ED    EKG  EKG Interpretation  Date/Time:  Thursday April 04 2017 15:26:27 EDT Ventricular Rate:  55 PR Interval:    QRS Duration: 83 QT Interval:  437 QTC Calculation: 418 R Axis:   64 Text Interpretation:  Sinus rhythm Probable left atrial enlargement Low voltage, precordial leads no acute changes  Confirmed by Crista Curb [phone]) on 04/04/2017 3:34:14 PM       Radiology Dg Chest 2 View  Result Date: 04/04/2017 CLINICAL DATA:  Sharp chest pain today after lifting a 50 pound box. CABG six months ago. EXAM: CHEST  2 VIEW COMPARISON:  None. FINDINGS: The heart size is normal. Areas of linear atelectasis or scarring are again seen in the lung bases bilaterally. The upper lung fields are clear. There is no edema or effusion. No other focal airspace consolidation is present. Seven mediastinal wires are intact. The visualized soft tissues and bony thorax are unremarkable. IMPRESSION: No active cardiopulmonary disease. Electronically Signed   By: Marin Roberts M.D.   On: 04/04/2017 11:09    Procedures Procedures (including critical care time)  Medications Ordered in ED Medications  sodium chloride 0.9 % bolus 500 mL (0 mLs Intravenous Stopped 04/04/17 1250)  fentaNYL (SUBLIMAZE) injection 50 mcg (50 mcg Intravenous Given 04/04/17 1136)  gi cocktail (Maalox,Lidocaine,Donnatal) (30 mLs Oral Given 04/04/17 1209)  pantoprazole (PROTONIX) injection 40 mg (40 mg Intravenous Given 04/04/17 1413)  acetaminophen (TYLENOL) tablet 1,000 mg (1,000 mg Oral Given 04/04/17 1506)     Initial Impression / Assessment and Plan / ED Course  I have reviewed the triage vital signs and the nursing  notes.  Pertinent labs & imaging results that were available during my care of the patient were reviewed by me and considered in my medical decision making (see chart for details).     51 year old female with history of CAD status post CABG and prior stenting who presents with chest pain. She has both typical and atypical features. Her initial troponin is negative and EKG is nonischemic. Symptoms not fully resolved with nitroglycerin. Given additional fentanyl and  a GI cocktail, with mild improvement. CXR visualized and shows no acute cardiopulmonary processes.   Cardiology did consult. Dr. Eden Emms does not feel this is cardiac. Recommended Gi work-up given prior history of PUD. Does not think patient requires admission for cardiac work-up.   No evidence of GI bleeding or other inpatient criteria for GI w/u. Will start carafate protonix and refer to GI for outpatient work-up. Repeat troponin and EKG unremarkable w/o dynamic changes. Strict return and follow-up instructions reviewed. She expressed understanding of all discharge instructions and felt comfortable with the plan of care.   Final Clinical Impressions(s) / ED Diagnoses   Final diagnoses:  Nonspecific chest pain  Atypical chest pain    New Prescriptions New Prescriptions   PANTOPRAZOLE (PROTONIX) 20 MG TABLET    Take 1 tablet (20 mg total) by mouth 2 (two) times daily before a meal.   SUCRALFATE (CARAFATE) 1 G TABLET    Take 1 tablet (1 g total) by mouth 4 (four) times daily -  with meals and at bedtime.     Lavera Guise, MD 04/04/17 1444    Lavera Guise, MD 04/04/17 [phone]

## 2017-04-04 NOTE — ED Notes (Signed)
Patient transported to X-ray 

## 2017-04-05 ENCOUNTER — Encounter: Payer: Self-pay | Admitting: Internal Medicine

## 2017-05-02 IMAGING — DX DG CHEST 2V
2 series · 2 of 2 positions shown · non-contrast
Comparison: 11/11/2016

CLINICAL DATA: HX CABG 10/26/16, patient complains of burning a the
incision site along with sever upper abdominal pain, SOB, hx MI
(patient going to [REDACTED] now)

EXAM:
CHEST  2 VIEW

[dg chest 2 view (1 of 2)]
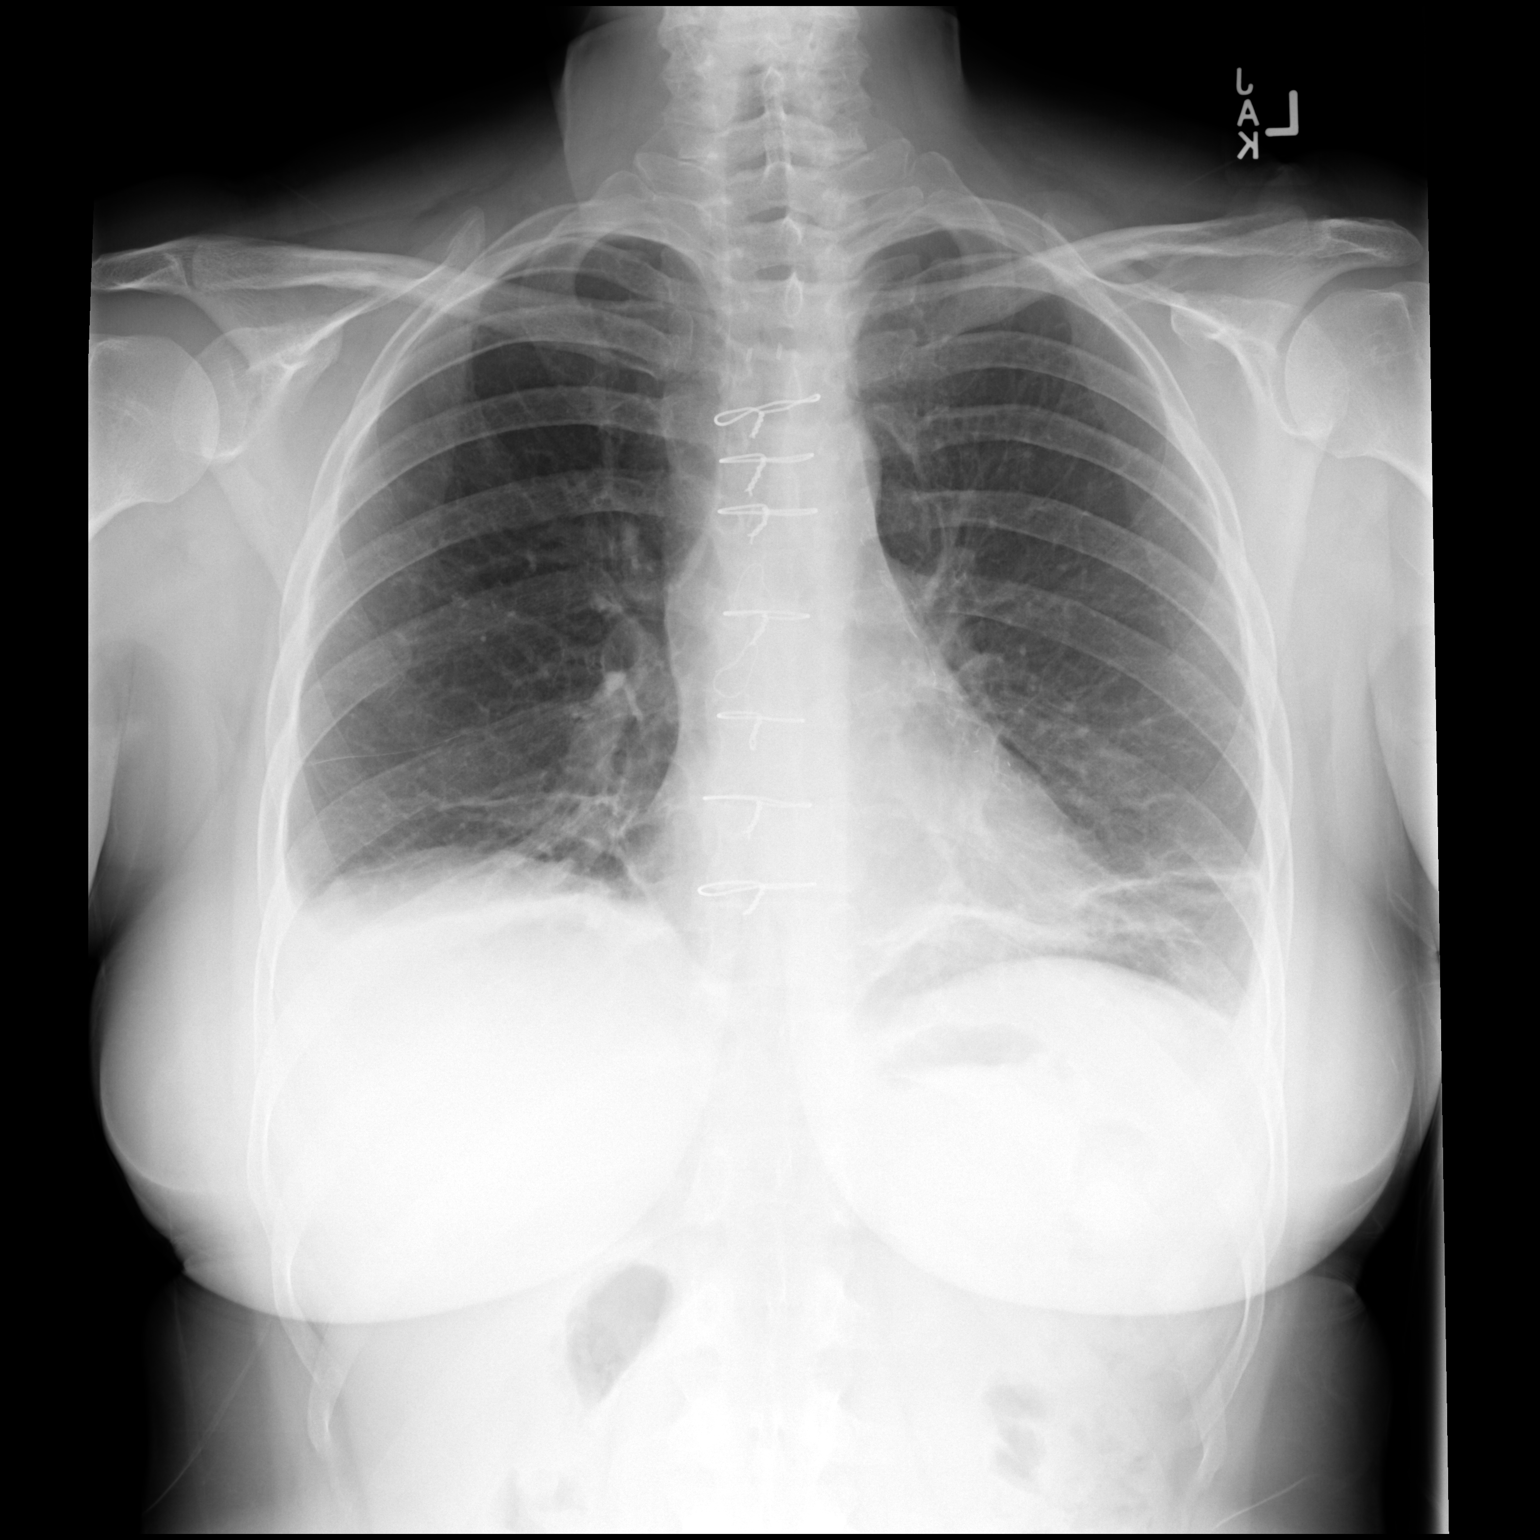

[dg chest 2 view (2 of 2)]
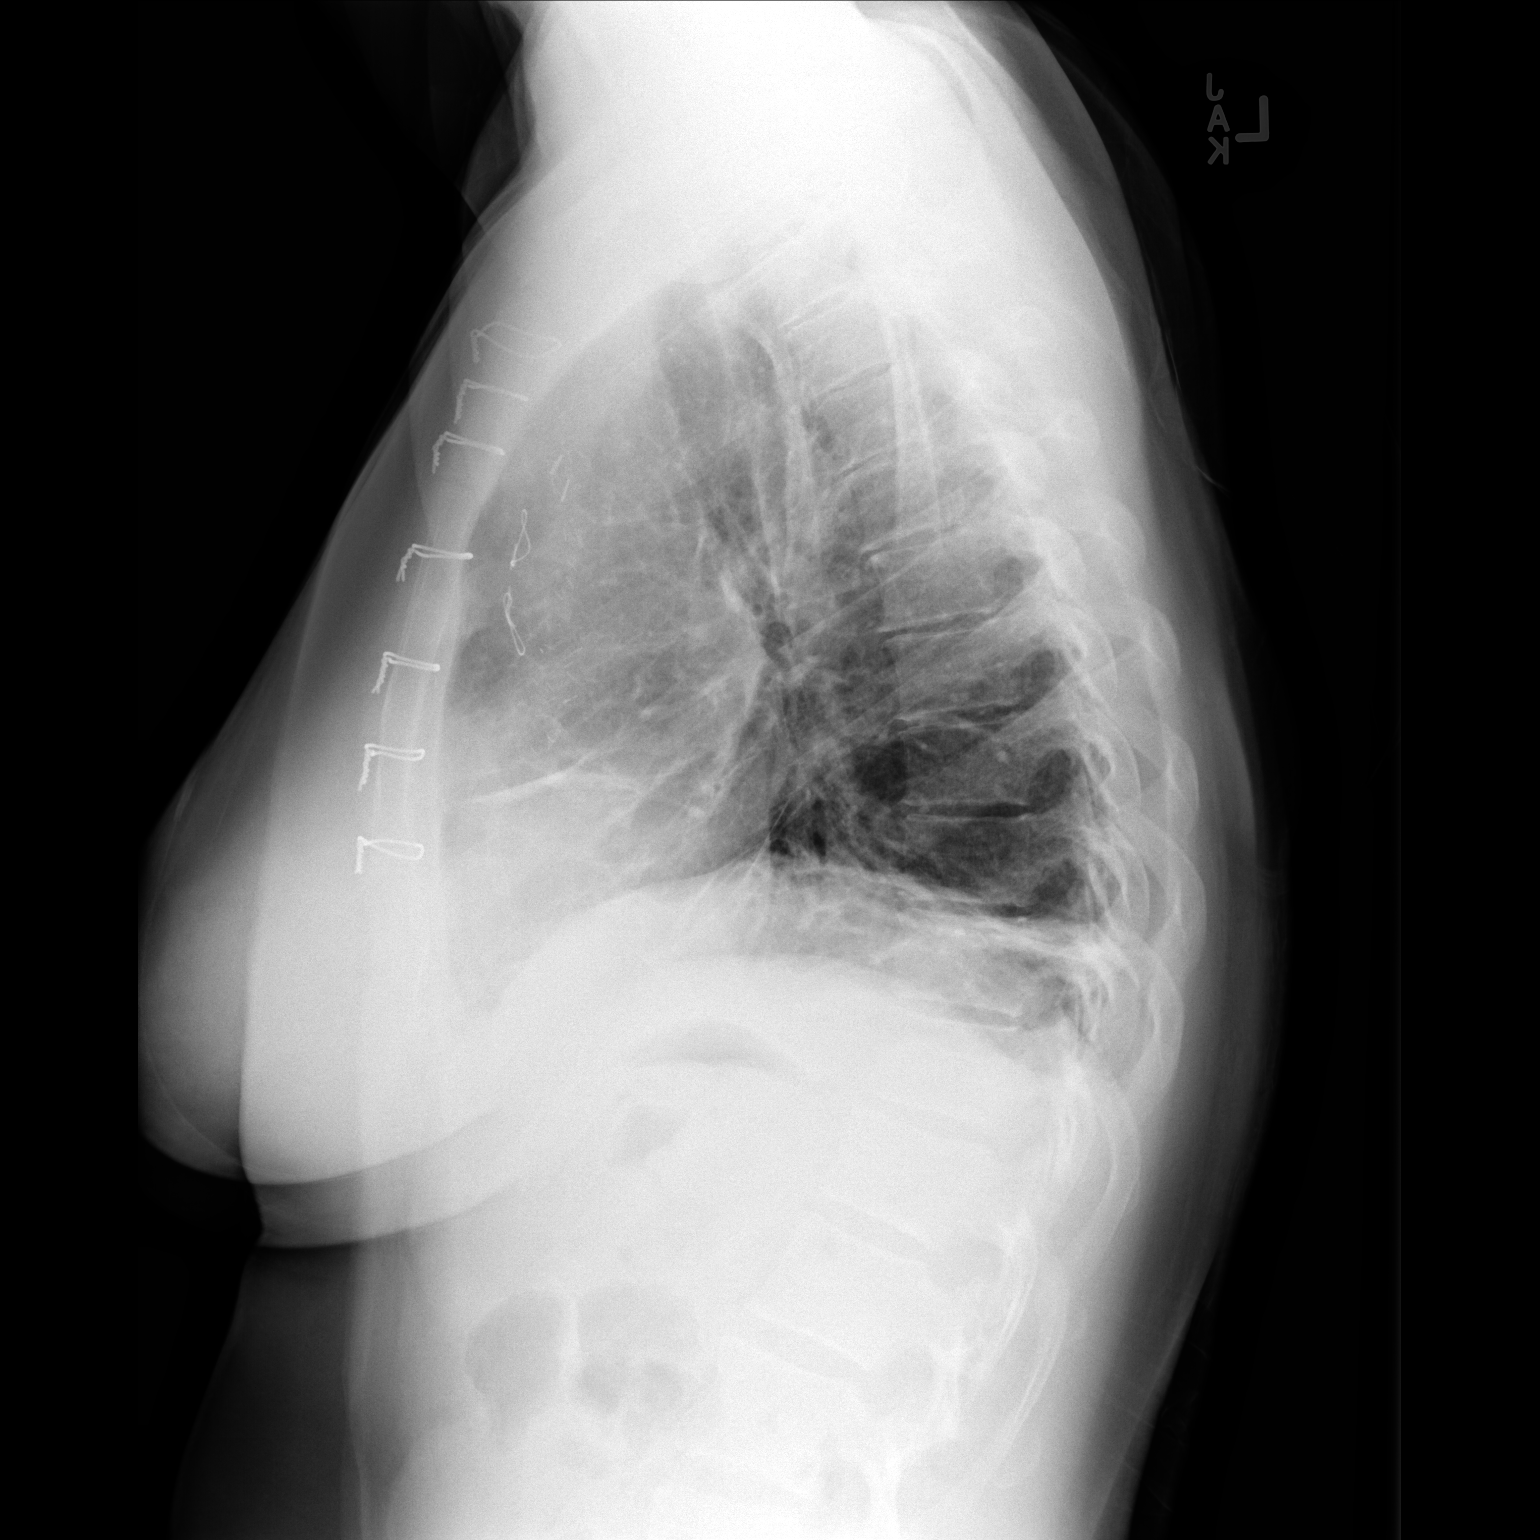

[2 of 2 positions shown; findings below may reference images not displayed]

FINDINGS: Status post median sternotomy and CABG. The heart size is normal.
There is subsegmental atelectasis at both lung bases, increased on
the left since prior study and stable on the right. There are small
bilateral pleural effusions. No pneumothorax. No consolidations.
IMPRESSION: Bibasilar atelectasis and small effusions.

## 2017-05-06 ENCOUNTER — Ambulatory Visit (INDEPENDENT_AMBULATORY_CARE_PROVIDER_SITE_OTHER): Payer: BLUE CROSS/BLUE SHIELD | Admitting: Internal Medicine

## 2017-05-06 ENCOUNTER — Encounter: Payer: Self-pay | Admitting: Internal Medicine

## 2017-05-06 ENCOUNTER — Other Ambulatory Visit (INDEPENDENT_AMBULATORY_CARE_PROVIDER_SITE_OTHER): Payer: BLUE CROSS/BLUE SHIELD

## 2017-05-06 ENCOUNTER — Encounter (INDEPENDENT_AMBULATORY_CARE_PROVIDER_SITE_OTHER): Payer: Self-pay

## 2017-05-06 VITALS — BP 106/68 | HR 84 | Ht 63.0 in | Wt 161.0 lb

## 2017-05-06 DIAGNOSIS — K5900 Constipation, unspecified: Secondary | ICD-10-CM

## 2017-05-06 DIAGNOSIS — R140 Abdominal distension (gaseous): Secondary | ICD-10-CM

## 2017-05-06 DIAGNOSIS — K219 Gastro-esophageal reflux disease without esophagitis: Secondary | ICD-10-CM

## 2017-05-06 DIAGNOSIS — R079 Chest pain, unspecified: Secondary | ICD-10-CM | POA: Diagnosis not present

## 2017-05-06 DIAGNOSIS — R143 Flatulence: Secondary | ICD-10-CM

## 2017-05-06 DIAGNOSIS — Z1211 Encounter for screening for malignant neoplasm of colon: Secondary | ICD-10-CM

## 2017-05-06 LAB — IGA: IGA: 417 mg/dL — AB (ref 68–378)

## 2017-05-06 MED ORDER — NA SULFATE-K SULFATE-MG SULF 17.5-3.13-1.6 GM/177ML PO SOLN: 1.0000 | Freq: Once | ORAL | 0 refills | Status: AC

## 2017-05-06 NOTE — Patient Instructions (Signed)
Your physician has requested that you go to the basement for the following lab work before leaving today:  TTG, IGA  You have been scheduled for a colonoscopy. Please follow written instructions given to you at your visit today.  Please pick up your prep supplies at the pharmacy within the next 1-3 days. If you use inhalers (even only as needed), please bring them with you on the day of your procedure. Your physician has requested that you go to www.startemmi.com and enter the access code given to you at your visit today. This web site gives a general overview about your procedure. However, you should still follow specific instructions given to you by our office regarding your preparation for the procedure.   

## 2017-05-06 NOTE — Progress Notes (Signed)
HISTORY OF PRESENT ILLNESS:  Carolyn Gaines is a 51 y.o. female , Community education officer at [employer], with coronary artery disease status post CABG March 2018, anxiety/depression, fibromyalgia, symptomatic cholelithiasis status post cholecystectomy June 2018, and a remote history of bleeding peptic ulcer disease around age 34. The patient is sent today by her primary care provider Dr. Kari Gaines for evaluation of chief complaints of chest pain, increased intestinal gas, and chronic constipation. The patient was seen in the emergency room last month with complaints of chest pain. Negative cardiac assessment. At that time she was experiencing significant reflux symptoms. She was placed on pantoprazole 40 mg twice daily. She tells me that her chest pain has resolved but she still has reflux type symptoms. She does take baby aspirin daily. She was having problems previously with right-sided abdominal pain. However, this has resolved post cholecystectomy. She reports that she has had difficulties with foul-smelling flatus for many years. She has not had workup. No endoscopic procedures other than EGD around age 19. She also reports chronic constipation. She moves her bowels once every several days. She does not take any agents to help with her constipation. She will occasionally see minor blood with difficult bowel movements. She attributes this to hemorrhoids. Review of outside blood work from 04/04/2017 finds a unremarkable comprehensive metabolic panel with normal liver tests and lipase. CBC remarkable for mild anemia with hemoglobin 10.9. Normal MCV. Abdominal ultrasound from May 2018 revealed cholelithiasis but was otherwise unremarkable. Normal common bile duct. Cholecystectomy operative report from 01/25/2017 reviewed. No IOC reported. The patient reports that she has been under great deal of stress secondary to her personal health problems in addition to her husband passing away this year  REVIEW OF  SYSTEMS:  All non-GI ROS negative unless otherwise stated in the history of present illness except for arthritis, back pain, depression, lower extremity swelling  Past Medical History:  Diagnosis Date  . Anxiety   . Arthritis   . Depression   . Fibromyalgia   . Gallstones   . MI (myocardial infarction) (HCC) 01/2016  . Peptic ulcer     Past Surgical History:  Procedure Laterality Date  . ABDOMINAL HYSTERECTOMY    . CARDIAC CATHETERIZATION N/A 02/19/2016   Procedure: Left Heart Cath and Coronary Angiography;  Surgeon: Carolyn Decamp, MD;  Location: [facility] INVASIVE CV LAB;  Service: Cardiovascular;  Laterality: N/A;  . CARDIAC CATHETERIZATION N/A 02/19/2016   Procedure: Coronary Stent Intervention;  Surgeon: Carolyn Decamp, MD;  Location: [facility] INVASIVE CV LAB;  Service: Cardiovascular;  Laterality: N/A;  . CESAREAN SECTION     x 2  . CHOLECYSTECTOMY N/A 01/25/2017   Procedure: LAPAROSCOPIC CHOLECYSTECTOMY;  Surgeon: Carolyn Macho, MD;  Location: [facility] ORS;  Service: General;  Laterality: N/A;  . CORONARY ARTERY BYPASS GRAFT N/A 10/26/2016   Procedure: CORONARY ARTERY BYPASS GRAFTING (CABG), ON PUMP, TIMES THREE, USING LEFT INTERNAL MAMMARY ARTERY AND ENDOSCOPICALLY HARVESTED RIGHT GREATER SAPHENOUS VEIN;  Surgeon: Carolyn Borne, MD;  Location: [facility] OR;  Service: Open Heart Surgery;  Laterality: N/A;  LIMA-LAD SVG-RCA SVG-OM  . LEFT HEART CATH AND CORONARY ANGIOGRAPHY N/A 10/22/2016   Procedure: Left Heart Cath and Coronary Angiography;  Surgeon: Carolyn Bihari, MD;  Location: [facility] INVASIVE CV LAB;  Service: Cardiovascular;  Laterality: N/A;  . TEE WITHOUT CARDIOVERSION N/A 10/26/2016   Procedure: TRANSESOPHAGEAL ECHOCARDIOGRAM (TEE);  Surgeon: Carolyn Borne, MD;  Location: [facility] OR;  Service: Open Heart Surgery;  Laterality: N/A;  Social History Carolyn Gaines  reports that she quit smoking about 6 years ago. She has never used smokeless tobacco. She reports that she drinks alcohol. She reports that she does  not use drugs.  family history includes Arthritis in her mother; COPD in her mother; Diabetes in her maternal aunt, maternal aunt, and maternal grandmother; Heart disease in her father and mother; Psoriasis in her father; Stroke in her mother.  Allergies  Allergen Reactions  . Bee Venom Other (See Comments)    Pus sites at injection  . Adhesive [Tape] Itching and Swelling    Please use paper tape  . Triple Antibiotic [Bacitracin-Neomycin-Polymyxin] Rash       PHYSICAL EXAMINATION: Vital signs: BP 106/68 (BP Location: Left Arm, Patient Position: Sitting, Cuff Size: Normal)   Pulse 84   Ht  (1.6 m) Comment: height measured without shoes  Wt 161 lb (73 kg)   BMI 28.52 kg/m   Constitutional: generally well-appearing, no acute distress Psychiatric: alert and oriented x3, cooperative Eyes: extraocular movements intact, anicteric, conjunctiva pink Mouth: oral pharynx moist, no lesions Neck: supple Without thyromegaly Lymph: no lymphadenopathy Cardiovascular: heart regular rate and rhythm, no murmur Lungs: clear to auscultation bilaterally Abdomen: soft, nontender, nondistended, no obvious ascites, no peritoneal signs, normal bowel sounds, no organomegaly. She does complain of superficial point tenderness in multiple locations (consistent with fibromyalgia) Rectal: Deferred until colonoscopy Extremities: no clubbing or cyanosis. 1+ lower extremity edema on the right only Skin: no lesions on visible extremities Neuro: No focal deficits. Normal DTRs. No asterixis.  ASSESSMENT:  #1. Recent problems with noncardiac chest pain. Improved after initiating PPI. Question GERD. Question peptic ulcer disease #2. History of bleeding peptic ulcer remotely. No details. On baby aspirin. Not on PPI until recently #3. GERD by history. Reports symptoms despite PPI #4. Chronic constipation. Ongoing problem #5. Increased intestinal gas as manifested by foul-smelling flatus. Ongoing problem. Rule  out bacterial overgrowth. Rule out celiac. #6. History of symptomatic cholelithiasis status post laparoscopic cholecystectomy #7. Colon cancer screening. She has not had screening today. Interested in colonoscopy #8. Multiple medical problems including coronary artery disease status post CABG  PLAN:  #1. Discussion on intestinal gas for educational purposes. Questions answered #2. Literature on intestinal gas provided for her review #3. Anti-gas and flatulence dietary sheet provided for her review #4. Celiac testing with tissue transglutaminase antibody IgA and serum IgA level #5. Schedule screening colonoscopy. Patient is high-risk given her coronary artery disease.The nature of the procedure, as well as the risks, benefits, and alternatives were carefully and thoroughly reviewed with the patient. Ample time for discussion and questions allowed. The patient understood, was satisfied, and agreed to proceed. #6. Schedule upper endoscopy to evaluate noncardiac chest pain, reflux symptoms, and a history of ulcer disease. She will need biopsies to rule out Helicobacter pylori. She is high-risk as stated above.The nature of the procedure, as well as the risks, benefits, and alternatives were carefully and thoroughly reviewed with the patient. Ample time for discussion and questions allowed. The patient understood, was satisfied, and agreed to proceed. #7. Consider a trial of Xifaxan for intestinal gas #8. I advised her to initiate MiraLAX for her chronic constipation  A copy of this consultation note has been sent to Dr. Juanetta Gosling

## 2017-05-07 LAB — TISSUE TRANSGLUTAMINASE, IGA: (tTG) Ab, IgA: 2 U/mL

## 2017-05-08 ENCOUNTER — Telehealth: Payer: Self-pay | Admitting: Internal Medicine

## 2017-05-08 NOTE — Telephone Encounter (Signed)
See result note.  

## 2017-05-21 ENCOUNTER — Emergency Department (HOSPITAL_BASED_OUTPATIENT_CLINIC_OR_DEPARTMENT_OTHER)
Admission: EM | Admit: 2017-05-21 | Discharge: 2017-05-21 | Disposition: A | Discharge status: Home / Self Care | Payer: BLUE CROSS/BLUE SHIELD | Attending: Emergency Medicine | Admitting: Emergency Medicine

## 2017-05-21 ENCOUNTER — Encounter (HOSPITAL_BASED_OUTPATIENT_CLINIC_OR_DEPARTMENT_OTHER): Payer: Self-pay | Admitting: *Deleted

## 2017-05-21 ENCOUNTER — Emergency Department (HOSPITAL_BASED_OUTPATIENT_CLINIC_OR_DEPARTMENT_OTHER): Payer: BLUE CROSS/BLUE SHIELD

## 2017-05-21 DIAGNOSIS — I252 Old myocardial infarction: Secondary | ICD-10-CM | POA: Diagnosis not present

## 2017-05-21 DIAGNOSIS — Y9389 Activity, other specified: Secondary | ICD-10-CM | POA: Diagnosis not present

## 2017-05-21 DIAGNOSIS — Z87891 Personal history of nicotine dependence: Secondary | ICD-10-CM | POA: Diagnosis not present

## 2017-05-21 DIAGNOSIS — M79602 Pain in left arm: Secondary | ICD-10-CM | POA: Diagnosis not present

## 2017-05-21 DIAGNOSIS — Z951 Presence of aortocoronary bypass graft: Secondary | ICD-10-CM | POA: Insufficient documentation

## 2017-05-21 DIAGNOSIS — Y998 Other external cause status: Secondary | ICD-10-CM | POA: Diagnosis not present

## 2017-05-21 DIAGNOSIS — R072 Precordial pain: Secondary | ICD-10-CM | POA: Diagnosis not present

## 2017-05-21 DIAGNOSIS — I2510 Atherosclerotic heart disease of native coronary artery without angina pectoris: Secondary | ICD-10-CM | POA: Insufficient documentation

## 2017-05-21 DIAGNOSIS — Z7982 Long term (current) use of aspirin: Secondary | ICD-10-CM | POA: Insufficient documentation

## 2017-05-21 DIAGNOSIS — Y9289 Other specified places as the place of occurrence of the external cause: Secondary | ICD-10-CM | POA: Diagnosis not present

## 2017-05-21 DIAGNOSIS — Z79899 Other long term (current) drug therapy: Secondary | ICD-10-CM | POA: Diagnosis not present

## 2017-05-21 DIAGNOSIS — R079 Chest pain, unspecified: Secondary | ICD-10-CM | POA: Diagnosis not present

## 2017-05-21 DIAGNOSIS — I10 Essential (primary) hypertension: Secondary | ICD-10-CM | POA: Diagnosis not present

## 2017-05-21 HISTORY — DX: Atherosclerotic heart disease of native coronary artery without angina pectoris: I25.10

## 2017-05-21 LAB — COMPREHENSIVE METABOLIC PANEL
ALBUMIN: 3.8 g/dL (ref 3.5–5.0)
ALT: 10 U/L — ABNORMAL LOW (ref 14–54)
AST: 19 U/L (ref 15–41)
Alkaline Phosphatase: 76 U/L (ref 38–126)
Anion gap: 5 (ref 5–15)
BUN: 10 mg/dL (ref 6–20)
CHLORIDE: 108 mmol/L (ref 101–111)
CO2: 24 mmol/L (ref 22–32)
Calcium: 9 mg/dL (ref 8.9–10.3)
Creatinine, Ser: 0.89 mg/dL (ref 0.44–1.00)
Glucose, Bld: 87 mg/dL (ref 65–99)
POTASSIUM: 3.5 mmol/L (ref 3.5–5.1)
SODIUM: 137 mmol/L (ref 135–145)
TOTAL PROTEIN: 7.7 g/dL (ref 6.5–8.1)
Total Bilirubin: 0.6 mg/dL (ref 0.3–1.2)

## 2017-05-21 LAB — CBC WITH DIFFERENTIAL/PLATELET
BASOS ABS: 0 10*3/uL (ref 0.0–0.1)
Basophils Relative: 0 %
EOS ABS: 0.1 10*3/uL (ref 0.0–0.7)
Eosinophils Relative: 3 %
HCT: 35.1 % — ABNORMAL LOW (ref 36.0–46.0)
HEMOGLOBIN: 11.3 g/dL — AB (ref 12.0–15.0)
LYMPHS ABS: 1.1 10*3/uL (ref 0.7–4.0)
LYMPHS PCT: 30 %
MCH: 27.8 pg (ref 26.0–34.0)
MCHC: 32.2 g/dL (ref 30.0–36.0)
MCV: 86.5 fL (ref 78.0–100.0)
Monocytes Absolute: 0.3 10*3/uL (ref 0.1–1.0)
Monocytes Relative: 8 %
NEUTROS PCT: 59 %
Neutro Abs: 2.1 10*3/uL (ref 1.7–7.7)
Platelets: 229 10*3/uL (ref 150–400)
RBC: 4.06 MIL/uL (ref 3.87–5.11)
RDW: 15.1 % (ref 11.5–15.5)
WBC: 3.5 10*3/uL — AB (ref 4.0–10.5)

## 2017-05-21 LAB — TROPONIN I: Troponin I: <0.03 ng/mL (ref <0.03)

## 2017-05-21 MED ORDER — ASPIRIN 81 MG PO CHEW
324.0000 mg | CHEWABLE_TABLET | Freq: Once | ORAL | Status: AC
Start: 2017-05-21 — End: 2017-05-21
  Administered 2017-05-21: 324 mg via ORAL
  Filled 2017-05-21: qty 4

## 2017-05-21 MED ORDER — MORPHINE SULFATE (PF) 4 MG/ML IV SOLN
4.0000 mg | Freq: Once | INTRAVENOUS | Status: AC
  Administered 2017-05-21: 4 mg via INTRAVENOUS
  Filled 2017-05-21: qty 1

## 2017-05-21 MED ORDER — LORAZEPAM 2 MG/ML IJ SOLN
0.5000 mg | Freq: Once | INTRAMUSCULAR | Status: AC
  Administered 2017-05-21: 0.5 mg via INTRAVENOUS
  Filled 2017-05-21: qty 1

## 2017-05-21 MED ORDER — GI COCKTAIL ~~LOC~~
30.0000 mL | Freq: Once | ORAL | Status: AC
  Administered 2017-05-21: 30 mL via ORAL
  Filled 2017-05-21: qty 30

## 2017-05-21 MED ORDER — HYDROCODONE-ACETAMINOPHEN 5-325 MG PO TABS: 1.0000 | ORAL_TABLET | Freq: Four times a day (QID) | ORAL | 0 refills | Status: DC | PRN

## 2017-05-21 NOTE — ED Notes (Signed)
Pt. Sitting up reading on her phone, no distress noted.

## 2017-05-21 NOTE — ED Notes (Signed)
Triple bypass in March 2018 at [facility].

## 2017-05-21 NOTE — ED Triage Notes (Signed)
Left arm numbness and soreness in her chest for 20 minutes. She took 2 nitro with no change. She is anxious and crying at triage. Talking fast. Encouraged to slow down and let us help her.

## 2017-05-21 NOTE — ED Provider Notes (Signed)
.   Please see previous physicians note regarding patient's presenting history and physical, initial ED course, and associated medical decision making.  Repeat troponin is normal. After symptomatic control, her symptoms have improved.previous provider had spoken with cardiology was arranged for follow-up tomorrow at 2 PM with her cardiologist in [place]. She feels improved and states is ready for discharge home. Strict return and follow-up instructions reviewed. She expressed understanding of all discharge instructions and felt comfortable with the plan of care.    Lavera Guise, MD 05/21/17 [phone]

## 2017-05-21 NOTE — Discharge Instructions (Signed)
Please see your cardiologist tomorrow at 2 PM Your heart work-up has been reassuring today. Return for worsening symptoms

## 2017-05-21 NOTE — ED Provider Notes (Signed)
[facility] EMERGENCY DEPARTMENT Provider Note   CSN: [id number] Arrival date & time: 05/21/17  1248     History   Chief Complaint Chief Complaint  Patient presents with  . Arm Pain    HPI Carolyn Gaines is a 51 y.o. female.  The history is provided by the patient. No language interpreter was used.  Arm Pain     Carolyn Gaines is a 51 y.o. female who presents to the Emergency Department complaining of chest pain.  About 11:00 today while she was at work she developed severe, central chest pain radiating to the left side and left arm.  Pain is similar to her prior MI.  She took 2 nitroglycerin and reports that her chest pain is now just a soreness and she feels tired in general.  She has a persistent burning and heaviness in her left arm.  She denies any fevers, shortness of breath, cough, nausea, vomiting, diaphoresis, abdominal pain.  She has a history of STEMI with drug-eluting stent to the right RCA in July 2017 followed by a CABG in March 2018.  She takes a baby aspirin daily.  Symptoms are severe, constant, improving.  Past Medical History:  Diagnosis Date  . Anxiety   . Arthritis   . Coronary artery disease   . Depression   . Fibromyalgia   . Gallstones   . MI (myocardial infarction) (HCC) 01/2016  . Peptic ulcer     Patient Active Problem List   Diagnosis Date Noted  . Calculus of gallbladder without cholecystitis without obstruction   . 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 Unstable angina (HCC)   . Fibromyalgia 10/20/2016  . Essential hypertension 07/18/2016  . Psoriasis 07/18/2016  . Precordial chest pain 07/03/2016  . Depression with anxiety 07/03/2016  . Normocytic anemia 07/03/2016  . History of acute inferior wall MI 02/19/2016  . Coronary artery disease involving native heart without angina pectoris 02/19/2016    Past Surgical History:  Procedure Laterality Date  . ABDOMINAL HYSTERECTOMY    . CARDIAC CATHETERIZATION N/A 02/19/2016   Procedure: Left  Heart Cath and Coronary Angiography;  Surgeon: Yates Decamp, MD;  Location: [facility] INVASIVE CV LAB;  Service: Cardiovascular;  Laterality: N/A;  . CARDIAC CATHETERIZATION N/A 02/19/2016   Procedure: Coronary Stent Intervention;  Surgeon: Yates Decamp, MD;  Location: [facility] INVASIVE CV LAB;  Service: Cardiovascular;  Laterality: N/A;  . CESAREAN SECTION     x 2  . CHOLECYSTECTOMY N/A 01/25/2017   Procedure: LAPAROSCOPIC CHOLECYSTECTOMY;  Surgeon: Franky Macho, MD;  Location: [facility] ORS;  Service: General;  Laterality: N/A;  . CORONARY ARTERY BYPASS GRAFT N/A 10/26/2016   Procedure: CORONARY ARTERY BYPASS GRAFTING (CABG), ON PUMP, TIMES THREE, USING LEFT INTERNAL MAMMARY ARTERY AND ENDOSCOPICALLY HARVESTED RIGHT GREATER SAPHENOUS VEIN;  Surgeon: Alleen Borne, MD;  Location: [facility] OR;  Service: Open Heart Surgery;  Laterality: N/A;  LIMA-LAD SVG-RCA SVG-OM  . LEFT HEART CATH AND CORONARY ANGIOGRAPHY N/A 10/22/2016   Procedure: Left Heart Cath and Coronary Angiography;  Surgeon: Lennette Bihari, MD;  Location: [facility] INVASIVE CV LAB;  Service: Cardiovascular;  Laterality: N/A;  . TEE WITHOUT CARDIOVERSION N/A 10/26/2016   Procedure: TRANSESOPHAGEAL ECHOCARDIOGRAM (TEE);  Surgeon: Alleen Borne, MD;  Location: [facility] OR;  Service: Open Heart Surgery;  Laterality: N/A;    OB History    No data available       Home Medications    Prior to Admission medications   Medication Sig Start  Date End Date Taking? Authorizing Provider  acetaminophen (TYLENOL) 500 MG tablet Take 1,000 mg by mouth every 6 (six) hours as needed for mild pain.    [provider]  aspirin EC 81 MG EC tablet Take 1 tablet (81 mg total) by mouth daily. 02/20/16   Marcy Salvo, NP  carvedilol (COREG) 3.125 MG tablet Take 1 tablet (3.125 mg total) by mouth 2 (two) times daily with a meal. 03/20/17   [name], MD  escitalopram (LEXAPRO) 20 MG tablet Take 1 tablet (20 mg total) by mouth daily. 11/26/16   Jodelle Gross, NP    gabapentin (NEURONTIN) 300 MG capsule Take 1 capsule (300 mg total) by mouth 2 (two) times daily. Patient taking differently: Take 300 mg by mouth as needed.  03/19/17   [name], MD  nitroGLYCERIN (NITROSTAT) 0.4 MG SL tablet PLACE 1 T UNDER THE TONGUE Q 5 MINUTES FOR 3 DOSES AS NEEDED FOR CHEST PAIN 03/14/17   [name], MD  pantoprazole (PROTONIX) 40 MG tablet Take 40 mg by mouth at bedtime. 03/09/17   [provider]  rosuvastatin (CRESTOR) 40 MG tablet Take 1 tablet (40 mg total) by mouth daily. 03/19/17 06/17/17  [name], MD  traMADol (ULTRAM) 50 MG tablet TK 50 MG  PO QID PRN 03/13/17   [provider]    Family History Family History  Problem Relation Age of Onset  . COPD Mother   . Arthritis Mother   . Heart disease Mother   . Stroke Mother   . Psoriasis Father   . Heart disease Father   . Diabetes Maternal Grandmother   . Diabetes Maternal Aunt   . Diabetes Maternal Aunt     Social History Social History  Substance Use Topics  . Smoking status: Former Smoker    Quit date: 03/21/2011  . Smokeless tobacco: Never Used  . Alcohol use Yes     Comment: 2-3 per week     Allergies   Bee venom; Adhesive [tape]; and Triple antibiotic [bacitracin-neomycin-polymyxin]   Review of Systems Review of Systems  All other systems reviewed and are negative.    Physical Exam Updated Vital Signs BP 106/76   Pulse 76   Temp 98.1 F (36.7 C) (Oral)   Resp 20   Ht 5\' 3"  (1.6 m)   Wt 73 kg (161 lb)   SpO2 100%   BMI 28.52 kg/m   Physical Exam  Constitutional: She is oriented to person, place, and time. She appears well-developed and well-nourished.  HENT:  Head: Normocephalic and atraumatic.  Cardiovascular: Normal rate and regular rhythm.   No murmur heard. Pulmonary/Chest: Effort normal and breath sounds normal. No respiratory distress.  Left-sided chest wall tenderness to palpation  Abdominal: Soft. There is no tenderness.  There is no rebound and no guarding.  Musculoskeletal: She exhibits no edema.  2+ radial pulses bilaterally.  There is tenderness to palpation throughout the left upper arm without any erythema or masses.  Neurological: She is alert and oriented to person, place, and time.  5 out of 5 strength in all 4 extremities  Skin: Skin is warm and dry.  Psychiatric: She has a normal mood and affect. Her behavior is normal.  Nursing note and vitals reviewed.    ED Treatments / Results  Labs (all labs ordered are listed, but only abnormal results are displayed) Labs Reviewed  COMPREHENSIVE METABOLIC PANEL  CBC WITH DIFFERENTIAL/PLATELET  TROPONIN I    EKG  EKG Interpretation None       Radiology No results found.  Procedures Procedures (including critical care time)  Medications Ordered in ED Medications  aspirin chewable tablet 324 mg (not administered)     Initial Impression / Assessment and Plan / ED Course  I have reviewed the triage vital signs and the nursing notes.  Pertinent labs & imaging results that were available during my care of the patient were reviewed by me and considered in my medical decision making (see chart for details).     Vision here for evaluation of central chest pain radiating to her left arm with associated left arm numbness that started at 11 AM today.  Her pain had improved on initial evaluation in the emergency department.  EKG with no acute ischemic changes.  Initial set of labs are within normal limits.  Presentation is not consistent with PE, dissection.  There is a strong reproducible component to her symptoms, feel ACS is less likely.  Discussed with Dr. Tenny Crawoss with cardiology who recommends repeat troponin with cardiology follow-up tomorrow.  Patient care transferred pending repeat lab enzyme.  On repeat assessment and discussion of plan patient reports severe recurrent central chest pain.  She is crying on exam and very anxious appearing.  Will  recheck EKG with plan for repeat troponin.  Will treat her symptoms.  Question possible esophageal spasm contributing to her symptoms.  Final Clinical Impressions(s) / ED Diagnoses   Final diagnoses:  None    New Prescriptions New Prescriptions   No medications on file     Tilden Fossaees, Aayden Cefalu, MD 05/21/17 1606

## 2017-05-22 ENCOUNTER — Encounter: Payer: Self-pay | Admitting: Physician Assistant

## 2017-05-22 ENCOUNTER — Ambulatory Visit (INDEPENDENT_AMBULATORY_CARE_PROVIDER_SITE_OTHER): Payer: BLUE CROSS/BLUE SHIELD | Admitting: Physician Assistant

## 2017-05-22 VITALS — BP 112/82 | HR 82 | Ht 64.0 in | Wt 159.6 lb

## 2017-05-22 DIAGNOSIS — F418 Other specified anxiety disorders: Secondary | ICD-10-CM

## 2017-05-22 DIAGNOSIS — I10 Essential (primary) hypertension: Secondary | ICD-10-CM | POA: Diagnosis not present

## 2017-05-22 DIAGNOSIS — R072 Precordial pain: Secondary | ICD-10-CM

## 2017-05-22 DIAGNOSIS — I25810 Atherosclerosis of coronary artery bypass graft(s) without angina pectoris: Secondary | ICD-10-CM

## 2017-05-22 NOTE — Patient Instructions (Signed)
Medication Instructions:  Your physician recommends that you continue on your current medications as directed. Please refer to the Current Medication list given to you today.   Labwork: NONE   Testing/Procedures: NONE   Follow-Up: Your physician recommends that you schedule a follow-up appointment in: 1 Month    Any Other Special Instructions Will Be Listed Below (If Applicable).     If you need a refill on your cardiac medications before your next appointment, please call your pharmacy.  Thank you for choosing [facility]!   

## 2017-05-22 NOTE — Progress Notes (Signed)
Cardiology Office Note    Date:  05/22/2017   ID:  Carolyn Gaines, DOB [birth year], MRN [id number]  PCP:  Kari Baars, MD  Cardiologist: Dr. Wyline Mood   Chief Complaint  Patient presents with  . Chest Pain    History of Present Illness:  Carolyn Gaines is a 51 y.o. female With history of CAD status post inferior SI 01/2016 tr treated medically. Nuclear stress test 03/2016 with mild septal ischemia, intermediate risk. Underwent CABG 10/16/16 with a LIMA to the LAD, SVG to the OM, SVG to RCA because of a 90% ostial left main. 2-D echo LVEF 55-60% with no wall motion abnormalities.last saw Dr.Gabapentin was added for neuropathic pain.also has anxiety depression, fibromyalgia.  Patient seen in the ER 04/04/17 with chest pain felt to be atypical. Enzymes negative an EKG normal. She was treated with Protonix and GI consult recommended.atient saw Dr. Marina Goodell 05/06/17 who recommends endoscopy and colonoscopy which is scheduled 07/02/17.  Patient was seen in the [place] room yesterday with recurrent chest pain. Took 2 nitroglycerin and then just felt chest soreness.troponins normal and EKG without acute change. There was a strong reproducible component to her symptoms.discussed with Dr. Tenny Craw who recommended repeat troponin which was negative and follow-up when necessary the office today.  Patient tearful today. Had severe pin pricking sharp shooting pain radiating down left arm while in the ER without EKG changes. First time it happened she was sitting in a chair at work. She says the GI cocktail helped most but also Morphine and was given 4 pills of vicodan but that is making her sleepy. Very stressed out with husbands death this year.Has appt with Dr. 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 She denies any exertional chest tightness or pressureor shortness of breath.  Past Medical History:  Diagnosis Date  . Anxiety   . Arthritis   . Coronary artery  disease   . Depression   . Fibromyalgia   . Gallstones   . MI (myocardial infarction) (HCC) 01/2016  . Peptic ulcer     Past Surgical History:  Procedure Laterality Date  . ABDOMINAL HYSTERECTOMY    . CARDIAC CATHETERIZATION N/A 02/19/2016   Procedure: Left Heart Cath and Coronary Angiography;  Surgeon: Yates Decamp, MD;  Location: [facility] INVASIVE CV LAB;  Service: Cardiovascular;  Laterality: N/A;  . CARDIAC CATHETERIZATION N/A 02/19/2016   Procedure: Coronary Stent Intervention;  Surgeon: Yates Decamp, MD;  Location: [facility] INVASIVE CV LAB;  Service: Cardiovascular;  Laterality: N/A;  . CESAREAN SECTION     x 2  . CHOLECYSTECTOMY N/A 01/25/2017   Procedure: LAPAROSCOPIC CHOLECYSTECTOMY;  Surgeon: Franky Macho, MD;  Location: [facility] ORS;  Service: General;  Laterality: N/A;  . CORONARY ARTERY BYPASS GRAFT N/A 10/26/2016   Procedure: CORONARY ARTERY BYPASS GRAFTING (CABG), ON PUMP, TIMES THREE, USING LEFT INTERNAL MAMMARY ARTERY AND ENDOSCOPICALLY HARVESTED RIGHT GREATER SAPHENOUS VEIN;  Surgeon: Alleen Borne, MD;  Location: [facility] OR;  Service: Open Heart Surgery;  Laterality: N/A;  LIMA-LAD SVG-RCA SVG-OM  . LEFT HEART CATH AND CORONARY ANGIOGRAPHY N/A 10/22/2016   Procedure: Left Heart Cath and Coronary Angiography;  Surgeon: Lennette Bihari, MD;  Location: [facility] INVASIVE CV LAB;  Service: Cardiovascular;  Laterality: N/A;  . TEE WITHOUT CARDIOVERSION N/A 10/26/2016   Procedure: TRANSESOPHAGEAL ECHOCARDIOGRAM (TEE);  Surgeon: Alleen Borne, MD;  Location: [facility] OR;  Service: Open Heart Surgery;  Laterality: N/A;    Current Medications: No outpatient  prescriptions have been marked as taking for the 05/22/17 encounter (Office Visit) with Dyann KiefLenze, Aideen Fenster M, PA-C.     Allergies:   Bee venom; Adhesive [tape]; and Triple antibiotic [bacitracin-neomycin-polymyxin]   Social History   Social History  . Marital status: Widowed    Spouse name: N/A  . Number of children: 2  . Years of education: N/A   Occupational  History  . Education officer, communityassistant store manager    Social History Main Topics  . Smoking status: Former Smoker    Quit date: 03/21/2011  . Smokeless tobacco: Never Used  . Alcohol use Yes     Comment: 2-3 per week  . Drug use: No  . Sexual activity: Not Asked   Other Topics Concern  . None   Social History Narrative  . None     Family History:  [name] family history includes Arthritis in her mother; COPD in her mother; Diabetes in her maternal aunt, maternal aunt, and maternal grandmother; Heart disease in her father and mother; Psoriasis in her father; Stroke in her mother.   ROS:   Please see the history of present illness.    Review of Systems  Constitution: Positive for weight loss.  HENT: Negative.   Eyes: Negative.   Cardiovascular: Negative.   Respiratory: Negative.   Hematologic/Lymphatic: Negative.   Musculoskeletal: Positive for myalgias. Negative for joint pain.  Gastrointestinal: Positive for heartburn.  Genitourinary: Negative.   Neurological: Negative.   Psychiatric/Behavioral: Positive for depression. The patient is nervous/anxious.    All other systems reviewed and are negative.   PHYSICAL EXAM:   VS:  BP 112/82   Pulse 82   Ht 5\' 4"  (1.626 m)   Wt 159 lb 9.6 oz (72.4 kg)   SpO2 95% Comment: on room air  BMI 27.40 kg/m   Physical Exam  GEN: Well nourished, well developed, in no acute distress  Neck: no JVD, carotid bruits, or masses Cardiac:RRR; no murmurs, rubs, or gallops  Respiratory:  clear to auscultation bilaterally, normal work of breathing GI: soft, nontender, nondistended, + BS Ext: without cyanosis, clubbing, or edema, Good distal pulses bilaterally Neuro:  Alert and Oriented x 3 Psych: anxious, tearful  Wt Readings from Last 3 Encounters:  05/22/17 159 lb 9.6 oz (72.4 kg)  05/21/17 161 lb (73 kg)  05/06/17 161 lb (73 kg)      Studies/Labs Reviewed:   EKG:  EKG is not ordered today.  EKG reviewed from yesterday normal sinus rhythm,  normal EKG  Recent Labs: 07/03/2016: B Natriuretic Peptide 45.0 12/03/2016: Magnesium 2.1 05/21/2017: ALT 10; BUN 10; Creatinine, Ser 0.89; Hemoglobin 11.3; Platelets 229; Potassium 3.5; Sodium 137   Lipid Panel    Component Value Date/Time   CHOL 119 10/25/2016 0440   TRIG 177 (H) 10/25/2016 0440   HDL 45 10/25/2016 0440   CHOLHDL 2.6 10/25/2016 0440   VLDL 35 10/25/2016 0440   LDLCALC 39 10/25/2016 0440    Additional studies/ records that were reviewed today include:  ER records reviewed from [facility] yesterday.  TEE 10/26/16  Left ventricle: The left ventricular cavity was of normal size. There was mild global left ventricular hypokinesis with the estimated ejection fraction at 50--55%. There were no regional wall motion abnormalities. The post-bypass LV systolic function appeared unchanged from the pre-bypass study. The ejection fraction was estimated at 50-55% No intra-cardiac air was seen in the post-bypass views.  Mitral valve: The anterior mitral leaflet was mildly thickened. Both leaflet opened normally without restriction.  The leaflets coapted normally without prolapsing or flail segments. There was trace mitral insufficiency. There was trace mitral insufficiency in the post-bypass exam which was unchanged from the pre-bypass study.  Right ventricle: The right ventricular cavity was of normal size. There was normal appearing RV systolic function. The RV function appeared normal in the post-bypass exam. The RV was normal and there was normal contractility of the RV free wall.  Tricuspid valve: The tricuspid valve leaflets appeared structurally normal. There was trace tricuspid insufficiency.  Pulmonic valve: The pulmonic valve appeared normal with trace pulmonic insufficiency.  Septum: No Patent Foramen Ovale present.  Left atrium: Patent foramen ovale not present.  Left atrium: No spontaneous echo contrast.     Left Heart Cath and Coronary Angiography  Conclusion         Mid LAD lesion, 50 %stenosed.  Ost LM lesion, 85 %stenosed.  Mid RCA-1 lesion, 50 %stenosed.  Mid RCA-2 lesion, 0 %stenosed.  A drug eluting .  Prox RCA lesion, 20 %stenosed.   Severe native CAD with 80-90% ostial stenosis of the left main coronary artery which did not improve following IC nitroglycerin administration; 50% mid LAD stenoses; normal left circumflex vessel; and patent mid RCA stent with 45-50% smooth eccentric stenosis proximal to the stented segment and 20% proximal irregularity.   Mild  residual LV dysfunction with an ejection fraction of 45-50% with mild mid inferior hypocontractility.   RECOMMENDATION: The patient has been on full dose Brilinta since her DES stent implantation in July 2018.  Surgical consultation was obtained with Dr. Tyrone Sage.  Brilinta will be discontinued.  The patient will be stabilized on IV NTG with plans to reinstitute heparin. Plan for CABG surgery later this week following several days of Brilinta washout.   If the patient becomes unstable, consider emergent left main stenting.        ASSESSMENT:    1. Precordial chest pain   2. Coronary artery disease involving coronary bypass graft of native heart without angina pectoris   3. Essential hypertension   4. Depression with anxiety      PLAN:  In order of problems listed above:  Chest pain very atypical had EKGs and troponins done during her pain and EKG unchanged. Troponins negative 3. Very anxious and tearful with a lot of stress this year with her husband's death. Also history of peptic ulcer disease and is scheduled for an endoscopy and colonoscopy 07/02/17. Maybe this can be moved up. Follow-up with Dr. Wyline Mood in one month.  CAD status post STEMI 01/2016 treated withDES to the RCA by Dr.Gangi with residual LAD disease treated medically. Unstable angina 09/2016 underwent CABG with LIMA to the LAD, SVG to OM, SVG to RCA. Recurrent chest pain felt to be atypical. Follow-up with Dr.  Wyline Mood in one month.  Essential hypertension controlled  Anxiety depression has an appointment with Dr. Juanetta Gosling to discuss further treatment. Counseling may helpas well.    Medication Adjustments/Labs and Tests Ordered: Current medicines are reviewed at length with the patient today.  Concerns regarding medicines are outlined above.  Medication changes, Labs and Tests ordered today are listed in the Patient Instructions below. Patient Instructions  Medication Instructions:  Your physician recommends that you continue on your current medications as directed. Please refer to the Current Medication list given to you today.   Labwork: NONE   Testing/Procedures: NONE   Follow-Up: Your physician recommends that you schedule a follow-up appointment in: 1 Month    Any Other Special Instructions  Will Be Listed Below (If Applicable).     If you need a refill on your cardiac medications before your next appointment, please call your pharmacy.  Thank you for choosing [facility]!      Signed, [name], PA-C  05/22/2017 2:38 PM    [facility] [address] [place], [place], [place]  [address] Phone: [phone]; Fax: [phone]

## 2017-05-29 ENCOUNTER — Ambulatory Visit: Payer: BLUE CROSS/BLUE SHIELD | Admitting: Physician Assistant

## 2017-06-17 DIAGNOSIS — I2510 Atherosclerotic heart disease of native coronary artery without angina pectoris: Secondary | ICD-10-CM | POA: Diagnosis not present

## 2017-06-17 DIAGNOSIS — I10 Essential (primary) hypertension: Secondary | ICD-10-CM | POA: Diagnosis not present

## 2017-06-17 DIAGNOSIS — K210 Gastro-esophageal reflux disease with esophagitis: Secondary | ICD-10-CM | POA: Diagnosis not present

## 2017-06-17 DIAGNOSIS — F321 Major depressive disorder, single episode, moderate: Secondary | ICD-10-CM | POA: Diagnosis not present

## 2017-06-19 ENCOUNTER — Ambulatory Visit (INDEPENDENT_AMBULATORY_CARE_PROVIDER_SITE_OTHER): Payer: BLUE CROSS/BLUE SHIELD | Admitting: Cardiology

## 2017-06-19 ENCOUNTER — Encounter: Payer: Self-pay | Admitting: Cardiology

## 2017-06-19 VITALS — BP 106/66 | HR 70 | Ht 63.0 in | Wt 162.0 lb

## 2017-06-19 DIAGNOSIS — R0789 Other chest pain: Secondary | ICD-10-CM | POA: Diagnosis not present

## 2017-06-19 DIAGNOSIS — I2510 Atherosclerotic heart disease of native coronary artery without angina pectoris: Secondary | ICD-10-CM

## 2017-06-19 NOTE — Progress Notes (Signed)
Clinical Summary Carolyn Gaines is a 51 y.o.female seen today for follow up of the following medical problems.   1. 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 Residual LAD disease 60-70% managed medically. - From Dr Verl DickerGanji's notes nuclear stress 03/2016 (after stent) with mild septal ischemia, intermediate risk. - 09/2016 cath with 90% ostial LM. S/p cabg 10/16/16 wth LIMA-LAD, SVG-OM, SVG-RCA - 09/2016 echo LVEF 55-60%, no WMAs - Has not been on ACE-I due to soft bp's  - denies any chest pain.  - compliant with meds  - she has ongoing chest wall pain since her surgery.   - gabapentin causes nausea, stopped taking. Even 300mg  daily caused symptoms.  - midchest, burning like pain. +SOB. Pain at surgical site. Tender to pressing on that area. Lasts few minutes at a time. Can be brought on by position.    SH: husband recently passed away, had been ill for long time. She has started hiking, doing things on her "bucket list"  Works at pharmacy. Past Medical History:  Diagnosis Date  . Anxiety   . Arthritis   . Coronary artery disease   . Depression   . Fibromyalgia   . Gallstones   . MI (myocardial infarction) (HCC) 01/2016  . Peptic ulcer      Allergies  Allergen Reactions  . Bee Venom Other (See Comments)    Pus sites at injection  . Adhesive [Tape] Itching and Swelling    Please use paper tape  . Triple Antibiotic [Bacitracin-Neomycin-Polymyxin] Rash     Current Outpatient Medications  Medication Sig Dispense Refill  . acetaminophen (TYLENOL) 500 MG tablet Take 1,000 mg by mouth every 6 (six) hours as needed for mild pain.    Marland Kitchen. aspirin EC 81 MG EC tablet Take 1 tablet (81 mg total) by mouth daily. 30 tablet 11  . carvedilol (COREG) 3.125 MG tablet Take 1 tablet (3.125 mg total) by mouth 2 (two) times daily with a meal. 180 tablet 3  . escitalopram (LEXAPRO) 20 MG tablet Take 1 tablet (20 mg total) by mouth daily. 30 tablet 0  . gabapentin (NEURONTIN)  300 MG capsule Take 1 capsule (300 mg total) by mouth 2 (two) times daily. (Patient taking differently: Take 300 mg by mouth as needed. ) 180 capsule 3  . HYDROcodone-acetaminophen (NORCO/VICODIN) 5-325 MG tablet Take 1 tablet by mouth every 6 (six) hours as needed for moderate pain or severe pain. 4 tablet 0  . nitroGLYCERIN (NITROSTAT) 0.4 MG SL tablet PLACE 1 T UNDER THE TONGUE Q 5 MINUTES FOR 3 DOSES AS NEEDED FOR CHEST PAIN 25 tablet 3  . pantoprazole (PROTONIX) 40 MG tablet Take 40 mg by mouth at bedtime.  12  . rosuvastatin (CRESTOR) 40 MG tablet Take 1 tablet (40 mg total) by mouth daily. 90 tablet 3  . traMADol (ULTRAM) 50 MG tablet TK 50 MG  PO QID PRN  5   No current facility-administered medications for this visit.      Past Surgical History:  Procedure Laterality Date  . ABDOMINAL HYSTERECTOMY    . CARDIAC CATHETERIZATION N/A 02/19/2016   Procedure: Left Heart Cath and Coronary Angiography;  Surgeon: Yates DecampJay Ganji, MD;  [facility];  Service: Cardiovascular;  Laterality: N/A;  . CARDIAC CATHETERIZATION N/A 02/19/2016   Procedure: Coronary Stent Intervention;  Surgeon: Yates DecampJay Ganji, MD;  [facility];  Service: Cardiovascular;  Laterality: N/A;  . CESAREAN SECTION  x 2  . CHOLECYSTECTOMY N/A 01/25/2017   Procedure: LAPAROSCOPIC CHOLECYSTECTOMY;  Surgeon: Franky Macho, MD;  Location: [facility] ORS;  Service: General;  Laterality: N/A;  . CORONARY ARTERY BYPASS GRAFT N/A 10/26/2016   Procedure: CORONARY ARTERY BYPASS GRAFTING (CABG), ON PUMP, TIMES THREE, USING LEFT INTERNAL MAMMARY ARTERY AND ENDOSCOPICALLY HARVESTED RIGHT GREATER SAPHENOUS VEIN;  Surgeon: Alleen Borne, MD;  Location: [facility] OR;  Service: Open Heart Surgery;  Laterality: N/A;  LIMA-LAD SVG-RCA SVG-OM  . LEFT HEART CATH AND CORONARY ANGIOGRAPHY N/A 10/22/2016   Procedure: Left Heart Cath and Coronary Angiography;  Surgeon: Lennette Bihari, MD;  Location: [facility] INVAS[facility] CV LAB;  Service:  Cardiovascular;  Laterality: N/A;  . TEE WITHOUT CARDIOVERSION N/A 10/26/2016   Procedure: TRANSESOPHAGEAL ECHOCARDIOGRAM (TEE);  Surgeon: Alleen Borne, MD;  Location: [facility];  Service: Open Heart Surgery;  Laterality: N/A;     Allergies  Allergen Reactions  . Bee Venom Other (See Comments)    Pus sites at injection  . Adhesive [Tape] Itching and Swelling    Please use paper tape  . Triple Antibiotic [Bacitracin-Neomycin-Polymyxin] Rash      Family History  Problem Relation Age of Onset  . COPD Mother   . Arthritis Mother   . Heart disease Mother   . Stroke Mother   . Psoriasis Father   . Heart disease Father   . Diabetes Maternal Grandmother   . Diabetes Maternal Aunt   . Diabetes Maternal Aunt      Social History Carolyn Gaines reports that she quit smoking about 6 years ago. she has never used smokeless tobacco. Carolyn Gaines reports that she drinks alcohol.   Review of Systems CONSTITUTIONAL: No weight loss, fever, chills, weakness or fatigue.  HEENT: Eyes: No visual loss, blurred vision, double vision or yellow sclerae.No hearing loss, sneezing, congestion, runny nose or sore throat.  SKIN: No rash or itching.  CARDIOVASCULAR: per hpi RESPIRATORY: No shortness of breath, cough or sputum.  GASTROINTESTINAL: No anorexia, nausea, vomiting or diarrhea. No abdominal pain or blood.  GENITOURINARY: No burning on urination, no polyuria NEUROLOGICAL: No headache, dizziness, syncope, paralysis, ataxia, numbness or tingling in the extremities. No change in bowel or bladder control.  MUSCULOSKELETAL: +chest wall pain  LYMPHATICS: No enlarged nodes. No history of splenectomy.  PSYCHIATRIC: No history of depression or anxiety.  ENDOCRINOLOGIC: No reports of sweating, cold or heat intolerance. No polyuria or polydipsia.  Marland Kitchen   Physical Examination Vitals:   06/19/17 1335  BP: 106/66  Pulse: 70  SpO2: 97%   Vitals:   06/19/17 1335  Weight: 162 lb (73.5 kg)  Height: 5\' 3"  (1.6  m)    Gen: resting comfortably, no acute distress HEENT: no scleral icterus, pupils equal round and reactive, no palptable cervical adenopathy,  CV: RRR, no m//r,g no jvd Resp: Clear to auscultation bilaterally GI: abdomen is soft, non-tender, non-distended, normal bowel sounds, no hepatosplenomegaly MSK: extremities are warm, no edema.  Skin: warm, no rash Neuro:  no focal deficits Psych: appropriate affect   Diagnostic Studies     Assessment and Plan  1. CAD/Chest wall pain - no recent cardiac symptoms. Has some chest wall pain at surgical site consistent with neuropathic pain. - did not tolerate even low dose gabapentin - will have her f/u with CT surgery to see if any other treatment options for her chestwall/neuropathic pain.  - from cardiac standpoint continue current meds     [name], M.D.

## 2017-06-19 NOTE — Patient Instructions (Signed)
Your physician wants you to follow-up in: 6 months with Dr.Branch You will receive a reminder letter in the mail two months in advance. If you don't receive a letter, please call our office to schedule the follow-up appointment.    Your physician recommends that you continue on your current medications as directed. Please refer to the Current Medication list given to you today.    If you need a refill on your cardiac medications before your next appointment, please call your pharmacy.    I will place a referral to Dr.Bartle for follow up    No lab work or tests ordered.    Thank you for choosing [facility] !

## 2017-06-25 ENCOUNTER — Encounter: Payer: Self-pay | Admitting: Cardiology

## 2017-06-26 ENCOUNTER — Encounter: Payer: Self-pay | Admitting: Internal Medicine

## 2017-07-02 ENCOUNTER — Other Ambulatory Visit: Payer: Self-pay

## 2017-07-02 ENCOUNTER — Encounter: Payer: Self-pay | Admitting: Internal Medicine

## 2017-07-02 ENCOUNTER — Ambulatory Visit (AMBULATORY_SURGERY_CENTER): Payer: BLUE CROSS/BLUE SHIELD | Admitting: Internal Medicine

## 2017-07-02 VITALS — BP 116/4 | HR 69 | Temp 97.8°F | Resp 14 | Ht 63.0 in | Wt 162.0 lb

## 2017-07-02 DIAGNOSIS — R079 Chest pain, unspecified: Secondary | ICD-10-CM

## 2017-07-02 DIAGNOSIS — Z1211 Encounter for screening for malignant neoplasm of colon: Secondary | ICD-10-CM | POA: Diagnosis not present

## 2017-07-02 DIAGNOSIS — K219 Gastro-esophageal reflux disease without esophagitis: Secondary | ICD-10-CM

## 2017-07-02 DIAGNOSIS — K5900 Constipation, unspecified: Secondary | ICD-10-CM

## 2017-07-02 MED ORDER — SODIUM CHLORIDE 0.9 % IV SOLN: 500.0000 mL | Freq: Once | INTRAVENOUS | Status: DC

## 2017-07-02 NOTE — Op Note (Signed)
[facility] Patient Name: Carolyn Gaines Procedure Date: 07/02/2017 1:03 PM MRN: [id number] Endoscopist: Wilhemina Bonito. Marina Goodell , MD Age: 51 Referring MD:  Date of Birth: 1966-05-14 Gender: Female Account #: [id number] Procedure:                Upper GI endoscopy, with biopsies Indications:              Esophageal reflux, Unexplained chest pain. Remote                            history of peptic ulcer disease reported Medicines:                Monitored Anesthesia Care Procedure:                Pre-Anesthesia Assessment:                           - Prior to the procedure, a History and Physical                            was performed, and patient medications and                            allergies were reviewed. [name] tolerance of                            previous anesthesia was also reviewed. The risks                            and benefits of the procedure and the sedation                            options and risks were discussed with the patient.                            All questions were answered, and informed consent                            was obtained. Prior Anticoagulants: The patient has                            taken no previous anticoagulant or antiplatelet                            agents. ASA Grade Assessment: II - A patient with                            mild systemic disease. After reviewing the risks                            and benefits, the patient was deemed in                            satisfactory condition to undergo the procedure.  After obtaining informed consent, the endoscope was                            passed under direct vision. Throughout the                            procedure, the patient's blood pressure, pulse, and                            oxygen saturations were monitored continuously. The                            Endoscope was introduced through the mouth, and   advanced to the second part of duodenum. The upper                            GI endoscopy was accomplished without difficulty.                            The patient tolerated the procedure well. Scope In: Scope Out: Findings:                 The esophagus was normal.                           The stomach was normal. Biopsies were taken with a                            cold forceps for Helicobacter pylori testing using                            CLOtest.                           The examined duodenum was normal.                           The cardia and gastric fundus were normal on                            retroflexion. Complications:            No immediate complications. Estimated Blood Loss:     Estimated blood loss: none. Impression:               - Normal esophagus.                           - Normal stomach. Biopsied.                           - Normal examined duodenum. Recommendation:           1. Reflux precautions                           2. Okay to use PPI on demand for classic reflux  symptoms                           3. Follow-up CLO testing                           4. Return to the care of your primary care provider. Wilhemina BonitoJohn N. Marina GoodellPerry, MD 07/02/2017 1:50:25 PM This report has been signed electronically.

## 2017-07-02 NOTE — Progress Notes (Signed)
Called to room to assist during endoscopic procedure.  Patient ID and intended procedure confirmed with present staff. Received instructions for my participation in the procedure from the performing physician.  

## 2017-07-02 NOTE — Patient Instructions (Signed)
YOU HAD AN ENDOSCOPIC PROCEDURE TODAY AT THE [facility]:   Refer to the procedure report that was given to you for any specific questions about what was found during the examination.  If the procedure report does not answer your questions, please call your gastroenterologist to clarify.  If you requested that your care partner not be given the details of your procedure findings, then the procedure report has been included in a sealed envelope for you to review at your convenience later.  YOU SHOULD EXPECT: Some feelings of bloating in the abdomen. Passage of more gas than usual.  Walking can help get rid of the air that was put into your GI tract during the procedure and reduce the bloating. If you had a lower endoscopy (such as a colonoscopy or flexible sigmoidoscopy) you may notice spotting of blood in your stool or on the toilet paper. If you underwent a bowel prep for your procedure, you may not have a normal bowel movement for a few days.  Please Note:  You might notice some irritation and congestion in your nose or some drainage.  This is from the oxygen used during your procedure.  There is no need for concern and it should clear up in a day or so.  SYMPTOMS TO REPORT IMMEDIATELY:   Following lower endoscopy (colonoscopy or flexible sigmoidoscopy):  Excessive amounts of blood in the stool  Significant tenderness or worsening of abdominal pains  Swelling of the abdomen that is new, acute  Fever of 100F or higher   Following upper endoscopy (EGD)  Vomiting of blood or coffee ground material  New chest pain or pain under the shoulder blades  Painful or persistently difficult swallowing  New shortness of breath  Fever of 100F or higher  Black, tarry-looking stools  For urgent or emergent issues, a gastroenterologist can be reached at any hour by calling [phone].   DIET:  We do recommend a small meal at first, but then you may proceed to your regular diet.  Drink  plenty of fluids but you should avoid alcoholic beverages for 24 hours.  MEDICATIONS: Continue present medications.  Please see handouts given to you by your recovery nurse.  FOLLOW-UP CARE: Use anti-reflux precautions (see handout given to you by your recovery nurse). OK to use medication as needed for reflux symptoms.  ACTIVITY:  You should plan to take it easy for the rest of today and you should NOT DRIVE or use heavy machinery until tomorrow (because of the sedation medicines used during the test).    FOLLOW UP: Our staff will call the number listed on your records the next business day following your procedure to check on you and address any questions or concerns that you may have regarding the information given to you following your procedure. If we do not reach you, we will leave a message.  However, if you are feeling well and you are not experiencing any problems, there is no need to return our call.  We will assume that you have returned to your regular daily activities without incident.  If any biopsies were taken you will be contacted by phone or by letter within the next 1-3 weeks.  Please call us at [phone] if you have not heard about the biopsies in 3 weeks.   Thank you for allowing [place] to provide for your healthcare needs today.  SIGNATURES/CONFIDENTIALITY: You and/or your care partner have signed paperwork which will be entered into your electronic medical  record.  These signatures attest to the fact that that the information above on your After Visit Summary has been reviewed and is understood.  Full responsibility of the confidentiality of this discharge information lies with you and/or your care-partner. 

## 2017-07-02 NOTE — Op Note (Signed)
[facility] Patient Name: Carolyn Pattersonancy Gaines Procedure Date: 07/02/2017 1:03 PM MRN: [id number] Endoscopist: Carolyn BonitoJohn N. Marina GoodellPerry , MD Age: [age] Referring MD:  Date of Birth: 02/20/1966 Gender: Female Account #: [id number] Procedure:                Colonoscopy Indications:              Screening for colorectal malignant neoplasm Medicines:                Monitored Anesthesia Care Procedure:                Pre-Anesthesia Assessment:                           - Prior to the procedure, a History and Physical                            was performed, and patient medications and                            allergies were reviewed. [name] tolerance of                            previous anesthesia was also reviewed. The risks                            and benefits of the procedure and the sedation                            options and risks were discussed with the patient.                            All questions were answered, and informed consent                            was obtained. Prior Anticoagulants: The patient has                            taken no previous anticoagulant or antiplatelet                            agents. After reviewing the risks and benefits, the                            patient was deemed in satisfactory condition to                            undergo the procedure.                           After obtaining informed consent, the colonoscope                            was passed under direct vision. Throughout the  procedure, the patient's blood pressure, pulse, and                            oxygen saturations were monitored continuously. The                            Model CF-HQ190L [id number]) scope was introduced                            through the anus and advanced to the the cecum,                            identified by appendiceal orifice and ileocecal                            valve. The ileocecal valve,  appendiceal orifice,                            and rectum were photographed. The quality of the                            bowel preparation was excellent. The colonoscopy                            was performed without difficulty. The patient                            tolerated the procedure well. The bowel preparation                            used was SUPREP. Scope In: 1:20:45 PM Scope Out: 1:36:23 PM Scope Withdrawal Time: 0 hours 13 minutes 58 seconds  Total Procedure Duration: 0 hours 15 minutes 38 seconds  Findings:                 The entire examined colon appeared normal on direct                            and retroflexion views. Complications:            No immediate complications. Estimated blood loss:                            None. Estimated Blood Loss:     Estimated blood loss: none. Impression:               - The entire examined colon is normal on direct and                            retroflexion views.                           - No specimens collected. Recommendation:           - Repeat colonoscopy in 10 years for screening  purposes.                           - Patient has a contact number available for                            emergencies. The signs and symptoms of potential                            delayed complications were discussed with the                            patient. Return to normal activities tomorrow.                            Written discharge instructions were provided to the                            patient.                           - Resume previous diet.                           - Continue present medications.                           - EGD today. Please see report Carolyn Gaines. Marina Goodell, MD 07/02/2017 1:40:57 PM This report has been signed electronically.

## 2017-07-02 NOTE — Progress Notes (Signed)
Pt's states no medical or surgical changes since previsit or office visit. 

## 2017-07-02 NOTE — Progress Notes (Signed)
A/ox3 pleased with MAC, report to Sarah RN 

## 2017-07-03 ENCOUNTER — Telehealth: Payer: Self-pay | Admitting: *Deleted

## 2017-07-03 ENCOUNTER — Ambulatory Visit: Payer: BLUE CROSS/BLUE SHIELD | Admitting: Surgery

## 2017-07-03 ENCOUNTER — Other Ambulatory Visit: Payer: Self-pay

## 2017-07-03 ENCOUNTER — Encounter: Payer: Self-pay | Admitting: Surgery

## 2017-07-03 VITALS — BP 120/75 | HR 79 | Resp 20 | Ht 63.0 in | Wt 162.0 lb

## 2017-07-03 DIAGNOSIS — R0789 Other chest pain: Secondary | ICD-10-CM

## 2017-07-03 DIAGNOSIS — Z951 Presence of aortocoronary bypass graft: Secondary | ICD-10-CM

## 2017-07-03 DIAGNOSIS — I2510 Atherosclerotic heart disease of native coronary artery without angina pectoris: Secondary | ICD-10-CM | POA: Diagnosis not present

## 2017-07-03 DIAGNOSIS — G8918 Other acute postprocedural pain: Secondary | ICD-10-CM

## 2017-07-03 LAB — HELICOBACTER PYLORI SCREEN-BIOPSY: UREASE: NEGATIVE

## 2017-07-03 NOTE — Progress Notes (Signed)
HPI:  The patient is a 51 year old woman who underwent CABG x 3 on 10/26/2016. he patient's early postoperative recovery while in the hospital was notable for an uncomplicated postop course. Postop she continued to complain of tingling and burning pain over the skin of the left chest wall in the distribution of the left IMA. She was under a lot of emotional stress since her husband died shortly after her surgery of end stage COPD at [facility]. I last saw her in June  And she was still having some discomfort. She tried Neurontin but it gave her nausea even in low doses. She continues to complain of pain along the incision as well as along the side of her sternum more so in the upper chest. She sounds like she still has some numbness and tinging along the left chest wall in the distribution of the IMA.   Current Outpatient Medications  Medication Sig Dispense Refill  . acetaminophen (TYLENOL) 500 MG tablet Take 1,000 mg by mouth every 6 (six) hours as needed for mild pain.    Marland Kitchen aspirin EC 81 MG EC tablet Take 1 tablet (81 mg total) by mouth daily. 30 tablet 11  . carvedilol (COREG) 3.125 MG tablet Take 1 tablet (3.125 mg total) by mouth 2 (two) times daily with a meal. 180 tablet 3  . escitalopram (LEXAPRO) 20 MG tablet Take 1 tablet (20 mg total) by mouth daily. 30 tablet 0  . nitroGLYCERIN (NITROSTAT) 0.4 MG SL tablet PLACE 1 T UNDER THE TONGUE Q 5 MINUTES FOR 3 DOSES AS NEEDED FOR CHEST PAIN 25 tablet 3  . pantoprazole (PROTONIX) 40 MG tablet Take 40 mg by mouth at bedtime.  12  . traMADol (ULTRAM) 50 MG tablet TK 50 MG  PO QID PRN  5  . rosuvastatin (CRESTOR) 40 MG tablet Take 1 tablet (40 mg total) by mouth daily. 90 tablet 3   Current Facility-Administered Medications  Medication Dose Route Frequency Provider Last Rate Last Dose  . 0.9 %  sodium chloride infusion  500 mL Intravenous Once Hilarie Fredrickson, MD         Physical Exam: BP 120/75   Pulse 79   Resp 20   Ht 5\' 3"  (1.6 m)    Wt 162 lb (73.5 kg)   SpO2 98% Comment: RA  BMI 28.70 kg/m  She is emotional Cardiac exam shows a regular rate and rhythm with normal heart sounds Lungs are clear The chest incision is well-healed with a slightly thickened scar. There is some tenderness along the incisions and lateral to it. There is no sign of infection.  Diagnostic Tests:  CLINICAL DATA:  Chest and left arm pain.  EXAM: CHEST  2 VIEW  COMPARISON:  04/04/2017.  FINDINGS: Normal sized heart. Stable linear density at both lung bases. Otherwise, clear lungs. Mild thoracic spine degenerative changes. Stable post CABG changes and cholecystectomy clips.  IMPRESSION: No acute abnormality.  Stable bibasilar linear scarring.   Electronically Signed   By: Beckie Salts M.D.   On: 05/21/2017 14:12   Impression:  She has persistent discomfort along the sternotomy incision now 8 months postop. This is unusual and may be due to the sternal wires. She is a very anxious woman and emotional stress may be playing a part but I think the best option is to remove the sternal wires to see if that helps her pain. There is nothing else to offer her and in my experience  most of the time this resolves the pain. I discussed the surgical procedure with her including the risks of infection, persistent pain and scarring and she understands and agrees to proceed.   Plan:  Sternal wire removal next Thursday 07/11/2017.  I spent 15 minutes performing this established patient evaluation and > 50% of this time was spent face to face counseling and coordinating the care of this patient's persistent postop chest wall pain.    Alleen BorneBryan K Estevon Fluke, MD [facility] [phone]

## 2017-07-03 NOTE — Telephone Encounter (Signed)
  Follow up Call-  Call back number 07/02/2017  Post procedure Call Back phone  # [phone]  Permission to leave phone message Yes  Some recent data might be hidden     Patient questions:  Do you have a fever, pain , or abdominal swelling? No. Pain Score  0 *  Have you tolerated food without any problems? Yes.    Have you been able to return to your normal activities? Yes.    Do you have any questions about your discharge instructions: Diet   No. Medications  No. Follow up visit  No.  Do you have questions or concerns about your Care? No.  Actions: * If pain score is 4 or above: No action needed, pain <4.

## 2017-07-04 NOTE — Progress Notes (Signed)
t

## 2017-07-10 ENCOUNTER — Other Ambulatory Visit: Payer: Self-pay

## 2017-07-10 ENCOUNTER — Encounter (HOSPITAL_COMMUNITY): Payer: Self-pay | Admitting: *Deleted

## 2017-07-10 NOTE — Progress Notes (Signed)
Spoke with pt for pre-op call. Pt has hx of CAD with CABG in March, 2018. No recent chest pain or sob. Pt states she is not diabetic.

## 2017-07-11 ENCOUNTER — Encounter (HOSPITAL_COMMUNITY)
Admission: RE | Disposition: A | Discharge status: Home / Self Care | Payer: Self-pay | Source: Ambulatory Visit | Attending: Surgery

## 2017-07-11 ENCOUNTER — Ambulatory Visit (HOSPITAL_COMMUNITY): Payer: BLUE CROSS/BLUE SHIELD | Admitting: Certified Registered Nurse Anesthetist

## 2017-07-11 ENCOUNTER — Ambulatory Visit (HOSPITAL_COMMUNITY)
Admission: RE | Admit: 2017-07-11 | Discharge: 2017-07-11 | Disposition: A | Discharge status: Home / Self Care | Payer: BLUE CROSS/BLUE SHIELD | Source: Ambulatory Visit | Attending: Surgery | Admitting: Surgery

## 2017-07-11 ENCOUNTER — Encounter (HOSPITAL_COMMUNITY): Payer: Self-pay | Admitting: *Deleted

## 2017-07-11 ENCOUNTER — Ambulatory Visit (HOSPITAL_COMMUNITY): Payer: BLUE CROSS/BLUE SHIELD

## 2017-07-11 DIAGNOSIS — T8484XA Pain due to internal orthopedic prosthetic devices, implants and grafts, initial encounter: Secondary | ICD-10-CM | POA: Diagnosis not present

## 2017-07-11 DIAGNOSIS — M1990 Unspecified osteoarthritis, unspecified site: Secondary | ICD-10-CM | POA: Diagnosis not present

## 2017-07-11 DIAGNOSIS — F419 Anxiety disorder, unspecified: Secondary | ICD-10-CM | POA: Insufficient documentation

## 2017-07-11 DIAGNOSIS — Z8249 Family history of ischemic heart disease and other diseases of the circulatory system: Secondary | ICD-10-CM | POA: Diagnosis not present

## 2017-07-11 DIAGNOSIS — F329 Major depressive disorder, single episode, unspecified: Secondary | ICD-10-CM | POA: Insufficient documentation

## 2017-07-11 DIAGNOSIS — M797 Fibromyalgia: Secondary | ICD-10-CM | POA: Insufficient documentation

## 2017-07-11 DIAGNOSIS — G8918 Other acute postprocedural pain: Secondary | ICD-10-CM

## 2017-07-11 DIAGNOSIS — Z951 Presence of aortocoronary bypass graft: Secondary | ICD-10-CM | POA: Diagnosis not present

## 2017-07-11 DIAGNOSIS — I2510 Atherosclerotic heart disease of native coronary artery without angina pectoris: Secondary | ICD-10-CM | POA: Insufficient documentation

## 2017-07-11 DIAGNOSIS — Z87891 Personal history of nicotine dependence: Secondary | ICD-10-CM | POA: Insufficient documentation

## 2017-07-11 DIAGNOSIS — T85898A Other specified complication of other internal prosthetic devices, implants and grafts, initial encounter: Secondary | ICD-10-CM | POA: Diagnosis not present

## 2017-07-11 DIAGNOSIS — Z8711 Personal history of peptic ulcer disease: Secondary | ICD-10-CM | POA: Diagnosis not present

## 2017-07-11 DIAGNOSIS — Z7982 Long term (current) use of aspirin: Secondary | ICD-10-CM | POA: Diagnosis not present

## 2017-07-11 DIAGNOSIS — K8020 Calculus of gallbladder without cholecystitis without obstruction: Secondary | ICD-10-CM | POA: Diagnosis not present

## 2017-07-11 DIAGNOSIS — I252 Old myocardial infarction: Secondary | ICD-10-CM | POA: Diagnosis not present

## 2017-07-11 DIAGNOSIS — Z79899 Other long term (current) drug therapy: Secondary | ICD-10-CM | POA: Diagnosis not present

## 2017-07-11 DIAGNOSIS — Z01818 Encounter for other preprocedural examination: Secondary | ICD-10-CM

## 2017-07-11 DIAGNOSIS — I25700 Atherosclerosis of coronary artery bypass graft(s), unspecified, with unstable angina pectoris: Secondary | ICD-10-CM | POA: Diagnosis not present

## 2017-07-11 DIAGNOSIS — R0989 Other specified symptoms and signs involving the circulatory and respiratory systems: Secondary | ICD-10-CM | POA: Diagnosis not present

## 2017-07-11 DIAGNOSIS — R0789 Other chest pain: Secondary | ICD-10-CM | POA: Diagnosis not present

## 2017-07-11 DIAGNOSIS — I10 Essential (primary) hypertension: Secondary | ICD-10-CM | POA: Diagnosis not present

## 2017-07-11 HISTORY — DX: Gastro-esophageal reflux disease without esophagitis: K21.9

## 2017-07-11 HISTORY — DX: Unspecified convulsions: R56.9

## 2017-07-11 HISTORY — DX: Headache: R51

## 2017-07-11 HISTORY — DX: Headache, unspecified: R51.9

## 2017-07-11 HISTORY — PX: STERNAL WIRES REMOVAL: SHX2441

## 2017-07-11 HISTORY — DX: Anemia, unspecified: D64.9

## 2017-07-11 LAB — CBC
HEMATOCRIT: 35.8 % — AB (ref 36.0–46.0)
HEMOGLOBIN: 11.7 g/dL — AB (ref 12.0–15.0)
MCH: 28.7 pg (ref 26.0–34.0)
MCHC: 32.7 g/dL (ref 30.0–36.0)
MCV: 87.7 fL (ref 78.0–100.0)
Platelets: 224 10*3/uL (ref 150–400)
RBC: 4.08 MIL/uL (ref 3.87–5.11)
RDW: 14.1 % (ref 11.5–15.5)
WBC: 4.6 10*3/uL (ref 4.0–10.5)

## 2017-07-11 LAB — COMPREHENSIVE METABOLIC PANEL
ALBUMIN: 3.5 g/dL (ref 3.5–5.0)
ALK PHOS: 94 U/L (ref 38–126)
ALT: 12 U/L — AB (ref 14–54)
AST: 20 U/L (ref 15–41)
Anion gap: 5 (ref 5–15)
BILIRUBIN TOTAL: 0.4 mg/dL (ref 0.3–1.2)
BUN: 13 mg/dL (ref 6–20)
CHLORIDE: 110 mmol/L (ref 101–111)
CO2: 23 mmol/L (ref 22–32)
Calcium: 8.9 mg/dL (ref 8.9–10.3)
Creatinine, Ser: 0.85 mg/dL (ref 0.44–1.00)
GFR calc Af Amer: >60 mL/min (ref >60)
GFR calc non Af Amer: >60 mL/min (ref >60)
GLUCOSE: 94 mg/dL (ref 65–99)
Potassium: 4 mmol/L (ref 3.5–5.1)
SODIUM: 138 mmol/L (ref 135–145)
TOTAL PROTEIN: 7.3 g/dL (ref 6.5–8.1)

## 2017-07-11 SURGERY — REMOVAL, STERNAL WIRE
Anesthesia: General | Site: Chest

## 2017-07-11 MED ORDER — FENTANYL CITRATE (PF) 250 MCG/5ML IJ SOLN
INTRAMUSCULAR | Status: DC | PRN
  Administered 2017-07-11: 25 ug via INTRAVENOUS
  Administered 2017-07-11: 50 ug via INTRAVENOUS
  Administered 2017-07-11 (×3): 25 ug via INTRAVENOUS

## 2017-07-11 MED ORDER — PROPOFOL 10 MG/ML IV BOLUS
INTRAVENOUS | Status: AC
  Filled 2017-07-11: qty 20

## 2017-07-11 MED ORDER — ACETAMINOPHEN 325 MG PO TABS
650.0000 mg | ORAL_TABLET | Freq: Once | ORAL | Status: AC
  Administered 2017-07-11: 650 mg via ORAL
  Filled 2017-07-11: qty 2

## 2017-07-11 MED ORDER — LIDOCAINE HCL (CARDIAC) 20 MG/ML IV SOLN
INTRAVENOUS | Status: DC | PRN
  Administered 2017-07-11: 80 mg via INTRATRACHEAL

## 2017-07-11 MED ORDER — TRAMADOL HCL 50 MG PO TABS: 50.0000 mg | ORAL_TABLET | Freq: Four times a day (QID) | ORAL | 0 refills | Status: DC | PRN

## 2017-07-11 MED ORDER — PROMETHAZINE HCL 25 MG/ML IJ SOLN: 6.2500 mg | INTRAMUSCULAR | Status: DC | PRN

## 2017-07-11 MED ORDER — MIDAZOLAM HCL 2 MG/2ML IJ SOLN
INTRAMUSCULAR | Status: AC
  Filled 2017-07-11: qty 2

## 2017-07-11 MED ORDER — FENTANYL CITRATE (PF) 250 MCG/5ML IJ SOLN
INTRAMUSCULAR | Status: AC
  Filled 2017-07-11: qty 5

## 2017-07-11 MED ORDER — DEXAMETHASONE SODIUM PHOSPHATE 10 MG/ML IJ SOLN
INTRAMUSCULAR | Status: DC | PRN
  Administered 2017-07-11: 5 mg via INTRAVENOUS

## 2017-07-11 MED ORDER — HYDROMORPHONE HCL 1 MG/ML IJ SOLN
INTRAMUSCULAR | Status: AC
  Administered 2017-07-11: 0.5 mg via INTRAVENOUS
  Filled 2017-07-11: qty 1

## 2017-07-11 MED ORDER — ACETAMINOPHEN 325 MG PO TABS
ORAL_TABLET | ORAL | Status: AC
  Administered 2017-07-11: 650 mg via ORAL
  Filled 2017-07-11: qty 2

## 2017-07-11 MED ORDER — ONDANSETRON HCL 4 MG/2ML IJ SOLN
INTRAMUSCULAR | Status: DC | PRN
  Administered 2017-07-11: 4 mg via INTRAVENOUS

## 2017-07-11 MED ORDER — OXYCODONE HCL 5 MG PO TABS
10.0000 mg | ORAL_TABLET | Freq: Once | ORAL | Status: AC
  Administered 2017-07-11: 10 mg via ORAL

## 2017-07-11 MED ORDER — LACTATED RINGERS IV SOLN
INTRAVENOUS | Status: DC
  Administered 2017-07-11: 09:00:00 via INTRAVENOUS

## 2017-07-11 MED ORDER — SUGAMMADEX SODIUM 200 MG/2ML IV SOLN
INTRAVENOUS | Status: DC | PRN
  Administered 2017-07-11: 100 mg via INTRAVENOUS

## 2017-07-11 MED ORDER — 0.9 % SODIUM CHLORIDE (POUR BTL) OPTIME
TOPICAL | Status: DC | PRN
  Administered 2017-07-11: 1000 mL

## 2017-07-11 MED ORDER — PHENYLEPHRINE 40 MCG/ML (10ML) SYRINGE FOR IV PUSH (FOR BLOOD PRESSURE SUPPORT)
PREFILLED_SYRINGE | INTRAVENOUS | Status: DC | PRN
  Administered 2017-07-11 (×3): 80 ug via INTRAVENOUS

## 2017-07-11 MED ORDER — PROPOFOL 10 MG/ML IV BOLUS
INTRAVENOUS | Status: DC | PRN
Start: 2017-07-11 — End: 2017-07-11
  Administered 2017-07-11: 140 mg via INTRAVENOUS

## 2017-07-11 MED ORDER — MIDAZOLAM HCL 2 MG/2ML IJ SOLN
INTRAMUSCULAR | Status: DC | PRN
  Administered 2017-07-11: 1 mg via INTRAVENOUS

## 2017-07-11 MED ORDER — OXYCODONE HCL 5 MG PO TABS: 5.0000 mg | ORAL_TABLET | Freq: Four times a day (QID) | ORAL | 0 refills | Status: DC | PRN

## 2017-07-11 MED ORDER — DEXAMETHASONE SODIUM PHOSPHATE 10 MG/ML IJ SOLN
INTRAMUSCULAR | Status: AC
  Filled 2017-07-11: qty 1

## 2017-07-11 MED ORDER — CEFAZOLIN SODIUM-DEXTROSE 2-3 GM-%(50ML) IV SOLR
INTRAVENOUS | Status: DC | PRN
  Administered 2017-07-11: 2 g via INTRAVENOUS

## 2017-07-11 MED ORDER — ONDANSETRON HCL 4 MG/2ML IJ SOLN
INTRAMUSCULAR | Status: AC
  Filled 2017-07-11: qty 2

## 2017-07-11 MED ORDER — OXYCODONE HCL 5 MG PO TABS
ORAL_TABLET | ORAL | Status: AC
  Administered 2017-07-11: 10 mg via ORAL
  Filled 2017-07-11: qty 2

## 2017-07-11 MED ORDER — ROCURONIUM BROMIDE 100 MG/10ML IV SOLN
INTRAVENOUS | Status: DC | PRN
  Administered 2017-07-11: 35 mg via INTRAVENOUS

## 2017-07-11 MED ORDER — EPHEDRINE SULFATE-NACL 50-0.9 MG/10ML-% IV SOSY: PREFILLED_SYRINGE | INTRAVENOUS | Status: DC | PRN

## 2017-07-11 MED ORDER — LIDOCAINE 2% (20 MG/ML) 5 ML SYRINGE
INTRAMUSCULAR | Status: AC
  Filled 2017-07-11: qty 5

## 2017-07-11 MED ORDER — HYDROMORPHONE HCL 1 MG/ML IJ SOLN
0.2500 mg | INTRAMUSCULAR | Status: DC | PRN
  Administered 2017-07-11 (×2): 0.5 mg via INTRAVENOUS

## 2017-07-11 SURGICAL SUPPLY — 60 items
ADH SKN CLS APL DERMABOND .7 (GAUZE/BANDAGES/DRESSINGS) ×1
ATTRACTOMAT 16X20 MAGNETIC DRP (DRAPES) ×1 IMPLANT
BAG DECANTER FOR FLEXI CONT (MISCELLANEOUS) ×1 IMPLANT
BLADE SURG 10 STRL SS (BLADE) ×2 IMPLANT
BNDG GAUZE ELAST 4 BULKY (GAUZE/BANDAGES/DRESSINGS) IMPLANT
CANISTER SUCT 3000ML PPV (MISCELLANEOUS) ×2 IMPLANT
CATH THORACIC 28FR RT ANG (CATHETERS) IMPLANT
CATH THORACIC 36FR (CATHETERS) IMPLANT
CATH THORACIC 36FR RT ANG (CATHETERS) IMPLANT
CLIP VESOCCLUDE SM WIDE 24/CT (CLIP) IMPLANT
CONT SPEC 4OZ CLIKSEAL STRL BL (MISCELLANEOUS) IMPLANT
COVER SURGICAL LIGHT HANDLE (MISCELLANEOUS) ×4 IMPLANT
DERMABOND ADVANCED (GAUZE/BANDAGES/DRESSINGS) ×1
DERMABOND ADVANCED .7 DNX12 (GAUZE/BANDAGES/DRESSINGS) IMPLANT
DRAPE CHEST BREAST 15X10 FENES (DRAPES) ×1 IMPLANT
DRAPE LAPAROSCOPIC ABDOMINAL (DRAPES) ×1 IMPLANT
DRAPE SLUSH/WARMER DISC (DRAPES) IMPLANT
ELECT REM PT RETURN 9FT ADLT (ELECTROSURGICAL) ×2
ELECTRODE REM PT RTRN 9FT ADLT (ELECTROSURGICAL) ×1 IMPLANT
GAUZE SPONGE 4X4 12PLY STRL (GAUZE/BANDAGES/DRESSINGS) ×1 IMPLANT
GAUZE XEROFORM 5X9 LF (GAUZE/BANDAGES/DRESSINGS) IMPLANT
GLOVE BIO SURGEON STRL SZ 6.5 (GLOVE) ×1 IMPLANT
GLOVE BIOGEL PI IND STRL 6.5 (GLOVE) IMPLANT
GLOVE BIOGEL PI INDICATOR 6.5 (GLOVE) ×1
GLOVE EUDERMIC 7 POWDERFREE (GLOVE) ×2 IMPLANT
GOWN STRL REUS W/ TWL LRG LVL3 (GOWN DISPOSABLE) ×4 IMPLANT
GOWN STRL REUS W/ TWL XL LVL3 (GOWN DISPOSABLE) ×1 IMPLANT
GOWN STRL REUS W/TWL LRG LVL3 (GOWN DISPOSABLE) ×2
GOWN STRL REUS W/TWL XL LVL3 (GOWN DISPOSABLE) ×2
HANDPIECE INTERPULSE COAX TIP (DISPOSABLE)
HEMOSTAT POWDER SURGIFOAM 1G (HEMOSTASIS) ×2 IMPLANT
KIT BASIN OR (CUSTOM PROCEDURE TRAY) ×2 IMPLANT
KIT ROOM TURNOVER OR (KITS) ×2 IMPLANT
KIT SUCTION CATH 14FR (SUCTIONS) IMPLANT
MARKER SKIN DUAL TIP RULER LAB (MISCELLANEOUS) IMPLANT
NS IRRIG 1000ML POUR BTL (IV SOLUTION) ×2 IMPLANT
PACK CHEST (CUSTOM PROCEDURE TRAY) ×1 IMPLANT
PACK GENERAL/GYN (CUSTOM PROCEDURE TRAY) ×1 IMPLANT
PAD ARMBOARD 7.5X6 YLW CONV (MISCELLANEOUS) ×4 IMPLANT
PIN SAFETY STERILE (MISCELLANEOUS) IMPLANT
RUBBERBAND STERILE (MISCELLANEOUS) IMPLANT
SET HNDPC FAN SPRY TIP SCT (DISPOSABLE) IMPLANT
SOL PREP POV-IOD 4OZ 10% (MISCELLANEOUS) ×1 IMPLANT
SPONGE LAP 18X18 X RAY DECT (DISPOSABLE) ×1 IMPLANT
STRAP MONTGOMERY 1.25X11-1/8 (MISCELLANEOUS) IMPLANT
SUT STEEL 6MS V (SUTURE) IMPLANT
SUT STEEL STERNAL CCS#1 18IN (SUTURE) IMPLANT
SUT STEEL SZ 6 DBL 3X14 BALL (SUTURE) IMPLANT
SUT VIC AB 1 CTX 36 (SUTURE) ×2
SUT VIC AB 1 CTX36XBRD ANBCTR (SUTURE) IMPLANT
SUT VIC AB 2-0 CTX 36 (SUTURE) ×1 IMPLANT
SUT VIC AB 3-0 X1 27 (SUTURE) ×1 IMPLANT
SUT VIC AB 4-0 PS2 27 (SUTURE) ×1 IMPLANT
SWAB COLLECTION DEVICE MRSA (MISCELLANEOUS) IMPLANT
SWAB CULTURE ESWAB REG 1ML (MISCELLANEOUS) IMPLANT
SYSTEM SAHARA CHEST DRAIN ATS (WOUND CARE) ×1 IMPLANT
TOWEL OR 17X24 6PK STRL BLUE (TOWEL DISPOSABLE) ×2 IMPLANT
TOWEL OR 17X26 10 PK STRL BLUE (TOWEL DISPOSABLE) ×2 IMPLANT
TRAY FOLEY W/METER SILVER 14FR (SET/KITS/TRAYS/PACK) ×1 IMPLANT
WATER STERILE IRR 1000ML POUR (IV SOLUTION) ×2 IMPLANT

## 2017-07-11 NOTE — Discharge Instructions (Signed)
Incision is covered with Dermabond surgical adhesive that is waterproof. You may shower. Do not apply creams or lotions to incisions

## 2017-07-11 NOTE — Op Note (Signed)
CARDIOVASCULAR SURGERY OPERATIVE NOTE  07/11/2017 [name] [id number]  Surgeon:  Alleen Borne, MD  First Assistant: none   Preoperative Diagnosis:  Persistent chest wall pain s/p CABG   Postoperative Diagnosis:  Same   Procedure:  1. Removal of sternal wires   Anesthesia:  General Endotracheal   Clinical History/Surgical Indication:  The patient is a 51 year old woman who underwent CABG x 3 on 10/26/2016.he patient's early postoperative recovery while in the hospital was notable for an uncomplicated postop course.Postop she continued to complain oftingling and burning pain over the skin of the left chest wall in the distribution of the left IMA. Shewasunder a lot of emotional stress since her husband died shortly after her surgeryof end stage [facility] at [facility]. I last saw her in June And she was still having some discomfort. She tried Neurontin but it gave her nausea even in low doses. She continues to complain of pain along the incision as well as along the side of her sternum more so in the upper chest. She sounds like she still has some numbness and tinging along the left chest wall in the distribution of the IMA.  She has persistent discomfort along the sternotomy incision now 8 months postop. This is unusual and may be due to the sternal wires. She is a very anxious woman and emotional stress may be playing a part but I think the best option is to remove the sternal wires to see if that helps her pain. There is nothing else to offer her and in my experience most of the time this resolves the pain. I discussed the surgical procedure with her including the risks of infection, persistent pain and scarring and she understands and agrees to proceed.    Preparation:  The patient was seen in the preoperative holding area and the correct patient, correct operation were confirmed with the patient after reviewing the medical record. The consent was signed by me.  Preoperative antibiotics were given.  After being placed under general endotracheal anesthesia by the anesthesia team the neck, chest, and abdomen were prepped with betadine soap and solution and draped in the usual sterile manner. A surgical time-out was taken and the correct patient and operative procedure were confirmed with the nursing and anesthesia staff.   Removal of Sternal Wires:  The chest x-ray was reviewed and there are 7 sternal wires.  For 1 cm incisions were made along the old scar over where I could palpate a wire.  Through each of these incisions the subtenons tissue was divided using electrocautery.  The wires were located untwisted, cut, and removed in entirety.  I was able to remove all 7 wires.  There was good hemostasis.  Each incision was closed using a 3-0 Vicryl subcuticular skin closure.  Dermabond was applied over the incisions.  The sponge needle and instrument counts were correct according scrub nurse.  Patient was then awakened, extubated, and transported to the post anesthesia care unit in satisfactory and stable condition.

## 2017-07-11 NOTE — Transfer of Care (Signed)
Immediate Anesthesia Transfer of Care Note  Patient: Carolyn Gaines  Procedure(s) Performed: STERNAL WIRES REMOVAL (N/A Chest)  Patient Location: PACU  Anesthesia Type:General  Level of Consciousness: responds to stimulation; emergence delirium  Airway & Oxygen Therapy: Patient Spontanous Breathing and Patient connected to face mask oxygen  Post-op Assessment: Report given to RN, Post -op Vital signs reviewed and stable and Patient moving all extremities X 4  Post vital signs: Reviewed and stable  Last Vitals:  Vitals:   07/11/17 0828  BP: (!) 99/58  Pulse: 79  Resp: 20  Temp: 36.7 C  SpO2: 99%    Last Pain:  Vitals:   07/11/17 0828  TempSrc: Oral      Patients Stated Pain Goal: 7 (07/11/17 0859)  Complications: No apparent anesthesia complications

## 2017-07-11 NOTE — H&P (Signed)
[address]       [place] [address]             [id number]      Cardiothoracic Surgery History and Physical  Carolyn Gaines is an 51 y.o. female.   Chief Complaint: sternal pain HPI:   The patient is a 51 year old woman who underwent CABG x 3 on 10/26/2016. he patient's early postoperative recovery while in the hospital was notable for an uncomplicated postop course. Postop she continued to complain of tingling and burning pain over the skin of the left chest wall in the distribution of the left IMA. She was under a lot of emotional stress since her husband died shortly after her surgery of end stage COPD at [facility]. I last saw her in June  And she was still having some discomfort. She tried Neurontin but it gave her nausea even in low doses. She continues to complain of pain along the incision as well as along the side of her sternum more so in the upper chest. She sounds like she still has some numbness and tinging along the left chest wall in the distribution of the IMA.    Past Medical History:  Diagnosis Date  . Anemia    low iron  . Anxiety   . Arthritis   . Coronary artery disease   . Depression   . Fibromyalgia   . Gallstones   . GERD (gastroesophageal reflux disease)   . Headache    migraines  . MI (myocardial infarction) ([place]) 01/2016  . Peptic ulcer   . Seizures ([place])    as a baby    Past Surgical History:  Procedure Laterality Date  . ABDOMINAL HYSTERECTOMY    . CARDIAC CATHETERIZATION N/A 02/19/2016   Procedure: Left Heart Cath and Coronary Angiography;  Surgeon: Adrian Prows, MD;  Location: [place] [facility];  Service: Cardiovascular;  Laterality: N/A;  . CARDIAC CATHETERIZATION N/A 02/19/2016   Procedure: Coronary Stent Intervention;  Surgeon: Adrian Prows, MD;  Location: [place] [facility];  Service: Cardiovascular;  Laterality: N/A;  . CESAREAN SECTION     x 2  . CHOLECYSTECTOMY N/A 01/25/2017   Procedure: LAPAROSCOPIC  CHOLECYSTECTOMY;  Surgeon: Aviva Signs, MD;  Location: [facility];  Service: General;  Laterality: N/A;  . COLONOSCOPY    . CORONARY ARTERY BYPASS GRAFT N/A 10/26/2016   Procedure: CORONARY ARTERY BYPASS GRAFTING (CABG), ON PUMP, TIMES THREE, USING LEFT INTERNAL MAMMARY ARTERY AND ENDOSCOPICALLY HARVESTED RIGHT GREATER SAPHENOUS VEIN;  Surgeon: Gaye Pollack, MD;  Location: [facility];  Service: Open Heart Surgery;  Laterality: N/A;  LIMA-LAD SVG-RCA SVG-OM  . ESOPHAGOGASTRODUODENOSCOPY    . LEFT HEART CATH AND CORONARY ANGIOGRAPHY N/A 10/22/2016   Procedure: Left Heart Cath and Coronary Angiography;  Surgeon: Troy Sine, MD;  Location: [facility];  Service: Cardiovascular;  Laterality: N/A;  . TEE WITHOUT CARDIOVERSION N/A 10/26/2016   Procedure: TRANSESOPHAGEAL ECHOCARDIOGRAM (TEE);  Surgeon: Gaye Pollack, MD;  Location: [facility];  Service: Open Heart Surgery;  Laterality: N/A;  . WRIST FRACTURE SURGERY Left    3 times    Family History  Problem Relation Age of Onset  . COPD Mother   . Arthritis Mother   . Heart disease Mother   . Stroke Mother   . Psoriasis Father   . Heart disease Father   . Diabetes Maternal Grandmother   . Diabetes Maternal Aunt   .  Diabetes Maternal Aunt    Social History:  reports that she quit smoking about 6 years ago. she has never used smokeless tobacco. She reports that she drinks alcohol. She reports that she does not use drugs.  Allergies:  Allergies  Allergen Reactions  . Bee Venom Other (See Comments)    Pus sites at injection  . Adhesive [Tape] Itching and Swelling    Please use paper tape  . Triple Antibiotic [Bacitracin-Neomycin-Polymyxin] Rash    Facility-Administered Medications Prior to Admission  Medication Dose Route Frequency Provider Last Rate Last Dose  . 0.9 %  sodium chloride infusion  500 mL Intravenous Once Irene Shipper, MD       Medications Prior to Admission  Medication Sig Dispense Refill  . acetaminophen (TYLENOL)  500 MG tablet Take 1,000 mg by mouth every 6 (six) hours as needed for mild pain.    Marland Kitchen aspirin EC 81 MG EC tablet Take 1 tablet (81 mg total) by mouth daily. 30 tablet 11  . carvedilol (COREG) 3.125 MG tablet Take 1 tablet (3.125 mg total) by mouth 2 (two) times daily with a meal. 180 tablet 3  . escitalopram (LEXAPRO) 20 MG tablet Take 1 tablet (20 mg total) by mouth daily. 30 tablet 0  . pantoprazole (PROTONIX) 40 MG tablet Take 40 mg by mouth at bedtime.  12  . rosuvastatin (CRESTOR) 40 MG tablet Take 1 tablet (40 mg total) by mouth daily. 90 tablet 3  . traMADol (ULTRAM) 50 MG tablet Take 50 mg by mouth every 6 (six) hours as needed for severe pain.    . nitroGLYCERIN (NITROSTAT) 0.4 MG SL tablet PLACE 1 T UNDER THE TONGUE Q 5 MINUTES FOR 3 DOSES AS NEEDED FOR CHEST PAIN 25 tablet 3    Results for orders placed or performed during the hospital encounter of 07/11/17 (from the past 48 hour(s))  CBC     Status: Abnormal   Collection Time: 07/11/17  8:32 AM  Result Value Ref Range   WBC 4.6 4.0 - 10.5 K/uL   RBC 4.08 3.87 - 5.11 MIL/uL   Hemoglobin 11.7 (L) 12.0 - 15.0 g/dL   HCT 35.8 (L) 36.0 - 46.0 %   MCV 87.7 78.0 - 100.0 fL   MCH 28.7 26.0 - 34.0 pg   MCHC 32.7 30.0 - 36.0 g/dL   RDW 14.1 11.5 - 15.5 %   Platelets 224 150 - 400 K/uL  Comprehensive metabolic panel     Status: Abnormal   Collection Time: 07/11/17  8:32 AM  Result Value Ref Range   Sodium 138 135 - 145 mmol/L   Potassium 4.0 3.5 - 5.1 mmol/L   Chloride 110 101 - 111 mmol/L   CO2 23 22 - 32 mmol/L   Glucose, Bld 94 65 - 99 mg/dL   BUN 13 6 - 20 mg/dL   Creatinine, Ser 0.85 0.44 - 1.00 mg/dL   Calcium 8.9 8.9 - 10.3 mg/dL   Total Protein 7.3 6.5 - 8.1 g/dL   Albumin 3.5 3.5 - 5.0 g/dL   AST 20 15 - 41 U/L   ALT 12 (L) 14 - 54 U/L   Alkaline Phosphatase 94 38 - 126 U/L   Total Bilirubin 0.4 0.3 - 1.2 mg/dL   GFR calc non Af Amer >60 >60 mL/min   GFR calc Af Amer >60 >60 mL/min    Comment: (NOTE) The eGFR has  been calculated using the CKD EPI equation. This calculation has not been validated  in all clinical situations. eGFR's persistently <60 mL/min signify possible Chronic Kidney Disease.    Anion gap 5 5 - 15   Dg Chest 2 View  Result Date: 07/11/2017 CLINICAL DATA:  Preoperative sternal wire removal EXAM: CHEST  2 VIEW COMPARISON:  May 21, 2017 and Dec 22, 2016 FINDINGS: There is bibasilar lung scarring. There is no edema or consolidation. The heart size and pulmonary vascularity are normal. No adenopathy. Patient is status post median sternotomy with sternal wires appearing intact. Patient is status post coronary artery bypass grafting. No adenopathy. No bone lesions. IMPRESSION: Status post median sternotomy with sternal wires appearing intact. There is stable bibasilar scarring. No edema or consolidation. Stable cardiac silhouette. Electronically Signed   By: Lowella Grip III M.D.   On: 07/11/2017 09:05    Review of Systems  Constitutional: Negative.   HENT: Negative.   Eyes: Negative.   Respiratory: Negative.   Cardiovascular:       Chest wall pain along sternotomy  Gastrointestinal: Negative.   Genitourinary: Negative.   Musculoskeletal: Positive for myalgias and neck pain.  Skin: Negative.   Neurological: Positive for sensory change.  Endo/Heme/Allergies: Negative.   Psychiatric/Behavioral: Positive for depression. The patient is nervous/anxious.     Blood pressure (!) 99/58, pulse 79, temperature 98 F (36.7 C), temperature source Oral, resp. rate 20, weight 73.5 kg (162 lb), SpO2 99 %. Physical Exam  Constitutional: She is oriented to person, place, and time. She appears well-developed and well-nourished.  Cardiovascular: Normal rate, regular rhythm and normal heart sounds.  No murmur heard. Respiratory: Effort normal and breath sounds normal. No respiratory distress. She exhibits tenderness.  Neurological: She is alert and oriented to person, place, and time. She  has normal strength. No cranial nerve deficit or sensory deficit.     Assessment/Plan  She has persistent discomfort along the sternotomy incision now 8 months postop. This is unusual and may be due to the sternal wires. She is a very anxious woman and emotional stress may be playing a part but I think the best option is to remove the sternal wires to see if that helps her pain. There is nothing else to offer her and in my experience most of the time this resolves the pain. I discussed the surgical procedure with her including the risks of infection, persistent pain and scarring and she understands and agrees to proceed.      Gaye Pollack, MD 07/11/2017, 10:21 AM

## 2017-07-11 NOTE — Anesthesia Postprocedure Evaluation (Signed)
Anesthesia Post Note  Patient: Carolyn Gaines  Procedure(s) Performed: STERNAL WIRES REMOVAL (N/A Chest)     Patient location during evaluation: PACU Anesthesia Type: General Level of consciousness: awake and sedated Pain management: pain level controlled Vital Signs Assessment: post-procedure vital signs reviewed and stable Respiratory status: spontaneous breathing, nonlabored ventilation, respiratory function stable and patient connected to nasal cannula oxygen Cardiovascular status: blood pressure returned to baseline and stable Postop Assessment: no apparent nausea or vomiting Anesthetic complications: no    Last Vitals:  Vitals:   07/11/17 1217 07/11/17 1230  BP:  140/82  Pulse: 73 76  Resp: 11 16  Temp:    SpO2: (!) 87% 95%    Last Pain:  Vitals:   07/11/17 1230  TempSrc:   PainSc: Asleep                 Magdaleno Lortie,JAMES TERRILL

## 2017-07-11 NOTE — Brief Op Note (Signed)
07/11/2017  11:49 AM  PATIENT:  Carolyn Gaines  51 y.o. female  PRE-OPERATIVE DIAGNOSIS:  Persistent Chest Wall Pain  POST-OPERATIVE DIAGNOSIS:  Persistent Chest Wall Pain  PROCEDURE:  Procedure(s): STERNAL WIRES REMOVAL (N/A)  SURGEON:  Surgeon(s) and Role:    * Bartle, Payton Doughty, MD - Primary  PHYSICIAN ASSISTANT: none    ANESTHESIA:   general  EBL:  5 mL   BLOOD ADMINISTERED:none  DRAINS: none   LOCAL MEDICATIONS USED:  NONE  SPECIMEN:  No Specimen  DISPOSITION OF SPECIMEN:  N/A  COUNTS:  YES  TOURNIQUET:  * No tourniquets in log *  DICTATION: .Note written in EPIC  PLAN OF CARE: Discharge to home after PACU  PATIENT DISPOSITION:  PACU - hemodynamically stable.   Delay start of Pharmacological VTE agent (>24hrs) due to surgical blood loss or risk of bleeding: not applicable

## 2017-07-11 NOTE — Interval H&P Note (Signed)
History and Physical Interval Note:  07/11/2017 10:25 AM  Carolyn Gaines  has presented today for surgery, with the diagnosis of Persistent Chest Wall Pain  The various methods of treatment have been discussed with the patient and family. After consideration of risks, benefits and other options for treatment, the patient has consented to  Procedure(s): STERNAL WIRES REMOVAL (N/A) as a surgical intervention .  [name] history has been reviewed, patient examined, no change in status, stable for surgery.  I have reviewed the patient's chart and labs.  Questions were answered to the patient's satisfaction.     Alleen Borne

## 2017-07-11 NOTE — Anesthesia Procedure Notes (Signed)
Procedure Name: Intubation Date/Time: 07/11/2017 10:42 AM Performed by: Julieta Bellini, CRNA Pre-anesthesia Checklist: Patient identified, Emergency Drugs available, Suction available and Patient being monitored Patient Re-evaluated:Patient Re-evaluated prior to induction Oxygen Delivery Method: Circle system utilized Preoxygenation: Pre-oxygenation with 100% oxygen Induction Type: IV induction Ventilation: Mask ventilation without difficulty and Oral airway inserted - appropriate to patient size Laryngoscope Size: Mac and 3 Grade View: Grade I Tube type: Oral Tube size: 7.0 mm Number of attempts: 1 Airway Equipment and Method: Stylet Placement Confirmation: ETT inserted through vocal cords under direct vision,  positive ETCO2 and breath sounds checked- equal and bilateral Secured at: 22 cm Tube secured with: Tape Dental Injury: Teeth and Oropharynx as per pre-operative assessment

## 2017-07-11 NOTE — Anesthesia Preprocedure Evaluation (Addendum)
Anesthesia Evaluation  Patient identified by MRN, date of birth, ID band Patient awake    Reviewed: Allergy & Precautions, NPO status   Airway Mallampati: II  TM Distance: >3 FB Neck ROM: Full    Dental  (+) Edentulous Upper, Edentulous Lower   Pulmonary former smoker,    breath sounds clear to auscultation       Cardiovascular + angina + CAD, + Past MI and + CABG   Rhythm:Regular Rate:Normal     Neuro/Psych  Neuromuscular disease    GI/Hepatic PUD, GERD  ,  Endo/Other    Renal/GU      Musculoskeletal  (+) Fibromyalgia -  Abdominal   Peds  Hematology  (+) anemia ,   Anesthesia Other Findings   Reproductive/Obstetrics                            Anesthesia Physical Anesthesia Plan  ASA: III  Anesthesia Plan: General   Post-op Pain Management:    Induction: Intravenous  PONV Risk Score and Plan: 4 or greater and Treatment may vary due to age or medical condition, Dexamethasone and Ondansetron  Airway Management Planned: Oral ETT  Additional Equipment:   Intra-op Plan:   Post-operative Plan: Extubation in OR  Informed Consent: I have reviewed the patients History and Physical, chart, labs and discussed the procedure including the risks, benefits and alternatives for the proposed anesthesia with the patient or authorized representative who has indicated his/her understanding and acceptance.     Plan Discussed with: CRNA and Anesthesiologist  Anesthesia Plan Comments:        Anesthesia Quick Evaluation

## 2017-07-12 ENCOUNTER — Encounter (HOSPITAL_COMMUNITY): Payer: Self-pay | Admitting: Surgery

## 2017-07-31 ENCOUNTER — Ambulatory Visit
Admission: RE | Admit: 2017-07-31 | Discharge: 2017-07-31 | Disposition: A | Discharge status: Home / Self Care | Payer: BLUE CROSS/BLUE SHIELD | Source: Ambulatory Visit | Attending: Surgery | Admitting: Surgery

## 2017-07-31 ENCOUNTER — Other Ambulatory Visit: Payer: Self-pay

## 2017-07-31 ENCOUNTER — Encounter: Payer: Self-pay | Admitting: Surgery

## 2017-07-31 ENCOUNTER — Ambulatory Visit (INDEPENDENT_AMBULATORY_CARE_PROVIDER_SITE_OTHER): Payer: Self-pay | Admitting: Surgery

## 2017-07-31 ENCOUNTER — Other Ambulatory Visit: Payer: Self-pay | Admitting: Surgery

## 2017-07-31 VITALS — BP 104/71 | HR 73 | Resp 18 | Ht 63.0 in | Wt 162.0 lb

## 2017-07-31 DIAGNOSIS — Z951 Presence of aortocoronary bypass graft: Secondary | ICD-10-CM

## 2017-07-31 DIAGNOSIS — I2510 Atherosclerotic heart disease of native coronary artery without angina pectoris: Secondary | ICD-10-CM | POA: Diagnosis not present

## 2017-07-31 NOTE — Progress Notes (Signed)
    HPI: Patient returns for routine postoperative follow-up having undergone removal of her sternal wires on 07/11/2017. Since going home the patient reports that her previous chest wall pain and numbness have completely resolved. She says that she feels better than she has in years.   Current Outpatient Medications  Medication Sig Dispense Refill  . acetaminophen (TYLENOL) 500 MG tablet Take 1,000 mg by mouth every 6 (six) hours as needed for mild pain.    Marland Kitchen aspirin EC 81 MG EC tablet Take 1 tablet (81 mg total) by mouth daily. 30 tablet 11  . carvedilol (COREG) 3.125 MG tablet Take 1 tablet (3.125 mg total) by mouth 2 (two) times daily with a meal. 180 tablet 3  . escitalopram (LEXAPRO) 20 MG tablet Take 1 tablet (20 mg total) by mouth daily. 30 tablet 0  . nitroGLYCERIN (NITROSTAT) 0.4 MG SL tablet PLACE 1 T UNDER THE TONGUE Q 5 MINUTES FOR 3 DOSES AS NEEDED FOR CHEST PAIN 25 tablet 3  . pantoprazole (PROTONIX) 40 MG tablet Take 40 mg by mouth at bedtime.  12  . rosuvastatin (CRESTOR) 40 MG tablet Take 1 tablet (40 mg total) by mouth daily. 90 tablet 3  . traMADol (ULTRAM) 50 MG tablet Take 50 mg by mouth every 6 (six) hours as needed for severe pain.     Current Facility-Administered Medications  Medication Dose Route Frequency Provider Last Rate Last Dose  . 0.9 %  sodium chloride infusion  500 mL Intravenous Once Hilarie Fredrickson, MD        Physical Exam: BP 104/71 (BP Location: Right Arm, Patient Position: Sitting, Cuff Size: Large)   Pulse 73   Resp 18   Ht 5\' 3"  (1.6 m)   Wt 162 lb (73.5 kg)   SpO2 99% Comment: on RA  BMI 28.70 kg/m  The incisions are healing well. There are several Vicryl subcuticular suture knots that are protruding from the skin.  The sternum is stable.   Diagnostic Tests:  CXR today looks normal.  Impression:  She is doing well after removal of her sternal wires. Her previous chest wall pain and paresthesias have completely  resolved.  Plan:  She will follow up as needed. She will let the suture knots fall out as they dissolve.   Alleen Borne, MD [facility] [phone]

## 2017-08-07 ENCOUNTER — Emergency Department (HOSPITAL_COMMUNITY): Payer: BLUE CROSS/BLUE SHIELD

## 2017-08-07 ENCOUNTER — Encounter (HOSPITAL_COMMUNITY): Payer: Self-pay

## 2017-08-07 ENCOUNTER — Emergency Department (HOSPITAL_COMMUNITY)
Admission: EM | Admit: 2017-08-07 | Discharge: 2017-08-07 | Disposition: A | Discharge status: Home / Self Care | Payer: BLUE CROSS/BLUE SHIELD | Attending: Emergency Medicine | Admitting: Emergency Medicine

## 2017-08-07 DIAGNOSIS — I10 Essential (primary) hypertension: Secondary | ICD-10-CM | POA: Diagnosis not present

## 2017-08-07 DIAGNOSIS — Z87891 Personal history of nicotine dependence: Secondary | ICD-10-CM | POA: Diagnosis not present

## 2017-08-07 DIAGNOSIS — R079 Chest pain, unspecified: Secondary | ICD-10-CM | POA: Diagnosis not present

## 2017-08-07 DIAGNOSIS — J209 Acute bronchitis, unspecified: Secondary | ICD-10-CM | POA: Diagnosis not present

## 2017-08-07 DIAGNOSIS — R062 Wheezing: Secondary | ICD-10-CM | POA: Diagnosis not present

## 2017-08-07 DIAGNOSIS — I252 Old myocardial infarction: Secondary | ICD-10-CM | POA: Insufficient documentation

## 2017-08-07 DIAGNOSIS — Z79899 Other long term (current) drug therapy: Secondary | ICD-10-CM | POA: Diagnosis not present

## 2017-08-07 DIAGNOSIS — I2510 Atherosclerotic heart disease of native coronary artery without angina pectoris: Secondary | ICD-10-CM | POA: Insufficient documentation

## 2017-08-07 DIAGNOSIS — R05 Cough: Secondary | ICD-10-CM | POA: Diagnosis not present

## 2017-08-07 DIAGNOSIS — Z7982 Long term (current) use of aspirin: Secondary | ICD-10-CM | POA: Diagnosis not present

## 2017-08-07 DIAGNOSIS — R0602 Shortness of breath: Secondary | ICD-10-CM | POA: Diagnosis not present

## 2017-08-07 MED ORDER — ALBUTEROL SULFATE HFA 108 (90 BASE) MCG/ACT IN AERS
2.0000 | INHALATION_SPRAY | Freq: Once | RESPIRATORY_TRACT | Status: AC
  Administered 2017-08-07: 2 via RESPIRATORY_TRACT
  Filled 2017-08-07: qty 6.7

## 2017-08-07 MED ORDER — HYDROCOD POLST-CPM POLST ER 10-8 MG/5ML PO SUER: 5.0000 mL | Freq: Two times a day (BID) | ORAL | 0 refills | Status: DC | PRN

## 2017-08-07 MED ORDER — AZITHROMYCIN 250 MG PO TABS: ORAL_TABLET | ORAL | 0 refills | Status: DC

## 2017-08-07 NOTE — ED Triage Notes (Addendum)
Pt reports that she has had cold symptoms for 2 weeks, now she is coughing up yellow mucous for 3 days now. Feels SOB with exertion. Cough is worse in the morning and at night. Pt reports rib pain and chest pain from coughing. Using Mucinex DM and tylenol. Recently had wires removed from triple bypass

## 2017-08-07 NOTE — ED Provider Notes (Signed)
[facility] EMERGENCY DEPARTMENT Provider Note   CSN: [id number] Arrival date & time: 08/07/17  1346     History   Chief Complaint Chief Complaint  Patient presents with  . Cough    HPI Carolyn Gaines is a 52 y.o. female with a 2 week history of  uri type symptoms which includes nasal congestion with yellow rhinorrhea, and now productive cough x 3 days of yellow thick sputum production. .  Symptoms do not include nausea, vomiting or diarrhea and has had no fevers.  She endorses localizing pain to her right lateral rib cage which is worsened with cough and reports sob with exertion and in the am when her post nasal drip and cough is at its worst. She denies dizziness, orthopnea, swelling or pain in extremities. The patient has taken mucinex DM and tylenol prior to arrival with no significant improvement in symptoms. Her history is significant for CABG procedure 3/18 with removal of sternal wires 1 months ago due to constant midsternal pain which has improved. .  The history is provided by the patient.    Past Medical History:  Diagnosis Date  . Anemia    low iron  . Anxiety   . Arthritis   . Coronary artery disease   . Depression   . Fibromyalgia   . Gallstones   . GERD (gastroesophageal reflux disease)   . Headache    migraines  . MI (myocardial infarction) (HCC) 01/2016  . Peptic ulcer   . Seizures (HCC)    as a baby    Patient Active Problem List   Diagnosis Date Noted  . Calculus of gallbladder without cholecystitis without obstruction   . 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 Unstable angina (HCC)   . Fibromyalgia 10/20/2016  . Essential hypertension 07/18/2016  . Psoriasis 07/18/2016  . Precordial chest pain 07/03/2016  . Depression with anxiety 07/03/2016  . Normocytic anemia 07/03/2016  . History of acute inferior wall MI 02/19/2016  . Coronary artery disease involving native heart without angina pectoris 02/19/2016    Past Surgical History:  Procedure Laterality  Date  . ABDOMINAL HYSTERECTOMY    . CARDIAC CATHETERIZATION N/A 02/19/2016   Procedure: Left Heart Cath and Coronary Angiography;  Surgeon: Yates Decamp, MD;  Location: [facility] INVASIVE CV LAB;  Service: Cardiovascular;  Laterality: N/A;  . CARDIAC CATHETERIZATION N/A 02/19/2016   Procedure: Coronary Stent Intervention;  Surgeon: Yates Decamp, MD;  Location: [facility] INVASIVE CV LAB;  Service: Cardiovascular;  Laterality: N/A;  . CESAREAN SECTION     x 2  . CHOLECYSTECTOMY N/A 01/25/2017   Procedure: LAPAROSCOPIC CHOLECYSTECTOMY;  Surgeon: Franky Macho, MD;  Location: [facility] ORS;  Service: General;  Laterality: N/A;  . COLONOSCOPY    . CORONARY ARTERY BYPASS GRAFT N/A 10/26/2016   Procedure: CORONARY ARTERY BYPASS GRAFTING (CABG), ON PUMP, TIMES THREE, USING LEFT INTERNAL MAMMARY ARTERY AND ENDOSCOPICALLY HARVESTED RIGHT GREATER SAPHENOUS VEIN;  Surgeon: Alleen Borne, MD;  Location: [facility] OR;  Service: Open Heart Surgery;  Laterality: N/A;  LIMA-LAD SVG-RCA SVG-OM  . ESOPHAGOGASTRODUODENOSCOPY    . LEFT HEART CATH AND CORONARY ANGIOGRAPHY N/A 10/22/2016   Procedure: Left Heart Cath and Coronary Angiography;  Surgeon: Lennette Bihari, MD;  Location: [facility] INVASIVE CV LAB;  Service: Cardiovascular;  Laterality: N/A;  . STERNAL WIRES REMOVAL N/A 07/11/2017   Procedure: STERNAL WIRES REMOVAL;  Surgeon: Alleen Borne, MD;  Location: [facility] OR;  Service: Thoracic;  Laterality: N/A;  . TEE WITHOUT CARDIOVERSION N/A  10/26/2016   Procedure: TRANSESOPHAGEAL ECHOCARDIOGRAM (TEE);  Surgeon: Alleen Borne, MD;  Location: [facility] OR;  Service: Open Heart Surgery;  Laterality: N/A;  . WRIST FRACTURE SURGERY Left    3 times    OB History    No data available       Home Medications    Prior to Admission medications   Medication Sig Start Date End Date Taking? Authorizing Provider  acetaminophen (TYLENOL) 500 MG tablet Take 1,000 mg by mouth every 6 (six) hours as needed for mild pain.    [provider]  aspirin EC 81 MG EC  tablet Take 1 tablet (81 mg total) by mouth daily. 02/20/16   Marcy Salvo, NP  azithromycin (ZITHROMAX Z-PAK) 250 MG tablet Take 2 tablets by mouth on day one followed by one tablet daily for 4 days. 08/07/17   Burgess Amor, PA-C  carvedilol (COREG) 3.125 MG tablet Take 1 tablet (3.125 mg total) by mouth 2 (two) times daily with a meal. 03/20/17   Branch, Dorothe Pea, MD  chlorpheniramine-HYDROcodone (TUSSIONEX PENNKINETIC ER) 10-8 MG/5ML SUER Take 5 mLs by mouth every 12 (twelve) hours as needed for cough. 08/07/17   Burgess Amor, PA-C  escitalopram (LEXAPRO) 20 MG tablet Take 1 tablet (20 mg total) by mouth daily. 11/26/16   Jodelle Gross, NP  nitroGLYCERIN (NITROSTAT) 0.4 MG SL tablet PLACE 1 T UNDER THE TONGUE Q 5 MINUTES FOR 3 DOSES AS NEEDED FOR CHEST PAIN 03/14/17   [name], MD  pantoprazole (PROTONIX) 40 MG tablet Take 40 mg by mouth at bedtime. 03/09/17   [provider]  rosuvastatin (CRESTOR) 40 MG tablet Take 1 tablet (40 mg total) by mouth daily. 03/19/17 07/04/17  [name], MD  traMADol (ULTRAM) 50 MG tablet Take 50 mg by mouth every 6 (six) hours as needed for severe pain.    [provider]    Family History Family History  Problem Relation Age of Onset  . COPD Mother   . Arthritis Mother   . Heart disease Mother   . Stroke Mother   . Psoriasis Father   . Heart disease Father   . Diabetes Maternal Grandmother   . Diabetes Maternal Aunt   . Diabetes Maternal Aunt     Social History Social History   Tobacco Use  . Smoking status: Former Smoker    Last attempt to quit: 03/21/2011    Years since quitting: 6.3  . Smokeless tobacco: Never Used  Substance Use Topics  . Alcohol use: Yes    Comment: rare  . Drug use: No     Allergies   Bee venom; Adhesive [tape]; and Triple antibiotic [bacitracin-neomycin-polymyxin]   Review of Systems Review of Systems  Constitutional: Negative for chills and fever.  HENT: Positive for  congestion, postnasal drip, rhinorrhea and sore throat. Negative for ear pain, sinus pressure, sinus pain, trouble swallowing and voice change.   Eyes: Negative for discharge.  Respiratory: Positive for cough. Negative for shortness of breath, wheezing and stridor.   Cardiovascular: Negative for chest pain.  Gastrointestinal: Negative for abdominal pain.  Genitourinary: Negative.      Physical Exam Updated Vital Signs BP 119/72   Pulse 72   Temp 98 F (36.7 C) (Oral)   Resp 18   SpO2 97%   Physical Exam  Constitutional: She is oriented to person, place, and time. She appears well-developed and well-nourished.  HENT:  Head: Normocephalic and atraumatic.  Right Ear: Tympanic membrane and  ear canal normal.  Left Ear: Tympanic membrane and ear canal normal.  Nose: Mucosal edema present. No rhinorrhea.  Mouth/Throat: Uvula is midline and mucous membranes are normal. Posterior oropharyngeal erythema present. No oropharyngeal exudate, posterior oropharyngeal edema or tonsillar abscesses.  Mild posterior pharyngeal erythema. Yellow PND present.  Eyes: Conjunctivae are normal.  Cardiovascular: Normal rate and normal heart sounds.  Pulmonary/Chest: Effort normal. No respiratory distress. She has no wheezes. She has no rales. She exhibits tenderness.  Reproducible chest pain right lower anterior rib cage.  There is no crepitus.    Abdominal: Soft. There is no tenderness.  Musculoskeletal: Normal range of motion. She exhibits no edema.  Neurological: She is alert and oriented to person, place, and time.  Skin: Skin is warm and dry. No rash noted.  Psychiatric: She has a normal mood and affect.     ED Treatments / Results  Labs (all labs ordered are listed, but only abnormal results are displayed) Labs Reviewed - No data to display  EKG  EKG Interpretation None       Radiology Dg Chest 2 View  Result Date: 08/07/2017 CLINICAL DATA:  Shortness of breath.  Right chest pain.  EXAM: CHEST  2 VIEW COMPARISON:  07/31/2017.  07/11/2017. FINDINGS: Previous CABG. Heart size is normal. Mediastinal shadows are otherwise normal. Chronic linear scarring at both lung bases. No sign of heart failure, infiltrate, active collapse or effusion. No acute bone finding. IMPRESSION: No active process.  Previous CABG.  Linear scarring both lung bases. Electronically Signed   By: Paulina Fusi M.D.   On: 08/07/2017 15:12    Procedures Procedures (including critical care time)  Medications Ordered in ED Medications  albuterol (PROVENTIL HFA;VENTOLIN HFA) 108 (90 Base) MCG/ACT inhaler 2 puff (2 puffs Inhalation Given 08/07/17 1716)     Initial Impression / Assessment and Plan / ED Course  I have reviewed the triage vital signs and the nursing notes.  Pertinent labs & imaging results that were available during my care of the patient were reviewed by me and considered in my medical decision making (see chart for details).     Pt with sx of acute bronchitis. tx with zpack, tussionex, albuterol mdi, no wheeze currently on exam, present per history.  She was given an albuterol MDI with a spacer and instructed in its use.  Imaging reviewed and discussed with patient.  She has no symptoms or exam findings to suggest CHF, doubt PE.  PRN follow-up anticipated with her PCP, returning here for any worsening symptoms.  Final Clinical Impressions(s) / ED Diagnoses   Final diagnoses:  Acute bronchitis, unspecified organism  Wheezing    ED Discharge Orders        Ordered    azithromycin (ZITHROMAX Z-PAK) 250 MG tablet     08/07/17 1646    chlorpheniramine-HYDROcodone (TUSSIONEX PENNKINETIC ER) 10-8 MG/5ML SUER  Every 12 hours PRN     08/07/17 1646       Burgess Amor, PA-C 08/08/17 9604    Bethann Berkshire, MD 08/08/17 1555

## 2017-08-07 NOTE — Discharge Instructions (Signed)
Do not drive within 4 hours of taking your Tussionex cough syrup as this medication will make you drowsy.  Take your entire course of the antibiotic.  Use the inhaler taking 2 puffs every 4 hours using the spacer if you are coughing or wheezing.  Follow-up with your doctor for recheck of your symptoms if this does not improve your symptoms.

## 2017-09-23 ENCOUNTER — Encounter (HOSPITAL_COMMUNITY): Payer: Self-pay | Admitting: Emergency Medicine

## 2017-09-23 ENCOUNTER — Other Ambulatory Visit: Payer: Self-pay

## 2017-09-23 ENCOUNTER — Emergency Department (HOSPITAL_COMMUNITY): Payer: BLUE CROSS/BLUE SHIELD

## 2017-09-23 ENCOUNTER — Emergency Department (HOSPITAL_COMMUNITY)
Admission: EM | Admit: 2017-09-23 | Discharge: 2017-09-23 | Disposition: A | Discharge status: Home / Self Care | Payer: BLUE CROSS/BLUE SHIELD | Attending: Emergency Medicine | Admitting: Emergency Medicine

## 2017-09-23 DIAGNOSIS — I10 Essential (primary) hypertension: Secondary | ICD-10-CM | POA: Insufficient documentation

## 2017-09-23 DIAGNOSIS — Z9049 Acquired absence of other specified parts of digestive tract: Secondary | ICD-10-CM | POA: Diagnosis not present

## 2017-09-23 DIAGNOSIS — F329 Major depressive disorder, single episode, unspecified: Secondary | ICD-10-CM | POA: Insufficient documentation

## 2017-09-23 DIAGNOSIS — R509 Fever, unspecified: Secondary | ICD-10-CM | POA: Diagnosis not present

## 2017-09-23 DIAGNOSIS — Z951 Presence of aortocoronary bypass graft: Secondary | ICD-10-CM | POA: Diagnosis not present

## 2017-09-23 DIAGNOSIS — I2510 Atherosclerotic heart disease of native coronary artery without angina pectoris: Secondary | ICD-10-CM | POA: Diagnosis not present

## 2017-09-23 DIAGNOSIS — R69 Illness, unspecified: Secondary | ICD-10-CM

## 2017-09-23 DIAGNOSIS — Z87891 Personal history of nicotine dependence: Secondary | ICD-10-CM | POA: Insufficient documentation

## 2017-09-23 DIAGNOSIS — I252 Old myocardial infarction: Secondary | ICD-10-CM | POA: Insufficient documentation

## 2017-09-23 DIAGNOSIS — R079 Chest pain, unspecified: Secondary | ICD-10-CM | POA: Diagnosis not present

## 2017-09-23 DIAGNOSIS — R0602 Shortness of breath: Secondary | ICD-10-CM | POA: Diagnosis not present

## 2017-09-23 DIAGNOSIS — J111 Influenza due to unidentified influenza virus with other respiratory manifestations: Secondary | ICD-10-CM | POA: Diagnosis not present

## 2017-09-23 DIAGNOSIS — Z7982 Long term (current) use of aspirin: Secondary | ICD-10-CM | POA: Diagnosis not present

## 2017-09-23 DIAGNOSIS — Z79899 Other long term (current) drug therapy: Secondary | ICD-10-CM | POA: Diagnosis not present

## 2017-09-23 DIAGNOSIS — F419 Anxiety disorder, unspecified: Secondary | ICD-10-CM | POA: Diagnosis not present

## 2017-09-23 DIAGNOSIS — R05 Cough: Secondary | ICD-10-CM | POA: Diagnosis not present

## 2017-09-23 LAB — CBC
HCT: 42 % (ref 36.0–46.0)
Hemoglobin: 13.1 g/dL (ref 12.0–15.0)
MCH: 27.6 pg (ref 26.0–34.0)
MCHC: 31.2 g/dL (ref 30.0–36.0)
MCV: 88.4 fL (ref 78.0–100.0)
PLATELETS: 200 10*3/uL (ref 150–400)
RBC: 4.75 MIL/uL (ref 3.87–5.11)
RDW: 14.3 % (ref 11.5–15.5)
WBC: 3.1 10*3/uL — ABNORMAL LOW (ref 4.0–10.5)

## 2017-09-23 LAB — BASIC METABOLIC PANEL
Anion gap: 9 (ref 5–15)
BUN: 10 mg/dL (ref 6–20)
CALCIUM: 8.9 mg/dL (ref 8.9–10.3)
CO2: 21 mmol/L — AB (ref 22–32)
CREATININE: 0.97 mg/dL (ref 0.44–1.00)
Chloride: 107 mmol/L (ref 101–111)
GFR calc Af Amer: >60 mL/min (ref >60)
GFR calc non Af Amer: >60 mL/min (ref >60)
GLUCOSE: 96 mg/dL (ref 65–99)
Potassium: 3.7 mmol/L (ref 3.5–5.1)
Sodium: 137 mmol/L (ref 135–145)

## 2017-09-23 LAB — TROPONIN I

## 2017-09-23 MED ORDER — BENZONATATE 100 MG PO CAPS
100.0000 mg | ORAL_CAPSULE | Freq: Once | ORAL | Status: AC
  Administered 2017-09-23: 100 mg via ORAL
  Filled 2017-09-23: qty 1

## 2017-09-23 MED ORDER — ACETAMINOPHEN 500 MG PO TABS
1000.0000 mg | ORAL_TABLET | Freq: Once | ORAL | Status: AC
  Administered 2017-09-23: 1000 mg via ORAL
  Filled 2017-09-23: qty 2

## 2017-09-23 MED ORDER — BENZONATATE 100 MG PO CAPS: 100.0000 mg | ORAL_CAPSULE | Freq: Three times a day (TID) | ORAL | 0 refills | Status: DC | PRN

## 2017-09-23 NOTE — ED Provider Notes (Addendum)
[facility] EMERGENCY DEPARTMENT Provider Note   CSN: [id number] Arrival date & time: 09/23/17  1150     History   Chief Complaint Chief Complaint  Patient presents with  . Chest Pain    HPI Carolyn Gaines is a 52 y.o. female.  Chest pain worse with coughing onset 3 days ago.  Maximum temperature 102 degrees.  Treated with Tylenol with partial relief.  Other associated symptoms include sore throat and diffuse body aches.  She is been treated with a much Tylenol with partial relief.  Dr. Juanetta Gosling also phoned in amoxicillin which she is taken since 09/20/2017 without relief.  Associated symptoms include diffuse myalgias.  No abdominal pain.  No other associated symptoms  HPI  Past Medical History:  Diagnosis Date  . Anemia    low iron  . Anxiety   . Arthritis   . Coronary artery disease   . Depression   . Fibromyalgia   . Gallstones   . GERD (gastroesophageal reflux disease)   . Headache    migraines  . MI (myocardial infarction) (HCC) 01/2016  . Peptic ulcer   . Seizures (HCC)    as a baby    Patient Active Problem List   Diagnosis Date Noted  . Calculus of gallbladder without cholecystitis without obstruction   . 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 Unstable angina (HCC)   . Fibromyalgia 10/20/2016  . Essential hypertension 07/18/2016  . Psoriasis 07/18/2016  . Precordial chest pain 07/03/2016  . Depression with anxiety 07/03/2016  . Normocytic anemia 07/03/2016  . History of acute inferior wall MI 02/19/2016  . Coronary artery disease involving native heart without angina pectoris 02/19/2016    Past Surgical History:  Procedure Laterality Date  . ABDOMINAL HYSTERECTOMY    . CARDIAC CATHETERIZATION N/A 02/19/2016   Procedure: Left Heart Cath and Coronary Angiography;  Surgeon: Yates Decamp, MD;  Location: [facility] INVASIVE CV LAB;  Service: Cardiovascular;  Laterality: N/A;  . CARDIAC CATHETERIZATION N/A 02/19/2016   Procedure: Coronary Stent Intervention;  Surgeon: Yates Decamp,  MD;  Location: [facility] INVASIVE CV LAB;  Service: Cardiovascular;  Laterality: N/A;  . CESAREAN SECTION     x 2  . CHOLECYSTECTOMY N/A 01/25/2017   Procedure: LAPAROSCOPIC CHOLECYSTECTOMY;  Surgeon: Franky Macho, MD;  Location: [facility] ORS;  Service: General;  Laterality: N/A;  . COLONOSCOPY    . CORONARY ARTERY BYPASS GRAFT N/A 10/26/2016   Procedure: CORONARY ARTERY BYPASS GRAFTING (CABG), ON PUMP, TIMES THREE, USING LEFT INTERNAL MAMMARY ARTERY AND ENDOSCOPICALLY HARVESTED RIGHT GREATER SAPHENOUS VEIN;  Surgeon: Alleen Borne, MD;  Location: [facility] OR;  Service: Open Heart Surgery;  Laterality: N/A;  LIMA-LAD SVG-RCA SVG-OM  . ESOPHAGOGASTRODUODENOSCOPY    . LEFT HEART CATH AND CORONARY ANGIOGRAPHY N/A 10/22/2016   Procedure: Left Heart Cath and Coronary Angiography;  Surgeon: Lennette Bihari, MD;  Location: [facility] INVASIVE CV LAB;  Service: Cardiovascular;  Laterality: N/A;  . STERNAL WIRES REMOVAL N/A 07/11/2017   Procedure: STERNAL WIRES REMOVAL;  Surgeon: Alleen Borne, MD;  Location: [facility] OR;  Service: Thoracic;  Laterality: N/A;  . TEE WITHOUT CARDIOVERSION N/A 10/26/2016   Procedure: TRANSESOPHAGEAL ECHOCARDIOGRAM (TEE);  Surgeon: Alleen Borne, MD;  Location: [facility] OR;  Service: Open Heart Surgery;  Laterality: N/A;  . WRIST FRACTURE SURGERY Left    3 times    OB History    No data available       Home Medications    Prior to Admission medications  Medication Sig Start Date End Date Taking? Authorizing Provider  acetaminophen (TYLENOL) 500 MG tablet Take 1,000 mg by mouth every 6 (six) hours as needed for mild pain.    [provider]  aspirin EC 81 MG EC tablet Take 1 tablet (81 mg total) by mouth daily. 02/20/16   Marcy Salvo, NP  azithromycin (ZITHROMAX Z-PAK) 250 MG tablet Take 2 tablets by mouth on day one followed by one tablet daily for 4 days. 08/07/17   Burgess Amor, PA-C  carvedilol (COREG) 3.125 MG tablet Take 1 tablet (3.125 mg total) by mouth 2 (two) times daily with a  meal. 03/20/17   Branch, Dorothe Pea, MD  chlorpheniramine-HYDROcodone (TUSSIONEX PENNKINETIC ER) 10-8 MG/5ML SUER Take 5 mLs by mouth every 12 (twelve) hours as needed for cough. 08/07/17   Burgess Amor, PA-C  escitalopram (LEXAPRO) 20 MG tablet Take 1 tablet (20 mg total) by mouth daily. 11/26/16   Jodelle Gross, NP  nitroGLYCERIN (NITROSTAT) 0.4 MG SL tablet PLACE 1 T UNDER THE TONGUE Q 5 MINUTES FOR 3 DOSES AS NEEDED FOR CHEST PAIN 03/14/17   [name], MD  pantoprazole (PROTONIX) 40 MG tablet Take 40 mg by mouth at bedtime. 03/09/17   [provider]  rosuvastatin (CRESTOR) 40 MG tablet Take 1 tablet (40 mg total) by mouth daily. 03/19/17 07/04/17  [name], MD  traMADol (ULTRAM) 50 MG tablet Take 50 mg by mouth every 6 (six) hours as needed for severe pain.    [provider]    Family History Family History  Problem Relation Age of Onset  . COPD Mother   . Arthritis Mother   . Heart disease Mother   . Stroke Mother   . Psoriasis Father   . Heart disease Father   . Diabetes Maternal Grandmother   . Diabetes Maternal Aunt   . Diabetes Maternal Aunt     Social History Social History   Tobacco Use  . Smoking status: Former Smoker    Last attempt to quit: 03/21/2011    Years since quitting: 6.5  . Smokeless tobacco: Never Used  Substance Use Topics  . Alcohol use: Yes    Comment: rare  . Drug use: No     Allergies   Bee venom; Adhesive [tape]; and Triple antibiotic [bacitracin-neomycin-polymyxin]   Review of Systems Review of Systems  Constitutional: Positive for fever.  HENT: Positive for sore throat.   Respiratory: Positive for cough. Negative for shortness of breath.   Cardiovascular: Negative.   Gastrointestinal: Negative.   Musculoskeletal: Positive for myalgias.  Skin: Negative.   Neurological: Negative.   Psychiatric/Behavioral: Negative.   All other systems reviewed and are negative.    Physical Exam Updated Vital  Signs BP 111/71 (BP Location: Right Arm)   Pulse 92   Temp 98.6 F (37 C) (Oral)   Resp 16   Ht 5\' 3"  (1.6 m)   Wt 72.6 kg (160 lb)   SpO2 98%   BMI 28.34 kg/m   Physical Exam  Constitutional: She appears well-developed and well-nourished.  Appears mildly uncomfortable.  Nontoxic alert  HENT:  Head: Normocephalic and atraumatic.  Oropharynx reddened.  No tonsillar enlargement.  Uvula midline.  Eyes: Conjunctivae are normal. Pupils are equal, round, and reactive to light.  Neck: Neck supple. No tracheal deviation present. No thyromegaly present.  Cardiovascular: Normal rate and regular rhythm.  No murmur heard. Pulmonary/Chest: Effort normal and breath sounds normal.  Abdominal: Soft. Bowel sounds are normal. She  exhibits no distension. There is no tenderness.  Musculoskeletal: Normal range of motion. She exhibits no edema or tenderness.  Neurological: She is alert. Coordination normal.  Skin: Skin is warm and dry. No rash noted.  Psychiatric: She has a normal mood and affect.  Nursing note and vitals reviewed.    ED Treatments / Results  Labs (all labs ordered are listed, but only abnormal results are displayed) Labs Reviewed  BASIC METABOLIC PANEL - Abnormal; Notable for the following components:      Result Value   CO2 21 (*)    All other components within normal limits  CBC - Abnormal; Notable for the following components:   WBC 3.1 (*)    All other components within normal limits  TROPONIN I    EKG  EKG Interpretation  Date/Time:  Monday September 23 2017 12:01:22 EST Ventricular Rate:  94 PR Interval:  124 QRS Duration: 64 QT Interval:  324 QTC Calculation: 405 R Axis:   82 Text Interpretation:  Normal sinus rhythm Possible Left atrial enlargement Borderline ECG No significant change since last tracing Confirmed by Doug Sou [phone]) on 09/23/2017 5:44:58 PM       Radiology Dg Chest 2 View  Result Date: 09/23/2017 CLINICAL DATA:  Chest pain and  shortness of breath EXAM: CHEST  2 VIEW COMPARISON:  None. FINDINGS: There is mild scarring in the bases. There is no edema or consolidation. The heart size and pulmonary vascularity are normal. No adenopathy. Patient is status post coronary artery bypass grafting. IMPRESSION: No edema or consolidation. Scarring bilaterally in the base regions. Heart size normal. Electronically Signed   By: Bretta Bang III M.D.   On: 09/23/2017 12:33    Procedures Procedures (including critical care time)  Medications Ordered in ED Medications - No data to display Chest x-ray viewed by me. Results for orders placed or performed during the hospital encounter of 09/23/17  Basic metabolic panel  Result Value Ref Range   Sodium 137 135 - 145 mmol/L   Potassium 3.7 3.5 - 5.1 mmol/L   Chloride 107 101 - 111 mmol/L   CO2 21 (L) 22 - 32 mmol/L   Glucose, Bld 96 65 - 99 mg/dL   BUN 10 6 - 20 mg/dL   Creatinine, Ser 9.62 0.44 - 1.00 mg/dL   Calcium 8.9 8.9 - 95.2 mg/dL   GFR calc non Af Amer >60 >60 mL/min   GFR calc Af Amer >60 >60 mL/min   Anion gap 9 5 - 15  CBC  Result Value Ref Range   WBC 3.1 (L) 4.0 - 10.5 K/uL   RBC 4.75 3.87 - 5.11 MIL/uL   Hemoglobin 13.1 12.0 - 15.0 g/dL   HCT 84.1 32.4 - 40.1 %   MCV 88.4 78.0 - 100.0 fL   MCH 27.6 26.0 - 34.0 pg   MCHC 31.2 30.0 - 36.0 g/dL   RDW 02.7 25.3 - 66.4 %   Platelets 200 150 - 400 K/uL  Troponin I  Result Value Ref Range   Troponin I <0.03 <0.03 ng/mL   Dg Chest 2 View  Result Date: 09/23/2017 CLINICAL DATA:  Chest pain and shortness of breath EXAM: CHEST  2 VIEW COMPARISON:  None. FINDINGS: There is mild scarring in the bases. There is no edema or consolidation. The heart size and pulmonary vascularity are normal. No adenopathy. Patient is status post coronary artery bypass grafting. IMPRESSION: No edema or consolidation. Scarring bilaterally in the base regions. Heart size normal. Electronically  Signed   By: Bretta Bang III M.D.    On: 09/23/2017 12:33   Initial Impression / Assessment and Plan / ED Course  I have reviewed the triage vital signs and the nursing notes.  Pertinent labs & imaging results that were available during my care of the patient were reviewed by me and considered in my medical decision making (see chart for details).     Symptoms consistent with influenza-like illness.  Plan Tessalon Perles.  Tylenol.  Encourage oral hydration follow-up with Dr. Juanetta Gosling if not better in 3 or 4 days.  Stop amoxicillin  Final Clinical Impressions(s) / ED Diagnoses   diagnosis influenza-like illness Final diagnoses:  None    ED Discharge Orders    None       Doug Sou, MD 09/23/17 [id number]    Doug Sou, MD 09/23/17 [phone]

## 2017-09-23 NOTE — Discharge Instructions (Signed)
It is okay to stop amoxicillin.  Take the cough medicine prescribed as directed.  Take Tylenol every 4 hours for aches or for temperature higher than 100.4.Make sure that you drink at least six 8 ounce glasses of water or Gatorade each day in order to stay well-hydrated.  See Dr. Juanetta Gosling in the office if not feeling better in 3 or 4 days.  Return if your condition worsens for any reason

## 2017-09-23 NOTE — ED Triage Notes (Signed)
Pt c/o severe sore throat, fever, CP, and SOB since Friday. Pt has been taking amoxicillan, but states she is just feeling worse.

## 2017-09-23 NOTE — ED Notes (Signed)
Updated pt on wait time. Pt states she is hot and cold. No temp noted. Will keep pt updated

## 2017-09-28 ENCOUNTER — Other Ambulatory Visit: Payer: Self-pay

## 2017-09-28 ENCOUNTER — Emergency Department (HOSPITAL_COMMUNITY)
Admission: EM | Admit: 2017-09-28 | Discharge: 2017-09-28 | Disposition: A | Discharge status: Home / Self Care | Payer: BLUE CROSS/BLUE SHIELD | Attending: Emergency Medicine | Admitting: Emergency Medicine

## 2017-09-28 ENCOUNTER — Encounter (HOSPITAL_COMMUNITY): Payer: Self-pay | Admitting: *Deleted

## 2017-09-28 DIAGNOSIS — I252 Old myocardial infarction: Secondary | ICD-10-CM | POA: Insufficient documentation

## 2017-09-28 DIAGNOSIS — Z87891 Personal history of nicotine dependence: Secondary | ICD-10-CM | POA: Insufficient documentation

## 2017-09-28 DIAGNOSIS — I2510 Atherosclerotic heart disease of native coronary artery without angina pectoris: Secondary | ICD-10-CM | POA: Diagnosis not present

## 2017-09-28 DIAGNOSIS — Z9049 Acquired absence of other specified parts of digestive tract: Secondary | ICD-10-CM | POA: Diagnosis not present

## 2017-09-28 DIAGNOSIS — F329 Major depressive disorder, single episode, unspecified: Secondary | ICD-10-CM | POA: Insufficient documentation

## 2017-09-28 DIAGNOSIS — R05 Cough: Secondary | ICD-10-CM | POA: Diagnosis not present

## 2017-09-28 DIAGNOSIS — Z7982 Long term (current) use of aspirin: Secondary | ICD-10-CM | POA: Insufficient documentation

## 2017-09-28 DIAGNOSIS — I10 Essential (primary) hypertension: Secondary | ICD-10-CM | POA: Diagnosis not present

## 2017-09-28 DIAGNOSIS — Z951 Presence of aortocoronary bypass graft: Secondary | ICD-10-CM | POA: Diagnosis not present

## 2017-09-28 DIAGNOSIS — M797 Fibromyalgia: Secondary | ICD-10-CM | POA: Insufficient documentation

## 2017-09-28 DIAGNOSIS — F419 Anxiety disorder, unspecified: Secondary | ICD-10-CM | POA: Diagnosis not present

## 2017-09-28 DIAGNOSIS — R69 Illness, unspecified: Secondary | ICD-10-CM

## 2017-09-28 DIAGNOSIS — R110 Nausea: Secondary | ICD-10-CM | POA: Diagnosis not present

## 2017-09-28 DIAGNOSIS — J111 Influenza due to unidentified influenza virus with other respiratory manifestations: Secondary | ICD-10-CM

## 2017-09-28 DIAGNOSIS — R0981 Nasal congestion: Secondary | ICD-10-CM | POA: Diagnosis not present

## 2017-09-28 DIAGNOSIS — Z79899 Other long term (current) drug therapy: Secondary | ICD-10-CM | POA: Diagnosis not present

## 2017-09-28 MED ORDER — ONDANSETRON 4 MG PO TBDP: 4.0000 mg | ORAL_TABLET | Freq: Four times a day (QID) | ORAL | 0 refills | Status: DC | PRN

## 2017-09-28 MED ORDER — DEXAMETHASONE 4 MG PO TABS: 4.0000 mg | ORAL_TABLET | Freq: Two times a day (BID) | ORAL | 0 refills | Status: DC

## 2017-09-28 NOTE — ED Provider Notes (Signed)
[facility] EMERGENCY DEPARTMENT Provider Note   CSN: [id number] Arrival date & time: 09/28/17  1616     History   Chief Complaint Chief Complaint  Patient presents with  . Influenza    HPI Carolyn Gaines is a 52 y.o. female.  Patient is a 52 year old female who presents to the emergency department with a complaint of flu.  The patient states that approximately 5 or 6 days ago she was diagnosed with flu.  She states that she has been trying to rest and use Tylenol.  She was scheduled to return to work today.  She returned to work, but continued to have shaking chills and body aching.  She has been nauseated.  She has had problems keeping things down on her stomach.  She fears that she has been unable to keep up with her liquids as she should.  She has not measured a temperature elevation, but states she has shaking chills and body aches.  It is also of note that she suffers from fibromyalgia, which she says is made worse by this illness.      Past Medical History:  Diagnosis Date  . Anemia    low iron  . Anxiety   . Arthritis   . Coronary artery disease   . Depression   . Fibromyalgia   . Gallstones   . GERD (gastroesophageal reflux disease)   . Headache    migraines  . MI (myocardial infarction) (HCC) 01/2016  . Peptic ulcer   . Seizures (HCC)    as a baby    Patient Active Problem List   Diagnosis Date Noted  . Calculus of gallbladder without cholecystitis without obstruction   . 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 Unstable angina (HCC)   . Fibromyalgia 10/20/2016  . Essential hypertension 07/18/2016  . Psoriasis 07/18/2016  . Precordial chest pain 07/03/2016  . Depression with anxiety 07/03/2016  . Normocytic anemia 07/03/2016  . History of acute inferior wall MI 02/19/2016  . Coronary artery disease involving native heart without angina pectoris 02/19/2016    Past Surgical History:  Procedure Laterality Date  . ABDOMINAL HYSTERECTOMY    . CARDIAC  CATHETERIZATION N/A 02/19/2016   Procedure: Left Heart Cath and Coronary Angiography;  Surgeon: Yates Decamp, MD;  Location: [facility] INVASIVE CV LAB;  Service: Cardiovascular;  Laterality: N/A;  . CARDIAC CATHETERIZATION N/A 02/19/2016   Procedure: Coronary Stent Intervention;  Surgeon: Yates Decamp, MD;  Location: [facility] INVASIVE CV LAB;  Service: Cardiovascular;  Laterality: N/A;  . CESAREAN SECTION     x 2  . CHOLECYSTECTOMY N/A 01/25/2017   Procedure: LAPAROSCOPIC CHOLECYSTECTOMY;  Surgeon: Franky Macho, MD;  Location: [facility] ORS;  Service: General;  Laterality: N/A;  . COLONOSCOPY    . CORONARY ARTERY BYPASS GRAFT N/A 10/26/2016   Procedure: CORONARY ARTERY BYPASS GRAFTING (CABG), ON PUMP, TIMES THREE, USING LEFT INTERNAL MAMMARY ARTERY AND ENDOSCOPICALLY HARVESTED RIGHT GREATER SAPHENOUS VEIN;  Surgeon: Alleen Borne, MD;  Location: [facility] OR;  Service: Open Heart Surgery;  Laterality: N/A;  LIMA-LAD SVG-RCA SVG-OM  . ESOPHAGOGASTRODUODENOSCOPY    . LEFT HEART CATH AND CORONARY ANGIOGRAPHY N/A 10/22/2016   Procedure: Left Heart Cath and Coronary Angiography;  Surgeon: Lennette Bihari, MD;  Location: [facility] INVASIVE CV LAB;  Service: Cardiovascular;  Laterality: N/A;  . STERNAL WIRES REMOVAL N/A 07/11/2017   Procedure: STERNAL WIRES REMOVAL;  Surgeon: Alleen Borne, MD;  Location: [facility] OR;  Service: Thoracic;  Laterality: N/A;  . TEE WITHOUT  CARDIOVERSION N/A 10/26/2016   Procedure: TRANSESOPHAGEAL ECHOCARDIOGRAM (TEE);  Surgeon: Alleen Borne, MD;  Location: [facility] OR;  Service: Open Heart Surgery;  Laterality: N/A;  . WRIST FRACTURE SURGERY Left    3 times    OB History    No data available       Home Medications    Prior to Admission medications   Medication Sig Start Date End Date Taking? Authorizing Provider  acetaminophen (TYLENOL) 500 MG tablet Take 1,000 mg by mouth every 6 (six) hours as needed for mild pain.    [provider]  aspirin EC 81 MG EC tablet Take 1 tablet (81 mg total) by mouth  daily. 02/20/16   Marcy Salvo, NP  azithromycin (ZITHROMAX Z-PAK) 250 MG tablet Take 2 tablets by mouth on day one followed by one tablet daily for 4 days. 08/07/17   Burgess Amor, PA-C  benzonatate (TESSALON) 100 MG capsule Take 1 capsule (100 mg total) by mouth 3 (three) times daily as needed for cough. 09/23/17   Doug Sou, MD  carvedilol (COREG) 3.125 MG tablet Take 1 tablet (3.125 mg total) by mouth 2 (two) times daily with a meal. 03/20/17   [name], MD  chlorpheniramine-HYDROcodone (TUSSIONEX PENNKINETIC ER) 10-8 MG/5ML SUER Take 5 mLs by mouth every 12 (twelve) hours as needed for cough. 08/07/17   Burgess Amor, PA-C  escitalopram (LEXAPRO) 20 MG tablet Take 1 tablet (20 mg total) by mouth daily. 11/26/16   Jodelle Gross, NP  nitroGLYCERIN (NITROSTAT) 0.4 MG SL tablet PLACE 1 T UNDER THE TONGUE Q 5 MINUTES FOR 3 DOSES AS NEEDED FOR CHEST PAIN 03/14/17   [name], MD  pantoprazole (PROTONIX) 40 MG tablet Take 40 mg by mouth at bedtime. 03/09/17   [provider]  rosuvastatin (CRESTOR) 40 MG tablet Take 1 tablet (40 mg total) by mouth daily. 03/19/17 07/04/17  [name], MD  traMADol (ULTRAM) 50 MG tablet Take 50 mg by mouth every 6 (six) hours as needed for severe pain.    [provider]    Family History Family History  Problem Relation Age of Onset  . COPD Mother   . Arthritis Mother   . Heart disease Mother   . Stroke Mother   . Psoriasis Father   . Heart disease Father   . Diabetes Maternal Grandmother   . Diabetes Maternal Aunt   . Diabetes Maternal Aunt     Social History Social History   Tobacco Use  . Smoking status: Former Smoker    Last attempt to quit: 03/21/2011    Years since quitting: 6.5  . Smokeless tobacco: Never Used  Substance Use Topics  . Alcohol use: Yes    Comment: rare  . Drug use: No     Allergies   Bee venom; Adhesive [tape]; and Triple antibiotic  [bacitracin-neomycin-polymyxin]   Review of Systems Review of Systems  Constitutional: Positive for activity change, appetite change and chills.       All ROS Neg except as noted in HPI  HENT: Positive for congestion. Negative for nosebleeds.   Eyes: Negative for photophobia and discharge.  Respiratory: Positive for cough. Negative for shortness of breath and wheezing.   Cardiovascular: Negative for chest pain and palpitations.  Gastrointestinal: Negative for abdominal pain and blood in stool.  Genitourinary: Negative for dysuria, frequency and hematuria.  Musculoskeletal: Positive for myalgias. Negative for arthralgias, back pain and neck pain.  Skin: Negative.   Neurological: Negative for  dizziness, seizures and speech difficulty.  Psychiatric/Behavioral: Negative for confusion and hallucinations.     Physical Exam Updated Vital Signs BP 98/76   Pulse 66   Temp 98.9 F (37.2 C) (Oral)   Resp 20   Ht 5\' 3"  (1.6 m)   Wt 72.6 kg (160 lb)   SpO2 100%   BMI 28.34 kg/m   Physical Exam  Constitutional: She is oriented to person, place, and time. She appears well-developed and well-nourished.  Non-toxic appearance.  HENT:  Head: Normocephalic.  Right Ear: Tympanic membrane and external ear normal.  Left Ear: Tympanic membrane and external ear normal.  Nasal congestion present.  Eyes: EOM and lids are normal. Pupils are equal, round, and reactive to light.  Neck: Normal range of motion. Neck supple. Carotid bruit is not present.  Cardiovascular: Normal rate, regular rhythm, normal heart sounds, intact distal pulses and normal pulses.  Pulmonary/Chest: No stridor. No respiratory distress. She has no wheezes. She has rhonchi.  Abdominal: Soft. Bowel sounds are normal. There is no tenderness. There is no guarding.  Abdominal wall soreness present.  Bowel sounds are present and active.  There is no distention, no mass, no CVA tenderness.  Musculoskeletal: Normal range of motion.   Muscle soreness of multiple sites.  Lymphadenopathy:       Head (right side): No submandibular adenopathy present.       Head (left side): No submandibular adenopathy present.    She has no cervical adenopathy.  Neurological: She is alert and oriented to person, place, and time. She has normal strength. No cranial nerve deficit or sensory deficit.  Skin: Skin is warm and dry.  Psychiatric: She has a normal mood and affect. Her speech is normal.  Nursing note and vitals reviewed.    ED Treatments / Results  Labs (all labs ordered are listed, but only abnormal results are displayed) Labs Reviewed - No data to display  EKG  EKG Interpretation None       Radiology No results found.  Procedures Procedures (including critical care time)  Medications Ordered in ED Medications - No data to display   Initial Impression / Assessment and Plan / ED Course  I have reviewed the triage vital signs and the nursing notes.  Pertinent labs & imaging results that were available during my care of the patient were reviewed by me and considered in my medical decision making (see chart for details).       Final Clinical Impressions(s) / ED Diagnoses MDM  Temperature at this time is within normal limits.  There is no tachycardia or tachypnea noted.  The patient appears uncomfortable, but in no acute distress.  I suspect that the patient has some residual influenza symptoms aggravated by fibromyalgia.  I have asked the patient to use short course of steroid.  Patient will use Tylenol extra strength every 4 hours for fever and aching.  The patient will use Ultram for more severe pain.  The patient has the Ultram that she uses for her from fibromyalgia pain.  Of asked patient to see Dr. Juanetta Gosling for additional evaluation if not improving.  Patient receives a note excusing her for work until March 7.   Final diagnoses:  Influenza-like illness  Fibromyalgia    ED Discharge Orders         Ordered    dexamethasone (DECADRON) 4 MG tablet  2 times daily with meals     09/28/17 1727    ondansetron (ZOFRAN-ODT) 4 MG disintegrating tablet  Every 6 hours PRN     09/28/17 1727       Ivery Quale, PA-C 09/28/17 1730    Loren Racer, MD 09/28/17 2340

## 2017-09-28 NOTE — Discharge Instructions (Signed)
Please continue to use her mask until symptoms have resolved.  Please increase water, Gatorade, juices, etc.  Use Tylenol every 4 hours for fever and aching.  Use Zofran every 6 hours for nausea.  Please use Decadron 2 times daily with food.  You may continue your Zofran for more severe pain or aching.  Please see Dr. Juanetta Gosling for additional evaluation if not improving.

## 2017-09-28 NOTE — ED Triage Notes (Signed)
Seen 5 days ago and diagnosed with the flu, states she is unable to return to work at this time, still has body aches

## 2017-09-28 NOTE — ED Notes (Signed)
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

## 2017-10-11 DIAGNOSIS — I10 Essential (primary) hypertension: Secondary | ICD-10-CM | POA: Diagnosis not present

## 2017-10-11 DIAGNOSIS — I2510 Atherosclerotic heart disease of native coronary artery without angina pectoris: Secondary | ICD-10-CM | POA: Diagnosis not present

## 2017-10-11 DIAGNOSIS — M797 Fibromyalgia: Secondary | ICD-10-CM | POA: Diagnosis not present

## 2017-10-11 DIAGNOSIS — R079 Chest pain, unspecified: Secondary | ICD-10-CM | POA: Diagnosis not present

## 2017-10-22 ENCOUNTER — Other Ambulatory Visit (HOSPITAL_COMMUNITY): Payer: Self-pay | Admitting: Pulmonary Disease

## 2017-10-22 DIAGNOSIS — R0602 Shortness of breath: Secondary | ICD-10-CM

## 2017-10-22 DIAGNOSIS — R079 Chest pain, unspecified: Secondary | ICD-10-CM

## 2017-11-06 ENCOUNTER — Ambulatory Visit (HOSPITAL_COMMUNITY): Payer: BLUE CROSS/BLUE SHIELD

## 2017-11-21 ENCOUNTER — Ambulatory Visit (HOSPITAL_COMMUNITY): Admission: RE | Admit: 2017-11-21 | Payer: BLUE CROSS/BLUE SHIELD | Source: Ambulatory Visit

## 2017-11-29 ENCOUNTER — Emergency Department (HOSPITAL_COMMUNITY)
Admission: EM | Admit: 2017-11-29 | Discharge: 2017-11-29 | Disposition: A | Discharge status: Home / Self Care | Payer: BLUE CROSS/BLUE SHIELD | Attending: Emergency Medicine | Admitting: Emergency Medicine

## 2017-11-29 ENCOUNTER — Emergency Department (HOSPITAL_COMMUNITY): Payer: BLUE CROSS/BLUE SHIELD

## 2017-11-29 ENCOUNTER — Other Ambulatory Visit: Payer: Self-pay

## 2017-11-29 ENCOUNTER — Encounter (HOSPITAL_COMMUNITY): Payer: Self-pay

## 2017-11-29 DIAGNOSIS — I10 Essential (primary) hypertension: Secondary | ICD-10-CM | POA: Insufficient documentation

## 2017-11-29 DIAGNOSIS — Z79899 Other long term (current) drug therapy: Secondary | ICD-10-CM | POA: Insufficient documentation

## 2017-11-29 DIAGNOSIS — R0789 Other chest pain: Secondary | ICD-10-CM

## 2017-11-29 DIAGNOSIS — I2510 Atherosclerotic heart disease of native coronary artery without angina pectoris: Secondary | ICD-10-CM | POA: Insufficient documentation

## 2017-11-29 DIAGNOSIS — Z87891 Personal history of nicotine dependence: Secondary | ICD-10-CM | POA: Insufficient documentation

## 2017-11-29 DIAGNOSIS — R079 Chest pain, unspecified: Secondary | ICD-10-CM | POA: Diagnosis not present

## 2017-11-29 DIAGNOSIS — Z7982 Long term (current) use of aspirin: Secondary | ICD-10-CM | POA: Diagnosis not present

## 2017-11-29 LAB — COMPREHENSIVE METABOLIC PANEL
ALK PHOS: 77 U/L (ref 38–126)
ALT: 11 U/L — ABNORMAL LOW (ref 14–54)
AST: 18 U/L (ref 15–41)
Albumin: 3.4 g/dL — ABNORMAL LOW (ref 3.5–5.0)
Anion gap: 7 (ref 5–15)
BUN: 8 mg/dL (ref 6–20)
CALCIUM: 8.7 mg/dL — AB (ref 8.9–10.3)
CHLORIDE: 112 mmol/L — AB (ref 101–111)
CO2: 24 mmol/L (ref 22–32)
Creatinine, Ser: 0.85 mg/dL (ref 0.44–1.00)
Glucose, Bld: 87 mg/dL (ref 65–99)
Potassium: 4.1 mmol/L (ref 3.5–5.1)
Sodium: 143 mmol/L (ref 135–145)
Total Bilirubin: 0.3 mg/dL (ref 0.3–1.2)
Total Protein: 6.6 g/dL (ref 6.5–8.1)

## 2017-11-29 LAB — CBC
HCT: 34.5 % — ABNORMAL LOW (ref 36.0–46.0)
Hemoglobin: 10.9 g/dL — ABNORMAL LOW (ref 12.0–15.0)
MCH: 27.7 pg (ref 26.0–34.0)
MCHC: 31.6 g/dL (ref 30.0–36.0)
MCV: 87.8 fL (ref 78.0–100.0)
PLATELETS: 235 10*3/uL (ref 150–400)
RBC: 3.93 MIL/uL (ref 3.87–5.11)
RDW: 14 % (ref 11.5–15.5)
WBC: 3.5 10*3/uL — AB (ref 4.0–10.5)

## 2017-11-29 LAB — I-STAT TROPONIN, ED
TROPONIN I, POC: 0 ng/mL (ref 0.00–0.08)
Troponin i, poc: 0.01 ng/mL (ref 0.00–0.08)

## 2017-11-29 MED ORDER — CYCLOBENZAPRINE HCL 10 MG PO TABS: 10.0000 mg | ORAL_TABLET | Freq: Two times a day (BID) | ORAL | 0 refills | Status: DC | PRN

## 2017-11-29 MED ORDER — KETOROLAC TROMETHAMINE 15 MG/ML IJ SOLN
15.0000 mg | Freq: Once | INTRAMUSCULAR | Status: AC
  Administered 2017-11-29: 15 mg via INTRAVENOUS
  Filled 2017-11-29: qty 1

## 2017-11-29 MED ORDER — FENTANYL CITRATE (PF) 100 MCG/2ML IJ SOLN
50.0000 ug | Freq: Once | INTRAMUSCULAR | Status: AC
  Administered 2017-11-29: 50 ug via INTRAVENOUS
  Filled 2017-11-29: qty 2

## 2017-11-29 MED ORDER — LIDOCAINE 5 % EX PTCH: 1.0000 | MEDICATED_PATCH | CUTANEOUS | 0 refills | Status: DC

## 2017-11-29 MED ORDER — ASPIRIN 81 MG PO CHEW: 324.0000 mg | CHEWABLE_TABLET | Freq: Once | ORAL | Status: DC

## 2017-11-29 MED ORDER — LIDOCAINE 5 % EX PTCH
1.0000 | MEDICATED_PATCH | CUTANEOUS | Status: DC
  Administered 2017-11-29: 1 via TRANSDERMAL
  Filled 2017-11-29: qty 1

## 2017-11-29 NOTE — ED Triage Notes (Signed)
Per ems- sudden onset central CP today, "burning", came on while at work, has hx of CABG and stents. EKG unremarkable. BP dropped 90/50 in route. She took 1 nitro PTA, 324 aspirin PTA. Denies n/v/diaphoresis. Also, has RUQ abd pain, that is chronic, has had her gladder removed. 18 LAC, given 400 NS cc.

## 2017-11-29 NOTE — ED Notes (Signed)
Patient reports that she has new left chest pain. "the pain medicine did not make me groggy or nothing, I take 350mg  Tramadol and that works, it's like candy to me" Reporting pain of 8 on a 0-10 scale.

## 2017-11-29 NOTE — ED Notes (Signed)
Patient Alert and oriented to baseline. Stable and ambulatory to baseline. Patient verbalized understanding of the discharge instructions.  Patient belongings were taken by the patient.   

## 2017-11-29 NOTE — ED Provider Notes (Signed)
[facility] EMERGENCY DEPARTMENT Provider Note   CSN: [id number] Arrival date & time: 11/29/17  1415     History   Chief Complaint Chief Complaint  Patient presents with  . Chest Pain    HPI Carolyn Gaines is a 52 y.o. female.  The history is provided by the patient.  Chest Pain   This is a new problem. The current episode started 3 to 5 hours ago. The problem occurs constantly. The problem has not changed since onset.The pain is associated with movement, breathing and raising an arm. The pain is present in the lateral region. The pain is at a severity of 5/10. The pain is moderate. The quality of the pain is described as sharp. The pain does not radiate. Pertinent negatives include no abdominal pain, no back pain, no cough, no fever, no palpitations, no shortness of breath and no vomiting. She has tried nitroglycerin for the symptoms. The treatment provided no relief.  Her past medical history is significant for CAD (s/p CABG).  Pertinent negatives for past medical history include no seizures.    Past Medical History:  Diagnosis Date  . Anemia    low iron  . Anxiety   . Arthritis   . Coronary artery disease   . Depression   . Fibromyalgia   . Gallstones   . GERD (gastroesophageal reflux disease)   . Headache    migraines  . MI (myocardial infarction) (HCC) 01/2016  . Peptic ulcer   . Seizures (HCC)    as a baby    Patient Active Problem List   Diagnosis Date Noted  . Calculus of gallbladder without cholecystitis without obstruction   . 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 Unstable angina (HCC)   . Fibromyalgia 10/20/2016  . Essential hypertension 07/18/2016  . Psoriasis 07/18/2016  . Precordial chest pain 07/03/2016  . Depression with anxiety 07/03/2016  . Normocytic anemia 07/03/2016  . History of acute inferior wall MI 02/19/2016  . Coronary artery disease involving native heart without angina pectoris 02/19/2016    Past Surgical History:    Procedure Laterality Date  . ABDOMINAL HYSTERECTOMY    . CARDIAC CATHETERIZATION N/A 02/19/2016   Procedure: Left Heart Cath and Coronary Angiography;  Surgeon: Yates Decamp, MD;  Location: [facility] INVASIVE CV LAB;  Service: Cardiovascular;  Laterality: N/A;  . CARDIAC CATHETERIZATION N/A 02/19/2016   Procedure: Coronary Stent Intervention;  Surgeon: Yates Decamp, MD;  Location: [facility] INVASIVE CV LAB;  Service: Cardiovascular;  Laterality: N/A;  . CESAREAN SECTION     x 2  . CHOLECYSTECTOMY N/A 01/25/2017   Procedure: LAPAROSCOPIC CHOLECYSTECTOMY;  Surgeon: Franky Macho, MD;  Location: [facility] ORS;  Service: General;  Laterality: N/A;  . COLONOSCOPY    . CORONARY ARTERY BYPASS GRAFT N/A 10/26/2016   Procedure: CORONARY ARTERY BYPASS GRAFTING (CABG), ON PUMP, TIMES THREE, USING LEFT INTERNAL MAMMARY ARTERY AND ENDOSCOPICALLY HARVESTED RIGHT GREATER SAPHENOUS VEIN;  Surgeon: Alleen Borne, MD;  Location: [facility] OR;  Service: Open Heart Surgery;  Laterality: N/A;  LIMA-LAD SVG-RCA SVG-OM  . ESOPHAGOGASTRODUODENOSCOPY    . LEFT HEART CATH AND CORONARY ANGIOGRAPHY N/A 10/22/2016   Procedure: Left Heart Cath and Coronary Angiography;  Surgeon: Lennette Bihari, MD;  Location: [facility] INVASIVE CV LAB;  Service: Cardiovascular;  Laterality: N/A;  . STERNAL WIRES REMOVAL N/A 07/11/2017   Procedure: STERNAL WIRES REMOVAL;  Surgeon: Alleen Borne, MD;  Location: [facility] OR;  Service: Thoracic;  Laterality: N/A;  . TEE WITHOUT  CARDIOVERSION N/A 10/26/2016   Procedure: TRANSESOPHAGEAL ECHOCARDIOGRAM (TEE);  Surgeon: Alleen Borne, MD;  Location: [facility] OR;  Service: Open Heart Surgery;  Laterality: N/A;  . WRIST FRACTURE SURGERY Left    3 times     OB History   None      Home Medications    Prior to Admission medications   Medication Sig Start Date End Date Taking? Authorizing Provider  acetaminophen (TYLENOL) 500 MG tablet Take 1,000 mg by mouth every 6 (six) hours as needed for mild pain.   Yes [provider]   carvedilol (COREG) 3.125 MG tablet Take 1 tablet (3.125 mg total) by mouth 2 (two) times daily with a meal. 03/20/17  Yes Branch, Dorothe Pea, MD  chlorpheniramine-HYDROcodone (TUSSIONEX PENNKINETIC ER) 10-8 MG/5ML SUER Take 5 mLs by mouth every 12 (twelve) hours as needed for cough. 08/07/17  Yes Idol, Raynelle Fanning, PA-C  dexamethasone (DECADRON) 4 MG tablet Take 1 tablet (4 mg total) by mouth 2 (two) times daily with a meal. 09/28/17  Yes Ivery Quale, PA-C  escitalopram (LEXAPRO) 20 MG tablet Take 1 tablet (20 mg total) by mouth daily. 11/26/16  Yes Jodelle Gross, NP  ondansetron (ZOFRAN-ODT) 4 MG disintegrating tablet Take 1 tablet (4 mg total) by mouth every 6 (six) hours as needed for nausea or vomiting. 09/28/17  Yes Ivery Quale, PA-C  pantoprazole (PROTONIX) 40 MG tablet Take 40 mg by mouth at bedtime. 03/09/17  Yes [provider]  rosuvastatin (CRESTOR) 40 MG tablet Take 1 tablet (40 mg total) by mouth daily. 03/19/17 11/29/17 Yes Branch, Dorothe Pea, MD  traMADol (ULTRAM) 50 MG tablet Take 50 mg by mouth every 6 (six) hours as needed for severe pain.   Yes [provider]  aspirin EC 81 MG EC tablet Take 1 tablet (81 mg total) by mouth daily. Patient not taking: Reported on 11/29/2017 02/20/16   Marcy Salvo, NP  azithromycin (ZITHROMAX Z-PAK) 250 MG tablet Take 2 tablets by mouth on day one followed by one tablet daily for 4 days. Patient not taking: Reported on 11/29/2017 08/07/17   Burgess Amor, PA-C  benzonatate (TESSALON) 100 MG capsule Take 1 capsule (100 mg total) by mouth 3 (three) times daily as needed for cough. Patient not taking: Reported on 11/29/2017 09/23/17   Doug Sou, MD  cyclobenzaprine (FLEXERIL) 10 MG tablet Take 1 tablet (10 mg total) by mouth 2 (two) times daily as needed for muscle spasms. 11/29/17   Karee Christopherson, DO  lidocaine (LIDODERM) 5 % Place 1 patch onto the skin daily. Remove & Discard patch within 12 hours or as directed by MD 11/29/17   Torres Hardenbrook,  Jasdeep Kepner, DO  nitroGLYCERIN (NITROSTAT) 0.4 MG SL tablet PLACE 1 T UNDER THE TONGUE Q 5 MINUTES FOR 3 DOSES AS NEEDED FOR CHEST PAIN 03/14/17   [name], MD    Family History Family History  Problem Relation Age of Onset  . COPD Mother   . Arthritis Mother   . Heart disease Mother   . Stroke Mother   . Psoriasis Father   . Heart disease Father   . Diabetes Maternal Grandmother   . Diabetes Maternal Aunt   . Diabetes Maternal Aunt     Social History Social History   Tobacco Use  . Smoking status: Former Smoker    Last attempt to quit: 03/21/2011    Years since quitting: 6.6  . Smokeless tobacco: Never Used  Substance Use Topics  . Alcohol use: Yes  Comment: rare  . Drug use: No     Allergies   Bee venom; Adhesive [tape]; and Triple antibiotic [bacitracin-neomycin-polymyxin]   Review of Systems Review of Systems  Constitutional: Negative for chills and fever.  HENT: Negative for ear pain and sore throat.   Eyes: Negative for pain and visual disturbance.  Respiratory: Negative for cough and shortness of breath.   Cardiovascular: Positive for chest pain. Negative for palpitations.  Gastrointestinal: Negative for abdominal pain and vomiting.  Genitourinary: Negative for dysuria and hematuria.  Musculoskeletal: Negative for arthralgias and back pain.  Skin: Negative for color change and rash.  Neurological: Negative for seizures and syncope.  All other systems reviewed and are negative.    Physical Exam Updated Vital Signs  ED Triage Vitals  Enc Vitals Group     BP 11/29/17 1430 105/67     Pulse Rate 11/29/17 1430 68     Resp 11/29/17 1430 16     Temp 11/29/17 1426 97.9 F (36.6 C)     Temp Source 11/29/17 1426 Oral     SpO2 11/29/17 1430 100 %     Weight 11/29/17 1427 160 lb (72.6 kg)     Height 11/29/17 1427 5\' 3"  (1.6 m)     Head Circumference --      Peak Flow --      Pain Score 11/29/17 1426 8     Pain Loc --      Pain Edu? --      Excl. in  GC? --     Physical Exam  Constitutional: She is oriented to person, place, and time. She appears well-developed and well-nourished. No distress.  HENT:  Head: Normocephalic and atraumatic.  Eyes: Pupils are equal, round, and reactive to light. Conjunctivae and EOM are normal.  Neck: Normal range of motion. Neck supple.  Cardiovascular: Normal rate, regular rhythm, intact distal pulses and normal pulses.  No murmur heard. Pulmonary/Chest: Effort normal and breath sounds normal. No respiratory distress. She has no decreased breath sounds.  Abdominal: Soft. There is no tenderness.  Musculoskeletal: She exhibits no edema.       Right lower leg: She exhibits no edema.       Left lower leg: She exhibits no edema.  TTP to right side of chest wall  Neurological: She is alert and oriented to person, place, and time.  Skin: Skin is warm and dry.  Psychiatric: She has a normal mood and affect.  Nursing note and vitals reviewed.    ED Treatments / Results  Labs (all labs ordered are listed, but only abnormal results are displayed) Labs Reviewed  CBC - Abnormal; Notable for the following components:      Result Value   WBC 3.5 (*)    Hemoglobin 10.9 (*)    HCT 34.5 (*)    All other components within normal limits  COMPREHENSIVE METABOLIC PANEL - Abnormal; Notable for the following components:   Chloride 112 (*)    Calcium 8.7 (*)    Albumin 3.4 (*)    ALT 11 (*)    All other components within normal limits  I-STAT TROPONIN, ED  I-STAT TROPONIN, ED  CBG MONITORING, ED    EKG EKG Interpretation  Date/Time:  Friday Nov 29 2017 14:28:24 EDT Ventricular Rate:  65 PR Interval:    QRS Duration: 73 QT Interval:  382 QTC Calculation: 398 R Axis:   66 Text Interpretation:  Sinus rhythm Probable left atrial enlargement Baseline wander in lead(s) V3  Otherwise within normal limits Confirmed by Gerhard Munch [phone]) on 11/29/2017 4:38:07 PM   Radiology Dg Chest Portable 1  View  Result Date: 11/29/2017 CLINICAL DATA:  Acute onset severe mid chest pain. EXAM: PORTABLE CHEST 1 VIEW COMPARISON:  Chest x-ray dated September 23, 2017. FINDINGS: The heart size and mediastinal contours are within normal limits. Prior CABG. Normal pulmonary vascularity. Streaky atelectasis/scarring at both lung bases. No focal consolidation, pleural effusion, or pneumothorax. No acute osseous abnormality. IMPRESSION: No active disease. Electronically Signed   By: Obie Dredge M.D.   On: 11/29/2017 15:30    Procedures Procedures (including critical care time)  Medications Ordered in ED Medications  lidocaine (LIDODERM) 5 % 1 patch (1 patch Transdermal Patch Applied 11/29/17 1641)  fentaNYL (SUBLIMAZE) injection 50 mcg (50 mcg Intravenous Given 11/29/17 1640)  ketorolac (TORADOL) 15 MG/ML injection 15 mg (15 mg Intravenous Given 11/29/17 1900)     Initial Impression / Assessment and Plan / ED Course  I have reviewed the triage vital signs and the nursing notes.  Pertinent labs & imaging results that were available during my care of the patient were reviewed by me and considered in my medical decision making (see chart for details).     Carolyn Gaines is a 52 year old female with history of fibromyalgia, anxiety, depression, coronary artery disease status post CABG last year who presents to the ED with right-sided chest wall pain.  Patient with normal vitals.  No fever.  Pain is got worse over the last several days.  Pain is worse with right sided arm movement.  Pain is also worse with tenderness to the chest wall.  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  Describes the pain as a burning pain.  Denies any shortness of breath, abdominal pain.  Patient had CABG last year.  She took her nitroglycerin today without much relief.  Patient denies any PE or DVT risk factors.  No infectious symptoms.  Patient with overall unremarkable exam except for reproducible  tenderness over the right side of the chest wall.  Will obtain basic labs including troponin, EKG.  History and physical is consistent with muscular skeletal pain however given cardiac history concern for ACS.  No infectious symptoms but will get chest x-ray to evaluate for pneumonia.  EKG showed normal sinus rhythm with no signs of ischemic changes. Given lidocaine patch and fentanyl for pain. Patient already took 324 mg of ASA.  Patient with delta troponins within normal limits.  Patient with no significant anemia, likely abnormality, acute kidney injury.  Patient had chest x-ray that showed no signs of pneumonia, pneumothorax, pleural effusion.  Patient given IV Toradol for pain.  Given history and physical believe patient with musculoskeletal type chest pain.  Do not have suspicion for coronary syndrome at this time as believe patient has pulled a muscle in her chest wall.  Recommend follow-up with primary care provider and was discharged home with prescription for lidocaine patch, Flexeril.  Recommend Tylenol at home as well.  Given light duty at work.  Recommend rest, ice when able.  Patient discharged from ED in good condition.  Final Clinical Impressions(s) / ED Diagnoses   Final diagnoses:  Chest wall pain    ED Discharge Orders        Ordered    lidocaine (LIDODERM) 5 %  Every 24 hours     11/29/17 2002    cyclobenzaprine (FLEXERIL) 10 MG tablet  2 times daily  PRN     11/29/17 2002       Virgina Norfolk, DO 11/29/17 2006    Gerhard Munch, MD 11/29/17 [phone]

## 2018-01-19 ENCOUNTER — Encounter (HOSPITAL_COMMUNITY): Payer: Self-pay | Admitting: Emergency Medicine

## 2018-01-19 ENCOUNTER — Emergency Department (HOSPITAL_COMMUNITY): Payer: BLUE CROSS/BLUE SHIELD

## 2018-01-19 ENCOUNTER — Emergency Department (HOSPITAL_COMMUNITY)
Admission: EM | Admit: 2018-01-19 | Discharge: 2018-01-19 | Disposition: A | Discharge status: Home / Self Care | Payer: BLUE CROSS/BLUE SHIELD | Attending: Emergency Medicine | Admitting: Emergency Medicine

## 2018-01-19 ENCOUNTER — Other Ambulatory Visit: Payer: Self-pay

## 2018-01-19 DIAGNOSIS — Z951 Presence of aortocoronary bypass graft: Secondary | ICD-10-CM | POA: Diagnosis not present

## 2018-01-19 DIAGNOSIS — Z87891 Personal history of nicotine dependence: Secondary | ICD-10-CM | POA: Diagnosis not present

## 2018-01-19 DIAGNOSIS — I2510 Atherosclerotic heart disease of native coronary artery without angina pectoris: Secondary | ICD-10-CM | POA: Insufficient documentation

## 2018-01-19 DIAGNOSIS — X509XXA Other and unspecified overexertion or strenuous movements or postures, initial encounter: Secondary | ICD-10-CM | POA: Insufficient documentation

## 2018-01-19 DIAGNOSIS — Z79899 Other long term (current) drug therapy: Secondary | ICD-10-CM | POA: Insufficient documentation

## 2018-01-19 DIAGNOSIS — M546 Pain in thoracic spine: Secondary | ICD-10-CM | POA: Diagnosis not present

## 2018-01-19 DIAGNOSIS — Y990 Civilian activity done for income or pay: Secondary | ICD-10-CM | POA: Insufficient documentation

## 2018-01-19 DIAGNOSIS — S299XXA Unspecified injury of thorax, initial encounter: Secondary | ICD-10-CM | POA: Diagnosis not present

## 2018-01-19 DIAGNOSIS — Y939 Activity, unspecified: Secondary | ICD-10-CM | POA: Diagnosis not present

## 2018-01-19 DIAGNOSIS — Y929 Unspecified place or not applicable: Secondary | ICD-10-CM | POA: Insufficient documentation

## 2018-01-19 DIAGNOSIS — S29012A Strain of muscle and tendon of back wall of thorax, initial encounter: Secondary | ICD-10-CM | POA: Diagnosis not present

## 2018-01-19 DIAGNOSIS — I252 Old myocardial infarction: Secondary | ICD-10-CM | POA: Insufficient documentation

## 2018-01-19 MED ORDER — CYCLOBENZAPRINE HCL 10 MG PO TABS
10.0000 mg | ORAL_TABLET | Freq: Once | ORAL | Status: AC
  Administered 2018-01-19: 10 mg via ORAL
  Filled 2018-01-19: qty 1

## 2018-01-19 MED ORDER — CYCLOBENZAPRINE HCL 10 MG PO TABS: 10.0000 mg | ORAL_TABLET | Freq: Three times a day (TID) | ORAL | 0 refills | Status: DC

## 2018-01-19 MED ORDER — HYDROMORPHONE HCL 1 MG/ML IJ SOLN
0.5000 mg | Freq: Once | INTRAMUSCULAR | Status: AC
  Administered 2018-01-19: 0.5 mg via INTRAMUSCULAR
  Filled 2018-01-19: qty 1

## 2018-01-19 MED ORDER — ONDANSETRON HCL 4 MG PO TABS
4.0000 mg | ORAL_TABLET | Freq: Once | ORAL | Status: AC
  Administered 2018-01-19: 4 mg via ORAL
  Filled 2018-01-19: qty 1

## 2018-01-19 MED ORDER — DEXAMETHASONE 4 MG PO TABS: 4.0000 mg | ORAL_TABLET | Freq: Two times a day (BID) | ORAL | 0 refills | Status: DC

## 2018-01-19 NOTE — ED Provider Notes (Signed)
[facility] EMERGENCY DEPARTMENT Provider Note   CSN: [id number] Arrival date & time: 01/19/18  1511     History   Chief Complaint Chief Complaint  Patient presents with  . Back Pain    HPI Carolyn Gaines is a 52 y.o. female.  Patient is a 52 year old female who presents to the emergency department with complaint of pain from the back of her neck down to her mid back below her shoulder blades.  The patient states that about 5 days ago she lifted a 25+ pound tote over her head.  When she did this she felt a pull and a pop in her back.  She had immediate pain, and this pain got progressively worse.  She has tried lidocaine patches, IcyHot, and as much as 150 mg of Ultram at the time, but still cannot seem to get relief from this pain.  She is tried rest most of the day on yesterday and still has pain with certain movements and changes of position.  She has pain when she takes a deep breath and she has pain with certain movements.  There is been no loss of bowel or bladder function.  Patient is not having falls.  It is of note that the patient suffers from fibromyalgia, but she states this pain is worse than her usual fibromyalgia.  She presents now for assistance with this issue.  The history is provided by the patient.  Back Pain   Pertinent negatives include no chest pain, no abdominal pain and no dysuria.    Past Medical History:  Diagnosis Date  . Anemia    low iron  . Anxiety   . Arthritis   . Coronary artery disease   . Depression   . Fibromyalgia   . Gallstones   . GERD (gastroesophageal reflux disease)   . Headache    migraines  . MI (myocardial infarction) (HCC) 01/2016  . Peptic ulcer   . Seizures (HCC)    as a baby    Patient Active Problem List   Diagnosis Date Noted  . Calculus of gallbladder without cholecystitis without obstruction   . 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 Unstable angina (HCC)   . Fibromyalgia 10/20/2016  . Essential hypertension 07/18/2016  .  Psoriasis 07/18/2016  . Precordial chest pain 07/03/2016  . Depression with anxiety 07/03/2016  . Normocytic anemia 07/03/2016  . History of acute inferior wall MI 02/19/2016  . Coronary artery disease involving native heart without angina pectoris 02/19/2016    Past Surgical History:  Procedure Laterality Date  . ABDOMINAL HYSTERECTOMY    . CARDIAC CATHETERIZATION N/A 02/19/2016   Procedure: Left Heart Cath and Coronary Angiography;  Surgeon: Yates Decamp, MD;  Location: [facility] INVASIVE CV LAB;  Service: Cardiovascular;  Laterality: N/A;  . CARDIAC CATHETERIZATION N/A 02/19/2016   Procedure: Coronary Stent Intervention;  Surgeon: Yates Decamp, MD;  Location: [facility] INVASIVE CV LAB;  Service: Cardiovascular;  Laterality: N/A;  . CESAREAN SECTION     x 2  . CHOLECYSTECTOMY N/A 01/25/2017   Procedure: LAPAROSCOPIC CHOLECYSTECTOMY;  Surgeon: Franky Macho, MD;  Location: [facility] ORS;  Service: General;  Laterality: N/A;  . COLONOSCOPY    . CORONARY ARTERY BYPASS GRAFT N/A 10/26/2016   Procedure: CORONARY ARTERY BYPASS GRAFTING (CABG), ON PUMP, TIMES THREE, USING LEFT INTERNAL MAMMARY ARTERY AND ENDOSCOPICALLY HARVESTED RIGHT GREATER SAPHENOUS VEIN;  Surgeon: Alleen Borne, MD;  Location: [facility] OR;  Service: Open Heart Surgery;  Laterality: N/A;  LIMA-LAD SVG-RCA SVG-OM  .  ESOPHAGOGASTRODUODENOSCOPY    . LEFT HEART CATH AND CORONARY ANGIOGRAPHY N/A 10/22/2016   Procedure: Left Heart Cath and Coronary Angiography;  Surgeon: Lennette Bihari, MD;  Location: [facility] INVASIVE CV LAB;  Service: Cardiovascular;  Laterality: N/A;  . STERNAL WIRES REMOVAL N/A 07/11/2017   Procedure: STERNAL WIRES REMOVAL;  Surgeon: Alleen Borne, MD;  Location: [facility] OR;  Service: Thoracic;  Laterality: N/A;  . TEE WITHOUT CARDIOVERSION N/A 10/26/2016   Procedure: TRANSESOPHAGEAL ECHOCARDIOGRAM (TEE);  Surgeon: Alleen Borne, MD;  Location: [facility] OR;  Service: Open Heart Surgery;  Laterality: N/A;  . WRIST FRACTURE SURGERY Left    3 times     OB  History   None      Home Medications    Prior to Admission medications   Medication Sig Start Date End Date Taking? Authorizing Provider  acetaminophen (TYLENOL) 500 MG tablet Take 1,000 mg by mouth every 6 (six) hours as needed for mild pain.    [provider]  aspirin EC 81 MG EC tablet Take 1 tablet (81 mg total) by mouth daily. Patient not taking: Reported on 11/29/2017 02/20/16   Marcy Salvo, NP  azithromycin (ZITHROMAX Z-PAK) 250 MG tablet Take 2 tablets by mouth on day one followed by one tablet daily for 4 days. Patient not taking: Reported on 11/29/2017 08/07/17   Burgess Amor, PA-C  benzonatate (TESSALON) 100 MG capsule Take 1 capsule (100 mg total) by mouth 3 (three) times daily as needed for cough. Patient not taking: Reported on 11/29/2017 09/23/17   Doug Sou, MD  carvedilol (COREG) 3.125 MG tablet Take 1 tablet (3.125 mg total) by mouth 2 (two) times daily with a meal. 03/20/17   [name], MD  chlorpheniramine-HYDROcodone (TUSSIONEX PENNKINETIC ER) 10-8 MG/5ML SUER Take 5 mLs by mouth every 12 (twelve) hours as needed for cough. 08/07/17   Burgess Amor, PA-C  cyclobenzaprine (FLEXERIL) 10 MG tablet Take 1 tablet (10 mg total) by mouth 2 (two) times daily as needed for muscle spasms. 11/29/17   Curatolo, Adam, DO  dexamethasone (DECADRON) 4 MG tablet Take 1 tablet (4 mg total) by mouth 2 (two) times daily with a meal. 09/28/17   Ivery Quale, PA-C  escitalopram (LEXAPRO) 20 MG tablet Take 1 tablet (20 mg total) by mouth daily. 11/26/16   Jodelle Gross, NP  lidocaine (LIDODERM) 5 % Place 1 patch onto the skin daily. Remove & Discard patch within 12 hours or as directed by MD 11/29/17   Curatolo, Adam, DO  nitroGLYCERIN (NITROSTAT) 0.4 MG SL tablet PLACE 1 T UNDER THE TONGUE Q 5 MINUTES FOR 3 DOSES AS NEEDED FOR CHEST PAIN 03/14/17   [name], MD  ondansetron (ZOFRAN-ODT) 4 MG disintegrating tablet Take 1 tablet (4 mg total) by mouth every 6 (six) hours  as needed for nausea or vomiting. 09/28/17   Ivery Quale, PA-C  pantoprazole (PROTONIX) 40 MG tablet Take 40 mg by mouth at bedtime. 03/09/17   [provider]  rosuvastatin (CRESTOR) 40 MG tablet Take 1 tablet (40 mg total) by mouth daily. 03/19/17 11/29/17  [name], MD  traMADol (ULTRAM) 50 MG tablet Take 50 mg by mouth every 6 (six) hours as needed for severe pain.    [provider]    Family History Family History  Problem Relation Age of Onset  . COPD Mother   . Arthritis Mother   . Heart disease Mother   . Stroke Mother   . Psoriasis Father   .  Heart disease Father   . Diabetes Maternal Grandmother   . Diabetes Maternal Aunt   . Diabetes Maternal Aunt     Social History Social History   Tobacco Use  . Smoking status: Former Smoker    Last attempt to quit: 03/21/2011    Years since quitting: 6.8  . Smokeless tobacco: Never Used  Substance Use Topics  . Alcohol use: Yes    Comment: rare  . Drug use: No     Allergies   Bee venom; Adhesive [tape]; and Triple antibiotic [bacitracin-neomycin-polymyxin]   Review of Systems Review of Systems  Constitutional: Negative for activity change.       All ROS Neg except as noted in HPI  HENT: Negative for nosebleeds.   Eyes: Negative for photophobia and discharge.  Respiratory: Negative for cough, shortness of breath and wheezing.   Cardiovascular: Negative for chest pain and palpitations.  Gastrointestinal: Negative for abdominal pain and blood in stool.  Genitourinary: Negative for dysuria, frequency and hematuria.  Musculoskeletal: Positive for back pain. Negative for arthralgias and neck pain.  Skin: Negative.   Neurological: Negative for dizziness, seizures and speech difficulty.  Psychiatric/Behavioral: Negative for confusion and hallucinations.     Physical Exam Updated Vital Signs BP 107/70 (BP Location: Right Arm)   Pulse 74   Temp 98.5 F (36.9 C) (Oral)   Resp 17   Ht 5\' 3"   (1.6 m)   Wt 77.1 kg (170 lb)   SpO2 100%   BMI 30.11 kg/m   Physical Exam  Constitutional: She is oriented to person, place, and time. She appears well-developed and well-nourished.  Non-toxic appearance.  HENT:  Head: Normocephalic.  Right Ear: Tympanic membrane and external ear normal.  Left Ear: Tympanic membrane and external ear normal.  Eyes: Pupils are equal, round, and reactive to light. EOM and lids are normal.  Neck: Normal range of motion. Neck supple. Carotid bruit is not present.  Cardiovascular: Normal rate, regular rhythm, normal heart sounds, intact distal pulses and normal pulses.  Pulmonary/Chest: Breath sounds normal. No respiratory distress.  Abdominal: Soft. Bowel sounds are normal. There is no tenderness. There is no guarding.  Musculoskeletal:       Cervical back: She exhibits decreased range of motion, pain and spasm.       Thoracic back: She exhibits decreased range of motion, pain and spasm.       Back:  Lymphadenopathy:       Head (right side): No submandibular adenopathy present.       Head (left side): No submandibular adenopathy present.    She has no cervical adenopathy.  Neurological: She is alert and oriented to person, place, and time. She has normal strength. No cranial nerve deficit or sensory deficit.  Skin: Skin is warm and dry.  Psychiatric: She has a normal mood and affect. Her speech is normal.  Nursing note and vitals reviewed.    ED Treatments / Results  Labs (all labs ordered are listed, but only abnormal results are displayed) Labs Reviewed - No data to display  EKG None  Radiology No results found.  Procedures Procedures (including critical care time)  Medications Ordered in ED Medications - No data to display   Initial Impression / Assessment and Plan / ED Course  I have reviewed the triage vital signs and the nursing notes.  Pertinent labs & imaging results that were available during my care of the patient were  reviewed by me and considered in my medical decision  making (see chart for details).       Final Clinical Impressions(s) / ED Diagnoses MDM  Vital signs within normal limits.  Pulse oximetry is 100% on room air.  Lungs are clear, temperature is 98.5, pulse oximetry is 98 to 100% on room air.  Doubt pneumonia or lung related issue.  Patient pain can be reproduced with range of motion and with some palpation of the thoracic area.  Suspect lumbar strain.  No gross neurologic deficits appreciated on examination. Prescription for Flexeril and Decadron given to the patient.  I have asked patient to use a heating pad to the area and to rest her back as much as possible.  The patient is to See Dr. Juanetta Gosling, or return to the emergency department if any changes in condition, problems, or concerns.   Final diagnoses:  Strain of thoracic back region    ED Discharge Orders        Ordered    cyclobenzaprine (FLEXERIL) 10 MG tablet  3 times daily     01/19/18 1718    dexamethasone (DECADRON) 4 MG tablet  2 times daily with meals     01/19/18 1718       Ivery Quale, PA-C 01/20/18 2202    Eber Hong, MD 01/28/18 1331

## 2018-01-19 NOTE — ED Triage Notes (Signed)
Back pain from between shoulder blades to mid back since she picked up a 25+ pound tote at work.  Pt reports pain has continued to get worse everyday.  Pt states she left work today to come here.

## 2018-01-19 NOTE — Discharge Instructions (Signed)
Your vital signs have been reviewed.  Your oxygen level is 100% on room air.  Within normal limits by my interpretation.  Your examination suggest several areas of muscle spasm.  The x-ray of your thoracic spine is negative for any compression fractures or dislocation.  Please use a heating pad to the area.  Rest her back is much as possible.  Use Decadron 2 times daily with food.  Use Flexeril 3 times daily for spasm pain. This medication may cause drowsiness. Please do not drink, drive, or participate in activity that requires concentration while taking this medication.  Continue your Ultram for more severe pain.  Please see Dr. Juanetta Gosling for additional evaluation and management if not improving.

## 2018-03-20 DIAGNOSIS — I10 Essential (primary) hypertension: Secondary | ICD-10-CM | POA: Diagnosis not present

## 2018-03-20 DIAGNOSIS — F321 Major depressive disorder, single episode, moderate: Secondary | ICD-10-CM | POA: Diagnosis not present

## 2018-03-20 DIAGNOSIS — M797 Fibromyalgia: Secondary | ICD-10-CM | POA: Diagnosis not present

## 2018-03-20 DIAGNOSIS — I2510 Atherosclerotic heart disease of native coronary artery without angina pectoris: Secondary | ICD-10-CM | POA: Diagnosis not present

## 2018-03-25 ENCOUNTER — Other Ambulatory Visit (HOSPITAL_COMMUNITY)
Admission: RE | Admit: 2018-03-25 | Discharge: 2018-03-25 | Disposition: A | Discharge status: Home / Self Care | Payer: BLUE CROSS/BLUE SHIELD | Source: Ambulatory Visit | Attending: Pulmonary Disease | Admitting: Pulmonary Disease

## 2018-03-25 DIAGNOSIS — M797 Fibromyalgia: Secondary | ICD-10-CM | POA: Diagnosis not present

## 2018-03-25 DIAGNOSIS — I2510 Atherosclerotic heart disease of native coronary artery without angina pectoris: Secondary | ICD-10-CM | POA: Insufficient documentation

## 2018-03-25 DIAGNOSIS — F321 Major depressive disorder, single episode, moderate: Secondary | ICD-10-CM | POA: Insufficient documentation

## 2018-03-25 DIAGNOSIS — I10 Essential (primary) hypertension: Secondary | ICD-10-CM | POA: Insufficient documentation

## 2018-03-25 LAB — COMPREHENSIVE METABOLIC PANEL
ALK PHOS: 84 U/L (ref 38–126)
ALT: 13 U/L (ref 0–44)
AST: 23 U/L (ref 15–41)
Albumin: 3.9 g/dL (ref 3.5–5.0)
Anion gap: 7 (ref 5–15)
BILIRUBIN TOTAL: 0.5 mg/dL (ref 0.3–1.2)
BUN: 12 mg/dL (ref 6–20)
CALCIUM: 8.9 mg/dL (ref 8.9–10.3)
CHLORIDE: 108 mmol/L (ref 98–111)
CO2: 25 mmol/L (ref 22–32)
CREATININE: 0.9 mg/dL (ref 0.44–1.00)
Glucose, Bld: 80 mg/dL (ref 70–99)
Potassium: 3.9 mmol/L (ref 3.5–5.1)
Sodium: 140 mmol/L (ref 135–145)
TOTAL PROTEIN: 8.1 g/dL (ref 6.5–8.1)

## 2018-03-25 LAB — LIPID PANEL
CHOLESTEROL: 173 mg/dL (ref 0–200)
HDL: 48 mg/dL (ref >40)
LDL Cholesterol: 107 mg/dL — ABNORMAL HIGH (ref 0–99)
TRIGLYCERIDES: 91 mg/dL (ref <150)
Total CHOL/HDL Ratio: 3.6 RATIO
VLDL: 18 mg/dL (ref 0–40)

## 2018-03-25 LAB — CBC
HEMATOCRIT: 39.8 % (ref 36.0–46.0)
HEMOGLOBIN: 12.6 g/dL (ref 12.0–15.0)
MCH: 28.1 pg (ref 26.0–34.0)
MCHC: 31.7 g/dL (ref 30.0–36.0)
MCV: 88.6 fL (ref 78.0–100.0)
Platelets: 221 10*3/uL (ref 150–400)
RBC: 4.49 MIL/uL (ref 3.87–5.11)
RDW: 14.3 % (ref 11.5–15.5)
WBC: 4.3 10*3/uL (ref 4.0–10.5)

## 2018-03-25 LAB — TSH: TSH: 2.307 u[IU]/mL (ref 0.350–4.500)

## 2018-04-24 DIAGNOSIS — F321 Major depressive disorder, single episode, moderate: Secondary | ICD-10-CM | POA: Diagnosis not present

## 2018-04-24 DIAGNOSIS — J301 Allergic rhinitis due to pollen: Secondary | ICD-10-CM | POA: Diagnosis not present

## 2018-04-24 DIAGNOSIS — I10 Essential (primary) hypertension: Secondary | ICD-10-CM | POA: Diagnosis not present

## 2018-04-24 DIAGNOSIS — I2510 Atherosclerotic heart disease of native coronary artery without angina pectoris: Secondary | ICD-10-CM | POA: Diagnosis not present

## 2018-08-10 ENCOUNTER — Other Ambulatory Visit: Payer: Self-pay | Admitting: Cardiology

## 2018-09-15 DIAGNOSIS — I2510 Atherosclerotic heart disease of native coronary artery without angina pectoris: Secondary | ICD-10-CM | POA: Diagnosis not present

## 2018-09-15 DIAGNOSIS — K648 Other hemorrhoids: Secondary | ICD-10-CM | POA: Diagnosis not present

## 2018-09-15 DIAGNOSIS — I10 Essential (primary) hypertension: Secondary | ICD-10-CM | POA: Diagnosis not present

## 2018-09-15 DIAGNOSIS — R21 Rash and other nonspecific skin eruption: Secondary | ICD-10-CM | POA: Diagnosis not present

## 2018-09-19 ENCOUNTER — Encounter: Payer: Self-pay | Admitting: Obstetrics & Gynecology

## 2018-09-19 ENCOUNTER — Encounter: Payer: Self-pay | Admitting: *Deleted

## 2018-10-14 ENCOUNTER — Encounter: Payer: Self-pay | Admitting: Obstetrics & Gynecology

## 2018-10-19 ENCOUNTER — Other Ambulatory Visit: Payer: Self-pay

## 2018-10-19 ENCOUNTER — Encounter (HOSPITAL_COMMUNITY): Payer: Self-pay | Admitting: Emergency Medicine

## 2018-10-19 ENCOUNTER — Emergency Department (HOSPITAL_COMMUNITY)
Admission: EM | Admit: 2018-10-19 | Discharge: 2018-10-20 | Disposition: A | Discharge status: Home / Self Care | Payer: BLUE CROSS/BLUE SHIELD | Attending: Emergency Medicine | Admitting: Emergency Medicine

## 2018-10-19 DIAGNOSIS — I2510 Atherosclerotic heart disease of native coronary artery without angina pectoris: Secondary | ICD-10-CM | POA: Diagnosis not present

## 2018-10-19 DIAGNOSIS — Z87891 Personal history of nicotine dependence: Secondary | ICD-10-CM | POA: Diagnosis not present

## 2018-10-19 DIAGNOSIS — Z951 Presence of aortocoronary bypass graft: Secondary | ICD-10-CM | POA: Diagnosis not present

## 2018-10-19 DIAGNOSIS — I10 Essential (primary) hypertension: Secondary | ICD-10-CM | POA: Insufficient documentation

## 2018-10-19 DIAGNOSIS — R221 Localized swelling, mass and lump, neck: Secondary | ICD-10-CM

## 2018-10-19 DIAGNOSIS — J029 Acute pharyngitis, unspecified: Secondary | ICD-10-CM | POA: Diagnosis not present

## 2018-10-19 MED ORDER — LIDOCAINE VISCOUS HCL 2 % MT SOLN
15.0000 mL | Freq: Once | OROMUCOSAL | Status: AC
Start: 2018-10-19 — End: 2018-10-19
  Administered 2018-10-19: 15 mL via OROMUCOSAL
  Filled 2018-10-19: qty 15

## 2018-10-19 MED ORDER — KETOROLAC TROMETHAMINE 60 MG/2ML IM SOLN
60.0000 mg | Freq: Once | INTRAMUSCULAR | Status: AC
  Administered 2018-10-19: 60 mg via INTRAMUSCULAR
  Filled 2018-10-19: qty 2

## 2018-10-19 MED ORDER — DEXAMETHASONE 4 MG PO TABS
10.0000 mg | ORAL_TABLET | Freq: Once | ORAL | Status: AC
  Administered 2018-10-19: 10 mg via ORAL
  Filled 2018-10-19: qty 3

## 2018-10-19 NOTE — ED Triage Notes (Signed)
Sore throat and cough x 4 days. Patient has traveled to [place]  And was there for 16 days.

## 2018-10-20 ENCOUNTER — Encounter: Payer: BLUE CROSS/BLUE SHIELD | Admitting: Obstetrics and Gynecology

## 2018-10-20 LAB — GROUP A STREP BY PCR: GROUP A STREP BY PCR: NOT DETECTED

## 2018-10-20 NOTE — ED Provider Notes (Signed)
Emergency Department Provider Note   I have reviewed the triage vital signs and the nursing notes.   HISTORY  Chief Complaint Sore Throat (cough)   HPI Carolyn Gaines is a 53 y.o. female with past medical history as documented below who presents the emergency department today with 3 to 4 days of throat pain.  She also told triage that she had a cough that she states is only been 1 or 2 coughs a day but nothing persistent.  She is not had any fevers.  States the pain seems to worsen the left and her daughter had strep throat a few weeks ago so she presents here for further evaluation.  No shortness of breath, muscle aches, body aches or exposure to novel coronavirus.  She did recently travel to [place].  She tried taking 10 leftover pills of amoxicillin that she had without relief.  She consulted her pharmacist for medical recommendations when tried multiple over-the-counter remedies without help. No other associated or modifying symptoms.    Past Medical History:  Diagnosis Date  . Anemia    low iron  . Anxiety   . Arthritis   . Coronary artery disease   . Depression   . Fibromyalgia   . Gallstones   . GERD (gastroesophageal reflux disease)   . Headache    migraines  . MI (myocardial infarction) (HCC) 01/2016  . Peptic ulcer   . Seizures (HCC)    as a baby    Patient Active Problem List   Diagnosis Date Noted  . Calculus of gallbladder without cholecystitis without obstruction   . 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 Unstable angina (HCC)   . Fibromyalgia 10/20/2016  . Essential hypertension 07/18/2016  . Psoriasis 07/18/2016  . Precordial chest pain 07/03/2016  . Depression with anxiety 07/03/2016  . Normocytic anemia 07/03/2016  . History of acute inferior wall MI 02/19/2016  . Coronary artery disease involving native heart without angina pectoris 02/19/2016    Past Surgical History:  Procedure Laterality Date  . ABDOMINAL HYSTERECTOMY    . CARDIAC  CATHETERIZATION N/A 02/19/2016   Procedure: Left Heart Cath and Coronary Angiography;  Surgeon: Yates Decamp, MD;  Location: [facility] INVASIVE CV LAB;  Service: Cardiovascular;  Laterality: N/A;  . CARDIAC CATHETERIZATION N/A 02/19/2016   Procedure: Coronary Stent Intervention;  Surgeon: Yates Decamp, MD;  Location: [facility] INVASIVE CV LAB;  Service: Cardiovascular;  Laterality: N/A;  . CESAREAN SECTION     x 2  . CHOLECYSTECTOMY N/A 01/25/2017   Procedure: LAPAROSCOPIC CHOLECYSTECTOMY;  Surgeon: Franky Macho, MD;  Location: [facility] ORS;  Service: General;  Laterality: N/A;  . COLONOSCOPY    . CORONARY ARTERY BYPASS GRAFT N/A 10/26/2016   Procedure: CORONARY ARTERY BYPASS GRAFTING (CABG), ON PUMP, TIMES THREE, USING LEFT INTERNAL MAMMARY ARTERY AND ENDOSCOPICALLY HARVESTED RIGHT GREATER SAPHENOUS VEIN;  Surgeon: Alleen Borne, MD;  Location: [facility] OR;  Service: Open Heart Surgery;  Laterality: N/A;  LIMA-LAD SVG-RCA SVG-OM  . ESOPHAGOGASTRODUODENOSCOPY    . LEFT HEART CATH AND CORONARY ANGIOGRAPHY N/A 10/22/2016   Procedure: Left Heart Cath and Coronary Angiography;  Surgeon: Lennette Bihari, MD;  Location: [facility] INVASIVE CV LAB;  Service: Cardiovascular;  Laterality: N/A;  . STERNAL WIRES REMOVAL N/A 07/11/2017   Procedure: STERNAL WIRES REMOVAL;  Surgeon: Alleen Borne, MD;  Location: [facility] OR;  Service: Thoracic;  Laterality: N/A;  . TEE WITHOUT CARDIOVERSION N/A 10/26/2016   Procedure: TRANSESOPHAGEAL ECHOCARDIOGRAM (TEE);  Surgeon: Alleen Borne, MD;  Location: [facility] OR;  Service: Open Heart Surgery;  Laterality: N/A;  . WRIST FRACTURE SURGERY Left    3 times    Current Outpatient Rx  . Order #: [id number] Class: Historical Med  . Order #: [id number] Class: Normal  . Order #: [id number] Class: Print  . Order #: [id number] Class: Print  . Order #: [id number] Class: Normal  . Order #: [id number] Class: Print  . Order #: [id number] Class: Print  . Order #: [id number] Class: Print  . Order #: [id number] Class: Normal  . Order #:  [id number] Class: Print  . Order #: [id number] Class: Normal  . Order #: [id number] Class: Print  . Order #: [id number] Class: Historical Med  . Order #: [id number] Class: Normal  . Order #: [id number] Class: Historical Med    Allergies Bee venom; Adhesive [tape]; and Triple antibiotic [bacitracin-neomycin-polymyxin]  Family History  Problem Relation Age of Onset  . COPD Mother   . Arthritis Mother   . Heart disease Mother   . Stroke Mother   . Psoriasis Father   . Heart disease Father   . Diabetes Maternal Grandmother   . Diabetes Maternal Aunt   . Diabetes Maternal Aunt     Social History Social History   Tobacco Use  . Smoking status: Former Smoker    Last attempt to quit: 03/21/2011    Years since quitting: 7.5  . Smokeless tobacco: Never Used  Substance Use Topics  . Alcohol use: Yes    Comment: rare  . Drug use: No    Review of Systems  All other systems negative except as documented in the HPI. All pertinent positives and negatives as reviewed in the HPI. ____________________________________________   PHYSICAL EXAM:  VITAL SIGNS: ED Triage Vitals  Enc Vitals Group     BP 10/19/18 2249 123/78     Pulse Rate 10/19/18 2249 97     Resp 10/19/18 2249 18     Temp 10/19/18 2249 99.6 F (37.6 C)     Temp Source 10/19/18 2249 Oral     SpO2 10/19/18 2249 100 %     Weight 10/19/18 2250 165 lb (74.8 kg)     Height 10/19/18 2250 5\' 3"  (1.6 m)    Constitutional: Alert and oriented. Well appearing and in no acute distress. Eyes: Conjunctivae are normal. PERRL. EOMI. Head: Atraumatic. Nose: No congestion/rhinnorhea. Mouth/Throat: Mucous membranes are moist.  Oropharynx non-erythematous. Neck: No stridor.  No meningeal signs.  ttp left submandibular area. Cardiovascular: Normal rate, regular rhythm. Good peripheral circulation. Grossly normal heart sounds.   Respiratory: Normal respiratory effort.  No retractions. Lungs CTAB. Gastrointestinal: Soft and nontender. No  distention.  Musculoskeletal: No lower extremity tenderness nor edema. No gross deformities of extremities. Neurologic:  Normal speech and language. No gross focal neurologic deficits are appreciated.  Skin:  Skin is warm, dry and intact. No rash noted.   ____________________________________________   LABS (all labs ordered are listed, but only abnormal results are displayed)  Labs Reviewed  GROUP A STREP BY PCR   ____________________________________________   INITIAL IMPRESSION / ASSESSMENT AND PLAN / ED COURSE  sialadenolithiasis vs lymphadenopathy. Strep negative. No fever/body aches/cough/nausea to suggest influenza. Not covid high risk. No e/o airway or esophageal restriction. No deep neck space infection. Symptomatic treatment and PCP follow up suggested.   Pertinent labs & imaging results that were available during my care of the patient were reviewed by me and considered in my medical decision making (see chart for details).  ____________________________________________  FINAL CLINICAL IMPRESSION(S) / ED DIAGNOSES  Final diagnoses:  Acute  pharyngitis, unspecified etiology  Neck swelling     MEDICATIONS GIVEN DURING THIS VISIT:  Medications  dexamethasone (DECADRON) tablet 10 mg (10 mg Oral Given 10/19/18 2353)  ketorolac (TORADOL) injection 60 mg (60 mg Intramuscular Given 10/19/18 2355)  lidocaine (XYLOCAINE) 2 % viscous mouth solution 15 mL (15 mLs Mouth/Throat Given 10/19/18 2355)     NEW OUTPATIENT MEDICATIONS STARTED DURING THIS VISIT:  Discharge Medication List as of 10/20/2018 12:18 AM      Note:  This note was prepared with assistance of Dragon voice recognition software. Occasional wrong-word or sound-a-like substitutions may have occurred due to the inherent limitations of voice recognition software.   Janice Seales, Barbara Cower, MD 10/20/18 [phone]

## 2018-10-23 ENCOUNTER — Telehealth: Payer: Self-pay | Admitting: *Deleted

## 2018-10-23 NOTE — Telephone Encounter (Signed)
Called patient to discuss restrictions, heard message that vm is not set up.

## 2018-10-24 ENCOUNTER — Encounter: Payer: Self-pay | Admitting: Obstetrics & Gynecology

## 2018-10-24 ENCOUNTER — Ambulatory Visit (INDEPENDENT_AMBULATORY_CARE_PROVIDER_SITE_OTHER): Payer: BLUE CROSS/BLUE SHIELD | Admitting: Obstetrics & Gynecology

## 2018-10-24 ENCOUNTER — Other Ambulatory Visit: Payer: Self-pay

## 2018-10-24 VITALS — BP 104/66 | HR 79 | Ht 64.0 in | Wt 184.0 lb

## 2018-10-24 DIAGNOSIS — A630 Anogenital (venereal) warts: Secondary | ICD-10-CM | POA: Diagnosis not present

## 2018-10-24 MED ORDER — TRICHLOROACETIC ACID 80 % EX LIQD: 1.0000 | Freq: Once | CUTANEOUS | 1 refills | Status: AC

## 2018-10-24 NOTE — Progress Notes (Signed)
Chief Complaint  Patient presents with  . Referral    lesions in vagina      53 y.o. G2P2 No LMP recorded. Patient has had a hysterectomy. The current method of family planning is status post hysterectomy.  Outpatient Encounter Medications as of 10/24/2018  Medication Sig  . acetaminophen (TYLENOL) 500 MG tablet Take 1,000 mg by mouth every 6 (six) hours as needed for mild pain.  Marland Kitchen aspirin EC 81 MG EC tablet Take 1 tablet (81 mg total) by mouth daily. (Patient not taking: Reported on 11/29/2017)  . azithromycin (ZITHROMAX Z-PAK) 250 MG tablet Take 2 tablets by mouth on day one followed by one tablet daily for 4 days. (Patient not taking: Reported on 11/29/2017)  . benzonatate (TESSALON) 100 MG capsule Take 1 capsule (100 mg total) by mouth 3 (three) times daily as needed for cough. (Patient not taking: Reported on 11/29/2017)  . carvedilol (COREG) 3.125 MG tablet Take 1 tablet (3.125 mg total) by mouth 2 (two) times daily with a meal.  . chlorpheniramine-HYDROcodone (TUSSIONEX PENNKINETIC ER) 10-8 MG/5ML SUER Take 5 mLs by mouth every 12 (twelve) hours as needed for cough.  . cyclobenzaprine (FLEXERIL) 10 MG tablet Take 1 tablet (10 mg total) by mouth 3 (three) times daily.  Marland Kitchen dexamethasone (DECADRON) 4 MG tablet Take 1 tablet (4 mg total) by mouth 2 (two) times daily with a meal.  . escitalopram (LEXAPRO) 20 MG tablet Take 1 tablet (20 mg total) by mouth daily.  Marland Kitchen lidocaine (LIDODERM) 5 % Place 1 patch onto the skin daily. Remove & Discard patch within 12 hours or as directed by MD  . nitroGLYCERIN (NITROSTAT) 0.4 MG SL tablet PLACE 1 TABLET UNDER THE TONGUE EVERY 5 MINUTES FOR 3 DOSES AS NEEDED FOR CHEST PAIN  . ondansetron (ZOFRAN-ODT) 4 MG disintegrating tablet Take 1 tablet (4 mg total) by mouth every 6 (six) hours as needed for nausea or vomiting.  . pantoprazole (PROTONIX) 40 MG tablet Take 40 mg by mouth at bedtime.  . rosuvastatin (CRESTOR) 40 MG tablet TAKE 1 TABLET(40 MG) BY  MOUTH DAILY  . traMADol (ULTRAM) 50 MG tablet Take 50 mg by mouth every 6 (six) hours as needed for severe pain.  Marland Kitchen trichloroacetic acid 80 % LIQD Apply 1 application topically once for 1 dose. To be brought to office provider application   Facility-Administered Encounter Medications as of 10/24/2018  Medication  . 0.9 %  sodium chloride infusion    Subjective Pt has bumps on bottom for 1 month Asymptomatic No other complaints Past Medical History:  Diagnosis Date  . Anemia    low iron  . Anxiety   . Arthritis   . Coronary artery disease   . Depression   . Fibromyalgia   . Gallstones   . GERD (gastroesophageal reflux disease)   . Headache    migraines  . MI (myocardial infarction) (HCC) 01/2016  . Peptic ulcer   . Seizures (HCC)    as a baby    Past Surgical History:  Procedure Laterality Date  . ABDOMINAL HYSTERECTOMY    . CARDIAC CATHETERIZATION N/A 02/19/2016   Procedure: Left Heart Cath and Coronary Angiography;  Surgeon: Yates Decamp, MD;  Location: [facility] INVASIVE CV LAB;  Service: Cardiovascular;  Laterality: N/A;  . CARDIAC CATHETERIZATION N/A 02/19/2016   Procedure: Coronary Stent Intervention;  Surgeon: Yates Decamp, MD;  Location: [facility] INVASIVE CV LAB;  Service: Cardiovascular;  Laterality: N/A;  . CESAREAN SECTION  x 2  . CHOLECYSTECTOMY N/A 01/25/2017   Procedure: LAPAROSCOPIC CHOLECYSTECTOMY;  Surgeon: Franky Macho, MD;  Location: [facility] ORS;  Service: General;  Laterality: N/A;  . COLONOSCOPY    . CORONARY ARTERY BYPASS GRAFT N/A 10/26/2016   Procedure: CORONARY ARTERY BYPASS GRAFTING (CABG), ON PUMP, TIMES THREE, USING LEFT INTERNAL MAMMARY ARTERY AND ENDOSCOPICALLY HARVESTED RIGHT GREATER SAPHENOUS VEIN;  Surgeon: Alleen Borne, MD;  Location: [facility] OR;  Service: Open Heart Surgery;  Laterality: N/A;  LIMA-LAD SVG-RCA SVG-OM  . ESOPHAGOGASTRODUODENOSCOPY    . LEFT HEART CATH AND CORONARY ANGIOGRAPHY N/A 10/22/2016   Procedure: Left Heart Cath and Coronary Angiography;   Surgeon: Lennette Bihari, MD;  Location: [facility] INVASIVE CV LAB;  Service: Cardiovascular;  Laterality: N/A;  . STERNAL WIRES REMOVAL N/A 07/11/2017   Procedure: STERNAL WIRES REMOVAL;  Surgeon: Alleen Borne, MD;  Location: [facility] OR;  Service: Thoracic;  Laterality: N/A;  . TEE WITHOUT CARDIOVERSION N/A 10/26/2016   Procedure: TRANSESOPHAGEAL ECHOCARDIOGRAM (TEE);  Surgeon: Alleen Borne, MD;  Location: [facility] OR;  Service: Open Heart Surgery;  Laterality: N/A;  . WRIST FRACTURE SURGERY Left    3 times    OB History    Gravida  2   Para  2   Term      Preterm      AB      Living  2     SAB      TAB      Ectopic      Multiple      Live Births              Allergies  Allergen Reactions  . Bee Venom Other (See Comments)    Pus sites at injection  . Elastic Bandages & [Zinc] Hives  . Adhesive [Tape] Itching and Swelling    Please use paper tape  . Triple Antibiotic [Bacitracin-Neomycin-Polymyxin] Rash    Social History   Socioeconomic History  . Marital status: Widowed    Spouse name: Not on file  . Number of children: 2  . Years of education: Not on file  . Highest education level: Not on file  Occupational History  . Occupation: Education officer, community  Social Needs  . Financial resource strain: Not on file  . Food insecurity:    Worry: Not on file    Inability: Not on file  . Transportation needs:    Medical: Not on file    Non-medical: Not on file  Tobacco Use  . Smoking status: Former Smoker    Last attempt to quit: 03/21/2011    Years since quitting: 7.6  . Smokeless tobacco: Never Used  Substance and Sexual Activity  . Alcohol use: Yes    Comment: rare  . Drug use: No  . Sexual activity: Yes    Birth control/protection: Surgical  Lifestyle  . Physical activity:    Days per week: Not on file    Minutes per session: Not on file  . Stress: Not on file  Relationships  . Social connections:    Talks on phone: Not on file    Gets together: Not  on file    Attends religious service: Not on file    Active member of club or organization: Not on file    Attends meetings of clubs or organizations: Not on file    Relationship status: Not on file  Other Topics Concern  . Not on file  Social History Narrative  . Not on  file    Family History  Problem Relation Age of Onset  . COPD Mother   . Arthritis Mother   . Heart disease Mother   . Stroke Mother   . Psoriasis Father   . Heart disease Father   . Diabetes Maternal Grandmother   . Diabetes Maternal Aunt   . Diabetes Maternal Aunt     Medications:       Current Outpatient Medications:  .  acetaminophen (TYLENOL) 500 MG tablet, Take 1,000 mg by mouth every 6 (six) hours as needed for mild pain., Disp: , Rfl:  .  aspirin EC 81 MG EC tablet, Take 1 tablet (81 mg total) by mouth daily. (Patient not taking: Reported on 11/29/2017), Disp: 30 tablet, Rfl: 11 .  azithromycin (ZITHROMAX Z-PAK) 250 MG tablet, Take 2 tablets by mouth on day one followed by one tablet daily for 4 days. (Patient not taking: Reported on 11/29/2017), Disp: 6 each, Rfl: 0 .  benzonatate (TESSALON) 100 MG capsule, Take 1 capsule (100 mg total) by mouth 3 (three) times daily as needed for cough. (Patient not taking: Reported on 11/29/2017), Disp: 21 capsule, Rfl: 0 .  carvedilol (COREG) 3.125 MG tablet, Take 1 tablet (3.125 mg total) by mouth 2 (two) times daily with a meal., Disp: 180 tablet, Rfl: 3 .  chlorpheniramine-HYDROcodone (TUSSIONEX PENNKINETIC ER) 10-8 MG/5ML SUER, Take 5 mLs by mouth every 12 (twelve) hours as needed for cough., Disp: 75 mL, Rfl: 0 .  cyclobenzaprine (FLEXERIL) 10 MG tablet, Take 1 tablet (10 mg total) by mouth 3 (three) times daily., Disp: 20 tablet, Rfl: 0 .  dexamethasone (DECADRON) 4 MG tablet, Take 1 tablet (4 mg total) by mouth 2 (two) times daily with a meal., Disp: 10 tablet, Rfl: 0 .  escitalopram (LEXAPRO) 20 MG tablet, Take 1 tablet (20 mg total) by mouth daily., Disp: 30 tablet,  Rfl: 0 .  lidocaine (LIDODERM) 5 %, Place 1 patch onto the skin daily. Remove & Discard patch within 12 hours or as directed by MD, Disp: 30 patch, Rfl: 0 .  nitroGLYCERIN (NITROSTAT) 0.4 MG SL tablet, PLACE 1 TABLET UNDER THE TONGUE EVERY 5 MINUTES FOR 3 DOSES AS NEEDED FOR CHEST PAIN, Disp: 25 tablet, Rfl: 3 .  ondansetron (ZOFRAN-ODT) 4 MG disintegrating tablet, Take 1 tablet (4 mg total) by mouth every 6 (six) hours as needed for nausea or vomiting., Disp: 10 tablet, Rfl: 0 .  pantoprazole (PROTONIX) 40 MG tablet, Take 40 mg by mouth at bedtime., Disp: , Rfl: 12 .  rosuvastatin (CRESTOR) 40 MG tablet, TAKE 1 TABLET(40 MG) BY MOUTH DAILY, Disp: 90 tablet, Rfl: 3 .  traMADol (ULTRAM) 50 MG tablet, Take 50 mg by mouth every 6 (six) hours as needed for severe pain., Disp: , Rfl:  .  trichloroacetic acid 80 % LIQD, Apply 1 application topically once for 1 dose. To be brought to office provider application, Disp: 1 Bottle, Rfl: 1  Current Facility-Administered Medications:  .  0.9 %  sodium chloride infusion, 500 mL, Intravenous, Once, Hilarie Fredrickson, MD  Objective Blood pressure 104/66, pulse 79, height 5\' 4"  (1.626 m), weight 184 lb (83.5 kg).  General WDWN female NAD Vulva:  6-8 warts seen periclitoral, right vulva perieum/forchette Vagina:  normal mucosa, no discharge Cervix:  absent Uterus:  uterus absent Adnexa: ovaries:not present,     Pertinent ROS No burning with urination, frequency or urgency No nausea, vomiting or diarrhea Nor fever chills or other constitutional symptoms  Labs or studies     Impression Diagnoses this Encounter::   ICD-10-CM   1. Genital warts A63.0     Established relevant diagnosis(es):   Plan/Recommendations: Meds ordered this encounter  Medications  . trichloroacetic acid 80 % LIQD    Sig: Apply 1 application topically once for 1 dose. To be brought to office provider application    Dispense:  1 Bottle    Refill:  1    Labs or Scans  Ordered: No orders of the defined types were placed in this encounter.   Management:: >TCA therapy multiple treatments will be needed  Follow up Return in about 1 week (around 10/31/2018) for Follow up, with Dr Despina Hidden, in office.        All questions were answered.

## 2018-10-27 ENCOUNTER — Ambulatory Visit: Payer: BLUE CROSS/BLUE SHIELD | Admitting: Cardiology

## 2018-10-31 ENCOUNTER — Telehealth: Payer: Self-pay | Admitting: *Deleted

## 2018-10-31 NOTE — Telephone Encounter (Signed)

## 2018-11-03 ENCOUNTER — Other Ambulatory Visit: Payer: Self-pay

## 2018-11-03 ENCOUNTER — Encounter: Payer: Self-pay | Admitting: Obstetrics & Gynecology

## 2018-11-03 ENCOUNTER — Ambulatory Visit: Payer: BLUE CROSS/BLUE SHIELD | Admitting: Obstetrics & Gynecology

## 2018-11-03 VITALS — BP 114/72 | HR 81 | Temp 98.2°F | Ht 63.0 in | Wt 184.5 lb

## 2018-11-03 DIAGNOSIS — A630 Anogenital (venereal) warts: Secondary | ICD-10-CM

## 2018-11-03 NOTE — Progress Notes (Signed)
Chief Complaint  Patient presents with  . Follow-up    TCA treatment      53 y.o. G2P2 No LMP recorded. Patient has had a hysterectomy. The current method of family planning is status post hysterectomy.  Outpatient Encounter Medications as of 11/03/2018  Medication Sig  . acetaminophen (TYLENOL) 500 MG tablet Take 1,000 mg by mouth every 6 (six) hours as needed for mild pain.  . carvedilol (COREG) 3.125 MG tablet Take 1 tablet (3.125 mg total) by mouth 2 (two) times daily with a meal.  . escitalopram (LEXAPRO) 20 MG tablet Take 1 tablet (20 mg total) by mouth daily.  Marland Kitchen lidocaine (LIDODERM) 5 % Place 1 patch onto the skin daily. Remove & Discard patch within 12 hours or as directed by MD  . nitroGLYCERIN (NITROSTAT) 0.4 MG SL tablet PLACE 1 TABLET UNDER THE TONGUE EVERY 5 MINUTES FOR 3 DOSES AS NEEDED FOR CHEST PAIN  . pantoprazole (PROTONIX) 40 MG tablet Take 40 mg by mouth at bedtime.  . rosuvastatin (CRESTOR) 40 MG tablet TAKE 1 TABLET(40 MG) BY MOUTH DAILY  . traMADol (ULTRAM) 50 MG tablet Take 50 mg by mouth every 6 (six) hours as needed for severe pain.  . [DISCONTINUED] aspirin EC 81 MG EC tablet Take 1 tablet (81 mg total) by mouth daily. (Patient not taking: Reported on 11/29/2017)  . [DISCONTINUED] azithromycin (ZITHROMAX Z-PAK) 250 MG tablet Take 2 tablets by mouth on day one followed by one tablet daily for 4 days. (Patient not taking: Reported on 11/29/2017)  . [DISCONTINUED] benzonatate (TESSALON) 100 MG capsule Take 1 capsule (100 mg total) by mouth 3 (three) times daily as needed for cough. (Patient not taking: Reported on 11/29/2017)  . [DISCONTINUED] chlorpheniramine-HYDROcodone (TUSSIONEX PENNKINETIC ER) 10-8 MG/5ML SUER Take 5 mLs by mouth every 12 (twelve) hours as needed for cough.  . [DISCONTINUED] cyclobenzaprine (FLEXERIL) 10 MG tablet Take 1 tablet (10 mg total) by mouth 3 (three) times daily.  . [DISCONTINUED] dexamethasone (DECADRON) 4 MG tablet Take 1 tablet (4  mg total) by mouth 2 (two) times daily with a meal.  . [DISCONTINUED] ondansetron (ZOFRAN-ODT) 4 MG disintegrating tablet Take 1 tablet (4 mg total) by mouth every 6 (six) hours as needed for nausea or vomiting.   Facility-Administered Encounter Medications as of 11/03/2018  Medication  . 0.9 %  sodium chloride infusion    Subjective Pt is here for treatment of her genital warts diagnosed last week Past Medical History:  Diagnosis Date  . Anemia    low iron  . Anxiety   . Arthritis   . Coronary artery disease   . Depression   . Fibromyalgia   . Gallstones   . GERD (gastroesophageal reflux disease)   . Headache    migraines  . MI (myocardial infarction) (HCC) 01/2016  . Peptic ulcer   . Seizures (HCC)    as a baby    Past Surgical History:  Procedure Laterality Date  . ABDOMINAL HYSTERECTOMY    . CARDIAC CATHETERIZATION N/A 02/19/2016   Procedure: Left Heart Cath and Coronary Angiography;  Surgeon: Yates Decamp, MD;  Location: [facility] INVASIVE CV LAB;  Service: Cardiovascular;  Laterality: N/A;  . CARDIAC CATHETERIZATION N/A 02/19/2016   Procedure: Coronary Stent Intervention;  Surgeon: Yates Decamp, MD;  Location: [facility] INVASIVE CV LAB;  Service: Cardiovascular;  Laterality: N/A;  . CESAREAN SECTION     x 2  . CHOLECYSTECTOMY N/A 01/25/2017   Procedure: LAPAROSCOPIC CHOLECYSTECTOMY;  Surgeon:  Franky Macho, MD;  Location: [facility] OR[facility];  Service: General;  Laterality: N/A;  . COLONOSCOPY    . CORONARY ARTERY BYPASS GRAFT N/A 10/26/2016   Procedure: CORONARY ARTERY BYPASS GRAFTING (CABG), ON PUMP, TIMES THREE, USING LEFT INTERNAL MAMMARY ARTERY AND ENDOSCOPICALLY HARVESTED RIGHT GREATER SAPHENOUS VEIN;  Surgeon: Alleen Borne, MD;  Location: [facility] OR;  Service: Open Heart Surgery;  Laterality: N/A;  LIMA-LAD SVG-RCA SVG-OM  . ESOPHAGOGASTRODUODENOSCOPY    . LEFT HEART CATH AND CORONARY ANGIOGRAPHY N/A 10/22/2016   Procedure: Left Heart Cath and Coronary Angiography;  Surgeon: Lennette Bihari, MD;   Location: [facility] INVASIVE CV LAB;  Service: Cardiovascular;  Laterality: N/A;  . STERNAL WIRES REMOVAL N/A 07/11/2017   Procedure: STERNAL WIRES REMOVAL;  Surgeon: Alleen Borne, MD;  Location: [facility] OR;  Service: Thoracic;  Laterality: N/A;  . TEE WITHOUT CARDIOVERSION N/A 10/26/2016   Procedure: TRANSESOPHAGEAL ECHOCARDIOGRAM (TEE);  Surgeon: Alleen Borne, MD;  Location[facility];  Service: Open Heart Surgery;  Laterality: N/A;  . WRIST FRACTURE SURGERY Left    3 times    OB History    Gravida  2   Para  2   Term      Preterm      AB      Living  2     SAB      TAB      Ectopic      Multiple      Live Births              Allergies  Allergen Reactions  . Bee Venom Other (See Comments)    Pus sites at injection  . Elastic Bandages & [Zinc] Hives  . Mango Flavor Hives    All mango  . Adhesive [Tape] Itching and Swelling    Please use paper tape  . Triple Antibiotic [Bacitracin-Neomycin-Polymyxin] Rash    Social History   Socioeconomic History  . Marital status: Widowed    Spouse name: Not on file  . Number of children: 2  . Years of education: Not on file  . Highest education level: Not on file  Occupational History  . Occupation: Education officer, community  Social Needs  . Financial resource strain: Not on file  . Food insecurity:    Worry: Not on file    Inability: Not on file  . Transportation needs:    Medical: Not on file    Non-medical: Not on file  Tobacco Use  . Smoking status: Former Smoker    Last attempt to quit: 03/21/2011    Years since quitting: 7.6  . Smokeless tobacco: Never Used  Substance and Sexual Activity  . Alcohol use: Yes    Comment: rare  . Drug use: No  . Sexual activity: Yes    Birth control/protection: Surgical    Comment: hyst  Lifestyle  . Physical activity:    Days per week: Not on file    Minutes per session: Not on file  . Stress: Not on file  Relationships  . Social connections:    Talks on phone: Not on file     Gets together: Not on file    Attends religious service: Not on file    Active member of club or organization: Not on file    Attends meetings of clubs or organizations: Not on file    Relationship status: Not on file  Other Topics Concern  . Not on file  Social History Narrative  . Not  on file    Family History  Problem Relation Age of Onset  . COPD Mother   . Arthritis Mother   . Heart disease Mother   . Stroke Mother   . Psoriasis Father   . Heart disease Father   . Diabetes Maternal Grandmother   . Diabetes Maternal Aunt   . Diabetes Maternal Aunt     Medications:       Current Outpatient Medications:  .  acetaminophen (TYLENOL) 500 MG tablet, Take 1,000 mg by mouth every 6 (six) hours as needed for mild pain., Disp: , Rfl:  .  carvedilol (COREG) 3.125 MG tablet, Take 1 tablet (3.125 mg total) by mouth 2 (two) times daily with a meal., Disp: 180 tablet, Rfl: 3 .  escitalopram (LEXAPRO) 20 MG tablet, Take 1 tablet (20 mg total) by mouth daily., Disp: 30 tablet, Rfl: 0 .  lidocaine (LIDODERM) 5 %, Place 1 patch onto the skin daily. Remove & Discard patch within 12 hours or as directed by MD, Disp: 30 patch, Rfl: 0 .  nitroGLYCERIN (NITROSTAT) 0.4 MG SL tablet, PLACE 1 TABLET UNDER THE TONGUE EVERY 5 MINUTES FOR 3 DOSES AS NEEDED FOR CHEST PAIN, Disp: 25 tablet, Rfl: 3 .  pantoprazole (PROTONIX) 40 MG tablet, Take 40 mg by mouth at bedtime., Disp: , Rfl: 12 .  rosuvastatin (CRESTOR) 40 MG tablet, TAKE 1 TABLET(40 MG) BY MOUTH DAILY, Disp: 90 tablet, Rfl: 3 .  traMADol (ULTRAM) 50 MG tablet, Take 50 mg by mouth every 6 (six) hours as needed for severe pain., Disp: , Rfl:   Current Facility-Administered Medications:  .  0.9 %  sodium chloride infusion, 500 mL, Intravenous, Once, Hilarie Fredrickson, MD  Objective Blood pressure 114/72, pulse 81, temperature 98.2 F (36.8 C), height 5\' 3"  (1.6 m), weight 184 lb 8 oz (83.7 kg).  8 total lesions are identified and TCA 85% is  applied to all the lesions Pt is given after care instructions to wash off in 6 hours  Pertinent ROS   Labs or studies     Impression Diagnoses this Encounter::   ICD-10-CM   1. Genital warts A63.0     Established relevant diagnosis(es):   Plan/Recommendations: No orders of the defined types were placed in this encounter.   Labs or Scans Ordered: No orders of the defined types were placed in this encounter.   Management:: >will follow up in 1 month for re evaluation of her genital warts For probable re application  Follow up Return in about 1 month (around 12/03/2018) for Follow up, with Dr Despina Hidden.        All questions were answered.

## 2018-12-01 ENCOUNTER — Ambulatory Visit: Payer: BLUE CROSS/BLUE SHIELD | Admitting: Obstetrics and Gynecology

## 2018-12-04 ENCOUNTER — Ambulatory Visit: Payer: BLUE CROSS/BLUE SHIELD | Admitting: Obstetrics & Gynecology

## 2018-12-08 ENCOUNTER — Other Ambulatory Visit: Payer: Self-pay

## 2018-12-08 ENCOUNTER — Encounter: Payer: Self-pay | Admitting: Obstetrics & Gynecology

## 2018-12-08 ENCOUNTER — Ambulatory Visit (INDEPENDENT_AMBULATORY_CARE_PROVIDER_SITE_OTHER): Payer: BLUE CROSS/BLUE SHIELD | Admitting: Obstetrics & Gynecology

## 2018-12-08 VITALS — BP 116/73 | HR 70 | Ht 63.0 in | Wt 187.0 lb

## 2018-12-08 DIAGNOSIS — A630 Anogenital (venereal) warts: Secondary | ICD-10-CM

## 2018-12-08 NOTE — Progress Notes (Signed)
Chief Complaint  Patient presents with  . Follow-up    genital warts      53 y.o. G2P2 No LMP recorded. Patient has had a hysterectomy. The current method of family planning is n/a.  Outpatient Encounter Medications as of 12/08/2018  Medication Sig  . acetaminophen (TYLENOL) 500 MG tablet Take 1,000 mg by mouth every 6 (six) hours as needed for mild pain.  . carvedilol (COREG) 3.125 MG tablet Take 1 tablet (3.125 mg total) by mouth 2 (two) times daily with a meal.  . escitalopram (LEXAPRO) 20 MG tablet Take 1 tablet (20 mg total) by mouth daily.  Marland Kitchen. lidocaine (LIDODERM) 5 % Place 1 patch onto the skin daily. Remove & Discard patch within 12 hours or as directed by MD  . nitroGLYCERIN (NITROSTAT) 0.4 MG SL tablet PLACE 1 TABLET UNDER THE TONGUE EVERY 5 MINUTES FOR 3 DOSES AS NEEDED FOR CHEST PAIN  . pantoprazole (PROTONIX) 40 MG tablet Take 40 mg by mouth at bedtime.  . rosuvastatin (CRESTOR) 40 MG tablet TAKE 1 TABLET(40 MG) BY MOUTH DAILY  . traMADol (ULTRAM) 50 MG tablet Take 50 mg by mouth every 6 (six) hours as needed for severe pain.   Facility-Administered Encounter Medications as of 12/08/2018  Medication  . 0.9 %  sodium chloride infusion    Subjective Here for second treatment of genital warts Past Medical History:  Diagnosis Date  . Anemia    low iron  . Anxiety   . Arthritis   . Coronary artery disease   . Depression   . Fibromyalgia   . Gallstones   . GERD (gastroesophageal reflux disease)   . Headache    migraines  . MI (myocardial infarction) (HCC) 01/2016  . Peptic ulcer   . Seizures (HCC)    as a baby    Past Surgical History:  Procedure Laterality Date  . ABDOMINAL HYSTERECTOMY    . CARDIAC CATHETERIZATION N/A 02/19/2016   Procedure: Left Heart Cath and Coronary Angiography;  Surgeon: Yates DecampJay Ganji, MD;  [facility];  Service: Cardiovascular;  Laterality: N/A;  . CARDIAC CATHETERIZATION N/A 02/19/2016   Procedure: Coronary Stent  Intervention;  Surgeon: Yates DecampJay Ganji, MD;  [facility];  Service: Cardiovascular;  Laterality: N/A;  . CESAREAN SECTION     x 2  . CHOLECYSTECTOMY N/A 01/25/2017   Procedure: LAPAROSCOPIC CHOLECYSTECTOMY;  Surgeon: Franky MachoJenkins, Mark, MD;  Location: [facility];  Service: General;  Laterality: N/A;  . COLONOSCOPY    . CORONARY ARTERY BYPASS GRAFT N/A 10/26/2016   Procedure: CORONARY ARTERY BYPASS GRAFTING (CABG), ON PUMP, TIMES THREE, USING LEFT INTERNAL MAMMARY ARTERY AND ENDOSCOPICALLY HARVESTED RIGHT GREATER SAPHENOUS VEIN;  Surgeon: Alleen BorneBryan K Bartle, MD;  Location: [facility] OR;  Service: Open Heart Surgery;  Laterality: N/A;  LIMA-LAD SVG-RCA SVG-OM  . ESOPHAGOGASTRODUODENOSCOPY    . LEFT HEART CATH AND CORONARY ANGIOGRAPHY N/A 10/22/2016   Procedure: Left Heart Cath and Coronary Angiography;  Surgeon: Lennette Biharihomas A Kelly, MD;  [facility];  Service: Cardiovascular;  Laterality: N/A;  . STERNAL WIRES REMOVAL N/A 07/11/2017   Procedure: STERNAL WIRES REMOVAL;  Surgeon: Alleen BorneBartle, Bryan K, MD;  Location: [facility] OR;  Service: Thoracic;  Laterality: N/A;  . TEE WITHOUT CARDIOVERSION N/A 10/26/2016   Procedure: TRANSESOPHAGEAL ECHOCARDIOGRAM (TEE);  Surgeon: Alleen BorneBryan K Bartle, MD;  [facility];  Service: Open Heart Surgery;  Laterality: N/A;  . WRIST FRACTURE SURGERY Left    3 times  OB History    Gravida  2   Para  2   Term      Preterm      AB      Living  2     SAB      TAB      Ectopic      Multiple      Live Births              Allergies  Allergen Reactions  . Bee Venom Other (See Comments)    Pus sites at injection  . Elastic Bandages & [Zinc] Hives  . Mango Flavor Hives    All mango  . Adhesive [Tape] Itching and Swelling    Please use paper tape  . Triple Antibiotic [Bacitracin-Neomycin-Polymyxin] Rash    Social History   Socioeconomic History  . Marital status: Widowed    Spouse name: Not on file  . Number of children: 2  . Years of  education: Not on file  . Highest education level: Not on file  Occupational History  . Occupation: Education officer, community  Social Needs  . Financial resource strain: Not on file  . Food insecurity:    Worry: Not on file    Inability: Not on file  . Transportation needs:    Medical: Not on file    Non-medical: Not on file  Tobacco Use  . Smoking status: Former Smoker    Last attempt to quit: 03/21/2011    Years since quitting: 7.7  . Smokeless tobacco: Never Used  Substance and Sexual Activity  . Alcohol use: Yes    Comment: rare  . Drug use: No  . Sexual activity: Yes    Birth control/protection: Surgical    Comment: hyst  Lifestyle  . Physical activity:    Days per week: Not on file    Minutes per session: Not on file  . Stress: Not on file  Relationships  . Social connections:    Talks on phone: Not on file    Gets together: Not on file    Attends religious service: Not on file    Active member of club or organization: Not on file    Attends meetings of clubs or organizations: Not on file    Relationship status: Not on file  Other Topics Concern  . Not on file  Social History Narrative  . Not on file    Family History  Problem Relation Age of Onset  . COPD Mother   . Arthritis Mother   . Heart disease Mother   . Stroke Mother   . Psoriasis Father   . Heart disease Father   . Diabetes Maternal Grandmother   . Diabetes Maternal Aunt   . Diabetes Maternal Aunt     Medications:       Current Outpatient Medications:  .  acetaminophen (TYLENOL) 500 MG tablet, Take 1,000 mg by mouth every 6 (six) hours as needed for mild pain., Disp: , Rfl:  .  carvedilol (COREG) 3.125 MG tablet, Take 1 tablet (3.125 mg total) by mouth 2 (two) times daily with a meal., Disp: 180 tablet, Rfl: 3 .  escitalopram (LEXAPRO) 20 MG tablet, Take 1 tablet (20 mg total) by mouth daily., Disp: 30 tablet, Rfl: 0 .  lidocaine (LIDODERM) 5 %, Place 1 patch onto the skin daily. Remove &  Discard patch within 12 hours or as directed by MD, Disp: 30 patch, Rfl: 0 .  nitroGLYCERIN (NITROSTAT) 0.4 MG SL tablet,  PLACE 1 TABLET UNDER THE TONGUE EVERY 5 MINUTES FOR 3 DOSES AS NEEDED FOR CHEST PAIN, Disp: 25 tablet, Rfl: 3 .  pantoprazole (PROTONIX) 40 MG tablet, Take 40 mg by mouth at bedtime., Disp: , Rfl: 12 .  rosuvastatin (CRESTOR) 40 MG tablet, TAKE 1 TABLET(40 MG) BY MOUTH DAILY, Disp: 90 tablet, Rfl: 3 .  traMADol (ULTRAM) 50 MG tablet, Take 50 mg by mouth every 6 (six) hours as needed for severe pain., Disp: , Rfl:   Current Facility-Administered Medications:  .  0.9 %  sodium chloride infusion, 500 mL, Intravenous, Once, Hilarie Fredrickson, MD  Objective Blood pressure 116/73, pulse 70, height 5\' 3"  (1.6 m), weight 187 lb (84.8 kg).  Only 2 small areas remain after initial therapy  Treated with TCA 80% to the affected areas, the rest of the skin is well healed and without warts  Pertinent ROS No burning with urination, frequency or urgency No nausea, vomiting or diarrhea Nor fever chills or other constitutional symptoms   Labs or studies     Impression Diagnoses this Encounter::   ICD-10-CM   1. Genital warts A63.0     Established relevant diagnosis(es):   Plan/Recommendations: No orders of the defined types were placed in this encounter.   Labs or Scans Ordered: No orders of the defined types were placed in this encounter.   Management:: >follow up 1 month and may not need therapy at that time, great interval response  Follow up Return in about 1 month (around 01/08/2019) for Follow up, with Dr Despina Hidden.    All questions were answered.

## 2019-01-05 ENCOUNTER — Ambulatory Visit: Payer: BLUE CROSS/BLUE SHIELD | Admitting: Cardiology

## 2019-01-05 ENCOUNTER — Telehealth: Payer: Self-pay | Admitting: Cardiology

## 2019-01-05 NOTE — Telephone Encounter (Signed)

## 2019-01-07 ENCOUNTER — Telehealth: Payer: Self-pay | Admitting: *Deleted

## 2019-01-07 NOTE — Telephone Encounter (Signed)

## 2019-01-08 ENCOUNTER — Ambulatory Visit: Payer: BLUE CROSS/BLUE SHIELD | Admitting: Obstetrics & Gynecology

## 2019-01-09 ENCOUNTER — Telehealth (INDEPENDENT_AMBULATORY_CARE_PROVIDER_SITE_OTHER): Payer: BC Managed Care – PPO | Admitting: Cardiology

## 2019-01-09 ENCOUNTER — Encounter: Payer: Self-pay | Admitting: Cardiology

## 2019-01-09 ENCOUNTER — Telehealth: Payer: Self-pay | Admitting: Cardiology

## 2019-01-09 VITALS — Ht 63.0 in | Wt 180.0 lb

## 2019-01-09 DIAGNOSIS — E782 Mixed hyperlipidemia: Secondary | ICD-10-CM

## 2019-01-09 DIAGNOSIS — R0609 Other forms of dyspnea: Secondary | ICD-10-CM

## 2019-01-09 DIAGNOSIS — I2510 Atherosclerotic heart disease of native coronary artery without angina pectoris: Secondary | ICD-10-CM

## 2019-01-09 DIAGNOSIS — R0789 Other chest pain: Secondary | ICD-10-CM

## 2019-01-09 DIAGNOSIS — R0602 Shortness of breath: Secondary | ICD-10-CM

## 2019-01-09 DIAGNOSIS — R0600 Dyspnea, unspecified: Secondary | ICD-10-CM

## 2019-01-09 DIAGNOSIS — R030 Elevated blood-pressure reading, without diagnosis of hypertension: Secondary | ICD-10-CM

## 2019-01-09 NOTE — Progress Notes (Signed)
Virtual Visit via Telephone Note   This visit type was conducted due to national recommendations for restrictions regarding the COVID-19 Pandemic (e.g. social distancing) in an effort to limit this patient's exposure and mitigate transmission in our community.  Due to her co-morbid illnesses, this patient is at least at moderate risk for complications without adequate follow up.  This format is felt to be most appropriate for this patient at this time.  The patient did not have access to video technology/had technical difficulties with video requiring transitioning to audio format only (telephone).  All issues noted in this document were discussed and addressed.  No physical exam could be performed with this format.  Please refer to the patient's chart for her  consent to telehealth for [facility].   Date:  01/09/2019   ID:  Carolyn Gaines, DOB 1966-06-07, MRN [id number]  Patient Location: Home Provider Location: Office  PCP:  Sinda Du, MD  Cardiologist:  Dr Carlyle Dolly MD Electrophysiologist:  None   Evaluation Performed:  Follow-Up Visit  Chief Complaint:  Chest pain/SO  History of Present Illness:    Carolyn Gaines is a 53 y.o. female seen today for follow up of the following medical problems.   1. 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 Residual LAD disease 60-70% managed medically. - From Dr Irven Shelling notes nuclear stress 03/2016 (after stent) with mild septal ischemia, intermediate risk. - 09/2016 cath with 90% ostial LM. S/p cabg 10/16/16 wth LIMA-LAD, SVG-OM, SVG-RCA - 09/2016 echo LVEF 55-60%, no WMAs -Has not been on ACE-I due to soft bp's - had some issues with neuropathic pain at CABG site, did not tolerate gabapentin.   - chest pains at times. Burning pain midchest, worst with pressing on there. Can last a few hours. Most often brought on by lifting.  2. SOB - reports decreased energy, decreased stamina -DOE walking up flight a  stairs, which is new. - has been of meds for awhile due to depression - some recent edema. Up to 18 lbs since 05/2017.    3. Hyperlipidemia - had been off statin for some time, now just restarted a few weeks ago    SH: husband recently passed away, had been ill for long time. She has started hiking, doing things on her "bucket list" Works at pharmacy.    The patient does not have symptoms concerning for COVID-19 infection (fever, chills, cough, or new shortness of breath).    Past Medical History:  Diagnosis Date   Anemia    low iron   Anxiety    Arthritis    Coronary artery disease    Depression    Fibromyalgia    Gallstones    GERD (gastroesophageal reflux disease)    Headache    migraines   MI (myocardial infarction) ([place]) 01/2016   Peptic ulcer    Seizures ([place])    as a baby   Past Surgical History:  Procedure Laterality Date   ABDOMINAL HYSTERECTOMY     CARDIAC CATHETERIZATION N/A 02/19/2016   Procedure: Left Heart Cath and Coronary Angiography;  Surgeon: Adrian Prows, MD;  Location: [place] CV LAB;  Service: Cardiovascular;  Laterality: N/A;   CARDIAC CATHETERIZATION N/A 02/19/2016   Procedure: Coronary Stent Intervention;  Surgeon: Adrian Prows, MD;  Location: [place] CV LAB;  Service: Cardiovascular;  Laterality: N/A;   CESAREAN SECTION     x 2   CHOLECYSTECTOMY N/A 01/25/2017   Procedure: LAPAROSCOPIC CHOLECYSTECTOMY;  Surgeon:  Franky MachoJenkins, Mark, MD;  Location: [facility];  Service: General;  Laterality: N/A;   COLONOSCOPY     CORONARY ARTERY BYPASS GRAFT N/A 10/26/2016   Procedure: CORONARY ARTERY BYPASS GRAFTING (CABG), ON PUMP, TIMES THREE, USING LEFT INTERNAL MAMMARY ARTERY AND ENDOSCOPICALLY HARVESTED RIGHT GREATER SAPHENOUS VEIN;  Surgeon: Alleen BorneBryan K Bartle, MD;  Location: [facility] OR;  Service: Open Heart Surgery;  Laterality: N/A;  LIMA-LAD SVG-RCA SVG-OM   ESOPHAGOGASTRODUODENOSCOPY     LEFT HEART CATH AND CORONARY ANGIOGRAPHY N/A 10/22/2016     Procedure: Left Heart Cath and Coronary Angiography;  Surgeon: Lennette Biharihomas A Kelly, MD;  Location: [facility] INVASIVE CV LAB;  Service: Cardiovascular;  Laterality: N/A;   STERNAL WIRES REMOVAL N/A 07/11/2017   Procedure: STERNAL WIRES REMOVAL;  Surgeon: Alleen BorneBartle, Bryan K, MD;  Location: [facility] OR;  Service: Thoracic;  Laterality: N/A;   TEE WITHOUT CARDIOVERSION N/A 10/26/2016   Procedure: TRANSESOPHAGEAL ECHOCARDIOGRAM (TEE);  Surgeon: Alleen BorneBryan K Bartle, MD;  [facility];  Service: Open Heart Surgery;  Laterality: N/A;   WRIST FRACTURE SURGERY Left    3 times     No outpatient medications have been marked as taking for the 01/09/19 encounter (Appointment) with Antoine PocheBranch, Tevin Shillingford F, MD.   Current Facility-Administered Medications for the 01/09/19 encounter (Appointment) with Antoine PocheBranch, Buck Mcaffee F, MD  Medication   0.9 %  sodium chloride infusion     Allergies:   Bee venom, Elastic bandages & [zinc], Mango flavor, Adhesive [tape], and Triple antibiotic [bacitracin-neomycin-polymyxin]   Social History   Tobacco Use   Smoking status: Former Smoker    Quit date: 03/21/2011    Years since quitting: 7.8   Smokeless tobacco: Never Used  Substance Use Topics   Alcohol use: Yes    Comment: rare   Drug use: No     Family Hx: [name] family history includes Arthritis in her mother; COPD in her mother; Diabetes in her maternal aunt, maternal aunt, and maternal grandmother; Heart disease in her father and mother; Psoriasis in her father; Stroke in her mother.  ROS:   Please see the history of present illness.     All other systems reviewed and are negative.   Prior CV studies:   The following studies were reviewed today:  na  Labs/Other Tests and Data Reviewed:    EKG:  N/a   Recent Labs: 03/25/2018: ALT 13; BUN 12; Creatinine, Ser 0.90; Hemoglobin 12.6; Platelets 221; Potassium 3.9; Sodium 140; TSH 2.307   Recent Lipid Panel Lab Results  Component Value Date/Time   CHOL 173  03/25/2018 08:55 AM   TRIG 91 03/25/2018 08:55 AM   HDL 48 03/25/2018 08:55 AM   CHOLHDL 3.6 03/25/2018 08:55 AM   LDLCALC 107 (H) 03/25/2018 08:55 AM    Wt Readings from Last 3 Encounters:  12/08/18 187 lb (84.8 kg)  11/03/18 184 lb 8 oz (83.7 kg)  10/24/18 184 lb (83.5 kg)     Objective:    Vital Signs:  bp 135/90  Normal affect. Normal speech pattern and tone. Comfortable, no apparent distress. No audible signs of SOB or wheezing.   ASSESSMENT & PLAN:    1. CAD/Chest pain/SOB - recent chest pain sounds most consistent with her chronic chest wall pain she has had since CABG. Did not tolerate gabapentin. Did have sternal wires removed 07/11/2017 with some improvement - has had some new SOb/DOE over the last few months. Reports some LE edema - we will obtain an echo, pending results likely plan for nuclear  stress testing   2. Hyperlipidemia - repeat labs, she had been off statin for a few weeks but now back on   3. Elevated blood pressure - mildly elevated by home numbers, she will submit bp log in 1 week - would likely add low dose ACE-I pending bp's and labs.      COVID-19 Education: The signs and symptoms of COVID-19 were discussed with the patient and how to seek care for testing (follow up with PCP or arrange E-visit).  The importance of social distancing was discussed today.  Time:   Today, I have spent 20 minutes with the patient with telehealth technology discussing the above problems.     Medication Adjustments/Labs and Tests Ordered: Current medicines are reviewed at length with the patient today.  Concerns regarding medicines are outlined above.   Tests Ordered: No orders of the defined types were placed in this encounter.   Medication Changes: No orders of the defined types were placed in this encounter.   Follow Up:  Pending echo results Signed, Dina Rich, MD  01/09/2019 9:22 AM    [facility]

## 2019-01-09 NOTE — Telephone Encounter (Signed)
Pre-cert Verification for the following procedure    ECHO scheduled for 01-15-2019 at [facility] .

## 2019-01-09 NOTE — Addendum Note (Signed)
Addended by: Julian Hy T on: 01/09/2019 11:17 AM   Modules accepted: Orders

## 2019-01-09 NOTE — Patient Instructions (Signed)
Your physician recommends that you schedule a follow-up appointment in: PENDING TEST RESULTS FROM [facility]  Your physician recommends that you continue on your current medications as directed. Please refer to the Current Medication list given to you today.  Your physician has requested that you have an echocardiogram. Echocardiography is a painless test that uses sound waves to create images of your heart. It provides your doctor with information about the size and shape of your heart and how well your heart's chambers and valves are working. This procedure takes approximately one hour. There are no restrictions for this procedure.  Your physician has requested that you regularly monitor and record your blood pressure readings at home FOR 1 [place] ECHOCARDIOGRAM. Please use the same machine at the same time of day to   Your physician recommends that you return for lab work LIPIDS/CBC/BMP/TSH/MG/HGBA1C - PLEASE FAST 6-8 HOURS PRIOR TO LAB WORK  Thank you for choosing [facility]!!

## 2019-01-15 ENCOUNTER — Other Ambulatory Visit: Payer: Self-pay

## 2019-01-15 ENCOUNTER — Other Ambulatory Visit (HOSPITAL_COMMUNITY)
Admission: RE | Admit: 2019-01-15 | Discharge: 2019-01-15 | Disposition: A | Discharge status: Home / Self Care | Payer: BC Managed Care – PPO | Source: Ambulatory Visit | Attending: Cardiology | Admitting: Cardiology

## 2019-01-15 ENCOUNTER — Ambulatory Visit (HOSPITAL_COMMUNITY)
Admission: RE | Admit: 2019-01-15 | Discharge: 2019-01-15 | Disposition: A | Discharge status: Home / Self Care | Payer: BC Managed Care – PPO | Source: Ambulatory Visit | Attending: Cardiology | Admitting: Cardiology

## 2019-01-15 DIAGNOSIS — R0602 Shortness of breath: Secondary | ICD-10-CM | POA: Diagnosis not present

## 2019-01-15 LAB — LIPID PANEL
Cholesterol: 215 mg/dL — ABNORMAL HIGH (ref 0–200)
HDL: 48 mg/dL (ref >40)
LDL Cholesterol: 135 mg/dL — ABNORMAL HIGH (ref 0–99)
Total CHOL/HDL Ratio: 4.5 RATIO
Triglycerides: 159 mg/dL — ABNORMAL HIGH (ref <150)
VLDL: 32 mg/dL (ref 0–40)

## 2019-01-15 LAB — TSH: TSH: 1.844 u[IU]/mL (ref 0.350–4.500)

## 2019-01-15 LAB — BASIC METABOLIC PANEL
Anion gap: 8 (ref 5–15)
BUN: 17 mg/dL (ref 6–20)
CO2: 24 mmol/L (ref 22–32)
Calcium: 9 mg/dL (ref 8.9–10.3)
Chloride: 107 mmol/L (ref 98–111)
Creatinine, Ser: 0.93 mg/dL (ref 0.44–1.00)
GFR calc Af Amer: >60 mL/min (ref >60)
GFR calc non Af Amer: >60 mL/min (ref >60)
Glucose, Bld: 89 mg/dL (ref 70–99)
Potassium: 3.9 mmol/L (ref 3.5–5.1)
Sodium: 139 mmol/L (ref 135–145)

## 2019-01-15 LAB — CBC
HCT: 38 % (ref 36.0–46.0)
Hemoglobin: 11.9 g/dL — ABNORMAL LOW (ref 12.0–15.0)
MCH: 27.9 pg (ref 26.0–34.0)
MCHC: 31.3 g/dL (ref 30.0–36.0)
MCV: 89.2 fL (ref 80.0–100.0)
Platelets: 265 10*3/uL (ref 150–400)
RBC: 4.26 MIL/uL (ref 3.87–5.11)
RDW: 14.2 % (ref 11.5–15.5)
WBC: 4 10*3/uL (ref 4.0–10.5)
nRBC: 0 % (ref 0.0–0.2)

## 2019-01-15 LAB — MAGNESIUM: Magnesium: 1.9 mg/dL (ref 1.7–2.4)

## 2019-01-15 LAB — HEMOGLOBIN A1C
Hgb A1c MFr Bld: 5.2 % (ref 4.8–5.6)
Mean Plasma Glucose: 102.54 mg/dL

## 2019-01-15 NOTE — Progress Notes (Signed)
*  PRELIMINARY RESULTS* Echocardiogram 2D Echocardiogram has been performed.  Samuel Germany 01/15/2019, 1:51 PM

## 2019-01-16 ENCOUNTER — Telehealth: Payer: Self-pay | Admitting: *Deleted

## 2019-01-16 DIAGNOSIS — R0602 Shortness of breath: Secondary | ICD-10-CM

## 2019-01-16 NOTE — Telephone Encounter (Signed)
-----   Message from Arnoldo Lenis, MD sent at 01/16/2019 11:02 AM EDT ----- Echo looks good, normal heart function. Can we get a lexiscan for SOB, does not need to hold any meds   Zandra Abts MD

## 2019-01-16 NOTE — Telephone Encounter (Signed)
LM to return call.

## 2019-01-19 NOTE — Telephone Encounter (Signed)
Pt agreeable to lexiscan - will place orders and forward to schedulers - pt voiced understanding of stress test instructions

## 2019-01-20 ENCOUNTER — Telehealth: Payer: Self-pay | Admitting: Cardiology

## 2019-01-20 NOTE — Telephone Encounter (Signed)
°  Precert needed for: Lexiscan  Location: [facility]    Date: February 10, 2019

## 2019-01-21 ENCOUNTER — Telehealth: Payer: Self-pay

## 2019-01-21 ENCOUNTER — Encounter: Payer: Self-pay | Admitting: *Deleted

## 2019-01-21 NOTE — Telephone Encounter (Signed)
Called pt. No answer, left message for pt to return call.  

## 2019-01-21 NOTE — Telephone Encounter (Signed)
-----   Message from Jonathan F Branch, MD sent at 01/21/2019 11:24 AM EDT ----- Labs look good other than cholesterol is above goal. We will see how is settles out over time now that she is back on her cholesterol pill full time   J Branch MD 

## 2019-01-27 ENCOUNTER — Telehealth: Payer: Self-pay

## 2019-01-27 NOTE — Telephone Encounter (Signed)
-----   Message from Arnoldo Lenis, MD sent at 01/21/2019 11:24 AM EDT ----- Labs look good other than cholesterol is above goal. We will see how is settles out over time now that she is back on her cholesterol pill full time   Zandra Abts MD

## 2019-01-27 NOTE — Telephone Encounter (Signed)
Called pt. No answer, left message for pt to return call. Copy to pcp.

## 2019-01-29 ENCOUNTER — Ambulatory Visit: Payer: BLUE CROSS/BLUE SHIELD | Admitting: Cardiology

## 2019-02-10 ENCOUNTER — Encounter (HOSPITAL_COMMUNITY): Payer: BC Managed Care – PPO

## 2019-02-24 ENCOUNTER — Encounter (HOSPITAL_BASED_OUTPATIENT_CLINIC_OR_DEPARTMENT_OTHER)
Admission: RE | Admit: 2019-02-24 | Discharge: 2019-02-24 | Disposition: A | Discharge status: Home / Self Care | Payer: BC Managed Care – PPO | Source: Ambulatory Visit | Attending: Cardiology | Admitting: Cardiology

## 2019-02-24 ENCOUNTER — Other Ambulatory Visit: Payer: Self-pay

## 2019-02-24 ENCOUNTER — Encounter (HOSPITAL_COMMUNITY)
Admission: RE | Admit: 2019-02-24 | Discharge: 2019-02-24 | Disposition: A | Discharge status: Home / Self Care | Payer: BC Managed Care – PPO | Source: Ambulatory Visit | Attending: Cardiology | Admitting: Cardiology

## 2019-02-24 DIAGNOSIS — R0602 Shortness of breath: Secondary | ICD-10-CM | POA: Diagnosis not present

## 2019-02-24 LAB — NM MYOCAR MULTI W/SPECT W/WALL MOTION / EF
LV dias vol: 93 mL (ref 46–106)
LV sys vol: 30 mL
Peak HR: 88 {beats}/min
RATE: 0.21
Rest HR: 61 {beats}/min
SDS: 4
SRS: 0
SSS: 4
TID: 1.44

## 2019-02-24 MED ORDER — REGADENOSON 0.4 MG/5ML IV SOLN
INTRAVENOUS | Status: AC
  Administered 2019-02-24: 0.4 mg via INTRAVENOUS
  Filled 2019-02-24: qty 5

## 2019-02-24 MED ORDER — TECHNETIUM TC 99M TETROFOSMIN IV KIT
30.0000 | PACK | Freq: Once | INTRAVENOUS | Status: AC | PRN
  Administered 2019-02-24: 32 via INTRAVENOUS

## 2019-02-24 MED ORDER — TECHNETIUM TC 99M TETROFOSMIN IV KIT
10.0000 | PACK | Freq: Once | INTRAVENOUS | Status: AC | PRN
  Administered 2019-02-24: 10 via INTRAVENOUS

## 2019-02-24 MED ORDER — SODIUM CHLORIDE 0.9% FLUSH
INTRAVENOUS | Status: AC
  Administered 2019-02-24: 10 mL via INTRAVENOUS
  Filled 2019-02-24: qty 10

## 2020-01-02 ENCOUNTER — Encounter (HOSPITAL_COMMUNITY): Payer: Self-pay

## 2020-01-02 ENCOUNTER — Emergency Department (HOSPITAL_COMMUNITY): Payer: 59

## 2020-01-02 ENCOUNTER — Emergency Department (HOSPITAL_COMMUNITY)
Admission: EM | Admit: 2020-01-02 | Discharge: 2020-01-02 | Disposition: A | Discharge status: Home / Self Care | Payer: 59 | Attending: Emergency Medicine | Admitting: Emergency Medicine

## 2020-01-02 ENCOUNTER — Other Ambulatory Visit: Payer: Self-pay

## 2020-01-02 DIAGNOSIS — R0789 Other chest pain: Secondary | ICD-10-CM | POA: Diagnosis not present

## 2020-01-02 DIAGNOSIS — I252 Old myocardial infarction: Secondary | ICD-10-CM | POA: Diagnosis not present

## 2020-01-02 DIAGNOSIS — Z955 Presence of coronary angioplasty implant and graft: Secondary | ICD-10-CM | POA: Diagnosis not present

## 2020-01-02 DIAGNOSIS — N3000 Acute cystitis without hematuria: Secondary | ICD-10-CM | POA: Diagnosis not present

## 2020-01-02 DIAGNOSIS — I2510 Atherosclerotic heart disease of native coronary artery without angina pectoris: Secondary | ICD-10-CM | POA: Insufficient documentation

## 2020-01-02 DIAGNOSIS — Z87891 Personal history of nicotine dependence: Secondary | ICD-10-CM | POA: Insufficient documentation

## 2020-01-02 DIAGNOSIS — L551 Sunburn of second degree: Secondary | ICD-10-CM

## 2020-01-02 DIAGNOSIS — R079 Chest pain, unspecified: Secondary | ICD-10-CM | POA: Diagnosis present

## 2020-01-02 DIAGNOSIS — Z951 Presence of aortocoronary bypass graft: Secondary | ICD-10-CM | POA: Diagnosis not present

## 2020-01-02 DIAGNOSIS — Z79899 Other long term (current) drug therapy: Secondary | ICD-10-CM | POA: Insufficient documentation

## 2020-01-02 DIAGNOSIS — I119 Hypertensive heart disease without heart failure: Secondary | ICD-10-CM | POA: Diagnosis not present

## 2020-01-02 LAB — URINALYSIS, ROUTINE W REFLEX MICROSCOPIC
Bilirubin Urine: NEGATIVE
Glucose, UA: NEGATIVE mg/dL
Ketones, ur: NEGATIVE mg/dL
Nitrite: NEGATIVE
Protein, ur: NEGATIVE mg/dL
Specific Gravity, Urine: 1.009 (ref 1.005–1.030)
WBC, UA: >50 WBC/hpf — ABNORMAL HIGH (ref 0–5)
pH: 7 (ref 5.0–8.0)

## 2020-01-02 LAB — CBG MONITORING, ED
Glucose-Capillary: 63 mg/dL — ABNORMAL LOW (ref 70–99)
Glucose-Capillary: 140 mg/dL — ABNORMAL HIGH (ref 70–99)

## 2020-01-02 LAB — TROPONIN I (HIGH SENSITIVITY)
Troponin I (High Sensitivity): 3 ng/L (ref <18)
Troponin I (High Sensitivity): 2 ng/L (ref <18)

## 2020-01-02 LAB — BASIC METABOLIC PANEL
Anion gap: 6 (ref 5–15)
BUN: 15 mg/dL (ref 6–20)
CO2: 26 mmol/L (ref 22–32)
Calcium: 8.7 mg/dL — ABNORMAL LOW (ref 8.9–10.3)
Chloride: 108 mmol/L (ref 98–111)
Creatinine, Ser: 0.91 mg/dL (ref 0.44–1.00)
GFR calc Af Amer: >60 mL/min (ref >60)
GFR calc non Af Amer: >60 mL/min (ref >60)
Glucose, Bld: 96 mg/dL (ref 70–99)
Potassium: 3.5 mmol/L (ref 3.5–5.1)
Sodium: 140 mmol/L (ref 135–145)

## 2020-01-02 LAB — CBC
HCT: 34.9 % — ABNORMAL LOW (ref 36.0–46.0)
Hemoglobin: 10.7 g/dL — ABNORMAL LOW (ref 12.0–15.0)
MCH: 28.2 pg (ref 26.0–34.0)
MCHC: 30.7 g/dL (ref 30.0–36.0)
MCV: 92.1 fL (ref 80.0–100.0)
Platelets: 286 10*3/uL (ref 150–400)
RBC: 3.79 MIL/uL — ABNORMAL LOW (ref 3.87–5.11)
RDW: 14.5 % (ref 11.5–15.5)
WBC: 5.3 10*3/uL (ref 4.0–10.5)
nRBC: 0 % (ref 0.0–0.2)

## 2020-01-02 LAB — POC URINE PREG, ED: Preg Test, Ur: NEGATIVE

## 2020-01-02 LAB — D-DIMER, QUANTITATIVE: D-Dimer, Quant: 1.79 ug/mL-FEU — ABNORMAL HIGH (ref 0.00–0.50)

## 2020-01-02 MED ORDER — SILVER SULFADIAZINE 1 % EX CREA
TOPICAL_CREAM | Freq: Once | CUTANEOUS | Status: AC
  Filled 2020-01-02: qty 50

## 2020-01-02 MED ORDER — SODIUM CHLORIDE 0.9 % IV BOLUS
1000.0000 mL | Freq: Once | INTRAVENOUS | Status: AC
  Administered 2020-01-02: 1000 mL via INTRAVENOUS

## 2020-01-02 MED ORDER — CEPHALEXIN 500 MG PO CAPS
500.0000 mg | ORAL_CAPSULE | Freq: Once | ORAL | Status: AC
  Administered 2020-01-02: 500 mg via ORAL
  Filled 2020-01-02: qty 1

## 2020-01-02 MED ORDER — IOHEXOL 350 MG/ML SOLN
100.0000 mL | Freq: Once | INTRAVENOUS | Status: AC | PRN
  Administered 2020-01-02: 100 mL via INTRAVENOUS

## 2020-01-02 MED ORDER — FENTANYL CITRATE (PF) 100 MCG/2ML IJ SOLN
50.0000 ug | Freq: Once | INTRAMUSCULAR | Status: AC
  Administered 2020-01-02: 50 ug via INTRAVENOUS
  Filled 2020-01-02: qty 2

## 2020-01-02 MED ORDER — KETOROLAC TROMETHAMINE 30 MG/ML IJ SOLN
15.0000 mg | Freq: Once | INTRAMUSCULAR | Status: AC
  Administered 2020-01-02: 15 mg via INTRAVENOUS
  Filled 2020-01-02: qty 1

## 2020-01-02 MED ORDER — ONDANSETRON HCL 4 MG/2ML IJ SOLN
4.0000 mg | Freq: Once | INTRAMUSCULAR | Status: AC
  Administered 2020-01-02: 4 mg via INTRAVENOUS
  Filled 2020-01-02: qty 2

## 2020-01-02 MED ORDER — ASPIRIN 81 MG PO CHEW
324.0000 mg | CHEWABLE_TABLET | Freq: Once | ORAL | Status: AC
  Administered 2020-01-02: 324 mg via ORAL
  Filled 2020-01-02: qty 4

## 2020-01-02 MED ORDER — CEPHALEXIN 500 MG PO CAPS: 500.0000 mg | ORAL_CAPSULE | Freq: Four times a day (QID) | ORAL | 0 refills | Status: AC

## 2020-01-02 NOTE — ED Notes (Signed)
[place], graham crackers and peanut butter given to pt.

## 2020-01-02 NOTE — ED Triage Notes (Signed)
Pt reports severe sunburn to chest since floating the river Monday.  Reports was at work and had sudden sharp pain in chest on left side.  PT says she fell to her knees.  Pt also says has tender white bumps on her tongue, sore throat, left earache and burning with urination.

## 2020-01-02 NOTE — ED Notes (Addendum)
Swelling to BLE greater on right than left.  Pt has sunburn to mid chest with several crusted areas noted.  C/o burning to tongue since yesterday at lunch.  Pt noticed white spots on tongue and Treat with antihistamine and benadryl. C/o tip of tongue  with numbness and left ear and left neck aching, rating pain 7/10.  Pt says she has UTI, took OTC meds with no relief.  C/o cloudy urine and burning with urination.

## 2020-01-02 NOTE — ED Provider Notes (Signed)
[facility] EMERGENCY DEPARTMENT Provider Note   CSN: [id number] Arrival date & time: 01/02/20  1429     History Chief Complaint  Patient presents with  . Chest Pain  . Sunburn    Carolyn Gaines is a 54 y.o. female with a history as outlined below, significant for acute MI with subsequent CABG in 2018, GERD, anemia, arthritis and fibromyalgia presented with multiple complaints.  First, she has a severe sunburn to her chest after spending the day in the sun 6 days ago.  She has severe pain across her chest including her surgical scar from her CABG procedure.  She has been using bacitracin without significant improvement in symptoms.  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  She has had several sharp stabs of pain since the symptoms began.  It is not accompanied by shortness of breath and is not pleuritic in character.  She has had no coughing, no fevers or chills, also no palpitations nausea vomiting or diaphoresis.  She also has complaints of sore throat, left earache, white tender bumps on her tongue and also reports has been having escalating urinary frequency with painful urination which started today.  She denies her chest pain symptoms being similar to prior MI.  Other than bacitracin for the sunburn she has had no other treatments prior to arrival.  The history is provided by the patient.    HPI: A 54 year old patient with a history of hypertension, hypercholesterolemia and obesity presents for evaluation of chest pain. Initial onset of pain was approximately 1-3 hours ago. [name] chest pain is sharp and is not worse with exertion. [name] chest pain is middle- or left-sided, is not well-localized, is not described as heaviness/pressure/tightness and does not radiate to the arms/jaw/neck. The patient does not complain of nausea and  denies diaphoresis. The patient has no history of stroke, has no history of peripheral artery disease, has not smoked in the past 90 days, denies any history of treated diabetes and has no relevant family history of coronary artery disease (first degree relative at less than age 74).   Past Medical History:  Diagnosis Date  . Anemia    low iron  . Anxiety   . Arthritis   . Coronary artery disease   . Depression   . Fibromyalgia   . Gallstones   . GERD (gastroesophageal reflux disease)   . Headache    migraines  . MI (myocardial infarction) (HCC) 01/2016  . Peptic ulcer   . Seizures (HCC)    as a baby    Patient Active Problem List   Diagnosis Date Noted  . Calculus of gallbladder without cholecystitis without obstruction   . 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 Unstable angina (HCC)   . Fibromyalgia 10/20/2016  . Essential hypertension 07/18/2016  . Psoriasis 07/18/2016  . Precordial chest pain 07/03/2016  . Depression with anxiety 07/03/2016  . Normocytic anemia 07/03/2016  . History of acute inferior wall MI 02/19/2016  . Coronary artery disease involving native heart without angina pectoris 02/19/2016    Past Surgical History:  Procedure Laterality Date  . ABDOMINAL HYSTERECTOMY    . CARDIAC CATHETERIZATION N/A 02/19/2016   Procedure: Left Heart Cath and Coronary Angiography;  Surgeon: Yates Decamp, MD;  Location: [facility] INVASIVE CV LAB;  Service: Cardiovascular;  Laterality: N/A;  . CARDIAC  CATHETERIZATION N/A 02/19/2016   Procedure: Coronary Stent Intervention;  Surgeon: Adrian Prows, MD;  Location: [facility] CV LAB;  Service: Cardiovascular;  Laterality: N/A;  . CESAREAN SECTION     x 2  . CHOLECYSTECTOMY N/A 01/25/2017   Procedure: LAPAROSCOPIC CHOLECYSTECTOMY;  Surgeon: Aviva Signs, MD;  Location: [facility];  Service: General;  Laterality: N/A;  . COLONOSCOPY    . CORONARY ARTERY BYPASS GRAFT N/A 10/26/2016   Procedure: CORONARY ARTERY BYPASS GRAFTING (CABG), ON PUMP, TIMES THREE,  USING LEFT INTERNAL MAMMARY ARTERY AND ENDOSCOPICALLY HARVESTED RIGHT GREATER SAPHENOUS VEIN;  Surgeon: Gaye Pollack, MD;  Location: [facility];  Service: Open Heart Surgery;  Laterality: N/A;  LIMA-LAD SVG-RCA SVG-OM  . ESOPHAGOGASTRODUODENOSCOPY    . LEFT HEART CATH AND CORONARY ANGIOGRAPHY N/A 10/22/2016   Procedure: Left Heart Cath and Coronary Angiography;  Surgeon: Troy Sine, MD;  Location: [place] CV LAB;  Service: Cardiovascular;  Laterality: N/A;  . STERNAL WIRES REMOVAL N/A 07/11/2017   Procedure: STERNAL WIRES REMOVAL;  Surgeon: Gaye Pollack, MD;  Location: [facility];  Service: Thoracic;  Laterality: N/A;  . TEE WITHOUT CARDIOVERSION N/A 10/26/2016   Procedure: TRANSESOPHAGEAL ECHOCARDIOGRAM (TEE);  Surgeon: Gaye Pollack, MD;  Location: [facility];  Service: Open Heart Surgery;  Laterality: N/A;  . WRIST FRACTURE SURGERY Left    3 times     OB History    Gravida  2   Para  2   Term      Preterm      AB      Living  2     SAB      TAB      Ectopic      Multiple      Live Births              Family History  Problem Relation Age of Onset  . COPD Mother   . Arthritis Mother   . Heart disease Mother   . Stroke Mother   . Psoriasis Father   . Heart disease Father   . Diabetes Maternal Grandmother   . Diabetes Maternal Aunt   . Diabetes Maternal Aunt     Social History   Tobacco Use  . Smoking status: Former Smoker    Quit date: 03/21/2011    Years since quitting: 8.7  . Smokeless tobacco: Never Used  Substance Use Topics  . Alcohol use: Yes    Comment: rare  . Drug use: No    Home Medications Prior to Admission medications   Medication Sig Start Date End Date Taking? Authorizing Provider  acetaminophen (TYLENOL) 500 MG tablet Take 1,000 mg by mouth every 6 (six) hours as needed for mild pain.   Yes [provider]  nitroGLYCERIN (NITROSTAT) 0.4 MG SL tablet PLACE 1 TABLET UNDER THE TONGUE EVERY 5 MINUTES FOR 3 DOSES AS NEEDED FOR  CHEST PAIN 08/11/18  Yes Branch, Alphonse Guild, MD  cephALEXin (KEFLEX) 500 MG capsule Take 1 capsule (500 mg total) by mouth 4 (four) times daily for 5 days. 01/02/20 01/07/20  Evalee Jefferson, PA-C    Allergies    Bee venom, Elastic bandages & [zinc], Mango flavor, Adhesive [tape], and Triple antibiotic [bacitracin-neomycin-polymyxin]  Review of Systems   Review of Systems  Constitutional: Negative for chills and fever.  HENT: Positive for ear pain and sore throat. Negative for congestion.   Eyes: Negative.   Respiratory: Negative for cough, choking, chest tightness, shortness of breath and wheezing.  Cardiovascular: Positive for chest pain.  Gastrointestinal: Negative for abdominal pain, diarrhea, nausea and vomiting.  Genitourinary: Positive for dysuria.  Musculoskeletal: Positive for back pain. Negative for arthralgias, joint swelling and neck pain.  Skin: Negative.  Negative for rash and wound.  Neurological: Negative for dizziness, weakness, light-headedness, numbness and headaches.  Psychiatric/Behavioral: Negative.   All other systems reviewed and are negative.   Physical Exam Updated Vital Signs BP 114/64   Pulse 73   Temp 98.7 F (37.1 C) (Oral)   Resp 16   Ht 5\' 3"  (1.6 m)   Wt 77.1 kg   SpO2 95%   BMI 30.11 kg/m   Physical Exam Vitals and nursing note reviewed.  Constitutional:      Appearance: She is well-developed.  HENT:     Head: Normocephalic and atraumatic.     Right Ear: Tympanic membrane and ear canal normal.     Left Ear: Tympanic membrane and ear canal normal.     Nose: Nose normal. No congestion.     Mouth/Throat:     Pharynx: Oropharynx is clear. Uvula midline.     Tonsils: No tonsillar exudate.     Comments: Pharynx is clear, no cervical adenopathy.  No tongue lesions appreciated. Eyes:     Conjunctiva/sclera: Conjunctivae normal.  Cardiovascular:     Rate and Rhythm: Normal rate and regular rhythm.     Pulses: Normal pulses.     Heart sounds:  Normal heart sounds. No murmur. No friction rub.     Comments: Right lower extremity edema which is chronic per patient since her vein harvesting. Pulmonary:     Effort: Pulmonary effort is normal.     Breath sounds: Normal breath sounds. No wheezing.  Abdominal:     General: Bowel sounds are normal.     Palpations: Abdomen is soft.     Tenderness: There is no abdominal tenderness.  Musculoskeletal:        General: Normal range of motion.     Cervical back: Normal range of motion.     Right lower leg: Edema present.  Skin:    General: Skin is warm and dry.  Neurological:     General: No focal deficit present.     Mental Status: She is alert.     ED Results / Procedures / Treatments   Labs (all labs ordered are listed, but only abnormal results are displayed) Labs Reviewed  BASIC METABOLIC PANEL - Abnormal; Notable for the following components:      Result Value   Calcium 8.7 (*)    All other components within normal limits  CBC - Abnormal; Notable for the following components:   RBC 3.79 (*)    Hemoglobin 10.7 (*)    HCT 34.9 (*)    All other components within normal limits  D-DIMER, QUANTITATIVE (NOT AT [facility]) - Abnormal; Notable for the following components:   D-Dimer, Quant 1.79 (*)    All other components within normal limits  URINALYSIS, ROUTINE W REFLEX MICROSCOPIC - Abnormal; Notable for the following components:   APPearance HAZY (*)    Hgb urine dipstick SMALL (*)    Leukocytes,Ua LARGE (*)    WBC, UA >50 (*)    Bacteria, UA RARE (*)    All other components within normal limits  CBG MONITORING, ED - Abnormal; Notable for the following components:   Glucose-Capillary 63 (*)    All other components within normal limits  CBG MONITORING, ED - Abnormal; Notable for the following  components:   Glucose-Capillary 140 (*)    All other components within normal limits  URINE CULTURE  POC URINE PREG, ED  TROPONIN I (HIGH SENSITIVITY)  TROPONIN I (HIGH SENSITIVITY)     EKG EKG Interpretation  Date/Time:  Saturday January 02 2020 14:46:24 EDT Ventricular Rate:  85 PR Interval:    QRS Duration: 78 QT Interval:  346 QTC Calculation: 412 R Axis:   59 Text Interpretation: Sinus rhythm No STEMI Confirmed by Alona Bene [phone]) on 01/02/2020 3:42:42 PM   Radiology CT Angio Chest PE W and/or Wo Contrast  Result Date: 01/02/2020 CLINICAL DATA:  Left-sided chest pain. EXAM: CT ANGIOGRAPHY CHEST WITH CONTRAST TECHNIQUE: Multidetector CT imaging of the chest was performed using the standard protocol during bolus administration of intravenous contrast. Multiplanar CT image reconstructions and MIPs were obtained to evaluate the vascular anatomy. CONTRAST:  OMNIPAQUE IOHEXOL 350 MG/ML SOLN COMPARISON:  March 20, 2016 FINDINGS: Cardiovascular: Satisfactory opacification of the pulmonary arteries to the segmental level. No evidence of pulmonary embolism. Normal heart size. No pericardial effusion. Mediastinum/Nodes: No enlarged mediastinal, hilar, or axillary lymph nodes. Thyroid gland, trachea, and esophagus demonstrate no significant findings. Lungs/Pleura: An 8 mm noncalcified lung nodule is seen within the lateral aspect of the right lower lobe (axial CT image 89, CT series number 6). This represents a new finding when compared to the prior study. Very mild atelectasis is seen within the bilateral lung bases. There is no evidence of acute infiltrate, pleural effusion or pneumothorax. Upper Abdomen: Multiple surgical clips are seen within the gallbladder fossa. Musculoskeletal: No chest wall abnormality. No acute or significant osseous findings. Review of the MIP images confirms the above findings. IMPRESSION: 1. No CT evidence of pulmonary embolism. 2. No acute or active cardiopulmonary disease. 3. 8 mm noncalcified lung nodule within the lateral aspect of the right lower lobe. This represents a new finding when compared to the prior study. Correlation with three-month  follow-up chest CT is recommended to determine stability. Additional evaluation with nuclear medicine PET/CT should be considered. 4. Evidence of prior cholecystectomy. Electronically Signed   By: Aram Candela M.D.   On: 01/02/2020 17:43   DG Chest Portable 1 View  Result Date: 01/02/2020 CLINICAL DATA:  Chest pain, sunburn EXAM: PORTABLE CHEST 1 VIEW COMPARISON:  Portable exam 1546 hours compared to 11/29/2017 FINDINGS: Normal heart size post at CABG. Mediastinal contours and pulmonary vascularity normal. Bronchitic changes with normal scarring at lingula. No acute infiltrate, pleural effusion, or pneumothorax. Osseous structures unremarkable. IMPRESSION: Bronchitic changes with minimal lingular scarring. No acute abnormalities. Electronically Signed   By: Ulyses Southward M.D.   On: 01/02/2020 16:05    Procedures Procedures (including critical care time)  Medications Ordered in ED Medications  aspirin chewable tablet 324 mg (324 mg Oral Given 01/02/20 1625)  iohexol (OMNIPAQUE) 350 MG/ML injection 100 mL (100 mLs Intravenous Contrast Given 01/02/20 1715)  ondansetron (ZOFRAN) injection 4 mg (4 mg Intravenous Given 01/02/20 1749)  fentaNYL (SUBLIMAZE) injection 50 mcg (50 mcg Intravenous Given 01/02/20 1749)  silver sulfADIAZINE (SILVADENE) 1 % cream ( Topical Given 01/02/20 1908)  sodium chloride 0.9 % bolus 1,000 mL (0 mLs Intravenous Stopped 01/02/20 2211)  ketorolac (TORADOL) 30 MG/ML injection 15 mg (15 mg Intravenous Given 01/02/20 2009)  cephALEXin (KEFLEX) capsule 500 mg (500 mg Oral Given 01/02/20 2233)    ED Course  I have reviewed the triage vital signs and the nursing notes.  Pertinent labs & imaging results that were available  during my care of the patient were reviewed by me and considered in my medical decision making (see chart for details).    MDM Rules/Calculators/A&P HEAR Score: 3                    Patient's labs and imaging were reviewed and discussed with patient.  Her delta  troponins are stable and she has no changes on her EKG, doubt today's chest pain symptoms are cardiac in origin.  She did have a elevated D-dimer so CT angio imaging was completed and there is no PE on this study.  There is a new 8 mm lung nodule right base - recommend repeat CT imaging 3 months.  Her urine does have a large number of leukocytes small amount of hemoglobin, given her dysuria she was started on Keflex with first dose given here.  She also has a normocytic anemia which appears to be chronic.  She does have a significant localized sunburn limited to her chest region.  She was given Silvadene with application given here offering significant pain relief.  Patient was a patient of Dr. Juanetta Gosling but has been unable to obtain a PCP since his retirement.  She was given several local referrals for MDs who may be accepting new patients.  She was advised to follow-up with Dr. Wyline Mood who is her cardiologist, as needed for any persistent symptoms.  Lung nodule not discussed with pt during this visit. Call placed after her dc home to advise recommendation for repeat CT imaging in 3 months.   Final Clinical Impression(s) / ED Diagnoses Final diagnoses:  Atypical chest pain  Sunburn, second degree  Acute cystitis without hematuria    Rx / DC Orders ED Discharge Orders         Ordered    cephALEXin (KEFLEX) 500 MG capsule  4 times daily     01/02/20 2218           Burgess Amor, PA-C 01/02/20 2253    Bethann Berkshire, MD 01/03/20 [phone]

## 2020-01-02 NOTE — Discharge Instructions (Addendum)
Take the next dose of your antibiotic tomorrow morning.  Make sure you are drinking plenty of fluids.  Get rechecked if you have any persistent urinary symptoms or if you develop fevers, nausea or vomiting.  Apply the burn cream to the burns on your chest twice daily after soap and water wash of the site.  Follow-up with Dr. Wyline Mood as needed.  See the suggestions above for obtaining a new primary MD.

## 2020-01-04 ENCOUNTER — Telehealth: Payer: Self-pay | Admitting: Cardiology

## 2020-01-04 NOTE — Telephone Encounter (Signed)
  Patient Consent for Virtual Visit         Carolyn Gaines has provided verbal consent on 01/04/2020 for a virtual visit (video or telephone).   CONSENT FOR VIRTUAL VISIT FOR:  Carolyn Gaines  By participating in this virtual visit I agree to the following:  I hereby voluntarily request, consent and authorize [facility] and its employed or contracted physicians, physician assistants, nurse practitioners or other licensed health care professionals (the Practitioner), to provide me with telemedicine health care services (the "Services") as deemed necessary by the treating Practitioner. I acknowledge and consent to receive the Services by the Practitioner via telemedicine. I understand that the telemedicine visit will involve communicating with the Practitioner through live audiovisual communication technology and the disclosure of certain medical information by electronic transmission. I acknowledge that I have been given the opportunity to request an in-person assessment or other available alternative prior to the telemedicine visit and am voluntarily participating in the telemedicine visit.  I understand that I have the right to withhold or withdraw my consent to the use of telemedicine in the course of my care at any time, without affecting my right to future care or treatment, and that the Practitioner or I may terminate the telemedicine visit at any time. I understand that I have the right to inspect all information obtained and/or recorded in the course of the telemedicine visit and may receive copies of available information for a reasonable fee.  I understand that some of the potential risks of receiving the Services via telemedicine include:  Marland Kitchen Delay or interruption in medical evaluation due to technological equipment failure or disruption; . Information transmitted may not be sufficient (e.g. poor resolution of images) to allow for appropriate medical decision making by the Practitioner;  and/or  . In rare instances, security protocols could fail, causing a breach of personal health information.  Furthermore, I acknowledge that it is my responsibility to provide information about my medical history, conditions and care that is complete and accurate to the best of my ability. I acknowledge that Practitioner's advice, recommendations, and/or decision may be based on factors not within their control, such as incomplete or inaccurate data provided by me or distortions of diagnostic images or specimens that may result from electronic transmissions. I understand that the practice of medicine is not an exact science and that Practitioner makes no warranties or guarantees regarding treatment outcomes. I acknowledge that a copy of this consent can be made available to me via my patient portal [facility] MyChart), or I can request a printed copy by calling the office of [facility].    I understand that my insurance will be billed for this visit.   I have read or had this consent read to me. . I understand the contents of this consent, which adequately explains the benefits and risks of the Services being provided via telemedicine.  . I have been provided ample opportunity to ask questions regarding this consent and the Services and have had my questions answered to my satisfaction. . I give my informed consent for the services to be provided through the use of telemedicine in my medical care

## 2020-01-05 ENCOUNTER — Telehealth (INDEPENDENT_AMBULATORY_CARE_PROVIDER_SITE_OTHER): Payer: 59 | Admitting: Cardiology

## 2020-01-05 ENCOUNTER — Encounter: Payer: Self-pay | Admitting: Cardiology

## 2020-01-05 VITALS — BP 140/74 | Ht 63.0 in | Wt 175.0 lb

## 2020-01-05 DIAGNOSIS — R0789 Other chest pain: Secondary | ICD-10-CM | POA: Diagnosis not present

## 2020-01-05 DIAGNOSIS — I2510 Atherosclerotic heart disease of native coronary artery without angina pectoris: Secondary | ICD-10-CM | POA: Diagnosis not present

## 2020-01-05 LAB — URINE CULTURE: Culture: >=100000 — AB

## 2020-01-05 MED ORDER — ROSUVASTATIN CALCIUM 40 MG PO TABS: 40.0000 mg | ORAL_TABLET | Freq: Every evening | ORAL | 3 refills | Status: DC

## 2020-01-05 MED ORDER — CARVEDILOL 3.125 MG PO TABS: 3.1250 mg | ORAL_TABLET | Freq: Two times a day (BID) | ORAL | 3 refills | Status: DC

## 2020-01-05 MED ORDER — NITROGLYCERIN 0.4 MG SL SUBL: SUBLINGUAL_TABLET | SUBLINGUAL | 3 refills | Status: DC

## 2020-01-05 NOTE — Progress Notes (Signed)
Virtual Visit via Telephone Note   This visit type was conducted due to national recommendations for restrictions regarding the COVID-19 Pandemic (e.g. social distancing) in an effort to limit this patient's exposure and mitigate transmission in our community.  Due to her co-morbid illnesses, this patient is at least at moderate risk for complications without adequate follow up.  This format is felt to be most appropriate for this patient at this time.  The patient did not have access to video technology/had technical difficulties with video requiring transitioning to audio format only (telephone).  All issues noted in this document were discussed and addressed.  No physical exam could be performed with this format.  Please refer to the patient's chart for her  consent to telehealth for [facility].   The patient was identified using 2 identifiers.  Date:  01/05/2020   ID:  Carolyn Gaines, DOB Jun 20, 1966, MRN [id number]  Patient Location: Home Provider Location: Office  PCP:  Patient, No Pcp Per  Cardiologist:  Carlyle Dolly, MD  Electrophysiologist:  None   Evaluation Performed:  Follow-Up Visit  Chief Complaint:  Follow up  History of Present Illness:    Carolyn Gaines is a 54 y.o. female  seen today for follow up of the following medical problems. This is a focused visit for recent ER visit with chest pain.   1. 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 Residual LAD disease 60-70% managed medically. - From Dr Irven Shelling notes nuclear stress 03/2016 (after stent) with mild septal ischemia, intermediate risk. - 09/2016 cath with 90% ostial LM. S/p cabg 10/16/16 wth LIMA-LAD, SVG-OM, SVG-RCA - 09/2016 echo LVEF 55-60%, no WMAs -Has not been on ACE-I due to soft bp's - had some issues with neuropathic pain at CABG site, did not tolerate gabapentin.     01/2019 nuclear stress no ischemia   - seen in ER 01/02/20 with chest pain - trop neg x 2. EKG SR, no  ischemic changes. CT PE was negative.  - needle/stabbing like pain left sided, 9/10 in severity. Felt fatigued. Pain lasted a few minutes. Not positional. Had sunburn over this area. Since that time area is numb, tender to palpation.         SH: husband recently passed away, had been ill for long time. She has started hiking, doing things on her "bucket list" Works at pharmacy.  The patient does not have symptoms concerning for COVID-19 infection (fever, chills, cough, or new shortness of breath).    Past Medical History:  Diagnosis Date   Anemia    low iron   Anxiety    Arthritis    Coronary artery disease    Depression    Fibromyalgia    Gallstones    GERD (gastroesophageal reflux disease)    Headache    migraines   MI (myocardial infarction) ([place]) 01/2016   Peptic ulcer    Seizures ([place])    as a baby   Past Surgical History:  Procedure Laterality Date   ABDOMINAL HYSTERECTOMY     CARDIAC CATHETERIZATION N/A 02/19/2016   Procedure: Left Heart Cath and Coronary Angiography;  Surgeon: Adrian Prows, MD;  Location: [facility];  Service: Cardiovascular;  Laterality: N/A;   CARDIAC CATHETERIZATION N/A 02/19/2016   Procedure: Coronary Stent Intervention;  Surgeon: Adrian Prows, MD;  Location: [facility];  Service: Cardiovascular;  Laterality: N/A;   CESAREAN SECTION     x 2   CHOLECYSTECTOMY N/A 01/25/2017  Procedure: LAPAROSCOPIC CHOLECYSTECTOMY;  Surgeon: Franky Macho, MD;  Location: [facility];  Service: General;  Laterality: N/A;   COLONOSCOPY     CORONARY ARTERY BYPASS GRAFT N/A 10/26/2016   Procedure: CORONARY ARTERY BYPASS GRAFTING (CABG), ON PUMP, TIMES THREE, USING LEFT INTERNAL MAMMARY ARTERY AND ENDOSCOPICALLY HARVESTED RIGHT GREATER SAPHENOUS VEIN;  Surgeon: Alleen Borne, MD;  Location: [facility];  Service: Open Heart Surgery;  Laterality: N/A;  LIMA-LAD SVG-RCA SVG-OM   ESOPHAGOGASTRODUODENOSCOPY     LEFT HEART CATH AND CORONARY  ANGIOGRAPHY N/A 10/22/2016   Procedure: Left Heart Cath and Coronary Angiography;  Surgeon: Lennette Bihari, MD;  Location: [facility];  Service: Cardiovascular;  Laterality: N/A;   STERNAL WIRES REMOVAL N/A 07/11/2017   Procedure: STERNAL WIRES REMOVAL;  Surgeon: Alleen Borne, MD;  Location: [facility];  Service: Thoracic;  Laterality: N/A;   TEE WITHOUT CARDIOVERSION N/A 10/26/2016   Procedure: TRANSESOPHAGEAL ECHOCARDIOGRAM (TEE);  Surgeon: Alleen Borne, MD;  Location: [facility] OR;  Service: Open Heart Surgery;  Laterality: N/A;   WRIST FRACTURE SURGERY Left    3 times     No outpatient medications have been marked as taking for the 01/05/20 encounter (Appointment) with [name], MD.   Current Facility-Administered Medications for the 01/05/20 encounter (Appointment) with [name], MD  Medication   0.9 %  sodium chloride infusion     Allergies:   Bee venom, Elastic bandages & [zinc], Mango flavor, Adhesive [tape], and Triple antibiotic [bacitracin-neomycin-polymyxin]   Social History   Tobacco Use   Smoking status: Former Smoker    Quit date: 03/21/2011    Years since quitting: 8.8   Smokeless tobacco: Never Used  Substance Use Topics   Alcohol use: Yes    Comment: rare   Drug use: No     Family Hx: [name] family history includes Arthritis in her mother; COPD in her mother; Diabetes in her maternal aunt, maternal aunt, and maternal grandmother; Heart disease in her father and mother; Psoriasis in her father; Stroke in her mother.  ROS:   Please see the history of present illness.     All other systems reviewed and are negative.   Prior CV studies:   The following studies were reviewed today:  12/2018 echo IMPRESSIONS    1. The left ventricle has normal systolic function with an ejection  fraction of 60-65%. The cavity size was normal. Left ventricular diastolic  parameters were normal.  2. The right ventricle has normal systolic  function. The cavity was  normal. There is no increase in right ventricular wall thickness.  3. Mild thickening of the mitral valve leaflet.  4. The tricuspid valve is grossly normal.  5. The aortic valve is tricuspid. Moderate thickening of the aortic  valve. Sclerosis without any evidence of stenosis of the aortic valve.    01/2019 nuclear stress  There was no ST segment deviation noted during stress.  The study is normal. There are no perfusion defects consistent with prior infarct or current ischemia.  This is a low risk study.  The left ventricular ejection fraction is hyperdynamic (>65%).    Labs/Other Tests and Data Reviewed:    EKG:  No ECG reviewed.  Recent Labs: 01/15/2019: Magnesium 1.9; TSH 1.844 01/02/2020: BUN 15; Creatinine, Ser 0.91; Hemoglobin 10.7; Platelets 286; Potassium 3.5; Sodium 140   Recent Lipid Panel Lab Results  Component Value Date/Time   CHOL 215 (H) 01/15/2019 01:12 PM   TRIG 159 (H)  01/15/2019 01:12 PM   HDL 48 01/15/2019 01:12 PM   CHOLHDL 4.5 01/15/2019 01:12 PM   LDLCALC 135 (H) 01/15/2019 01:12 PM    Wt Readings from Last 3 Encounters:  01/02/20 170 lb (77.1 kg)  01/09/19 180 lb (81.6 kg)  12/08/18 187 lb (84.8 kg)     Objective:    Vital Signs:   Today's Vitals   01/05/20 0855  BP: 140/74  Weight: 175 lb (79.4 kg)  Height: 5\' 3"  (1.6 m)   Body mass index is 31 kg/m. Normal affect. Normal speech pattern and tone. Comfortable, no apparent distress. No audible signs of sob or wheezing.   ASSESSMENT & PLAN:    1. CAD/Chest pain - history of chronic chest wall pain since CABG, Did not tolerate gabapentin. Did have sternal wires removed 07/11/2017 with some improvement - recent severe sunburn to this area. Seen in ER at that time with severe sharp stabbing chest pain, now the area is numb. No evidence of ACS or PE during ER workup. Symptoms not consistent with ischemia. I think perhaps the sunburn has exacerbated her chronic  neuropathic pain in this area - stress test and echo last year benign - no repeat cardiac testing is planned at this time.       COVID-19 Education: The signs and symptoms of COVID-19 were discussed with the patient and how to seek care for testing (follow up with PCP or arrange E-visit).  The importance of social distancing was discussed today.  Time:   Today, I have spent 17 minutes with the patient with telehealth technology discussing the above problems.     Medication Adjustments/Labs and Tests Ordered: Current medicines are reviewed at length with the patient today.  Concerns regarding medicines are outlined above.   Tests Ordered: No orders of the defined types were placed in this encounter.   Medication Changes: No orders of the defined types were placed in this encounter.   Follow Up:  In Person in 4 month(s)  Signed, 07/13/2017, MD  01/05/2020 8:09 AM    [facility]

## 2020-01-05 NOTE — Patient Instructions (Signed)
Medication Instructions:  Continue all current medications.  Labwork: none  Testing/Procedures: none  Follow-Up: 4 months   Any Other Special Instructions Will Be Listed Below (If Applicable).  If you need a refill on your cardiac medications before your next appointment, please call your pharmacy.\ 

## 2020-01-06 ENCOUNTER — Telehealth: Payer: Self-pay | Admitting: *Deleted

## 2020-01-06 NOTE — Telephone Encounter (Signed)
Post ED Visit - Positive Culture Follow-up: Unsuccessful Patient Follow-up  Culture assessed and recommendations reviewed by:  []  , Pharm.D. []  Enzo Bi, Pharm.D., BCPS AQ-ID []  , Pharm.D., BCPS []  Celedonio Miyamoto, Pharm.D., BCPS []  [place], Garvin Fila.D., BCPS, AAHIVP []  , Pharm.D., BCPS, AAHIVP []  Georgina Pillion, PharmD []  , PharmD, BCPS  Positive urine culture  []  Patient discharged without antimicrobial prescription and treatment is now indicated [x]  Organism is resistant to prescribed ED discharge antimicrobial []  Patient with positive blood cultures  Plan: Stop Cephalexin, Start Macrobid 100mg  PO BID x 5 days, [place], DO  Unable to contact patient after 3 attempts, letter will be sent to address on file  [place] 01/06/2020, 9:20 AM

## 2020-01-06 NOTE — Progress Notes (Signed)
ED Antimicrobial Stewardship Positive Culture Follow Up   Carolyn Gaines is an 54 y.o. female who presented to [facility] on 01/02/2020 with a chief complaint of urinary frequency with painful urination.   Chief Complaint  Patient presents with  . Chest Pain  . Sunburn    Recent Results (from the past 720 hour(s))  Urine Culture     Status: Abnormal   Collection Time: 01/02/20  5:54 PM   Specimen: Urine, Clean Catch  Result Value Ref Range Status   Specimen Description   Final    URINE, CLEAN CATCH Performed at [facility], [address] [place], [place] [address]    Special Requests   Final    NONE Performed at [facility], [address] [place], [place] [address]    Culture >=100,000 COLONIES/mL STAPHYLOCOCCUS SAPROPHYTICUS (A)  Final   Report Status 01/05/2020 FINAL  Final   Organism ID, Bacteria STAPHYLOCOCCUS SAPROPHYTICUS (A)  Final      Susceptibility   Staphylococcus saprophyticus - MIC*    CIPROFLOXACIN <=0.5 SENSITIVE Sensitive     GENTAMICIN <=0.5 SENSITIVE Sensitive     NITROFURANTOIN <=16 SENSITIVE Sensitive     OXACILLIN >=4 RESISTANT Resistant     TETRACYCLINE <=1 SENSITIVE Sensitive     VANCOMYCIN <=0.5 SENSITIVE Sensitive     TRIMETH/SULFA <=10 SENSITIVE Sensitive     CLINDAMYCIN <=0.25 SENSITIVE Sensitive     RIFAMPIN <=0.5 SENSITIVE Sensitive     Inducible Clindamycin NEGATIVE Sensitive     * >=100,000 COLONIES/mL STAPHYLOCOCCUS SAPROPHYTICUS    [x]  Treated with Cephalexin, organism resistant to prescribed antimicrobial  New antibiotic prescription: Macrobid 100 mg PO twice daily for 5 days.  ED Provider: , DO  Virgina Norfolk, PharmD PGY1 Ambulatory Care Resident Monday - Friday phone -  [phone] Saturday - Sunday phone - [phone]

## 2020-01-15 ENCOUNTER — Telehealth: Payer: Self-pay

## 2020-01-15 NOTE — Telephone Encounter (Signed)
Post ED Visit - Positive Culture Follow-up: Successful Patient Follow-Up  Culture assessed and recommendations reviewed by:  []  , Pharm.D. []  Enzo Bi, Pharm.D., BCPS AQ-ID []  , Pharm.D., BCPS []  Celedonio Miyamoto, Pharm.D., BCPS []  [place], Garvin Fila.D., BCPS, AAHIVP []  , Pharm.D., BCPS, AAHIVP []  Georgina Pillion, PharmD, BCPS []  , PharmD, BCPS []  [place], PharmD, BCPS []  [place], PharmD Pharm D Positive urine culture  []  Patient discharged without antimicrobial prescription and treatment is now indicated [x]  Organism is resistant to prescribed ED discharge antimicrobial []  Patient with positive blood cultures  Changes discussed with ED provider: A [place] DO New antibiotic prescription Macrobid 100 mg BID x 5 days Called to [facility] [phone]  Contacted patient, date 01/15/20, time 1044   Rehanna Oloughlin, 01/15/2020, 10:42 AM

## 2020-02-16 ENCOUNTER — Telehealth: Payer: Self-pay

## 2020-02-19 ENCOUNTER — Other Ambulatory Visit (HOSPITAL_COMMUNITY): Payer: Self-pay | Admitting: Internal Medicine

## 2020-02-19 DIAGNOSIS — Z1231 Encounter for screening mammogram for malignant neoplasm of breast: Secondary | ICD-10-CM

## 2020-04-26 ENCOUNTER — Institutional Professional Consult (permissible substitution): Payer: 59 | Admitting: Neurology

## 2020-05-12 ENCOUNTER — Ambulatory Visit: Payer: 59 | Admitting: Cardiology

## 2020-11-09 ENCOUNTER — Observation Stay (HOSPITAL_COMMUNITY)
Admission: EM | Admit: 2020-11-09 | Discharge: 2020-11-10 | Disposition: A | Discharge status: Home / Self Care | Payer: 59 | Attending: Family Medicine | Admitting: Family Medicine

## 2020-11-09 ENCOUNTER — Emergency Department (HOSPITAL_COMMUNITY): Payer: 59

## 2020-11-09 ENCOUNTER — Other Ambulatory Visit: Payer: Self-pay

## 2020-11-09 ENCOUNTER — Encounter (HOSPITAL_COMMUNITY): Payer: Self-pay | Admitting: Emergency Medicine

## 2020-11-09 DIAGNOSIS — I10 Essential (primary) hypertension: Secondary | ICD-10-CM | POA: Diagnosis not present

## 2020-11-09 DIAGNOSIS — Z87891 Personal history of nicotine dependence: Secondary | ICD-10-CM | POA: Diagnosis not present

## 2020-11-09 DIAGNOSIS — I25110 Atherosclerotic heart disease of native coronary artery with unstable angina pectoris: Secondary | ICD-10-CM | POA: Diagnosis not present

## 2020-11-09 DIAGNOSIS — Z79899 Other long term (current) drug therapy: Secondary | ICD-10-CM | POA: Insufficient documentation

## 2020-11-09 DIAGNOSIS — Z951 Presence of aortocoronary bypass graft: Secondary | ICD-10-CM | POA: Insufficient documentation

## 2020-11-09 DIAGNOSIS — I2510 Atherosclerotic heart disease of native coronary artery without angina pectoris: Secondary | ICD-10-CM | POA: Diagnosis present

## 2020-11-09 DIAGNOSIS — L03012 Cellulitis of left finger: Secondary | ICD-10-CM

## 2020-11-09 DIAGNOSIS — Z20822 Contact with and (suspected) exposure to covid-19: Secondary | ICD-10-CM | POA: Diagnosis not present

## 2020-11-09 DIAGNOSIS — I252 Old myocardial infarction: Secondary | ICD-10-CM

## 2020-11-09 DIAGNOSIS — I25810 Atherosclerosis of coronary artery bypass graft(s) without angina pectoris: Secondary | ICD-10-CM | POA: Diagnosis not present

## 2020-11-09 DIAGNOSIS — R0789 Other chest pain: Secondary | ICD-10-CM

## 2020-11-09 DIAGNOSIS — I200 Unstable angina: Secondary | ICD-10-CM

## 2020-11-09 DIAGNOSIS — Z7982 Long term (current) use of aspirin: Secondary | ICD-10-CM | POA: Diagnosis not present

## 2020-11-09 DIAGNOSIS — R079 Chest pain, unspecified: Secondary | ICD-10-CM | POA: Diagnosis present

## 2020-11-09 LAB — CBC
HCT: 38 % (ref 36.0–46.0)
Hemoglobin: 12.2 g/dL (ref 12.0–15.0)
MCH: 28.7 pg (ref 26.0–34.0)
MCHC: 32.1 g/dL (ref 30.0–36.0)
MCV: 89.4 fL (ref 80.0–100.0)
Platelets: 252 10*3/uL (ref 150–400)
RBC: 4.25 MIL/uL (ref 3.87–5.11)
RDW: 13.9 % (ref 11.5–15.5)
WBC: 4.3 10*3/uL (ref 4.0–10.5)
nRBC: 0 % (ref 0.0–0.2)

## 2020-11-09 LAB — LIPID PANEL
Cholesterol: 188 mg/dL (ref 0–200)
HDL: 39 mg/dL — ABNORMAL LOW (ref >40)
LDL Cholesterol: 125 mg/dL — ABNORMAL HIGH (ref 0–99)
Total CHOL/HDL Ratio: 4.8 RATIO
Triglycerides: 118 mg/dL (ref <150)
VLDL: 24 mg/dL (ref 0–40)

## 2020-11-09 LAB — CBG MONITORING, ED: Glucose-Capillary: 78 mg/dL (ref 70–99)

## 2020-11-09 LAB — BASIC METABOLIC PANEL
Anion gap: 9 (ref 5–15)
BUN: 14 mg/dL (ref 6–20)
CO2: 24 mmol/L (ref 22–32)
Calcium: 8.8 mg/dL — ABNORMAL LOW (ref 8.9–10.3)
Chloride: 105 mmol/L (ref 98–111)
Creatinine, Ser: 0.89 mg/dL (ref 0.44–1.00)
GFR, Estimated: >60 mL/min (ref >60)
Glucose, Bld: 93 mg/dL (ref 70–99)
Potassium: 4 mmol/L (ref 3.5–5.1)
Sodium: 138 mmol/L (ref 135–145)

## 2020-11-09 LAB — RESP PANEL BY RT-PCR (FLU A&B, COVID) ARPGX2
Influenza A by PCR: NEGATIVE
Influenza B by PCR: NEGATIVE
SARS Coronavirus 2 by RT PCR: NEGATIVE

## 2020-11-09 LAB — PROTIME-INR
INR: 1.1 (ref 0.8–1.2)
Prothrombin Time: 13.7 seconds (ref 11.4–15.2)

## 2020-11-09 LAB — TROPONIN I (HIGH SENSITIVITY)
Troponin I (High Sensitivity): <2 ng/L (ref <18)
Troponin I (High Sensitivity): 2 ng/L (ref <18)
Troponin I (High Sensitivity): 2 ng/L (ref <18)

## 2020-11-09 LAB — APTT: aPTT: 28 seconds (ref 24–36)

## 2020-11-09 MED ORDER — ASPIRIN 81 MG PO CHEW
324.0000 mg | CHEWABLE_TABLET | Freq: Once | ORAL | Status: AC
  Administered 2020-11-09: 324 mg via ORAL
  Filled 2020-11-09: qty 4

## 2020-11-09 MED ORDER — ONDANSETRON HCL 4 MG/2ML IJ SOLN
4.0000 mg | Freq: Four times a day (QID) | INTRAMUSCULAR | Status: DC | PRN
  Administered 2020-11-10 (×2): 4 mg via INTRAVENOUS
  Filled 2020-11-09 (×2): qty 2

## 2020-11-09 MED ORDER — SODIUM CHLORIDE 0.9 % IV SOLN: INTRAVENOUS | Status: DC

## 2020-11-09 MED ORDER — MORPHINE SULFATE (PF) 4 MG/ML IV SOLN
2.0000 mg | Freq: Once | INTRAVENOUS | Status: AC
  Administered 2020-11-09: 2 mg via INTRAVENOUS
  Filled 2020-11-09: qty 1

## 2020-11-09 MED ORDER — HEPARIN SODIUM (PORCINE) 5000 UNIT/ML IJ SOLN
5000.0000 [IU] | Freq: Three times a day (TID) | INTRAMUSCULAR | Status: DC
  Administered 2020-11-10: 5000 [IU] via SUBCUTANEOUS
  Filled 2020-11-09: qty 1

## 2020-11-09 MED ORDER — NITROGLYCERIN 0.4 MG SL SUBL: 0.4000 mg | SUBLINGUAL_TABLET | SUBLINGUAL | Status: DC | PRN

## 2020-11-09 MED ORDER — DOXYCYCLINE HYCLATE 100 MG PO TABS
100.0000 mg | ORAL_TABLET | Freq: Two times a day (BID) | ORAL | Status: DC
  Administered 2020-11-09 – 2020-11-10 (×2): 100 mg via ORAL
  Filled 2020-11-09 (×2): qty 1

## 2020-11-09 MED ORDER — LIDOCAINE HCL (PF) 1 % IJ SOLN
5.0000 mL | Freq: Once | INTRAMUSCULAR | Status: AC
  Administered 2020-11-09: 5 mL via INTRADERMAL
  Filled 2020-11-09: qty 30

## 2020-11-09 MED ORDER — PANTOPRAZOLE SODIUM 40 MG PO TBEC
40.0000 mg | DELAYED_RELEASE_TABLET | Freq: Every day | ORAL | Status: DC
  Administered 2020-11-09 – 2020-11-10 (×2): 40 mg via ORAL
  Filled 2020-11-09 (×2): qty 1

## 2020-11-09 MED ORDER — ROSUVASTATIN CALCIUM 20 MG PO TABS
40.0000 mg | ORAL_TABLET | Freq: Every evening | ORAL | Status: DC
  Administered 2020-11-09: 40 mg via ORAL
  Filled 2020-11-09 (×2): qty 2

## 2020-11-09 MED ORDER — MORPHINE SULFATE (PF) 2 MG/ML IV SOLN
1.0000 mg | INTRAVENOUS | Status: DC | PRN
  Administered 2020-11-09: 1 mg via INTRAVENOUS
  Filled 2020-11-09: qty 1

## 2020-11-09 MED ORDER — CARVEDILOL 3.125 MG PO TABS
3.1250 mg | ORAL_TABLET | Freq: Two times a day (BID) | ORAL | Status: DC
  Administered 2020-11-09 – 2020-11-10 (×2): 3.125 mg via ORAL
  Filled 2020-11-09 (×2): qty 1

## 2020-11-09 MED ORDER — ASPIRIN EC 81 MG PO TBEC
81.0000 mg | DELAYED_RELEASE_TABLET | Freq: Every day | ORAL | Status: DC
  Administered 2020-11-10: 81 mg via ORAL
  Filled 2020-11-09: qty 1

## 2020-11-09 MED ORDER — ACETAMINOPHEN 325 MG PO TABS
650.0000 mg | ORAL_TABLET | ORAL | Status: DC | PRN
  Administered 2020-11-10 (×2): 650 mg via ORAL
  Filled 2020-11-09 (×2): qty 2

## 2020-11-09 MED ORDER — DULOXETINE HCL 30 MG PO CPEP
60.0000 mg | ORAL_CAPSULE | Freq: Every day | ORAL | Status: DC
  Administered 2020-11-09 – 2020-11-10 (×2): 60 mg via ORAL
  Filled 2020-11-09 (×2): qty 2

## 2020-11-09 NOTE — ED Provider Notes (Signed)
[facility] EMERGENCY DEPARTMENT Provider Note   CSN: [id number] Arrival date & time: 11/09/20  1701     History No chief complaint on file.   Carolyn Gaines is a 55 y.o. female.  HPI  The patient was seen by myself at 5:15 PM on arrival   This patient is a 55 year old female, she has a history of coronary disease status post multiple stents as well as bypass grafting, this was a triple bypass, she is currently on medications including a baby aspirin, carvedilol, Cymbalta, nitroglycerin as needed, Crestor and tramadol.  The patient reports that over the last week she has had some intermittent and worsening left-sided chest pain which radiates into her left arm, she has been taking nitroglycerin intermittently with some relief though it does give her a headache and makes her feel nauseated.  At this time she has mild chest pain, this has been feeling like a heavy aching feeling on her chest, there is no shortness of breath or diaphoresis associated with this, she does not have any new swelling in her legs but does have a chronic mild persistent swelling.  She has not had any coughing or shortness of breath or fevers.  She has been fully vaccinated for Covid.  She reports that the nitroglycerin helped her pain, exertion worsens the pain, she works as a Social research officer, government at [employer] and is doing a lot of lifting and moving which seems to make it worse as well.  She last saw her cardiologist by virtual visit on June 8 She last had an echocardiogram June 2020 showing an ejection fraction of 60 to 65% with normal left ventricular diastolic parameters.  And her last heart catheterization was March 2018 showing a mid LAD lesion of 50%, scattered other lesions including LM lesion of 85%, she had coronary bypass surgery after this.  She had a Lexiscan myocardial study showing no ST segment deviation during the stress test, they did consider this a normal study at the time.  Over the last week  the patient also has complained of left fourth finger pain near the edge of the nail.  On the radial surface of the nail there it appears to be a paronychia, yesterday she tried to sterilize the tip of the needle and puncture it but said nothing came out.   Past Medical History:  Diagnosis Date  . Anemia    low iron  . Anxiety   . Arthritis   . Coronary artery disease   . Depression   . Fibromyalgia   . Gallstones   . GERD (gastroesophageal reflux disease)   . Headache    migraines  . MI (myocardial infarction) (HCC) 01/2016  . Peptic ulcer   . Seizures (HCC)    as a baby    Patient Active Problem List   Diagnosis Date Noted  . Chest pain 11/09/2020  . Calculus of gallbladder without cholecystitis without obstruction   . 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 Unstable angina (HCC)   . Fibromyalgia 10/20/2016  . Essential hypertension 07/18/2016  . Psoriasis 07/18/2016  . Precordial chest pain 07/03/2016  . Depression with anxiety 07/03/2016  . Normocytic anemia 07/03/2016  . History of acute inferior wall MI 02/19/2016  . Coronary artery disease involving native heart without angina pectoris 02/19/2016    Past Surgical History:  Procedure Laterality Date  . ABDOMINAL HYSTERECTOMY    . CARDIAC CATHETERIZATION N/A 02/19/2016   Procedure: Left Heart Cath and Coronary Angiography;  Surgeon: Yates DecampJay Ganji, MD;  [facility];  Service: Cardiovascular;  Laterality: N/A;  . CARDIAC CATHETERIZATION N/A 02/19/2016   Procedure: Coronary Stent Intervention;  Surgeon: Yates DecampJay Ganji, MD;  [facility];  Service: Cardiovascular;  Laterality: N/A;  . CESAREAN SECTION     x 2  . CHOLECYSTECTOMY N/A 01/25/2017   Procedure: LAPAROSCOPIC CHOLECYSTECTOMY;  Surgeon: Franky MachoJenkins, Mark, MD;  Location: [facility] ORS;  Service: General;  Laterality: N/A;  . COLONOSCOPY    . CORONARY ARTERY BYPASS GRAFT N/A 10/26/2016   Procedure: CORONARY ARTERY BYPASS GRAFTING (CABG), ON PUMP, TIMES THREE,  USING LEFT INTERNAL MAMMARY ARTERY AND ENDOSCOPICALLY HARVESTED RIGHT GREATER SAPHENOUS VEIN;  Surgeon: Alleen BorneBryan K Bartle, MD;  Location: [facility] OR;  Service: Open Heart Surgery;  Laterality: N/A;  LIMA-LAD SVG-RCA SVG-OM  . ESOPHAGOGASTRODUODENOSCOPY    . LEFT HEART CATH AND CORONARY ANGIOGRAPHY N/A 10/22/2016   Procedure: Left Heart Cath and Coronary Angiography;  Surgeon: Lennette Biharihomas A Kelly, MD;  [facility];  Service: Cardiovascular;  Laterality: N/A;  . STERNAL WIRES REMOVAL N/A 07/11/2017   Procedure: STERNAL WIRES REMOVAL;  Surgeon: Alleen BorneBartle, Bryan K, MD;  Location: [facility] OR;  Service: Thoracic;  Laterality: N/A;  . TEE WITHOUT CARDIOVERSION N/A 10/26/2016   Procedure: TRANSESOPHAGEAL ECHOCARDIOGRAM (TEE);  Surgeon: Alleen BorneBryan K Bartle, MD;  [facility];  Service: Open Heart Surgery;  Laterality: N/A;  . WRIST FRACTURE SURGERY Left    3 times     OB History    Gravida  2   Para  2   Term      Preterm      AB      Living  2     SAB      IAB      Ectopic      Multiple      Live Births              Family History  Problem Relation Age of Onset  . COPD Mother   . Arthritis Mother   . Heart disease Mother   . Stroke Mother   . Psoriasis Father   . Heart disease Father   . Diabetes Maternal Grandmother   . Diabetes Maternal Aunt   . Diabetes Maternal Aunt     Social History   Tobacco Use  . Smoking status: Former Smoker    Quit date: 03/21/2011    Years since quitting: 9.6  . Smokeless tobacco: Never Used  Vaping Use  . Vaping Use: Never used  Substance Use Topics  . Alcohol use: Yes    Comment: rare  . Drug use: No    Home Medications Prior to Admission medications   Medication Sig Start Date End Date Taking? Authorizing Provider  acetaminophen (TYLENOL) 500 MG tablet Take 1,000 mg by mouth every 6 (six) hours as needed for mild pain.    [provider]  aspirin EC 81 MG tablet Take 81 mg by mouth daily.    [provider]   carvedilol (COREG) 3.125 MG tablet Take 1 tablet (3.125 mg total) by mouth 2 (two) times daily. 01/05/20   Antoine PocheBranch, Jonathan F, MD  DULoxetine (CYMBALTA) 60 MG capsule Take 60 mg by mouth daily.    [provider]  fluconazole (DIFLUCAN) 100 MG tablet Take by mouth. 10/10/20   [provider]  methocarbamol (ROBAXIN) 500 MG tablet Take 1-2 tablets by mouth 3 (three) times daily as needed. 10/10/20   [provider]  mirtazapine (REMERON) 15 MG tablet Take by mouth. 07/12/20   [provider]  nitroGLYCERIN (NITROSTAT) 0.4 MG SL tablet PLACE 1 TABLET UNDER THE TONGUE EVERY 5 MINUTES FOR 3 DOSES AS NEEDED FOR CHEST PAIN 01/05/20   [name], MD  pantoprazole (PROTONIX) 40 MG tablet Take 40 mg by mouth daily.    [provider]  rosuvastatin (CRESTOR) 40 MG tablet Take 1 tablet (40 mg total) by mouth every evening. 01/05/20   [name], MD  traMADol (ULTRAM) 50 MG tablet Take 50 mg by mouth as needed.    [provider]  valACYclovir (VALTREX) 1000 MG tablet Take 1,000 mg by mouth 2 (two) times daily. 07/08/20   [provider]    Allergies    Bee venom, Elastic bandages & [zinc], Mango flavor, Adhesive [tape], and Triple antibiotic [bacitracin-neomycin-polymyxin]  Review of Systems   Review of Systems  All other systems reviewed and are negative.   Physical Exam Updated Vital Signs BP 116/69   Pulse 71   Temp 98.1 F (36.7 C) (Oral)   Resp 18   SpO2 99%   Physical Exam Vitals and nursing note reviewed.  Constitutional:      General: She is not in acute distress.    Appearance: She is well-developed.  HENT:     Head: Normocephalic and atraumatic.     Mouth/Throat:     Pharynx: No oropharyngeal exudate.  Eyes:     General: No scleral icterus.       Right eye: No discharge.        Left eye: No discharge.     Conjunctiva/sclera: Conjunctivae normal.     Pupils: Pupils are equal, round, and reactive to  light.  Neck:     Thyroid: No thyromegaly.     Vascular: No JVD.  Cardiovascular:     Rate and Rhythm: Normal rate and regular rhythm.     Heart sounds: Normal heart sounds. No murmur heard. No friction rub. No gallop.   Pulmonary:     Effort: Pulmonary effort is normal. No respiratory distress.     Breath sounds: Normal breath sounds. No wheezing or rales.  Abdominal:     General: Bowel sounds are normal. There is no distension.     Palpations: Abdomen is soft. There is no mass.     Tenderness: There is no abdominal tenderness.  Musculoskeletal:        General: Swelling and tenderness present. Normal range of motion.     Cervical back: Normal range of motion and neck supple.     Comments: Tenderness and swelling to the tip of the fourth finger of the left hand  Lymphadenopathy:     Cervical: No cervical adenopathy.  Skin:    General: Skin is warm and dry.     Findings: No erythema or rash.  Neurological:     Mental Status: She is alert.     Coordination: Coordination normal.  Psychiatric:        Behavior: Behavior normal.     ED Results / Procedures / Treatments   Labs (all labs ordered are listed, but only abnormal results are displayed) Labs Reviewed  BASIC METABOLIC PANEL - Abnormal; Notable for the following components:      Result Value   Calcium 8.8 (*)    All other components within normal limits  RESP PANEL BY RT-PCR (FLU A&B, COVID) ARPGX2  CBC  APTT  PROTIME-INR  HIV ANTIBODY (ROUTINE  TESTING W REFLEX)  LIPID PANEL  CBG MONITORING, ED  TROPONIN I (HIGH SENSITIVITY)  TROPONIN I (HIGH SENSITIVITY)    EKG None  Radiology DG Chest Portable 1 View  Result Date: 11/09/2020 CLINICAL DATA:  Chest pain, shortness of breath. EXAM: PORTABLE CHEST 1 VIEW COMPARISON:  February 22, 2020. FINDINGS: The heart size and mediastinal contours are within normal limits. Both lungs are clear. No pneumothorax or pleural effusion is noted. The visualized skeletal structures are  unremarkable. IMPRESSION: No active disease. Electronically Signed   By: Lupita Raider M.D.   On: 11/09/2020 17:54    Procedures .Marland KitchenIncision and Drainage  Date/Time: 11/09/2020 6:58 PM Performed by: Eber Hong, MD Authorized by: Eber Hong, MD   Consent:    Consent obtained:  Verbal   Consent given by:  Patient   Risks discussed:  Bleeding, damage to other organs, incomplete drainage, infection and pain   Alternatives discussed:  Delayed treatment Universal protocol:    Procedure explained and questions answered to patient or proxy's satisfaction: yes     Relevant documents present and verified: yes     Test results available : yes     Required blood products, implants, devices, and special equipment available: yes     Site/side marked: yes     Immediately prior to procedure, a time out was called: yes     Patient identity confirmed:  Verbally with patient Location:    Type:  Abscess (paronychia finger)   Size:  Small   Location:  Upper extremity   Upper extremity location:  Finger   Finger location:  L ring finger Pre-procedure details:    Skin preparation:  Betadine Anesthesia:    Anesthesia method:  Nerve block   Block location:  4th digit L hand   Block needle gauge:  25 G   Block anesthetic:  Lidocaine 1% w/o epi   Block technique:  Digital 0- single palmar injection   Block injection procedure:  Anatomic landmarks identified, introduced needle, incremental injection, anatomic landmarks palpated and negative aspiration for blood   Block outcome:  Anesthesia achieved Procedure type:    Complexity:  Complex Procedure details:    Ultrasound guidance: no     Needle aspiration: no     Incision types:  Single straight   Incision depth:  Dermal   Wound management:  Probed and deloculated, irrigated with saline and extensive cleaning   Drainage:  Purulent   Drainage amount:  Scant   Wound treatment:  Wound left open   Packing materials:  None Post-procedure  details:    Procedure completion:  Tolerated well, no immediate complications Comments:           Medications Ordered in ED Medications  0.9 %  sodium chloride infusion ( Intravenous New Bag/Given 11/09/20 1742)  acetaminophen (TYLENOL) tablet 650 mg (has no administration in time range)  ondansetron (ZOFRAN) injection 4 mg (has no administration in time range)  heparin injection 5,000 Units (has no administration in time range)  aspirin chewable tablet 324 mg (324 mg Oral Given 11/09/20 1743)  lidocaine (PF) (XYLOCAINE) 1 % injection 5 mL (5 mLs Intradermal Given 11/09/20 1744)  morphine 4 MG/ML injection 2 mg (2 mg Intravenous Given 11/09/20 1835)    ED Course  I have reviewed the triage vital signs and the nursing notes.  Pertinent labs & imaging results that were available during my care of the patient were reviewed by me and considered in my medical decision making (see  chart for details).    MDM Rules/Calculators/A&P                          [name] cardiac and pulmonary exams are unremarkable, will get an EKG labs and a chest x-ray.  The patient will need to be evaluated for possible recurrent ischemia.  This may be chest wall pain but given her significant increased risk she will likely need to have trending troponins and admission for rule out.  This is nitroglycerin responsive, exertionally worse and sounds that it could be consistent with coronary disease.  Regarding the paronychia will do digital block and drainage  Patient had a successful treatment of the paronychia, I discussed this with the hospitalist especially regarding doxycycline twice a day, warm soaks in hot soapy water.  We will hold on heparin at this time until second troponin comes back, hospitalist will admit for further cardiac evaluation.  Patient is agreeable    Final Clinical Impression(s) / ED Diagnoses Final diagnoses:  Unstable angina (HCC)  Paronychia, finger, left      Eber Hong,  MD 11/09/20 1900

## 2020-11-09 NOTE — H&P (Signed)
TRH H&P   Patient Demographics:    Carolyn Gaines, is a 55 y.o. female  MRN: [id number]   DOB - 08-13-1965  Admit Date - 11/09/2020  Outpatient Primary MD for the patient is Benita Stabile, MD  Referring MD/NP/PA: Dr Hyacinth Meeker  Outpatient Specialists: Dr Wyline Mood    Patient coming from: home  No chief complaint on file.     HPI:    Carolyn Gaines  is a 55 y.o. female, with past medical history of CAD, status post stent, then status post CABG in 2018, tension, hyperlipidemia, depression and anxiety, patient presents to ED secondary to complaints of chest pain, reports an ongoing for last week, intermittent, sided lower radiating to the left arm, reported worsening by activity and lifting heavy object at work, she does report some dyspnea as well, forces of an aching quality, she does report some mild accompanying dyspnea, denies any fever, chills, nausea or diaphoresis, patient had normal Lexiscan in 2020, as well she does report fourth finger pain and left arm, and test what brought her to the hospital as well. -In ED her troponins were negative, EKG nonacute, patient had I&D of the left fourth finger paronychia done in ED, triage hospitalist consulted to admit for further work-up for chest pain.    Review of systems:    In addition to the HPI above,  No Fever-chills, No Headache, No changes with Vision or hearing, No problems swallowing food or Liquids, Complains of chest pain, no cough, but she does report some shortness of Breath, No Abdominal pain, No Nausea or Vommitting, Bowel movements are regular, No Blood in stool or Urine, No dysuria, No new skin rashes or bruises, No new joints pains-aches,  No new weakness, tingling, numbness in any extremity, No recent weight gain or loss, No polyuria, polydypsia or polyphagia, No significant Mental Stressors.  A full 10 point Review of  Systems was done, except as stated above, all other Review of Systems were negative.   With Past History of the following :    Past Medical History:  Diagnosis Date  . Anemia    low iron  . Anxiety   . Arthritis   . Coronary artery disease   . Depression   . Fibromyalgia   . Gallstones   . GERD (gastroesophageal reflux disease)   . Headache    migraines  . MI (myocardial infarction) (HCC) 01/2016  . Peptic ulcer   . Seizures (HCC)    as a baby      Past Surgical History:  Procedure Laterality Date  . ABDOMINAL HYSTERECTOMY    . CARDIAC CATHETERIZATION N/A 02/19/2016   Procedure: Left Heart Cath and Coronary Angiography;  Surgeon: Yates Decamp, MD;  Location: [facility] INVASIVE CV LAB;  Service: Cardiovascular;  Laterality: N/A;  . CARDIAC CATHETERIZATION N/A 02/19/2016   Procedure: Coronary Stent Intervention;  Surgeon: Yates Decamp, MD;  Location:  [facility] INVASIVE CV LAB;  Service: Cardiovascular;  Laterality: N/A;  . CESAREAN SECTION     x 2  . CHOLECYSTECTOMY N/A 01/25/2017   Procedure: LAPAROSCOPIC CHOLECYSTECTOMY;  Surgeon: Franky Macho, MD;  Location: [facility] ORS;  Service: General;  Laterality: N/A;  . COLONOSCOPY    . CORONARY ARTERY BYPASS GRAFT N/A 10/26/2016   Procedure: CORONARY ARTERY BYPASS GRAFTING (CABG), ON PUMP, TIMES THREE, USING LEFT INTERNAL MAMMARY ARTERY AND ENDOSCOPICALLY HARVESTED RIGHT GREATER SAPHENOUS VEIN;  Surgeon: Alleen Borne, MD;  Location: [facility] OR;  Service: Open Heart Surgery;  Laterality: N/A;  LIMA-LAD SVG-RCA SVG-OM  . ESOPHAGOGASTRODUODENOSCOPY    . LEFT HEART CATH AND CORONARY ANGIOGRAPHY N/A 10/22/2016   Procedure: Left Heart Cath and Coronary Angiography;  Surgeon: Lennette Bihari, MD;  Location: [facility] INVASIVE CV LAB;  Service: Cardiovascular;  Laterality: N/A;  . STERNAL WIRES REMOVAL N/A 07/11/2017   Procedure: STERNAL WIRES REMOVAL;  Surgeon: Alleen Borne, MD;  Location: [facility] OR;  Service: Thoracic;  Laterality: N/A;  . TEE WITHOUT CARDIOVERSION N/A  10/26/2016   Procedure: TRANSESOPHAGEAL ECHOCARDIOGRAM (TEE);  Surgeon: Alleen Borne, MD;  Location: [facility] OR;  Service: Open Heart Surgery;  Laterality: N/A;  . WRIST FRACTURE SURGERY Left    3 times      Social History:     Social History   Tobacco Use  . Smoking status: Former Smoker    Quit date: 03/21/2011    Years since quitting: 9.6  . Smokeless tobacco: Never Used  Substance Use Topics  . Alcohol use: Yes    Comment: rare        Family History :     Family History  Problem Relation Age of Onset  . COPD Mother   . Arthritis Mother   . Heart disease Mother   . Stroke Mother   . Psoriasis Father   . Heart disease Father   . Diabetes Maternal Grandmother   . Diabetes Maternal Aunt   . Diabetes Maternal Aunt       Home Medications:   Prior to Admission medications   Medication Sig Start Date End Date Taking? Authorizing Provider  acetaminophen (TYLENOL) 500 MG tablet Take 1,000 mg by mouth every 6 (six) hours as needed for mild pain.   Yes [provider]  aspirin EC 81 MG tablet Take 81 mg by mouth daily.   Yes [provider]  carvedilol (COREG) 3.125 MG tablet Take 1 tablet (3.125 mg total) by mouth 2 (two) times daily. 01/05/20  Yes BranchDorothe Pea, MD  DULoxetine (CYMBALTA) 60 MG capsule Take 60 mg by mouth daily.   Yes [provider]  methocarbamol (ROBAXIN) 500 MG tablet Take 1-2 tablets by mouth 3 (three) times daily as needed. 10/10/20  Yes [provider]  nitroGLYCERIN (NITROSTAT) 0.4 MG SL tablet PLACE 1 TABLET UNDER THE TONGUE EVERY 5 MINUTES FOR 3 DOSES AS NEEDED FOR CHEST PAIN 01/05/20  Yes Branch, Dorothe Pea, MD  pantoprazole (PROTONIX) 40 MG tablet Take 40 mg by mouth daily.   Yes [provider]  rosuvastatin (CRESTOR) 40 MG tablet Take 1 tablet (40 mg total) by mouth every evening. 01/05/20  Yes Branch, Dorothe Pea, MD  traMADol (ULTRAM) 50 MG tablet Take 50 mg by mouth as needed.   Yes [provider]  fluconazole (DIFLUCAN) 100 MG tablet Take by mouth. Patient not taking: No sig reported 10/10/20   [provider]  mirtazapine (REMERON) 15 MG tablet Take  by mouth. Patient not taking: No sig reported 07/12/20   [provider]  valACYclovir (VALTREX) 1000 MG tablet Take 1,000 mg by mouth 2 (two) times daily. Patient not taking: No sig reported 07/08/20   [provider]     Allergies:     Allergies  Allergen Reactions  . Bee Venom Other (See Comments)    Pus sites at injection  . Elastic Bandages & [Zinc] Hives  . Mango Flavor Hives    All mango  . Adhesive [Tape] Itching and Swelling    Please use paper tape  . Triple Antibiotic [Bacitracin-Neomycin-Polymyxin] Rash     Physical Exam:   Vitals  Blood pressure 116/69, pulse 71, temperature 98.1 F (36.7 C), temperature source Oral, resp. rate 18, SpO2 99 %.   1. General developed female, laying in bed, no apparent distress  2. Normal affect and insight, Not Suicidal or Homicidal, Awake Alert, Oriented X 3.  3. No F.N deficits, ALL C.Nerves Intact, Strength 5/5 all 4 extremities, Sensation intact all 4 extremities, Plantars down going.  4. Ears and Eyes appear Normal, Conjunctivae clear, PERRLA. Moist Oral Mucosa.  5. Supple Neck, No JVD, No cervical lymphadenopathy appriciated, No Carotid Bruits.  6. Symmetrical Chest wall movement, Good air movement bilaterally, CTAB.  7. RRR, No Gallops, Rubs or Murmurs, No Parasternal Heave.  she does have reproducible chest pain in the left upper chest and left arm as well.  8. Positive Bowel Sounds, Abdomen Soft, No tenderness, No organomegaly appriciated,No rebound -guarding or rigidity.  9.  No Cyanosis, Normal Skin Turgor, No Skin Rash or Bruise.  10. Good muscle tone,  joints appear normal , no effusions, Normal ROM.  Left fourth digit bandaged  11. No Palpable Lymph Nodes in Neck or Axillae    Data Review:    CBC Recent  Labs  Lab 11/09/20 1739  WBC 4.3  HGB 12.2  HCT 38.0  PLT 252  MCV 89.4  MCH 28.7  MCHC 32.1  RDW 13.9   ------------------------------------------------------------------------------------------------------------------  Chemistries  Recent Labs  Lab 11/09/20 1739  NA 138  K 4.0  CL 105  CO2 24  GLUCOSE 93  BUN 14  CREATININE 0.89  CALCIUM 8.8*   ------------------------------------------------------------------------------------------------------------------ CrCl cannot be calculated (Unknown ideal weight.). ------------------------------------------------------------------------------------------------------------------ No results for input(s): TSH, T4TOTAL, T3FREE, THYROIDAB in the last 72 hours.  Invalid input(s): FREET3  Coagulation profile Recent Labs  Lab 11/09/20 1739  INR 1.1   ------------------------------------------------------------------------------------------------------------------- No results for input(s): DDIMER in the last 72 hours. -------------------------------------------------------------------------------------------------------------------  Cardiac Enzymes No results for input(s): CKMB, TROPONINI, MYOGLOBIN in the last 168 hours.  Invalid input(s): CK ------------------------------------------------------------------------------------------------------------------    Component Value Date/Time   BNP 45.0 07/03/2016 1535     ---------------------------------------------------------------------------------------------------------------  Urinalysis    Component Value Date/Time   COLORURINE YELLOW 01/02/2020 1658   APPEARANCEUR HAZY (A) 01/02/2020 1658   LABSPEC 1.009 01/02/2020 1658   PHURINE 7.0 01/02/2020 1658   GLUCOSEU NEGATIVE 01/02/2020 1658   HGBUR SMALL (A) 01/02/2020 1658   BILIRUBINUR NEGATIVE 01/02/2020 1658   KETONESUR NEGATIVE 01/02/2020 1658   PROTEINUR NEGATIVE 01/02/2020 1658   NITRITE NEGATIVE 01/02/2020  1658   LEUKOCYTESUR LARGE (A) 01/02/2020 1658    ----------------------------------------------------------------------------------------------------------------   Imaging Results:    DG Chest Portable 1 View  Result Date: 11/09/2020 CLINICAL DATA:  Chest pain, shortness of breath. EXAM: PORTABLE CHEST 1 VIEW COMPARISON:  February 22, 2020. FINDINGS: The heart size and mediastinal contours are within normal limits. Both  lungs are clear. No pneumothorax or pleural effusion is noted. The visualized skeletal structures are unremarkable. IMPRESSION: No active disease. Electronically Signed   By: Lupita Raider M.D.   On: 11/09/2020 17:54    My personal review of EKG: Rhythm NSR, Rate  70 /min, QTc 415 , no Acute ST changes    Assessment & Plan:    Active Problems:   History of acute inferior wall MI   Coronary artery disease involving native heart without angina pectoris   Essential hypertension   S/P CABG x 3   Chest pain   Chest pain -Has nontypical features, as it is reproducible by palpation(she does have reproducible chest pain on palpation and with activity left upper chest and left arm as she has been carrying some heavy object at work) -He is ruled out high-sensitivity troponins 2>2. -Received full dose aspirin in ED. -Given her extensive cardiac history she will be admitted overnight for observation, and will request cardiology input for further recommendations.  CAD status post CABG x3 in 2018 -We will continue with aspirin, beta-blockers and statin  GERD -Continue with PPI  Anxiety/depression -Continue with home medications  Hypertension -Continue with Coreg  Left fourth digit paronychia -I&D by ED physician, to finish 5 days of doxycycline  DVT Prophylaxis Heparin  AM Labs Ordered, also please review Full Orders  Family Communication: Admission, patients condition and plan of care including tests being ordered have been discussed with the patient who indicate  understanding and agree with the plan and Code Status.  Code Status Full  Likely DC to  home  Condition GUARDED    Consults called: none, cardiology consult requested in Epic.    Admission status: observation    Time spent in minutes : 50 minutes   Huey Bienenstock M.D on 11/09/2020 at 9:29 PM   [facility] - Office  [phone]

## 2020-11-09 NOTE — ED Triage Notes (Signed)
Chest pain in the middle of chest x 1 week on and off. Has taken nitro with minimal relief.  Also swelling to tip of LT finger since Friday.

## 2020-11-10 ENCOUNTER — Observation Stay (HOSPITAL_BASED_OUTPATIENT_CLINIC_OR_DEPARTMENT_OTHER): Payer: 59

## 2020-11-10 ENCOUNTER — Encounter (HOSPITAL_COMMUNITY): Payer: Self-pay | Admitting: Internal Medicine

## 2020-11-10 DIAGNOSIS — Z951 Presence of aortocoronary bypass graft: Secondary | ICD-10-CM

## 2020-11-10 DIAGNOSIS — R079 Chest pain, unspecified: Secondary | ICD-10-CM | POA: Diagnosis not present

## 2020-11-10 DIAGNOSIS — I252 Old myocardial infarction: Secondary | ICD-10-CM

## 2020-11-10 DIAGNOSIS — R0789 Other chest pain: Secondary | ICD-10-CM | POA: Diagnosis not present

## 2020-11-10 DIAGNOSIS — I10 Essential (primary) hypertension: Secondary | ICD-10-CM | POA: Diagnosis not present

## 2020-11-10 DIAGNOSIS — E785 Hyperlipidemia, unspecified: Secondary | ICD-10-CM

## 2020-11-10 DIAGNOSIS — I25810 Atherosclerosis of coronary artery bypass graft(s) without angina pectoris: Secondary | ICD-10-CM | POA: Diagnosis not present

## 2020-11-10 LAB — BASIC METABOLIC PANEL
Anion gap: 7 (ref 5–15)
BUN: 13 mg/dL (ref 6–20)
CO2: 21 mmol/L — ABNORMAL LOW (ref 22–32)
Calcium: 8.5 mg/dL — ABNORMAL LOW (ref 8.9–10.3)
Chloride: 111 mmol/L (ref 98–111)
Creatinine, Ser: 0.81 mg/dL (ref 0.44–1.00)
GFR, Estimated: >60 mL/min (ref >60)
Glucose, Bld: 97 mg/dL (ref 70–99)
Potassium: 3.7 mmol/L (ref 3.5–5.1)
Sodium: 139 mmol/L (ref 135–145)

## 2020-11-10 LAB — LIPID PANEL
Cholesterol: 177 mg/dL (ref 0–200)
HDL: 37 mg/dL — ABNORMAL LOW (ref >40)
LDL Cholesterol: 117 mg/dL — ABNORMAL HIGH (ref 0–99)
Total CHOL/HDL Ratio: 4.8 RATIO
Triglycerides: 113 mg/dL (ref <150)
VLDL: 23 mg/dL (ref 0–40)

## 2020-11-10 LAB — CBC
HCT: 35.1 % — ABNORMAL LOW (ref 36.0–46.0)
Hemoglobin: 11.1 g/dL — ABNORMAL LOW (ref 12.0–15.0)
MCH: 28.6 pg (ref 26.0–34.0)
MCHC: 31.6 g/dL (ref 30.0–36.0)
MCV: 90.5 fL (ref 80.0–100.0)
Platelets: 237 10*3/uL (ref 150–400)
RBC: 3.88 MIL/uL (ref 3.87–5.11)
RDW: 14.3 % (ref 11.5–15.5)
WBC: 5.1 10*3/uL (ref 4.0–10.5)
nRBC: 0 % (ref 0.0–0.2)

## 2020-11-10 LAB — NM MYOCAR MULTI W/SPECT W/WALL MOTION / EF
LV dias vol: 63 mL (ref 46–106)
LV sys vol: 25 mL
Peak HR: 93 {beats}/min
RATE: 0.34
Rest HR: 71 {beats}/min
SDS: 2
SRS: 1
SSS: 3
TID: 1.11

## 2020-11-10 LAB — HIV ANTIBODY (ROUTINE TESTING W REFLEX): HIV Screen 4th Generation wRfx: NONREACTIVE

## 2020-11-10 LAB — TROPONIN I (HIGH SENSITIVITY): Troponin I (High Sensitivity): 2 ng/L (ref <18)

## 2020-11-10 MED ORDER — SODIUM CHLORIDE FLUSH 0.9 % IV SOLN
INTRAVENOUS | Status: AC
  Administered 2020-11-10: 10 mL via INTRAVENOUS
  Filled 2020-11-10: qty 10

## 2020-11-10 MED ORDER — TECHNETIUM TC 99M TETROFOSMIN IV KIT
10.0000 | PACK | Freq: Once | INTRAVENOUS | Status: AC | PRN
  Administered 2020-11-10: 10.5 via INTRAVENOUS

## 2020-11-10 MED ORDER — TECHNETIUM TC 99M TETROFOSMIN IV KIT
30.0000 | PACK | Freq: Once | INTRAVENOUS | Status: AC | PRN
  Administered 2020-11-10: 27 via INTRAVENOUS

## 2020-11-10 MED ORDER — REGADENOSON 0.4 MG/5ML IV SOLN
INTRAVENOUS | Status: AC
  Administered 2020-11-10: 0.4 mg via INTRAVENOUS
  Filled 2020-11-10: qty 5

## 2020-11-10 MED ORDER — EZETIMIBE 10 MG PO TABS
10.0000 mg | ORAL_TABLET | Freq: Every day | ORAL | Status: DC
  Administered 2020-11-10: 10 mg via ORAL
  Filled 2020-11-10: qty 1

## 2020-11-10 MED ORDER — DOXYCYCLINE HYCLATE 100 MG PO TABS: 100.0000 mg | ORAL_TABLET | Freq: Two times a day (BID) | ORAL | 0 refills | Status: AC

## 2020-11-10 MED ORDER — EZETIMIBE 10 MG PO TABS: 10.0000 mg | ORAL_TABLET | Freq: Every day | ORAL | 1 refills | Status: DC

## 2020-11-10 NOTE — Discharge Summary (Signed)
Physician Discharge Summary  Carolyn Gaines XID:[id number] DOB: January 25, 1966 DOA: 11/09/2020  PCP: Benita Stabile, MD  Admit date: 11/09/2020 Discharge date: 11/10/2020  Admitted From:  Home  Disposition: Home   Recommendations for Outpatient Follow-up:  1. Follow up with cardiology as scheduled 2. Follow up with PCP in 1-2 weeks for recheck   Discharge Condition: STABLE   CODE STATUS: FULL  DIET: Heart  Healthy    Brief Hospitalization Summary: Please see all hospital notes, images, labs for full details of the hospitalization. 55 y.o. female, with past medical history of CAD, status post stent, then status post CABG in 2018, tension, hyperlipidemia, depression and anxiety, patient presents to ED secondary to complaints of chest pain, reports an ongoing for last week, intermittent, sided lower radiating to the left arm, reported worsening by activity and lifting heavy object at work, she does report some dyspnea as well, forces of an aching quality, she does report some mild accompanying dyspnea, denies any fever, chills, nausea or diaphoresis, patient had normal Lexiscan in 2020, as well she does report fourth finger pain and left arm, and test what brought her to the hospital as well. -In ED her troponins were negative, EKG nonacute, patient had I&D of the left fourth finger paronychia done in ED, triage hospitalist consulted to admit for further work-up for chest pain.  Hospital Course Chest pain -Has nontypical features, as it is reproducible by palpation(she does have reproducible chest pain on palpation and with activity left upper chest and left arm as she has been carrying some heavy object at work) -He is ruled out high-sensitivity troponins 2>2. -Received full dose aspirin in ED. -Given her extensive cardiac history she was admitted overnight for observation, and requested cardiology input for further recommendations. Pt had a low risk inpatient myoview study done on 11/10/20.   Cardiology team said that she could discharge home with outpatient follow up.   Pt was started on zetia 10 mg daily to help improve lipid profile.   CAD status post CABG x3 in 2018 -We will continue with aspirin, beta-blockers and statin  GERD -Continue with PPI  Anxiety/depression -Continue with home medications  Hypertension -Continue with Coreg  Left fourth digit paronychia -I&D by ED physician, to finish 5 days of doxycycline  DVT Prophylaxis Heparin  Discharge Diagnoses:  Active Problems:   History of acute inferior wall MI   Coronary artery disease involving native heart without angina pectoris   Essential hypertension   S/P CABG x 3   Chest pain   Discharge Instructions:  Allergies as of 11/10/2020      Reactions   Bee Venom Other (See Comments)   Pus sites at injection   Elastic Bandages & [zinc] Hives   Mango Flavor Hives   All mango   Adhesive [tape] Itching, Swelling   Please use paper tape   Triple Antibiotic [bacitracin-neomycin-polymyxin] Rash      Medication List    STOP taking these medications   fluconazole 100 MG tablet Commonly known as: DIFLUCAN   mirtazapine 15 MG tablet Commonly known as: REMERON   valACYclovir 1000 MG tablet Commonly known as: VALTREX     TAKE these medications   acetaminophen 500 MG tablet Commonly known as: TYLENOL Take 1,000 mg by mouth every 6 (six) hours as needed for mild pain.   aspirin EC 81 MG tablet Take 81 mg by mouth daily.   carvedilol 3.125 MG tablet Commonly known as: COREG Take 1 tablet (3.125 mg total)  by mouth 2 (two) times daily.   doxycycline 100 MG tablet Commonly known as: VIBRA-TABS Take 1 tablet (100 mg total) by mouth every 12 (twelve) hours for 5 days.   DULoxetine 60 MG capsule Commonly known as: CYMBALTA Take 60 mg by mouth daily.   ezetimibe 10 MG tablet Commonly known as: ZETIA Take 1 tablet (10 mg total) by mouth daily. Start taking on: November 11, 2020    methocarbamol 500 MG tablet Commonly known as: ROBAXIN Take 1-2 tablets by mouth 3 (three) times daily as needed.   nitroGLYCERIN 0.4 MG SL tablet Commonly known as: NITROSTAT PLACE 1 TABLET UNDER THE TONGUE EVERY 5 MINUTES FOR 3 DOSES AS NEEDED FOR CHEST PAIN   pantoprazole 40 MG tablet Commonly known as: PROTONIX Take 40 mg by mouth daily.   rosuvastatin 40 MG tablet Commonly known as: CRESTOR Take 1 tablet (40 mg total) by mouth every evening.   traMADol 50 MG tablet Commonly known as: ULTRAM Take 50 mg by mouth as needed.       Follow-up Information    Netta Neat., NP Follow up on 12/02/2020.   Specialty: Cardiology Why: Cardiology Hospital Follow-up on 12/02/2020 at 2:00 PM with Nena Polio, NP (works with Dr. Wyline Mood) Contact information: [address] [place] [address] [phone]              Allergies  Allergen Reactions  . Bee Venom Other (See Comments)    Pus sites at injection  . Elastic Bandages & [Zinc] Hives  . Mango Flavor Hives    All mango  . Adhesive [Tape] Itching and Swelling    Please use paper tape  . Triple Antibiotic [Bacitracin-Neomycin-Polymyxin] Rash   Allergies as of 11/10/2020      Reactions   Bee Venom Other (See Comments)   Pus sites at injection   Elastic Bandages & [zinc] Hives   Mango Flavor Hives   All mango   Adhesive [tape] Itching, Swelling   Please use paper tape   Triple Antibiotic [bacitracin-neomycin-polymyxin] Rash      Medication List    STOP taking these medications   fluconazole 100 MG tablet Commonly known as: DIFLUCAN   mirtazapine 15 MG tablet Commonly known as: REMERON   valACYclovir 1000 MG tablet Commonly known as: VALTREX     TAKE these medications   acetaminophen 500 MG tablet Commonly known as: TYLENOL Take 1,000 mg by mouth every 6 (six) hours as needed for mild pain.   aspirin EC 81 MG tablet Take 81 mg by mouth daily.   carvedilol 3.125 MG tablet Commonly  known as: COREG Take 1 tablet (3.125 mg total) by mouth 2 (two) times daily.   doxycycline 100 MG tablet Commonly known as: VIBRA-TABS Take 1 tablet (100 mg total) by mouth every 12 (twelve) hours for 5 days.   DULoxetine 60 MG capsule Commonly known as: CYMBALTA Take 60 mg by mouth daily.   ezetimibe 10 MG tablet Commonly known as: ZETIA Take 1 tablet (10 mg total) by mouth daily. Start taking on: November 11, 2020   methocarbamol 500 MG tablet Commonly known as: ROBAXIN Take 1-2 tablets by mouth 3 (three) times daily as needed.   nitroGLYCERIN 0.4 MG SL tablet Commonly known as: NITROSTAT PLACE 1 TABLET UNDER THE TONGUE EVERY 5 MINUTES FOR 3 DOSES AS NEEDED FOR CHEST PAIN   pantoprazole 40 MG tablet Commonly known as: PROTONIX Take 40 mg by mouth daily.   rosuvastatin  40 MG tablet Commonly known as: CRESTOR Take 1 tablet (40 mg total) by mouth every evening.   traMADol 50 MG tablet Commonly known as: ULTRAM Take 50 mg by mouth as needed.       Procedures/Studies: NM Myocar Multi W/Spect W/Wall Motion / EF  Result Date: 11/10/2020  Lexiscan stress is electrically negative  Myovue scan shows normal perfusion No significant ischemia or scar  LVEF calculated at 60%  Overall low risk study.    DG Chest Portable 1 View  Result Date: 11/09/2020 CLINICAL DATA:  Chest pain, shortness of breath. EXAM: PORTABLE CHEST 1 VIEW COMPARISON:  February 22, 2020. FINDINGS: The heart size and mediastinal contours are within normal limits. Both lungs are clear. No pneumothorax or pleural effusion is noted. The visualized skeletal structures are unremarkable. IMPRESSION: No active disease. Electronically Signed   By: Lupita Raider M.D.   On: 11/09/2020 17:54      Subjective: Pt reports feeling much better. No complaints.    Discharge Exam: Vitals:   11/10/20 0452 11/10/20 1323  BP: 109/66 122/66  Pulse: 74 74  Resp:    Temp: 97.9 F (36.6 C) 98.4 F (36.9 C)  SpO2: 98% 95%    Vitals:   11/09/20 2154 11/09/20 2201 11/10/20 0452 11/10/20 1323  BP: 118/83  109/66 122/66  Pulse: 75  74 74  Resp:      Temp:   97.9 F (36.6 C) 98.4 F (36.9 C)  TempSrc:   Oral Oral  SpO2: 99%  98% 95%  Weight:  79.8 kg    Height:  5\' 3"  (1.6 m)      General: Pt is alert, awake, not in acute distress Cardiovascular: RRR, S1/S2 +, no rubs, no gallops Respiratory: CTA bilaterally, no wheezing, no rhonchi Abdominal: Soft, NT, ND, bowel sounds + Extremities: no edema, no cyanosis   The results of significant diagnostics from this hospitalization (including imaging, microbiology, ancillary and laboratory) are listed below for reference.     Microbiology: Recent Results (from the past 240 hour(s))  Resp Panel by RT-PCR (Flu A&B, Covid) Nasopharyngeal Swab     Status: None   Collection Time: 11/09/20  6:50 PM   Specimen: Nasopharyngeal Swab; Nasopharyngeal(NP) swabs in vial transport medium  Result Value Ref Range Status   SARS Coronavirus 2 by RT PCR NEGATIVE NEGATIVE Final    Comment: (NOTE) SARS-CoV-2 target nucleic acids are NOT DETECTED.  The SARS-CoV-2 RNA is generally detectable in upper respiratory specimens during the acute phase of infection. The lowest concentration of SARS-CoV-2 viral copies this assay can detect is 138 copies/mL. A negative result does not preclude SARS-Cov-2 infection and should not be used as the sole basis for treatment or other patient management decisions. A negative result may occur with  improper specimen collection/handling, submission of specimen other than nasopharyngeal swab, presence of viral mutation(s) within the areas targeted by this assay, and inadequate number of viral copies(<138 copies/mL). A negative result must be combined with clinical observations, patient history, and epidemiological information. The expected result is Negative.  Fact Sheet for Patients:  11/11/20  Fact Sheet for  Healthcare Providers:  BloggerCourse.com  This test is no t yet approved or cleared by the [place] FDA and  has been authorized for detection and/or diagnosis of SARS-CoV-2 by FDA under an Emergency Use Authorization (EUA). This EUA will remain  in effect (meaning this test can be used) for the duration of the COVID-19 declaration under Section 564(b)(1) of the  Act, 21 U.S.C.section 360bbb-3(b)(1), unless the authorization is terminated  or revoked sooner.       Influenza A by PCR NEGATIVE NEGATIVE Final   Influenza B by PCR NEGATIVE NEGATIVE Final    Comment: (NOTE) The Xpert Xpress SARS-CoV-2/FLU/RSV plus assay is intended as an aid in the diagnosis of influenza from Nasopharyngeal swab specimens and should not be used as a sole basis for treatment. Nasal washings and aspirates are unacceptable for Xpert Xpress SARS-CoV-2/FLU/RSV testing.  Fact Sheet for Patients: BloggerCourse.comhttps://www.fda.gov/media/152166/download  Fact Sheet for Healthcare Providers: SeriousBroker.ithttps://www.fda.gov/media/152162/download  This test is not yet approved or cleared by the [place] FDA and has been authorized for detection and/or diagnosis of SARS-CoV-2 by FDA under an Emergency Use Authorization (EUA). This EUA will remain in effect (meaning this test can be used) for the duration of the COVID-19 declaration under Section 564(b)(1) of the Act, 21 U.S.C. section 360bbb-3(b)(1), unless the authorization is terminated or revoked.  Performed at [facility], [address] [place], [place] [id number]      Labs: BNP (last 3 results) No results for input(s): BNP in the last 8760 hours. Basic Metabolic Panel: Recent Labs  Lab 11/09/20 1739 11/10/20 0404  NA 138 139  K 4.0 3.7  CL 105 111  CO2 24 21*  GLUCOSE 93 97  BUN 14 13  CREATININE 0.89 0.81  CALCIUM 8.8* 8.5*   Liver Function Tests: No results for input(s): AST, ALT, ALKPHOS, BILITOT, PROT, ALBUMIN in the last 168  hours. No results for input(s): LIPASE, AMYLASE in the last 168 hours. No results for input(s): AMMONIA in the last 168 hours. CBC: Recent Labs  Lab 11/09/20 1739 11/10/20 0404  WBC 4.3 5.1  HGB 12.2 11.1*  HCT 38.0 35.1*  MCV 89.4 90.5  PLT 252 237   Cardiac Enzymes: No results for input(s): CKTOTAL, CKMB, CKMBINDEX, TROPONINI in the last 168 hours. BNP: Invalid input(s): POCBNP CBG: Recent Labs  Lab 11/09/20 1736  GLUCAP 78   D-Dimer No results for input(s): DDIMER in the last 72 hours. Hgb A1c No results for input(s): HGBA1C in the last 72 hours. Lipid Profile Recent Labs    11/09/20 1919 11/10/20 0404  CHOL 188 177  HDL 39* 37*  LDLCALC 125* 604117*  TRIG 118 113  CHOLHDL 4.8 4.8   Thyroid function studies No results for input(s): TSH, T4TOTAL, T3FREE, THYROIDAB in the last 72 hours.  Invalid input(s): FREET3 Anemia work up No results for input(s): VITAMINB12, FOLATE, FERRITIN, TIBC, IRON, RETICCTPCT in the last 72 hours. Urinalysis    Component Value Date/Time   COLORURINE YELLOW 01/02/2020 1658   APPEARANCEUR HAZY (A) 01/02/2020 1658   LABSPEC 1.009 01/02/2020 1658   PHURINE 7.0 01/02/2020 1658   GLUCOSEU NEGATIVE 01/02/2020 1658   HGBUR SMALL (A) 01/02/2020 1658   BILIRUBINUR NEGATIVE 01/02/2020 1658   KETONESUR NEGATIVE 01/02/2020 1658   PROTEINUR NEGATIVE 01/02/2020 1658   NITRITE NEGATIVE 01/02/2020 1658   LEUKOCYTESUR LARGE (A) 01/02/2020 1658   Sepsis Labs Invalid input(s): PROCALCITONIN,  WBC,  LACTICIDVEN Microbiology Recent Results (from the past 240 hour(s))  Resp Panel by RT-PCR (Flu A&B, Covid) Nasopharyngeal Swab     Status: None   Collection Time: 11/09/20  6:50 PM   Specimen: Nasopharyngeal Swab; Nasopharyngeal(NP) swabs in vial transport medium  Result Value Ref Range Status   SARS Coronavirus 2 by RT PCR NEGATIVE NEGATIVE Final    Comment: (NOTE) SARS-CoV-2 target nucleic acids are NOT DETECTED.  The SARS-CoV-2 RNA is  generally detectable in upper respiratory specimens during the acute phase of infection. The lowest concentration of SARS-CoV-2 viral copies this assay can detect is 138 copies/mL. A negative result does not preclude SARS-Cov-2 infection and should not be used as the sole basis for treatment or other patient management decisions. A negative result may occur with  improper specimen collection/handling, submission of specimen other than nasopharyngeal swab, presence of viral mutation(s) within the areas targeted by this assay, and inadequate number of viral copies(<138 copies/mL). A negative result must be combined with clinical observations, patient history, and epidemiological information. The expected result is Negative.  Fact Sheet for Patients:  BloggerCourse.com  Fact Sheet for Healthcare Providers:  SeriousBroker.it  This test is no t yet approved or cleared by the [place] FDA and  has been authorized for detection and/or diagnosis of SARS-CoV-2 by FDA under an Emergency Use Authorization (EUA). This EUA will remain  in effect (meaning this test can be used) for the duration of the COVID-19 declaration under Section 564(b)(1) of the Act, 21 U.S.C.section 360bbb-3(b)(1), unless the authorization is terminated  or revoked sooner.       Influenza A by PCR NEGATIVE NEGATIVE Final   Influenza B by PCR NEGATIVE NEGATIVE Final    Comment: (NOTE) The Xpert Xpress SARS-CoV-2/FLU/RSV plus assay is intended as an aid in the diagnosis of influenza from Nasopharyngeal swab specimens and should not be used as a sole basis for treatment. Nasal washings and aspirates are unacceptable for Xpert Xpress SARS-CoV-2/FLU/RSV testing.  Fact Sheet for Patients: BloggerCourse.com  Fact Sheet for Healthcare Providers: SeriousBroker.it  This test is not yet approved or cleared by the [place] FDA and has been authorized for detection and/or diagnosis of SARS-CoV-2 by FDA under an Emergency Use Authorization (EUA). This EUA will remain in effect (meaning this test can be used) for the duration of the COVID-19 declaration under Section 564(b)(1) of the Act, 21 U.S.C. section 360bbb-3(b)(1), unless the authorization is terminated or revoked.  Performed at [facility], [address] [place], [place] [address]     Time coordinating discharge:   SIGNED:  Standley Dakins, MD  [facility] 11/10/2020, 4:46 PM How to contact the [facility] Attending or Consulting provider 7A - 7P or covering provider during after hours 7P -7A, for this patient?  1. Check the care team in [facility] and look for a) attending/consulting [facility] provider listed and b) the [facility] team listed 2. Log into [email or web address] and use [place]'s universal password to access. If you do not have the password, please contact the hospital operator. 3. Locate the [facility] provider you are looking for under [facility] and page to a number that you can be directly reached. 4. If you still have difficulty reaching the provider, please page the [facility] (Director on Call) for the Hospitalists listed on amion for assistance.

## 2020-11-10 NOTE — Consult Note (Addendum)
Cardiology Consultation:   Patient ID: CHARENE Gaines MRN: [id number]; DOB: 11/19/1965  Admit date: 11/09/2020 Date of Consult: 11/10/2020  PCP:  Benita Stabile, MD   [facility]  Cardiologist:  Dina Rich, MD   Patient Profile:   Carolyn Gaines is a 55 y.o. female with past medical history of CAD (s/p prior stenting to RCA, CABG in 09/2016 with LIMA-LAD, SVG-OM and SVG-RCA, low-risk NST in 01/2019), HTN, HLD, anemia and fibromyalgia who is being seen today for the evaluation of chest pain at the request of Dr. Randol Kern.  History of Present Illness:   [name] was last examined by Dr. Wyline Mood in 12/2019 and reported still having some neuropathic pain along her CABG site but denied any recent exertional chest pain. Had recently been evaluated in the ED for chest pain after receiving a sunburn to her chest and ER work-up was unrevealing. Given her recent low-risk stress test, further cardiac testing was not pursued.  She presented to [facility] ED on 11/09/2020 for evaluation of intermittent chest pain over the past week. 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 Her pain can last for minutes to hours at a time and is improved if she presses in on the left side of her chest. She does report some exertional pain when carrying boxes. Also reports episodes of numbness and tingling down her left arm and this is concerning to her as it resembles her prior symptoms in 2018. She was previously hiking trails without any symptoms but reports the last time she was this active was last year. She has experienced some dyspnea on exertion but denies any orthopnea, PND or palpitations. She does experience some lower extremity edema after working throughout the day.  She also reported pain along her left fourth  digit which was felt to be consistent with paronychia and underwent I&D while in the ED.  Initial labs showed WBC 4.3, Hgb 12.2, platelets 252, Na+ 138, K+ 4.0 and creatinine 0.89.  COVID-negative. Initial and cyclic troponin values have been negative at 2. FLP shows total cholesterol 177, triglycerides 113, HDL 37 and LDL 117. CXR with no active disease.  EKG shows normal sinus rhythm, heart rate 70 with no acute ST changes when compared to prior tracings.    Past Medical History:  Diagnosis Date  . Anemia    low iron  . Anxiety   . Arthritis   . Coronary artery disease    a. s/p prior stenting to RCA b. CABG in 09/2016 with LIMA-LAD, SVG-OM and SVG-RCA c. low-risk NST in 01/2019  . Depression   . Fibromyalgia   . Gallstones   . GERD (gastroesophageal reflux disease)   . Headache    migraines  . MI (myocardial infarction) (HCC) 01/2016  . Peptic ulcer   . Seizures (HCC)    as a baby    Past Surgical History:  Procedure Laterality Date  . ABDOMINAL HYSTERECTOMY    . CARDIAC CATHETERIZATION N/A 02/19/2016   Procedure: Left Heart Cath and Coronary Angiography;  Surgeon: Yates Decamp, MD;  Location: [facility] INVASIVE CV LAB;  Service: Cardiovascular;  Laterality: N/A;  . CARDIAC CATHETERIZATION N/A 02/19/2016   Procedure: Coronary Stent Intervention;  Surgeon: Yates Decamp, MD;  Location: [facility] INVASIVE CV LAB;  Service: Cardiovascular;  Laterality: N/A;  .  CESAREAN SECTION     x 2  . CHOLECYSTECTOMY N/A 01/25/2017   Procedure: LAPAROSCOPIC CHOLECYSTECTOMY;  Surgeon: Franky MachoJenkins, Mark, MD;  Location: [facility];  Service: General;  Laterality: N/A;  . COLONOSCOPY    . CORONARY ARTERY BYPASS GRAFT N/A 10/26/2016   Procedure: CORONARY ARTERY BYPASS GRAFTING (CABG), ON PUMP, TIMES THREE, USING LEFT INTERNAL MAMMARY ARTERY AND ENDOSCOPICALLY HARVESTED RIGHT GREATER SAPHENOUS VEIN;  Surgeon: Alleen BorneBryan K Bartle, MD;  Location: [facility] OR;  Service: Open Heart Surgery;  Laterality: N/A;  LIMA-LAD SVG-RCA SVG-OM  .  ESOPHAGOGASTRODUODENOSCOPY    . LEFT HEART CATH AND CORONARY ANGIOGRAPHY N/A 10/22/2016   Procedure: Left Heart Cath and Coronary Angiography;  Surgeon: Lennette Biharihomas A Kelly, MD;  [facility];  Service: Cardiovascular;  Laterality: N/A;  . STERNAL WIRES REMOVAL N/A 07/11/2017   Procedure: STERNAL WIRES REMOVAL;  Surgeon: Alleen BorneBartle, Bryan K, MD;  Location: [facility] OR;  Service: Thoracic;  Laterality: N/A;  . TEE WITHOUT CARDIOVERSION N/A 10/26/2016   Procedure: TRANSESOPHAGEAL ECHOCARDIOGRAM (TEE);  Surgeon: Alleen BorneBryan K Bartle, MD;  [facility];  Service: Open Heart Surgery;  Laterality: N/A;  . WRIST FRACTURE SURGERY Left    3 times     Home Medications:  Prior to Admission medications   Medication Sig Start Date End Date Taking? Authorizing Provider  acetaminophen (TYLENOL) 500 MG tablet Take 1,000 mg by mouth every 6 (six) hours as needed for mild pain.   Yes [provider]  aspirin EC 81 MG tablet Take 81 mg by mouth daily.   Yes [provider]  carvedilol (COREG) 3.125 MG tablet Take 1 tablet (3.125 mg total) by mouth 2 (two) times daily. 01/05/20  Yes BranchDorothe Pea, Jonathan F, MD  DULoxetine (CYMBALTA) 60 MG capsule Take 60 mg by mouth daily.   Yes [provider]  methocarbamol (ROBAXIN) 500 MG tablet Take 1-2 tablets by mouth 3 (three) times daily as needed. 10/10/20  Yes [provider]  nitroGLYCERIN (NITROSTAT) 0.4 MG SL tablet PLACE 1 TABLET UNDER THE TONGUE EVERY 5 MINUTES FOR 3 DOSES AS NEEDED FOR CHEST PAIN 01/05/20  Yes Branch, Dorothe PeaJonathan F, MD  pantoprazole (PROTONIX) 40 MG tablet Take 40 mg by mouth daily.   Yes [provider]  rosuvastatin (CRESTOR) 40 MG tablet Take 1 tablet (40 mg total) by mouth every evening. 01/05/20  Yes Branch, Dorothe PeaJonathan F, MD  traMADol (ULTRAM) 50 MG tablet Take 50 mg by mouth as needed.   Yes [provider]  fluconazole (DIFLUCAN) 100 MG tablet Take by mouth. Patient not taking: No sig reported 10/10/20    [provider]  mirtazapine (REMERON) 15 MG tablet Take by mouth. Patient not taking: No sig reported 07/12/20   [provider]  valACYclovir (VALTREX) 1000 MG tablet Take 1,000 mg by mouth 2 (two) times daily. Patient not taking: No sig reported 07/08/20   [provider]    Inpatient Medications: Scheduled Meds: . aspirin EC  81 mg Oral Daily  . carvedilol  3.125 mg Oral BID  . doxycycline  100 mg Oral Q12H  . DULoxetine  60 mg Oral Daily  . heparin  5,000 Units Subcutaneous Q8H  . pantoprazole  40 mg Oral Daily  . rosuvastatin  40 mg Oral QPM   Continuous Infusions: . sodium chloride 50 mL/hr at 11/09/20 2116   PRN Meds: acetaminophen, morphine injection, nitroGLYCERIN, ondansetron (ZOFRAN) IV  Allergies:    Allergies  Allergen Reactions  . Bee Venom Other (See  Comments)    Pus sites at injection  . Elastic Bandages & [Zinc] Hives  . Mango Flavor Hives    All mango  . Adhesive [Tape] Itching and Swelling    Please use paper tape  . Triple Antibiotic [Bacitracin-Neomycin-Polymyxin] Rash    Social History:   Social History   Socioeconomic History  . Marital status: Widowed    Spouse name: Not on file  . Number of children: 2  . Years of education: Not on file  . Highest education level: Not on file  Occupational History  . Occupation: Education officer, community  Tobacco Use  . Smoking status: Former Smoker    Quit date: 03/21/2011    Years since quitting: 9.6  . Smokeless tobacco: Never Used  Vaping Use  . Vaping Use: Never used  Substance and Sexual Activity  . Alcohol use: Yes    Comment: rare  . Drug use: No  . Sexual activity: Yes    Birth control/protection: Surgical    Comment: hyst  Other Topics Concern  . Not on file  Social History Narrative  . Not on file   Social Determinants of Health   Financial Resource Strain: Not on file  Food Insecurity: Not on file  Transportation Needs: Not on file  Physical Activity:  Not on file  Stress: Not on file  Social Connections: Not on file  Intimate Partner Violence: Not on file    Family History:    Family History  Problem Relation Age of Onset  . COPD Mother   . Arthritis Mother   . Heart disease Mother   . Stroke Mother   . Psoriasis Father   . Heart disease Father   . Diabetes Maternal Grandmother   . Diabetes Maternal Aunt   . Diabetes Maternal Aunt      ROS:  Please see the history of present illness.   All other ROS reviewed and negative.     Physical Exam/Data:   Vitals:   11/09/20 1856 11/09/20 2154 11/09/20 2201 11/10/20 0452  BP: 116/69 118/83  109/66  Pulse: 71 75  74  Resp: 18     Temp:    97.9 F (36.6 C)  TempSrc:    Oral  SpO2: 99% 99%  98%  Weight:   79.8 kg   Height:   5\' 3"  (1.6 m)     Intake/Output Summary (Last 24 hours) at 11/10/2020 0927 Last data filed at 11/10/2020 0527 Gross per 24 hour  Intake 720.79 ml  Output --  Net 720.79 ml   Last 3 Weights 11/09/2020 01/05/2020 01/02/2020  Weight (lbs) 175 lb 14.8 oz 175 lb 170 lb  Weight (kg) 79.8 kg 79.379 kg 77.111 kg     Body mass index is 31.16 kg/m.  General:  Well nourished, well developed female appearing in no acute distress.  HEENT: normal Lymph: no adenopathy Neck: no JVD Endocrine:  No thryomegaly Vascular: No carotid bruits; FA pulses 2+ bilaterally without bruits  Cardiac:  normal S1, S2; RRR; no murmur. Tender to palpation along left pectoral region.  Lungs:  clear to auscultation bilaterally, no wheezing, rhonchi or rales  Abd: soft, nontender, no hepatomegaly  Ext: Trace ankle edema bilaterally.  Musculoskeletal:  No deformities, BUE and BLE strength normal and equal Skin: warm and dry  Neuro:  CNs 2-12 intact, no focal abnormalities noted Psych:  Normal affect   EKG:  The EKG was personally reviewed and demonstrates: NSR, HR 70 with no acute ST changes  when compared to prior tracings.  Telemetry:  Telemetry was personally reviewed and  demonstrates: NSR, HR in 60's to 70's. No significant arrhythmias.   Relevant CV Studies:  NST: 01/2019  There was no ST segment deviation noted during stress.  The study is normal. There are no perfusion defects consistent with prior infarct or current ischemia.  This is a low risk study.  The left ventricular ejection fraction is hyperdynamic (>65%).   Echocardiogram: 12/2018 IMPRESSIONS    1. The left ventricle has normal systolic function with an ejection  fraction of 60-65%. The cavity size was normal. Left ventricular diastolic  parameters were normal.  2. The right ventricle has normal systolic function. The cavity was  normal. There is no increase in right ventricular wall thickness.  3. Mild thickening of the mitral valve leaflet.  4. The tricuspid valve is grossly normal.  5. The aortic valve is tricuspid. Moderate thickening of the aortic  valve. Sclerosis without any evidence of stenosis of the aortic valve.   Laboratory Data:  High Sensitivity Troponin:   Recent Labs  Lab 11/09/20 1739 11/09/20 1919 11/09/20 2112 11/09/20 2318  TROPONINIHS 2 2 2 2      Chemistry Recent Labs  Lab 11/09/20 1739 11/10/20 0404  NA 138 139  K 4.0 3.7  CL 105 111  CO2 24 21*  GLUCOSE 93 97  BUN 14 13  CREATININE 0.89 0.81  CALCIUM 8.8* 8.5*  GFRNONAA >60 >60  ANIONGAP 9 7    No results for input(s): PROT, ALBUMIN, AST, ALT, ALKPHOS, BILITOT in the last 168 hours. Hematology Recent Labs  Lab 11/09/20 1739 11/10/20 0404  WBC 4.3 5.1  RBC 4.25 3.88  HGB 12.2 11.1*  HCT 38.0 35.1*  MCV 89.4 90.5  MCH 28.7 28.6  MCHC 32.1 31.6  RDW 13.9 14.3  PLT 252 237   BNPNo results for input(s): BNP, PROBNP in the last 168 hours.  DDimer No results for input(s): DDIMER in the last 168 hours.   Radiology/Studies:  DG Chest Portable 1 View  Result Date: 11/09/2020 CLINICAL DATA:  Chest pain, shortness of breath. EXAM: PORTABLE CHEST 1 VIEW COMPARISON:  February 22, 2020. FINDINGS: The heart size and mediastinal contours are within normal limits. Both lungs are clear. No pneumothorax or pleural effusion is noted. The visualized skeletal structures are unremarkable. IMPRESSION: No active disease. Electronically Signed   By: Lupita Raider M.D.   On: 11/09/2020 17:54     Assessment and Plan:   1. Chest Pain with Mixed Features - It is difficult to differentiate if her symptoms are secondary to a cardiac or MSK etiology as her pain sometimes occurs with exertion but at other times occurs with certain movement and is reproducible. She does have intermittent pain and paraesthesias along her left arm and reports this resembles her prior symptoms in 2018.  - EKG shows no acute ST changes and Hs Troponin values have been negative.  - Discussed with Dr. Tenny Craw and given her mixed symptoms, will plan for a repeat Lexiscan Myoview for ischemic evaluation (last evaluation was a low-risk study in 01/2019). If low-risk, would focus on medical therapy with consideration of adding low-dose Imdur since her pain has been responsive to SL NTG at times.   2. CAD - She is s/p CABG in 09/2016 with LIMA-LAD, SVG-OM and SVG-RCA with most recent ischemic evaluation being a low-risk NST in 01/2019. Plan for repeat stress testing as outlined above.  - Continue ASA 81mg   daily, Coreg 3.125mg  BID and Crestor 40mg  daily.   3. HLD - FLP this admission shows total cholesterol 177, triglycerides 113, HDL 37 and LDL 117. Given that she is above goal and on Crestor 40mg  daily, would consider referral to the Lipid Clinic as an outpatient for consideration of PSCK-9 inhibitor therapy.   4. Paronychia - She underwent I&D while in the ED. Further management per the admitting team.   5. Anemia - Hgb stable at 11.1 and she denies any evidence of active bleeding.     Risk Assessment/Risk Scores:     HEAR Score (for undifferentiated chest pain):  HEAR Score: 4  For questions or updates,  please contact [facility] Please consult [email or web address] for contact info under    Signed, , PA-C  11/10/2020 9:27 AM  Patient seen and examined   I agree with findings as noted above by B Strader  Pt continues to have intermitt chest pain  Some is left sided with radiation to L arm  ON exam: Lungs are CTA Chest is tender to palpation. L arm:  No pain by movement of arm Abd is supple  Ext are without edema  Chest pain   Pt Appears to have a couple types of CP   Some is clearly musculoskeletal   Some is different, may be related to exertion.    Given Hx of CAD I would recomm a lexiscan myovue to r/o inducible ischemia  Lipids:   LDL is not controlled  Pt says she is taking rosuvastatin    I would add Zetia I also reviewed diet with her   Neds to eat more natural food that is without added sugar. Will follow as outpt   May need Repatha down the road if does not improve   Ellsworth Lennox MD

## 2020-11-10 NOTE — Discharge Instructions (Signed)

## 2020-11-14 ENCOUNTER — Institutional Professional Consult (permissible substitution): Payer: 59 | Admitting: Neurology

## 2020-12-02 ENCOUNTER — Ambulatory Visit: Payer: 59 | Admitting: Family Medicine

## 2020-12-27 NOTE — Progress Notes (Deleted)
Cardiology Office Note    Date:  12/27/2020   ID:  Carolyn Gaines, DOB [birth year], MRN [id number]   PCP:  Benita Stabile, MD   [facility]  Cardiologist:  Dina Rich, MD *** Advanced Practice Provider:  No care team member to display Electrophysiologist:  None   [phone]   No chief complaint on file.   History of Present Illness:  Carolyn Gaines is a 55 y.o. 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  Troponins negative EKG without change.  Lexiscan Myoview normal without ischemia LVEF 60%.   Past Medical History:  Diagnosis Date  . Anemia    low iron  . Anxiety   . Arthritis   . Coronary artery disease    a. s/p prior stenting to RCA b. CABG in 09/2016 with LIMA-LAD, SVG-OM and SVG-RCA c. low-risk NST in 01/2019  . Depression   . Fibromyalgia   . Gallstones   . GERD (gastroesophageal reflux disease)   . Headache    migraines  . MI (myocardial infarction) (HCC) 01/2016  . Peptic ulcer   . Seizures (HCC)    as a baby    Past Surgical History:  Procedure Laterality Date  . ABDOMINAL HYSTERECTOMY    . CARDIAC CATHETERIZATION N/A 02/19/2016   Procedure: Left Heart Cath and Coronary Angiography;  Surgeon: Yates Decamp, MD;  Location: [facility] INVASIVE CV LAB;  Service: Cardiovascular;  Laterality: N/A;  . CARDIAC CATHETERIZATION N/A 02/19/2016   Procedure: Coronary Stent Intervention;  Surgeon: Yates Decamp, MD;  Location: [facility] INVASIVE CV LAB;  Service: Cardiovascular;  Laterality: N/A;  . CESAREAN SECTION     x 2  . CHOLECYSTECTOMY N/A 01/25/2017   Procedure: LAPAROSCOPIC CHOLECYSTECTOMY;  Surgeon: Franky Macho, MD;  Location: [facility] ORS;  Service:  General;  Laterality: N/A;  . COLONOSCOPY    . CORONARY ARTERY BYPASS GRAFT N/A 10/26/2016   Procedure: CORONARY ARTERY BYPASS GRAFTING (CABG), ON PUMP, TIMES THREE, USING LEFT INTERNAL MAMMARY ARTERY AND ENDOSCOPICALLY HARVESTED RIGHT GREATER SAPHENOUS VEIN;  Surgeon: Alleen Borne, MD;  Location: [facility] OR;  Service: Open Heart Surgery;  Laterality: N/A;  LIMA-LAD SVG-RCA SVG-OM  . ESOPHAGOGASTRODUODENOSCOPY    . LEFT HEART CATH AND CORONARY ANGIOGRAPHY N/A 10/22/2016   Procedure: Left Heart Cath and Coronary Angiography;  Surgeon: Lennette Bihari, MD;  Location: [facility] INVASIVE CV LAB;  Service: Cardiovascular;  Laterality: N/A;  . STERNAL WIRES REMOVAL N/A 07/11/2017   Procedure: STERNAL WIRES REMOVAL;  Surgeon: Alleen Borne, MD;  Location: [facility] OR;  Service: Thoracic;  Laterality: N/A;  . TEE WITHOUT CARDIOVERSION N/A 10/26/2016   Procedure: TRANSESOPHAGEAL ECHOCARDIOGRAM (TEE);  Surgeon: Alleen Borne, MD;  Location: [facility] OR;  Service: Open Heart Surgery;  Laterality: N/A;  . WRIST FRACTURE SURGERY Left    3 times    Current Medications: No outpatient medications have been marked as taking for the 01/09/21 encounter (Appointment) with Dyann Kief, PA-C.     Allergies:   Bee venom, Elastic bandages & [zinc], Mango flavor, Adhesive [tape], and Triple antibiotic [bacitracin-neomycin-polymyxin]   Social History   Socioeconomic History  . Marital status: Widowed    Spouse name: Not  on file  . Number of children: 2  . Years of education: Not on file  . Highest education level: Not on file  Occupational History  . Occupation: Education officer, community  Tobacco Use  . Smoking status: Former Smoker    Quit date: 03/21/2011    Years since quitting: 9.7  . Smokeless tobacco: Never Used  Vaping Use  . Vaping Use: Never used  Substance and Sexual Activity  . Alcohol use: Yes    Comment: rare  . Drug use: No  . Sexual activity: Yes    Birth control/protection: Surgical    Comment: hyst   Other Topics Concern  . Not on file  Social History Narrative  . Not on file   Social Determinants of Health   Financial Resource Strain: Not on file  Food Insecurity: Not on file  Transportation Needs: Not on file  Physical Activity: Not on file  Stress: Not on file  Social Connections: Not on file     Family History:  [name] ***family history includes Arthritis in her mother; COPD in her mother; Diabetes in her maternal aunt, maternal aunt, and maternal grandmother; Heart disease in her father and mother; Psoriasis in her father; Stroke in her mother.   ROS:   Please see the history of present illness.    ROS All other systems reviewed and are negative.   PHYSICAL EXAM:   VS:  There were no vitals taken for this visit.  Physical Exam  GEN: Well nourished, well developed, in no acute distress  HEENT: normal  Neck: no JVD, carotid bruits, or masses Cardiac:RRR; no murmurs, rubs, or gallops  Respiratory:  clear to auscultation bilaterally, normal work of breathing GI: soft, nontender, nondistended, + BS Ext: without cyanosis, clubbing, or edema, Good distal pulses bilaterally MS: no deformity or atrophy  Skin: warm and dry, no rash Neuro:  Alert and Oriented x 3, Strength and sensation are intact Psych: euthymic mood, full affect  Wt Readings from Last 3 Encounters:  11/09/20 175 lb 14.8 oz (79.8 kg)  01/05/20 175 lb (79.4 kg)  01/02/20 170 lb (77.1 kg)      Studies/Labs Reviewed:   EKG:  EKG is*** ordered today.  The ekg ordered today demonstrates ***  Recent Labs: 11/10/2020: BUN 13; Creatinine, Ser 0.81; Hemoglobin 11.1; Platelets 237; Potassium 3.7; Sodium 139   Lipid Panel    Component Value Date/Time   CHOL 177 11/10/2020 0404   TRIG 113 11/10/2020 0404   HDL 37 (L) 11/10/2020 0404   CHOLHDL 4.8 11/10/2020 0404   VLDL 23 11/10/2020 0404   LDLCALC 117 (H) 11/10/2020 0404    Additional studies/ records that were reviewed today include:   Lexiscan Myoview 10/2020  Lexiscan stress is electrically negative  Myovue scan shows normal perfusion No significant ischemia or scar  LVEF calculated at 60%  Overall low risk study.     Risk Assessment/Calculations:   {Does this patient have ATRIAL FIBRILLATION?[phone]}     ASSESSMENT:    No diagnosis found.   PLAN:  In order of problems listed above:    CAD status post stenting of the RCA, CABG 09/2016 with hospitalization for chest pain 10/2020. Chest pain most likely musculoskeletal with normal Lexiscan Myoview 11/10/2020    Hypertension  HLD    Shared Decision Making/Informed Consent   {Are you ordering a CV Procedure (e.g. stress test, cath, DCCV, TEE, etc)?   Press F2        :[phone]}  Medication Adjustments/Labs and Tests Ordered: Current medicines are reviewed at length with the patient today.  Concerns regarding medicines are outlined above.  Medication changes, Labs and Tests ordered today are listed in the Patient Instructions below. There are no Patient Instructions on file for this visit.   Elson Clan, PA-C  12/27/2020 12:17 PM    [facility] [address] [place], [place], [place]  [address] Phone: [phone]; Fax: [phone]

## 2021-01-02 ENCOUNTER — Telehealth: Payer: Self-pay | Admitting: Neurology

## 2021-01-02 ENCOUNTER — Institutional Professional Consult (permissible substitution): Payer: 59 | Admitting: Neurology

## 2021-01-02 NOTE — Telephone Encounter (Signed)
Dr. Frances Furbish is out, I LVM for patient to call back to reschedule.

## 2021-01-09 ENCOUNTER — Ambulatory Visit: Payer: 59 | Admitting: Physician Assistant

## 2021-01-09 DIAGNOSIS — I25810 Atherosclerosis of coronary artery bypass graft(s) without angina pectoris: Secondary | ICD-10-CM

## 2021-01-09 DIAGNOSIS — E785 Hyperlipidemia, unspecified: Secondary | ICD-10-CM

## 2021-01-09 DIAGNOSIS — I10 Essential (primary) hypertension: Secondary | ICD-10-CM

## 2021-01-25 ENCOUNTER — Other Ambulatory Visit: Payer: Self-pay

## 2021-01-25 ENCOUNTER — Emergency Department (HOSPITAL_COMMUNITY): Payer: 59

## 2021-01-25 ENCOUNTER — Emergency Department (HOSPITAL_COMMUNITY)
Admission: EM | Admit: 2021-01-25 | Discharge: 2021-01-25 | Disposition: A | Discharge status: Home / Self Care | Payer: 59 | Attending: Emergency Medicine | Admitting: Emergency Medicine

## 2021-01-25 ENCOUNTER — Encounter (HOSPITAL_COMMUNITY): Payer: Self-pay

## 2021-01-25 DIAGNOSIS — Z3A00 Weeks of gestation of pregnancy not specified: Secondary | ICD-10-CM | POA: Insufficient documentation

## 2021-01-25 DIAGNOSIS — Z79899 Other long term (current) drug therapy: Secondary | ICD-10-CM | POA: Insufficient documentation

## 2021-01-25 DIAGNOSIS — I10 Essential (primary) hypertension: Secondary | ICD-10-CM | POA: Insufficient documentation

## 2021-01-25 DIAGNOSIS — R1084 Generalized abdominal pain: Secondary | ICD-10-CM | POA: Insufficient documentation

## 2021-01-25 DIAGNOSIS — I2510 Atherosclerotic heart disease of native coronary artery without angina pectoris: Secondary | ICD-10-CM | POA: Insufficient documentation

## 2021-01-25 DIAGNOSIS — O2230 Deep phlebothrombosis in pregnancy, unspecified trimester: Secondary | ICD-10-CM

## 2021-01-25 DIAGNOSIS — Z7901 Long term (current) use of anticoagulants: Secondary | ICD-10-CM | POA: Insufficient documentation

## 2021-01-25 DIAGNOSIS — I82492 Acute embolism and thrombosis of other specified deep vein of left lower extremity: Secondary | ICD-10-CM | POA: Diagnosis not present

## 2021-01-25 DIAGNOSIS — Z951 Presence of aortocoronary bypass graft: Secondary | ICD-10-CM | POA: Diagnosis not present

## 2021-01-25 DIAGNOSIS — Z87891 Personal history of nicotine dependence: Secondary | ICD-10-CM | POA: Diagnosis not present

## 2021-01-25 LAB — COMPREHENSIVE METABOLIC PANEL
ALT: 16 U/L (ref 0–44)
AST: 20 U/L (ref 15–41)
Albumin: 3.9 g/dL (ref 3.5–5.0)
Alkaline Phosphatase: 88 U/L (ref 38–126)
Anion gap: 5 (ref 5–15)
BUN: 13 mg/dL (ref 6–20)
CO2: 26 mmol/L (ref 22–32)
Calcium: 8.9 mg/dL (ref 8.9–10.3)
Chloride: 104 mmol/L (ref 98–111)
Creatinine, Ser: 0.82 mg/dL (ref 0.44–1.00)
GFR, Estimated: >60 mL/min (ref >60)
Glucose, Bld: 99 mg/dL (ref 70–99)
Potassium: 3.8 mmol/L (ref 3.5–5.1)
Sodium: 135 mmol/L (ref 135–145)
Total Bilirubin: 0.5 mg/dL (ref 0.3–1.2)
Total Protein: 8.1 g/dL (ref 6.5–8.1)

## 2021-01-25 LAB — CBC WITH DIFFERENTIAL/PLATELET
Abs Immature Granulocytes: 0.01 10*3/uL (ref 0.00–0.07)
Basophils Absolute: 0 10*3/uL (ref 0.0–0.1)
Basophils Relative: 0 %
Eosinophils Absolute: 0.1 10*3/uL (ref 0.0–0.5)
Eosinophils Relative: 2 %
HCT: 38 % (ref 36.0–46.0)
Hemoglobin: 12 g/dL (ref 12.0–15.0)
Immature Granulocytes: 0 %
Lymphocytes Relative: 22 %
Lymphs Abs: 1 10*3/uL (ref 0.7–4.0)
MCH: 28.6 pg (ref 26.0–34.0)
MCHC: 31.6 g/dL (ref 30.0–36.0)
MCV: 90.7 fL (ref 80.0–100.0)
Monocytes Absolute: 0.3 10*3/uL (ref 0.1–1.0)
Monocytes Relative: 7 %
Neutro Abs: 3.3 10*3/uL (ref 1.7–7.7)
Neutrophils Relative %: 69 %
Platelets: 265 10*3/uL (ref 150–400)
RBC: 4.19 MIL/uL (ref 3.87–5.11)
RDW: 14 % (ref 11.5–15.5)
WBC: 4.7 10*3/uL (ref 4.0–10.5)
nRBC: 0 % (ref 0.0–0.2)

## 2021-01-25 LAB — LIPASE, BLOOD: Lipase: 34 U/L (ref 11–51)

## 2021-01-25 MED ORDER — APIXABAN 5 MG PO TABS: ORAL_TABLET | ORAL | 0 refills | Status: DC

## 2021-01-25 MED ORDER — DICYCLOMINE HCL 20 MG PO TABS: ORAL_TABLET | ORAL | 0 refills | Status: DC

## 2021-01-25 MED ORDER — SODIUM CHLORIDE 0.9 % IV BOLUS
1000.0000 mL | Freq: Once | INTRAVENOUS | Status: AC
  Administered 2021-01-25: 1000 mL via INTRAVENOUS

## 2021-01-25 MED ORDER — APIXABAN 5 MG PO TABS
10.0000 mg | ORAL_TABLET | Freq: Once | ORAL | Status: AC
  Administered 2021-01-25: 10 mg via ORAL
  Filled 2021-01-25: qty 2

## 2021-01-25 MED ORDER — DICYCLOMINE HCL 20 MG PO TABS: 20.0000 mg | ORAL_TABLET | Freq: Two times a day (BID) | ORAL | 0 refills | Status: DC

## 2021-01-25 MED ORDER — IOHEXOL 300 MG/ML  SOLN
100.0000 mL | Freq: Once | INTRAMUSCULAR | Status: AC | PRN
  Administered 2021-01-25: 100 mL via INTRAVENOUS

## 2021-01-25 MED ORDER — DICYCLOMINE HCL 10 MG PO CAPS
10.0000 mg | ORAL_CAPSULE | Freq: Once | ORAL | Status: AC
  Administered 2021-01-25: 10 mg via ORAL
  Filled 2021-01-25: qty 1

## 2021-01-25 MED ORDER — ONDANSETRON HCL 4 MG/2ML IJ SOLN
4.0000 mg | Freq: Once | INTRAMUSCULAR | Status: AC
  Administered 2021-01-25: 4 mg via INTRAVENOUS
  Filled 2021-01-25: qty 2

## 2021-01-25 MED ORDER — HYDROMORPHONE HCL 1 MG/ML IJ SOLN
0.5000 mg | Freq: Once | INTRAMUSCULAR | Status: AC
  Administered 2021-01-25: 0.5 mg via INTRAVENOUS
  Filled 2021-01-25: qty 1

## 2021-01-25 NOTE — ED Triage Notes (Signed)
Pt to er, pt c/o a rash to her L wrist, states that it started last Tuesday when she got stung by a wasp, states that she has taken oral and topical benadryl without relief.  States that it is very itchy, states that it had some swelling that has gotten better and then worse again.  Pt has area of redness on her L forearm/wrist.  Pt states that she is also here for L knee pain, states that she has pain and swelling in the back of her L knee, states that she doesn't recall any injury or trauma.  States that she has tried ice and topical creams without relief.  Pt states that this started since Saturday.    Pt states that she is also here for abd pain, states that she has been nauseated without vomiting, reports some diarrhea since Sunday.

## 2021-01-25 NOTE — Discharge Instructions (Signed)
Stop taking your aspirin while you are taking the blood thinner.  Follow-up with your family doctor in the next week for recheck and also to schedule your CAT scan of your chest.

## 2021-01-25 NOTE — ED Provider Notes (Signed)
[facility] EMERGENCY DEPARTMENT Provider Note   CSN: [id number] Arrival date & time: 01/25/21  1550     History Chief Complaint  Patient presents with   Abdominal Pain    Carolyn Gaines is a 55 y.o. female.  Patient complains of abdominal pain and swelling to left leg.  No fever no vomiting  The history is provided by the patient and medical records. No language interpreter was used.  Abdominal Pain Pain location:  Generalized Pain quality: aching   Pain radiates to:  Does not radiate Pain severity:  Mild Onset quality:  Sudden Timing:  Constant Progression:  Waxing and waning Chronicity:  New Context: not alcohol use   Associated symptoms: no chest pain, no cough, no diarrhea, no fatigue and no hematuria       Past Medical History:  Diagnosis Date   Anemia    low iron   Anxiety    Arthritis    Coronary artery disease    a. s/p prior stenting to RCA b. CABG in 09/2016 with LIMA-LAD, SVG-OM and SVG-RCA c. 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   Unstable angina (HCC)    Fibromyalgia 10/20/2016   Essential hypertension 07/18/2016   Psoriasis 07/18/2016   Precordial chest pain 07/03/2016   Depression with anxiety 07/03/2016   Normocytic anemia 07/03/2016   History of acute inferior wall MI 02/19/2016   Coronary artery disease involving native heart without angina pectoris 02/19/2016    Past Surgical History:  Procedure Laterality Date   ABDOMINAL HYSTERECTOMY     CARDIAC CATHETERIZATION N/A 02/19/2016   Procedure: Left Heart Cath and Coronary Angiography;  Surgeon: Yates Decamp, MD;  Location: [facility]  INVASIVE CV LAB;  Service: Cardiovascular;  Laterality: N/A;   CARDIAC CATHETERIZATION N/A 02/19/2016   Procedure: Coronary Stent Intervention;  Surgeon: Yates Decamp, MD;  Location: [facility] INVASIVE CV LAB;  Service: Cardiovascular;  Laterality: N/A;   CESAREAN SECTION     x 2   CHOLECYSTECTOMY N/A 01/25/2017   Procedure: LAPAROSCOPIC CHOLECYSTECTOMY;  Surgeon: Franky Macho, MD;  Location: [facility] ORS;  Service: General;  Laterality: N/A;   COLONOSCOPY     CORONARY ARTERY BYPASS GRAFT N/A 10/26/2016   Procedure: CORONARY ARTERY BYPASS GRAFTING (CABG), ON PUMP, TIMES THREE, USING LEFT INTERNAL MAMMARY ARTERY AND ENDOSCOPICALLY HARVESTED RIGHT GREATER SAPHENOUS VEIN;  Surgeon: Alleen Borne, MD;  Location: [facility] OR;  Service: Open Heart Surgery;  Laterality: N/A;  LIMA-LAD SVG-RCA SVG-OM   ESOPHAGOGASTRODUODENOSCOPY     LEFT HEART CATH AND CORONARY ANGIOGRAPHY N/A 10/22/2016   Procedure: Left Heart Cath and Coronary Angiography;  Surgeon: Lennette Bihari, MD;  Location: [facility] INVASIVE CV LAB;  Service: Cardiovascular;  Laterality: N/A;   STERNAL WIRES REMOVAL N/A 07/11/2017   Procedure: STERNAL WIRES REMOVAL;  Surgeon: Alleen Borne, MD;  Location: [facility] OR;  Service: Thoracic;  Laterality: N/A;   TEE WITHOUT CARDIOVERSION N/A 10/26/2016   Procedure: TRANSESOPHAGEAL ECHOCARDIOGRAM (TEE);  Surgeon: Alleen Borne, MD;  Location: [facility] OR;  Service: Open Heart Surgery;  Laterality: N/A;   WRIST FRACTURE SURGERY Left    3 times     OB History     Gravida  2   Para  2   Term      Preterm      AB      Living  2      SAB      IAB      Ectopic      Multiple      Live Births              Family History  Problem Relation Age of Onset   COPD Mother    Arthritis Mother    Heart disease Mother    Stroke Mother    Psoriasis Father    Heart disease Father    Diabetes Maternal Grandmother    Diabetes Maternal Aunt    Diabetes Maternal Aunt     Social History   Tobacco Use   Smoking status:  Former    Pack years: 0.00    Types: Cigarettes    Quit date: 03/21/2011    Years since quitting: 9.8   Smokeless tobacco: Never  Vaping Use   Vaping Use: Never used  Substance Use Topics   Alcohol use: Yes    Comment: rare   Drug use: No    Home Medications Prior to Admission medications   Medication Sig Start Date End Date Taking? Authorizing Provider  acetaminophen (TYLENOL) 500 MG tablet Take 1,000 mg by mouth every 6 (six) hours as needed for mild pain.   Yes [provider]  apixaban (ELIQUIS) 5 MG TABS tablet Take 2 tablets (10mg ) twice daily for 7 days, then 1 tablet (5mg ) twice daily 01/25/21  Yes Bethann BerkshireZammit, Candy Ziegler, MD  carvedilol (COREG) 3.125 MG tablet Take 1 tablet (3.125 mg total) by mouth 2 (two) times daily. 01/05/20  Yes Branch, Dorothe PeaJonathan F, MD  dicyclomine (BENTYL) 20 MG tablet Take 1 tablet (20 mg total) by mouth 2 (two) times daily. 01/25/21  Yes Bethann BerkshireZammit, Gustie Bobb, MD  dicyclomine (BENTYL) 20 MG tablet Take 1 pill every 12 hours as needed for abdominal cramps 01/25/21  Yes Bethann BerkshireZammit, Dyllin Gulley, MD  diphenhydrAMINE (BENADRYL) 2 % cream Apply 1 application topically 3 (three) times daily as needed for itching.   Yes [provider]  DULoxetine (CYMBALTA) 60 MG capsule Take 60 mg by mouth daily.   Yes [provider]  ezetimibe (ZETIA) 10 MG tablet Take 1 tablet (10 mg total) by mouth daily. 11/11/20  Yes Johnson, Clanford L, MD  nitroGLYCERIN (NITROSTAT) 0.4 MG SL tablet PLACE 1 TABLET UNDER THE TONGUE EVERY 5 MINUTES FOR 3 DOSES AS NEEDED FOR CHEST PAIN 01/05/20  Yes Branch, Dorothe PeaJonathan F, MD  Olopatadine HCl (PATADAY OP) Apply 1 drop to eye daily as needed (dry eyes).   Yes [provider]  Polyethyl Glycol-Propyl Glycol (SYSTANE) 0.4-0.3 % SOLN Apply 1 drop to eye daily as needed (dry eyes).   Yes [provider]  rosuvastatin (CRESTOR) 40 MG tablet Take 1 tablet (40 mg total) by mouth every evening. 01/05/20  Yes Branch, Dorothe PeaJonathan F, MD   methocarbamol (ROBAXIN) 500 MG tablet Take 1-2 tablets by mouth 3 (three) times daily as needed. Patient not taking: Reported on 01/25/2021 10/10/20   [provider]  pantoprazole (PROTONIX) 40 MG tablet Take 40 mg by mouth daily. Patient not taking: Reported on 01/25/2021    [provider]  traMADol (ULTRAM) 50 MG tablet Take 50 mg  by mouth as needed. Patient not taking: Reported on 01/25/2021    [provider]  valACYclovir (VALTREX) 1000 MG tablet Take 1,000 mg by mouth 2 (two) times daily. Patient not taking: Reported on 01/25/2021 12/23/20   [provider]    Allergies    Bee venom, Elastic bandages & [zinc], Mango flavor, Adhesive [tape], and Triple antibiotic [bacitracin-neomycin-polymyxin]  Review of Systems   Review of Systems  Constitutional:  Negative for appetite change and fatigue.  HENT:  Negative for congestion, ear discharge and sinus pressure.   Eyes:  Negative for discharge.  Respiratory:  Negative for cough.   Cardiovascular:  Negative for chest pain.  Gastrointestinal:  Positive for abdominal pain. Negative for diarrhea.  Genitourinary:  Negative for frequency and hematuria.  Musculoskeletal:  Negative for back pain.       Swelling left lower leg  Skin:  Negative for rash.  Neurological:  Negative for seizures and headaches.  Psychiatric/Behavioral:  Negative for hallucinations.    Physical Exam Updated Vital Signs BP 105/64   Pulse 79   Temp 98.7 F (37.1 C) (Oral)   Resp 15   Ht 5\' 3"  (1.6 m)   Wt 79.4 kg   SpO2 96%   BMI 31.00 kg/m   Physical Exam Vitals and nursing note reviewed.  Constitutional:      Appearance: She is well-developed.  HENT:     Head: Normocephalic.     Nose: Nose normal.  Eyes:     General: No scleral icterus.    Conjunctiva/sclera: Conjunctivae normal.  Neck:     Thyroid: No thyromegaly.  Cardiovascular:     Rate and Rhythm: Normal rate and regular rhythm.     Heart sounds: No murmur  heard.   No friction rub. No gallop.  Pulmonary:     Breath sounds: No stridor. No wheezing or rales.  Chest:     Chest wall: No tenderness.  Abdominal:     General: There is no distension.     Tenderness: There is no abdominal tenderness. There is no rebound.  Musculoskeletal:        General: Normal range of motion.     Cervical back: Neck supple.     Comments: Tender left calf  Lymphadenopathy:     Cervical: No cervical adenopathy.  Skin:    Findings: No erythema or rash.  Neurological:     Mental Status: She is alert and oriented to person, place, and time.     Motor: No abnormal muscle tone.     Coordination: Coordination normal.  Psychiatric:        Behavior: Behavior normal.    ED Results / Procedures / Treatments   Labs (all labs ordered are listed, but only abnormal results are displayed) Labs Reviewed  CBC WITH DIFFERENTIAL/PLATELET  COMPREHENSIVE METABOLIC PANEL  LIPASE, BLOOD    EKG None  Radiology CT ABDOMEN PELVIS W CONTRAST  Result Date: 01/25/2021 CLINICAL DATA:  Abdominal pain with nausea and diarrhea. EXAM: CT ABDOMEN AND PELVIS WITH CONTRAST TECHNIQUE: Multidetector CT imaging of the abdomen and pelvis was performed using the standard protocol following bolus administration of intravenous contrast. CONTRAST:  01/27/2021 OMNIPAQUE IOHEXOL 300 MG/ML  SOLN COMPARISON:  Dec 22, 2016 FINDINGS: Lower chest: A 6 mm noncalcified lung nodule is seen within the lateral aspect of the right lower lobe (axial CT image 3, CT series 6). A 5 mm noncalcified lung nodule is seen within the left lung base (axial CT image 8, CT  series 6). These represent new findings when compared to the prior study. Hepatobiliary: No focal liver abnormality is seen. Status post cholecystectomy. No biliary dilatation. Pancreas: Unremarkable. No pancreatic ductal dilatation or surrounding inflammatory changes. Spleen: Normal in size without focal abnormality. Adrenals/Urinary Tract: Adrenal glands are  unremarkable. Kidneys are normal, without renal calculi, focal lesion, or hydronephrosis. Bladder is unremarkable. Stomach/Bowel: Stomach is within normal limits. Appendix appears normal. No evidence of bowel wall thickening, distention, or inflammatory changes. Vascular/Lymphatic: Aortic atherosclerosis. No enlarged abdominal or pelvic lymph nodes. Reproductive: Status post hysterectomy. No adnexal masses. Other: No abdominal wall hernia or abnormality. No abdominopelvic ascites. Musculoskeletal: No acute or significant osseous findings. IMPRESSION: 1. Subcentimeter bilateral lower lobe noncalcified lung nodules. Further evaluation with chest CT is recommended to determine the presence or absence of additional noncalcified lung nodules. 2. Evidence of prior cholecystectomy and prior hysterectomy. 3. No acute or active process within the abdomen or pelvis. Electronically Signed   By: Aram Candela M.D.   On: 01/25/2021 18:21   US Venous Img Lower Unilateral Left  Result Date: 01/25/2021 CLINICAL DATA:  Left leg pain. EXAM: LEFT LOWER EXTREMITY VENOUS DOPPLER ULTRASOUND TECHNIQUE: Gray-scale sonography with compression, as well as color and duplex ultrasound, were performed to evaluate the deep venous system(s) from the level of the common femoral vein through the popliteal and proximal calf veins. COMPARISON:  None. FINDINGS: VENOUS Normal compressibility of the LEFT common femoral and superficial femoral veins, as well as the visualized LEFT calf veins. Visualized portions of the LEFT profunda femoral vein and LEFT great saphenous vein are unremarkable. Abnormal compressibility of the LEFT popliteal vein is noted. An associated filling defect suggestive of DVT is seen within the LEFT popliteal vein on grayscale and color Doppler imaging. Doppler waveforms also show abnormal direction of venous flow, abnormal respiratory plasticity and abnormal response to augmentation. Limited views of the contralateral  common femoral vein are unremarkable. OTHER None. Limitations: none IMPRESSION: Nonocclusive thrombus within the LEFT popliteal vein. Electronically Signed   By: Aram Candela M.D.   On: 01/25/2021 17:14    Procedures Procedures   Medications Ordered in ED Medications  dicyclomine (BENTYL) capsule 10 mg (has no administration in time range)  apixaban (ELIQUIS) tablet 10 mg (has no administration in time range)  sodium chloride 0.9 % bolus 1,000 mL (0 mLs Intravenous Stopped 01/25/21 1917)  HYDROmorphone (DILAUDID) injection 0.5 mg (0.5 mg Intravenous Given 01/25/21 1654)  ondansetron (ZOFRAN) injection 4 mg (4 mg Intravenous Given 01/25/21 1654)  iohexol (OMNIPAQUE) 300 MG/ML solution 100 mL (100 mLs Intravenous Contrast Given 01/25/21 1734)    ED Course  I have reviewed the triage vital signs and the nursing notes.  Pertinent labs & imaging results that were available during my care of the patient were reviewed by me and considered in my medical decision making (see chart for details).    MDM Rules/Calculators/A&P                          Patient with a DVT she will be starting Eliquis.  CT of abdomen is unremarkable except for calcification of the chest and she will get a CT of the chest as an outpatient.  Patient also put on Bentyl for abdominal cramping and will follow up with PCP Final Clinical Impression(s) / ED Diagnoses Final diagnoses:  DVT (deep vein thrombosis) in pregnancy    Rx / DC Orders ED Discharge Orders  Ordered    dicyclomine (BENTYL) 20 MG tablet  2 times daily        01/25/21 2005    apixaban (ELIQUIS) 5 MG TABS tablet        01/25/21 2009    dicyclomine (BENTYL) 20 MG tablet        01/25/21 2009             Bethann Berkshire, MD 01/27/21 1145

## 2021-02-08 NOTE — Progress Notes (Signed)
Cardiology Office Note    Date:  02/13/2021   ID:  Carolyn Gaines, DOB 1965/09/15, MRN [id number]   PCP:  Benita Stabile, MD   [facility]  Cardiologist:  Dina Rich, MD  Advanced Practice Provider:  No care team member to display Electrophysiologist:  None   [id number]   Chief Complaint  Patient presents with   Follow-up     History of Present Illness:  Carolyn Gaines is a 55 y.o. female with history of CAD status post CABG x3 in 2018, hypertension, GERD, anxiety depression  Patient hospitalized 10/2020 with atypical chest pain reproducible on palpation felt to be musculoskeletal.  She had a low risk Myoview 11/10/2020.  She was started on Zetia.   Patient comes in for f/u. Feels great. Had blood work yesterday and told her everything was ok. Diagnosed with DVT 01/25/21 and started on Eliquis. Ct incidental finding lung nodules which were new-she's supposed to be scheduled for CT chest.  Past Medical History:  Diagnosis Date   Anemia    low iron   Anxiety    Arthritis    Coronary artery disease    a. s/p prior stenting to RCA b. CABG in 09/2016 with LIMA-LAD, SVG-OM and SVG-RCA c. low-risk NST in 01/2019   Depression    Fibromyalgia    Gallstones    GERD (gastroesophageal reflux disease)    Headache    migraines   MI (myocardial infarction) (HCC) 01/2016   Peptic ulcer    Seizures (HCC)    as a baby    Past Surgical History:  Procedure Laterality Date   ABDOMINAL HYSTERECTOMY     CARDIAC CATHETERIZATION N/A 02/19/2016   Procedure: Left Heart Cath and Coronary Angiography;  Surgeon: Yates Decamp, MD;  Location: [facility] INVASIVE CV LAB;  Service: Cardiovascular;  Laterality: N/A;   CARDIAC CATHETERIZATION N/A 02/19/2016   Procedure: Coronary Stent Intervention;  Surgeon: Yates Decamp, MD;  Location: [facility] INVASIVE CV LAB;  Service: Cardiovascular;  Laterality: N/A;   CESAREAN SECTION     x 2   CHOLECYSTECTOMY N/A 01/25/2017   Procedure: LAPAROSCOPIC  CHOLECYSTECTOMY;  Surgeon: Franky Macho, MD;  Location: [facility] ORS;  Service: General;  Laterality: N/A;   COLONOSCOPY     CORONARY ARTERY BYPASS GRAFT N/A 10/26/2016   Procedure: CORONARY ARTERY BYPASS GRAFTING (CABG), ON PUMP, TIMES THREE, USING LEFT INTERNAL MAMMARY ARTERY AND ENDOSCOPICALLY HARVESTED RIGHT GREATER SAPHENOUS VEIN;  Surgeon: Alleen Borne, MD;  Location: [facility] OR;  Service: Open Heart Surgery;  Laterality: N/A;  LIMA-LAD SVG-RCA SVG-OM   ESOPHAGOGASTRODUODENOSCOPY     LEFT HEART CATH AND CORONARY ANGIOGRAPHY N/A 10/22/2016   Procedure: Left Heart Cath and Coronary Angiography;  Surgeon: Lennette Bihari, MD;  Location: [facility] INVASIVE CV LAB;  Service: Cardiovascular;  Laterality: N/A;   STERNAL WIRES REMOVAL N/A 07/11/2017   Procedure: STERNAL WIRES REMOVAL;  Surgeon: Alleen Borne, MD;  Location: [facility] OR;  Service: Thoracic;  Laterality: N/A;   TEE WITHOUT CARDIOVERSION N/A 10/26/2016   Procedure: TRANSESOPHAGEAL ECHOCARDIOGRAM (TEE);  Surgeon: Alleen Borne, MD;  Location: [facility] OR;  Service: Open Heart Surgery;  Laterality: N/A;   WRIST FRACTURE SURGERY Left    3 times    Current Medications: Current Meds  Medication Sig   acetaminophen (TYLENOL) 500 MG tablet Take 1,000 mg by mouth every 6 (six) hours as needed for mild pain.   diphenhydrAMINE (BENADRYL) 2 % cream Apply 1 application topically 3 (three)  times daily as needed for itching.   DULoxetine (CYMBALTA) 60 MG capsule Take 60 mg by mouth daily.   Olopatadine HCl (PATADAY OP) Apply 1 drop to eye daily as needed (dry eyes).   pantoprazole (PROTONIX) 40 MG tablet Take 40 mg by mouth daily.   Polyethyl Glycol-Propyl Glycol (SYSTANE) 0.4-0.3 % SOLN Apply 1 drop to eye daily as needed (dry eyes).   traMADol (ULTRAM) 50 MG tablet Take 50 mg by mouth as needed.   [DISCONTINUED] apixaban (ELIQUIS) 5 MG TABS tablet Take 2 tablets (10mg ) twice daily for 7 days, then 1 tablet (5mg ) twice daily   [DISCONTINUED] carvedilol (COREG) 3.125  MG tablet Take 1 tablet (3.125 mg total) by mouth 2 (two) times daily.   [DISCONTINUED] ezetimibe (ZETIA) 10 MG tablet Take 1 tablet (10 mg total) by mouth daily.   [DISCONTINUED] nitroGLYCERIN (NITROSTAT) 0.4 MG SL tablet PLACE 1 TABLET UNDER THE TONGUE EVERY 5 MINUTES FOR 3 DOSES AS NEEDED FOR CHEST PAIN   [DISCONTINUED] rosuvastatin (CRESTOR) 40 MG tablet Take 1 tablet (40 mg total) by mouth every evening.     Allergies:   Bee venom, Elastic bandages & [zinc], Mango flavor, Adhesive [tape], and Triple antibiotic [bacitracin-neomycin-polymyxin]   Social History   Socioeconomic History   Marital status: Widowed    Spouse name: Not on file   Number of children: 2   Years of education: Not on file   Highest education level: Not on file  Occupational History   Occupation:  Tobacco Use   Smoking status: Former    Types: Cigarettes    Quit date: 03/21/2011    Years since quitting: 9.9   Smokeless tobacco: Never  Vaping Use   Vaping Use: Never used  Substance and Sexual Activity   Alcohol use: Yes    Comment: rare   Drug use: No   Sexual activity: Yes    Birth control/protection: Surgical    Comment: hyst  Other Topics Concern   Not on file  Social History Narrative   Not on file   Social Determinants of Health   Financial Resource Strain: Not on file  Food Insecurity: Not on file  Transportation Needs: Not on file  Physical Activity: Not on file  Stress: Not on file  Social Connections: Not on file     Family History:  [name]  family history includes Arthritis in her mother; COPD in her mother; Diabetes in her maternal aunt, maternal aunt, and maternal grandmother; Heart disease in her father and mother; Psoriasis in her father; Stroke in her mother.   ROS:   Please see the history of present illness.    ROS All other systems reviewed and are negative.   PHYSICAL EXAM:   VS:  BP 108/66   Pulse 72   Ht 5\' 3"  (1.6 m)   Wt 174 lb (78.9  kg)   SpO2 97%   BMI 30.82 kg/m   Physical Exam  GEN: Well nourished, well developed, in no acute distress  Neck: no JVD, carotid bruits, or masses Cardiac:RRR; no murmurs, rubs, or gallops  Respiratory:  clear to auscultation bilaterally, normal work of breathing GI: soft, nontender, nondistended, + BS Ext: without cyanosis, clubbing, or edema, Good distal pulses bilaterally Neuro:  Alert and Oriented x 3, Psych: euthymic mood, full affect  Wt Readings from Last 3 Encounters:  02/13/21 174 lb (78.9 kg)  01/25/21 175 lb (79.4 kg)  11/09/20 175 lb 14.8 oz (79.8 kg)  Studies/Labs Reviewed:   EKG:  EKG is not ordered today.     Recent Labs: 01/25/2021: ALT 16; BUN 13; Creatinine, Ser 0.82; Hemoglobin 12.0; Platelets 265; Potassium 3.8; Sodium 135   Lipid Panel    Component Value Date/Time   CHOL 177 11/10/2020 0404   TRIG 113 11/10/2020 0404   HDL 37 (L) 11/10/2020 0404   CHOLHDL 4.8 11/10/2020 0404   VLDL 23 11/10/2020 0404   LDLCALC 117 (H) 11/10/2020 0404    Additional studies/ records that were reviewed today include:  11/10/20 Lexiscan stress is electrically negative Myovue scan shows normal perfusion No significant ischemia or scar LVEF calculated at 60% Overall low risk study.     Risk Assessment/Calculations:         ASSESSMENT:    1. Coronary artery disease involving native coronary artery of native heart without angina pectoris   2. Essential hypertension   3. Mixed hyperlipidemia   4. Acute deep vein thrombosis (DVT) of left lower extremity, unspecified vein (HCC)      PLAN:  In order of problems listed above:  CAD status post stenting of the RCA, CABG 09/2016 with hospitalization for chest pain 10/2020. Chest pain most likely musculoskeletal with normal Lexiscan Myoview 11/10/2020-no further chest pain    Hypertension BP well controlled  HLD Zetia added to Crestor-get labs from PCP  DVT 01/25/21 started on eliquis-needs refills.  Lung  nodules incidental finding on abdominal CT-to be scheduled for CT by PCP  Shared Decision Making/Informed Consent        Medication Adjustments/Labs and Tests Ordered: Current medicines are reviewed at length with the patient today.  Concerns regarding medicines are outlined above.  Medication changes, Labs and Tests ordered today are listed in the Patient Instructions below. Patient Instructions  Medication Instructions:  Your physician recommends that you continue on your current medications as directed. Please refer to the Current Medication list given to you today.  *If you need a refill on your cardiac medications before your next appointment, please call your pharmacy*   Lab Work: None today  If you have labs (blood work) drawn today and your tests are completely normal, you will receive your results only by: MyChart Message (if you have MyChart) OR A paper copy in the mail If you have any lab test that is abnormal or we need to change your treatment, we will call you to review the results.   Testing/Procedures: None today    Follow-Up: At [facility], you and your health needs are our priority.  As part of our continuing mission to provide you with exceptional heart care, we have created designated Provider Care Teams.  These Care Teams include your primary Cardiologist (physician) and Advanced Practice Providers (APPs -  Physician Assistants and Nurse Practitioners) who all work together to provide you with the care you need, when you need it.  We recommend signing up for the patient portal called "MyChart".  Sign up information is provided on this After Visit Summary.  MyChart is used to connect with patients for Virtual Visits (Telemedicine).  Patients are able to view lab/test results, encounter notes, upcoming appointments, etc.  Non-urgent messages can be sent to your provider as well.   To learn more about what you can do with MyChart, go to [email or web address].     Your next appointment:   6 month(s)  The format for your next appointment:   In Person  Provider:   Dina Rich, MD   Other Instructions None  Signed, [name], PA-C  02/13/2021 1:25 PM    [facility] [address] [place], [place], [place]  [address] Phone: [phone]; Fax: [phone]

## 2021-02-13 ENCOUNTER — Other Ambulatory Visit: Payer: Self-pay

## 2021-02-13 ENCOUNTER — Ambulatory Visit: Payer: 59 | Admitting: Physician Assistant

## 2021-02-13 ENCOUNTER — Encounter: Payer: Self-pay | Admitting: Physician Assistant

## 2021-02-13 VITALS — BP 108/66 | HR 72 | Ht 63.0 in | Wt 174.0 lb

## 2021-02-13 DIAGNOSIS — I2510 Atherosclerotic heart disease of native coronary artery without angina pectoris: Secondary | ICD-10-CM | POA: Diagnosis not present

## 2021-02-13 DIAGNOSIS — I82402 Acute embolism and thrombosis of unspecified deep veins of left lower extremity: Secondary | ICD-10-CM

## 2021-02-13 DIAGNOSIS — E782 Mixed hyperlipidemia: Secondary | ICD-10-CM

## 2021-02-13 DIAGNOSIS — I10 Essential (primary) hypertension: Secondary | ICD-10-CM

## 2021-02-13 MED ORDER — CARVEDILOL 3.125 MG PO TABS
3.1250 mg | ORAL_TABLET | Freq: Two times a day (BID) | ORAL | 3 refills | Status: DC
Start: 2021-02-13 — End: 2024-01-17

## 2021-02-13 MED ORDER — APIXABAN 5 MG PO TABS: ORAL_TABLET | ORAL | 6 refills | Status: DC

## 2021-02-13 MED ORDER — ROSUVASTATIN CALCIUM 40 MG PO TABS
40.0000 mg | ORAL_TABLET | Freq: Every evening | ORAL | 3 refills | Status: DC
Start: 2021-02-13 — End: 2023-07-15

## 2021-02-13 MED ORDER — EZETIMIBE 10 MG PO TABS: 10.0000 mg | ORAL_TABLET | Freq: Every day | ORAL | 3 refills | Status: DC

## 2021-02-13 MED ORDER — NITROGLYCERIN 0.4 MG SL SUBL: SUBLINGUAL_TABLET | SUBLINGUAL | 3 refills | Status: DC

## 2021-02-13 NOTE — Patient Instructions (Signed)
Medication Instructions:  Your physician recommends that you continue on your current medications as directed. Please refer to the Current Medication list given to you today.  *If you need a refill on your cardiac medications before your next appointment, please call your pharmacy*   Lab Work: None today If you have labs (blood work) drawn today and your tests are completely normal, you will receive your results only by: MyChart Message (if you have MyChart) OR A paper copy in the mail If you have any lab test that is abnormal or we need to change your treatment, we will call you to review the results.   Testing/Procedures: None today   Follow-Up: At [facility], you and your health needs are our priority.  As part of our continuing mission to provide you with exceptional heart care, we have created designated Provider Care Teams.  These Care Teams include your primary Cardiologist (physician) and Advanced Practice Providers (APPs -  Physician Assistants and Nurse Practitioners) who all work together to provide you with the care you need, when you need it.  We recommend signing up for the patient portal called "MyChart".  Sign up information is provided on this After Visit Summary.  MyChart is used to connect with patients for Virtual Visits (Telemedicine).  Patients are able to view lab/test results, encounter notes, upcoming appointments, etc.  Non-urgent messages can be sent to your provider as well.   To learn more about what you can do with MyChart, go to [email or web address].com.    Your next appointment:   6 month(s)  The format for your next appointment:   In Person  Provider:   Jonathan Branch, MD   Other Instructions None     

## 2021-05-17 ENCOUNTER — Telehealth: Payer: Self-pay | Admitting: *Deleted

## 2021-05-17 MED ORDER — APIXABAN 5 MG PO TABS: ORAL_TABLET | ORAL | 6 refills | Status: DC

## 2021-05-17 NOTE — Telephone Encounter (Signed)
Prescription refill request for Eliquis received. Indication: DVT Last office visit: 02/13/21  Leda Gauze PA-C Scr: 0.98 on 02/10/21 Age: 55 Weight: 78.9kg  Based on above findings Eliquis 5mg  twice daily is the appropriate dose.  Refill approved.

## 2021-08-30 ENCOUNTER — Other Ambulatory Visit: Payer: Self-pay | Admitting: *Deleted

## 2021-08-30 MED ORDER — APIXABAN 5 MG PO TABS: ORAL_TABLET | ORAL | 3 refills | Status: DC

## 2021-08-30 NOTE — Telephone Encounter (Signed)
Prescription refill request for Eliquis received. Indication: DVT Last office visit: 02/13/21  Leda Gauze PA-C Scr: 0.98 on 02/10/21 Age: 56 Weight: 78.9  Based on above findings Eliquis 5mg  twice daily is the appropriate dose.  Refill approved.  Pt is past due for appt with MD.  Message sent to [facility] to make appt.  Requested BMP/CBC be done at that time.

## 2021-10-25 NOTE — Progress Notes (Deleted)
? ?Cardiology Office Note   ? ?Date:  10/25/2021  ? ?ID:  AMEENA Gaines, DOB 06-09-1966, MRN [id number] ? ? ?PCP:  Benita Stabile, MD ?  ?[facility]  ?Cardiologist:  Dina Rich, MD *** ?Advanced Practice Provider:  No care team member to display ?Electrophysiologist:  None  ? ?[id number]}  ? ?No chief complaint on file. ? ? ?History of Present Illness:  ?Carolyn Gaines is a 56 y.o. female with history of CAD status post CABG x3 in 2018, hypertension, GERD, anxiety depression ?  ?Patient hospitalized 10/2020 with atypical chest pain reproducible on palpation felt to be musculoskeletal.  She had a low risk Myoview 11/10/2020.  She was started on Zetia.  ? ? Diagnosed with DVT 01/25/21 and started on Eliquis. Ct incidental finding lung nodules which were new-she's supposed to be scheduled for CT chest.  ? ? ? ?Past Medical History:  ?Diagnosis Date  ? Anemia   ? low iron  ? Anxiety   ? Arthritis   ? Coronary artery disease   ? a. s/p prior stenting to RCA b. CABG in 09/2016 with LIMA-LAD, SVG-OM and SVG-RCA c. low-risk NST in 01/2019  ? Depression   ? Fibromyalgia   ? Gallstones   ? GERD (gastroesophageal reflux disease)   ? Headache   ? migraines  ? MI (myocardial infarction) (HCC) 01/2016  ? Peptic ulcer   ? Seizures (HCC)   ? as a baby  ? ? ?Past Surgical History:  ?Procedure Laterality Date  ? ABDOMINAL HYSTERECTOMY    ? CARDIAC CATHETERIZATION N/A 02/19/2016  ? Procedure: Left Heart Cath and Coronary Angiography;  Surgeon: Yates Decamp, MD;  Location: [facility] INVASIVE CV LAB;  Service: Cardiovascular;  Laterality: N/A;  ? CARDIAC CATHETERIZATION N/A 02/19/2016  ? Procedure: Coronary Stent Intervention;  Surgeon: Yates Decamp, MD;  Location: [facility] INVASIVE CV LAB;  Service: Cardiovascular;  Laterality: N/A;  ? CESAREAN SECTION    ? x 2  ? CHOLECYSTECTOMY N/A 01/25/2017  ? Procedure: LAPAROSCOPIC CHOLECYSTECTOMY;  Surgeon: Franky Macho, MD;  Location: [facility] ORS;  Service: General;  Laterality: N/A;  ?  COLONOSCOPY    ? CORONARY ARTERY BYPASS GRAFT N/A 10/26/2016  ? Procedure: CORONARY ARTERY BYPASS GRAFTING (CABG), ON PUMP, TIMES THREE, USING LEFT INTERNAL MAMMARY ARTERY AND ENDOSCOPICALLY HARVESTED RIGHT GREATER SAPHENOUS VEIN;  Surgeon: Alleen Borne, MD;  Location: [facility] OR;  Service: Open Heart Surgery;  Laterality: N/A;  LIMA-LAD ?SVG-RCA ?SVG-OM  ? ESOPHAGOGASTRODUODENOSCOPY    ? LEFT HEART CATH AND CORONARY ANGIOGRAPHY N/A 10/22/2016  ? Procedure: Left Heart Cath and Coronary Angiography;  Surgeon: Lennette Bihari, MD;  Location: [facility] INVASIVE CV LAB;  Service: Cardiovascular;  Laterality: N/A;  ? STERNAL WIRES REMOVAL N/A 07/11/2017  ? Procedure: STERNAL WIRES REMOVAL;  Surgeon: Alleen Borne, MD;  Location: [facility] OR;  Service: Thoracic;  Laterality: N/A;  ? TEE WITHOUT CARDIOVERSION N/A 10/26/2016  ? Procedure: TRANSESOPHAGEAL ECHOCARDIOGRAM (TEE);  Surgeon: Alleen Borne, MD;  Location: [facility] OR;  Service: Open Heart Surgery;  Laterality: N/A;  ? WRIST FRACTURE SURGERY Left   ? 3 times  ? ? ?Current Medications: ?No outpatient medications have been marked as taking for the 10/31/21 encounter (Appointment) with Dyann Kief, PA-C.  ?  ? ?Allergies:   Bee venom, Elastic bandages & [zinc], Mango flavor, Adhesive [tape], and Triple antibiotic [bacitracin-neomycin-polymyxin]  ? ?Social History  ? ?Socioeconomic History  ? Marital status: Widowed  ?  Spouse name:  Not on file  ? Number of children: 2  ? Years of education: Not on file  ? Highest education level: Not on file  ?Occupational History  ? Occupation: Education officer, community  ?Tobacco Use  ? Smoking status: Former  ?  Types: Cigarettes  ?  Quit date: 03/21/2011  ?  Years since quitting: 10.6  ? Smokeless tobacco: Never  ?Vaping Use  ? Vaping Use: Never used  ?Substance and Sexual Activity  ? Alcohol use: Yes  ?  Comment: rare  ? Drug use: No  ? Sexual activity: Yes  ?  Birth control/protection: Surgical  ?  Comment: hyst  ?Other Topics Concern  ? Not on file   ?Social History Narrative  ? Not on file  ? ?Social Determinants of Health  ? ?Financial Resource Strain: Not on file  ?Food Insecurity: Not on file  ?Transportation Needs: Not on file  ?Physical Activity: Not on file  ?Stress: Not on file  ?Social Connections: Not on file  ?  ? ?Family History:  [name] ***family history includes Arthritis in her mother; COPD in her mother; Diabetes in her maternal aunt, maternal aunt, and maternal grandmother; Heart disease in her father and mother; Psoriasis in her father; Stroke in her mother.  ? ?ROS:   ?Please see the history of present illness.    ?ROS All other systems reviewed and are negative. ? ? ?PHYSICAL EXAM:   ?VS:  There were no vitals taken for this visit.  ?Physical Exam  ?GEN: Well nourished, well developed, in no acute distress  ?HEENT: normal  ?Neck: no JVD, carotid bruits, or masses ?Cardiac:RRR; no murmurs, rubs, or gallops  ?Respiratory:  clear to auscultation bilaterally, normal work of breathing ?GI: soft, nontender, nondistended, + BS ?Ext: without cyanosis, clubbing, or edema, Good distal pulses bilaterally ?MS: no deformity or atrophy  ?Skin: warm and dry, no rash ?Neuro:  Alert and Oriented x 3, Strength and sensation are intact ?Psych: euthymic mood, full affect ? ?Wt Readings from Last 3 Encounters:  ?02/13/21 174 lb (78.9 kg)  ?01/25/21 175 lb (79.4 kg)  ?11/09/20 175 lb 14.8 oz (79.8 kg)  ?  ? ? ?Studies/Labs Reviewed:  ? ?EKG:  EKG is*** ordered today.  The ekg ordered today demonstrates *** ? ?Recent Labs: ?01/25/2021: ALT 16; BUN 13; Creatinine, Ser 0.82; Hemoglobin 12.0; Platelets 265; Potassium 3.8; Sodium 135  ? ?Lipid Panel ?   ?Component Value Date/Time  ? CHOL 177 11/10/2020 0404  ? TRIG 113 11/10/2020 0404  ? HDL 37 (L) 11/10/2020 0404  ? CHOLHDL 4.8 11/10/2020 0404  ? VLDL 23 11/10/2020 0404  ? LDLCALC 117 (H) 11/10/2020 0404  ? ? ?Additional studies/ records that were reviewed today include:  ?11/10/20 ?Lexiscan stress is  electrically negative ?Myovue scan shows normal perfusion No significant ischemia or scar ?LVEF calculated at 60% ?Overall low risk study. ?  ? ? ?Risk Assessment/Calculations:   ?{Does this patient have ATRIAL FIBRILLATION?:[id number]} ? ? ? ? ?ASSESSMENT:   ? ?No diagnosis found. ? ? ?PLAN:  ?In order of problems listed above: ? ?CAD status post stenting of the RCA, CABG 09/2016 with hospitalization for chest pain 10/2020. Chest pain most likely musculoskeletal with normal Lexiscan Myoview 11/10/2020-no further chest pain   ?  ?Hypertension BP well controlled ?  ?HLD Zetia added to Crestor-get labs from PCP ?  ?DVT 01/25/21 started on eliquis-needs refills. ?  ?Lung nodules incidental finding on abdominal CT-to be scheduled for CT by PCP ? ?  Shared Decision Making/Informed Consent   ?{Are you ordering a CV Procedure (e.g. stress test, cath, DCCV, TEE, etc)?   Press F2        :[id number]}  ? ? ?Medication Adjustments/Labs and Tests Ordered: ?Current medicines are reviewed at length with the patient today.  Concerns regarding medicines are outlined above.  Medication changes, Labs and Tests ordered today are listed in the Patient Instructions below. ?There are no Patient Instructions on file for this visit.  ? ?Signed, ?[name], PA-C  ?10/25/2021 9:39 AM    ?[facility] ?[address] [place], [place], [place]  [address] ?Phone: [phone]; Fax: [phone]  ? ? ?

## 2021-10-31 ENCOUNTER — Ambulatory Visit: Payer: 59 | Admitting: Physician Assistant

## 2022-02-27 NOTE — Progress Notes (Signed)
Cardiology Office Note    Date:  03/05/2022   ID:  ALFONSO SHACKETT, DOB 1965-10-12, MRN [id number]   PCP:  Benita Stabile, MD   [facility]  Cardiologist:  Dina Rich, MD   Advanced Practice Provider:  No care team member to display Electrophysiologist:  None   [id number]   Chief Complaint  Patient presents with   Follow-up    History of Present Illness:  Carolyn Gaines is a 56 y.o. female  with history of CAD status post CABG x3 in 2018, hypertension, GERD, anxiety depression   Patient hospitalized 10/2020 with atypical chest pain reproducible on palpation felt to be musculoskeletal.  She had a low risk Myoview 11/10/2020.  She was started on Zetia.   Diagnosed with DVT 01/25/21 and started on Eliquis. Ct incidental finding lung nodules which were new-she's supposed to be scheduled for CT chest.   I saw the patient 02/13/21 and she was doing well.   Patient comes in for f/u. Asking for lanacane patches because of recurrent muscle strain in her chest. 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 It's the only thing that helps her. She is tearful about the strains of her job.Working 50-60 hrs/week.  Worried she has another blood clot in her  other leg. Started on lyrica for depression but can't take it twice a day. Swims in her pool some otherwise no exercise outside of work. Her son and grand daughter live with her. Some ankle swelling after being on her feet all day.  Past Medical History:  Diagnosis Date   Anemia    low iron   Anxiety    Arthritis    Coronary artery disease    a. s/p prior stenting to RCA b. CABG in 09/2016 with LIMA-LAD, SVG-OM and SVG-RCA c. low-risk NST in 01/2019   Depression    Fibromyalgia    Gallstones    GERD (gastroesophageal reflux disease)    Headache    migraines   MI (myocardial infarction) (HCC) 01/2016   Peptic ulcer    Seizures (HCC)    as a baby    Past Surgical History:  Procedure Laterality Date    ABDOMINAL HYSTERECTOMY     CARDIAC CATHETERIZATION N/A 02/19/2016   Procedure: Left Heart Cath and Coronary Angiography;  Surgeon: Yates Decamp, MD;  Location: [facility] INVASIVE CV LAB;  Service: Cardiovascular;  Laterality: N/A;   CARDIAC CATHETERIZATION N/A 02/19/2016   Procedure: Coronary Stent Intervention;  Surgeon: Yates Decamp, MD;  Location: [facility] INVASIVE CV LAB;  Service: Cardiovascular;  Laterality: N/A;   CESAREAN SECTION     x 2   CHOLECYSTECTOMY N/A 01/25/2017   Procedure: LAPAROSCOPIC CHOLECYSTECTOMY;  Surgeon: Franky Macho, MD;  Location: [facility] ORS;  Service: General;  Laterality: N/A;   COLONOSCOPY     CORONARY ARTERY BYPASS GRAFT N/A 10/26/2016   Procedure: CORONARY ARTERY BYPASS GRAFTING (CABG), ON PUMP, TIMES THREE, USING LEFT INTERNAL MAMMARY ARTERY AND ENDOSCOPICALLY HARVESTED RIGHT GREATER SAPHENOUS VEIN;  Surgeon: Alleen Borne, MD;  Location: [facility] OR;  Service: Open Heart Surgery;  Laterality: N/A;  LIMA-LAD SVG-RCA SVG-OM   ESOPHAGOGASTRODUODENOSCOPY     LEFT HEART CATH AND CORONARY ANGIOGRAPHY N/A 10/22/2016   Procedure: Left Heart Cath and Coronary Angiography;  Surgeon: Lennette Bihari, MD;  Location: [facility] INVASIVE CV LAB;  Service: Cardiovascular;  Laterality: N/A;   STERNAL WIRES REMOVAL N/A 07/11/2017   Procedure: STERNAL WIRES REMOVAL;  Surgeon: Alleen Borne, MD;  Location: [facility] OR;  Service: Thoracic;  Laterality: N/A;   TEE WITHOUT CARDIOVERSION N/A 10/26/2016   Procedure: TRANSESOPHAGEAL ECHOCARDIOGRAM (TEE);  Surgeon: Alleen Borne, MD;  Location: [facility] OR;  Service: Open Heart Surgery;  Laterality: N/A;   WRIST FRACTURE SURGERY Left    3 times    Current Medications: Current Meds  Medication Sig   acetaminophen (TYLENOL) 500 MG tablet Take 1,000 mg by mouth every 6 (six) hours as needed for mild pain.   apixaban (ELIQUIS) 5 MG TABS tablet 1 tablet ( ) twice daily   carvedilol (COREG) 3.125 MG tablet Take 1 tablet (3.125 mg total) by mouth 2 (two) times daily.    diphenhydrAMINE (BENADRYL) 2 % cream Apply 1 application topically 3 (three) times daily as needed for itching.   DULoxetine (CYMBALTA) 60 MG capsule Take 60 mg by mouth daily.   ezetimibe (ZETIA) 10 MG tablet Take 1 tablet (10 mg total) by mouth daily.   nitroGLYCERIN (NITROSTAT) 0.4 MG SL tablet PLACE 1 TABLET UNDER THE TONGUE EVERY 5 MINUTES FOR 3 DOSES AS NEEDED FOR CHEST PAIN   Olopatadine HCl (PATADAY OP) Apply 1 drop to eye daily as needed (dry eyes).   pantoprazole (PROTONIX) 40 MG tablet Take 40 mg by mouth daily.   Polyethyl Glycol-Propyl Glycol (SYSTANE) 0.4-0.3 % SOLN Apply 1 drop to eye daily as needed (dry eyes).   pregabalin (LYRICA) 75 MG capsule Take 75 mg by mouth 2 (two) times daily.   rosuvastatin (CRESTOR) 40 MG tablet Take 1 tablet (40 mg total) by mouth every evening.   traMADol (ULTRAM) 50 MG tablet Take 50 mg by mouth as needed.     Allergies:   Bee venom, Elastic bandages & [zinc], Mango flavor, Adhesive [tape], and Triple antibiotic [bacitracin-neomycin-polymyxin]   Social History   Socioeconomic History   Marital status: Widowed    Spouse name: Not on file   Number of children: 2   Years of education: Not on file   Highest education level: Not on file  Occupational History   Occupation: Education officer, community  Tobacco Use   Smoking status: Former    Types: Cigarettes    Quit date: 03/21/2011    Years since quitting: 10.9   Smokeless tobacco: Never  Vaping Use   Vaping Use: Never used  Substance and Sexual Activity   Alcohol use: Yes    Comment: rare   Drug use: No   Sexual activity: Yes    Birth control/protection: Surgical    Comment: hyst  Other Topics Concern   Not on file  Social History Narrative   Not on file   Social Determinants of Health   Financial Resource Strain: Not on file  Food Insecurity: Not on file  Transportation Needs: Not on file  Physical Activity: Not on file  Stress: Not on file  Social Connections: Not on file      Family History:  [name]  family history includes Arthritis in her mother; COPD in her mother; Diabetes in her maternal aunt, maternal aunt, and maternal grandmother; Heart disease in her father and mother; Psoriasis in her father; Stroke in her mother.   ROS:   Please see the history of present illness.    ROS All other systems reviewed and are negative.   PHYSICAL EXAM:   VS:  BP 104/64   Pulse 80   Ht  (1.626 m)   Wt 176 lb (79.8 kg)   SpO2 97%   BMI 30.21 kg/m  Physical Exam  GEN: Well nourished, well developed, in no acute distress  Neck: no JVD, carotid bruits, or masses Cardiac:RRR; no murmurs, rubs, or gallops  Respiratory:  clear to auscultation bilaterally, normal work of breathing GI: soft, nontender, nondistended, + BS Ext: without cyanosis, clubbing, or edema, Good distal pulses bilaterally Neuro:  Alert and Oriented x 3, Psych: euthymic mood, full affect  Wt Readings from Last 3 Encounters:  03/05/22 176 lb (79.8 kg)  02/13/21 174 lb (78.9 kg)  01/25/21 175 lb (79.4 kg)      Studies/Labs Reviewed:   EKG:  EKG is  ordered today.  The ekg ordered today demonstrates NSR normal EKG  Recent Labs: No results found for requested labs within last 365 days.   Lipid Panel    Component Value Date/Time   CHOL 177 11/10/2020 0404   TRIG 113 11/10/2020 0404   HDL 37 (L) 11/10/2020 0404   CHOLHDL 4.8 11/10/2020 0404   VLDL 23 11/10/2020 0404   LDLCALC 117 (H) 11/10/2020 0404    Additional studies/ records that were reviewed today include:   CT abdomen 01/25/21 Lower chest: A 6 mm noncalcified lung nodule is seen within the lateral aspect of the right lower lobe (axial CT image 3, CT series 6). A 5 mm noncalcified lung nodule is seen within the left lung base (axial CT image 8, CT series 6). These represent new findings when compared to the prior study.   IMPRESSION: 1. Subcentimeter bilateral lower lobe noncalcified lung nodules. Further  evaluation with chest CT is recommended to determine the presence or absence of additional noncalcified lung nodules. 2. Evidence of prior cholecystectomy and prior hysterectomy. 3. No acute or active process within the abdomen or pelvis.   11/10/20 Lexiscan stress is electrically negative Myovue scan shows normal perfusion No significant ischemia or scar LVEF calculated at 60% Overall low risk study.     Risk Assessment/Calculations:         ASSESSMENT:    1. Coronary artery disease involving native coronary artery of native heart without angina pectoris   2. Essential hypertension   3. Mixed hyperlipidemia   4. History of DVT (deep vein thrombosis)   5. Lung nodules   6. Leg swelling      PLAN:  In order of problems listed above:  CAD status post stenting of the RCA, CABG 09/2016 with hospitalization for chest pain 10/2020. Chest pain most likely musculoskeletal with normal Lexiscan Myoview 11/10/2020-no further angina. She does have chest wall pain from heavy lifting at work and is asking for lanacane patches. I've asked her to check with her PCP who she sees on Wed     Hypertension BP runs low and not on meds   HLD Zetia added to Crestor-LDL 139 on KPN 10/2021. Will refer to lipid clinic for LDL goal < 70.   DVT 01/25/21 started on eliquis-managed by PCP   Lung nodules incidental finding on abdominal CT-never had f/u CT which she needs. Will ask PCP to order.  LE edema after being on her feet all day. Also getting extra salt with frozen dinners. 2 gm sodium and will update echo. She doesn't want compression hose.  Shared Decision Making/Informed Consent        Medication Adjustments/Labs and Tests Ordered: Current medicines are reviewed at length with the patient today.  Concerns regarding medicines are outlined above.  Medication changes, Labs and Tests ordered today are listed in the Patient Instructions below. Patient Instructions  Medication Instructions:  Your  physician recommends that you continue on your current medications as directed. Please refer to the Current Medication list given to you today.   Labwork: None today  Testing/Procedures: Your physician has requested that you have an echocardiogram. Echocardiography is a painless test that uses sound waves to create images of your heart. It provides your doctor with information about the size and shape of your heart and how well your heart's chambers and valves are working. This procedure takes approximately one hour. There are no restrictions for this procedure.   Follow-Up: 1 year Dr.Branch  Any Other Special Instructions Will Be Listed Below (If Applicable).    You have been referred to [facility]. They will call for an appointment.    If you need a refill on your cardiac medications before your next appointment, please call your pharmacy.    Two Gram Sodium Diet 2000 mg  What is Sodium? Sodium is a mineral found naturally in many foods. The most significant source of sodium in the diet is table salt, which is about 40% sodium.  Processed, convenience, and preserved foods also contain a large amount of sodium.  The body needs only 500 mg of sodium daily to function,  A normal diet provides more than enough sodium even if you do not use salt.  Why Limit Sodium? A build up of sodium in the body can cause thirst, increased blood pressure, shortness of breath, and water retention.  Decreasing sodium in the diet can reduce edema and risk of heart attack or stroke associated with high blood pressure.  Keep in mind that there are many other factors involved in these health problems.  Heredity, obesity, lack of exercise, cigarette smoking, stress and what you eat all play a role.  General Guidelines: Do not add salt at the table or in cooking.  One teaspoon of salt contains over 2 grams of sodium. Read food labels Avoid processed and convenience foods Ask your dietitian  before eating any foods not dicussed in the menu planning guidelines Consult your physician if you wish to use a salt substitute or a sodium containing medication such as antacids.  Limit milk and milk products to 16 oz (2 cups) per day.  Shopping Hints: READ LABELS!! "Dietetic" does not necessarily mean low sodium. Salt and other sodium ingredients are often added to foods during processing.    Menu Planning Guidelines Food Group Choose More Often Avoid  Beverages (see also the milk group All fruit juices, low-sodium, salt-free vegetables juices, low-sodium carbonated beverages Regular vegetable or tomato juices, commercially softened water used for drinking or cooking  Breads and Cereals Enriched white, wheat, rye and pumpernickel bread, hard rolls and dinner rolls; muffins, cornbread and waffles; most dry cereals, cooked cereal without added salt; unsalted crackers and breadsticks; low sodium or homemade bread crumbs Bread, rolls and crackers with salted tops; quick breads; instant hot cereals; pancakes; commercial bread stuffing; self-rising flower and biscuit mixes; regular bread crumbs or cracker crumbs  Desserts and Sweets Desserts and sweets mad with mild should be within allowance Instant pudding mixes and cake mixes  Fats Butter or margarine; vegetable oils; unsalted salad dressings, regular salad dressings limited to 1 Tbs; light, sour and heavy cream Regular salad dressings containing bacon fat, bacon bits, and salt pork; snack dips made with instant soup mixes or processed cheese; salted nuts  Fruits Most fresh, frozen and canned fruits Fruits processed with salt or sodium-containing ingredient (some dried fruits are processed with sodium  sulfites        Vegetables Fresh, frozen vegetables and low- sodium canned vegetables Regular canned vegetables, sauerkraut, pickled vegetables, and others prepared in brine; frozen vegetables in sauces; vegetables seasoned with ham, bacon or salt  pork  Condiments, Sauces, Miscellaneous  Salt substitute with physician's approval; pepper, herbs, spices; vinegar, lemon or lime juice; hot pepper sauce; garlic powder, onion powder, low sodium soy sauce (1 Tbs.); low sodium condiments (ketchup, chili sauce, mustard) in limited amounts (1 tsp.) fresh ground horseradish; unsalted tortilla chips, pretzels, potato chips, popcorn, salsa (1/4 cup) Any seasoning made with salt including garlic salt, celery salt, onion salt, and seasoned salt; sea salt, rock salt, kosher salt; meat tenderizers; monosodium glutamate; mustard, regular soy sauce, barbecue, sauce, chili sauce, teriyaki sauce, steak sauce, Worcestershire sauce, and most flavored vinegars; canned gravy and mixes; regular condiments; salted snack foods, olives, picles, relish, horseradish sauce, catsup   Food preparation: Try these seasonings Meats:    Pork Sage, onion Serve with applesauce  Chicken Poultry seasoning, thyme, parsley Serve with cranberry sauce  Lamb Curry powder, rosemary, garlic, thyme Serve with mint sauce or jelly  Veal Marjoram, basil Serve with current jelly, cranberry sauce  Beef Pepper, bay leaf Serve with dry mustard, unsalted chive butter  Fish Bay leaf, dill Serve with unsalted lemon butter, unsalted parsley butter  Vegetables:    Asparagus Lemon juice   Broccoli Lemon juice   Carrots Mustard dressing parsley, mint, nutmeg, glazed with unsalted butter and sugar   Green beans Marjoram, lemon juice, nutmeg,dill seed   Tomatoes Basil, marjoram, onion   Spice /blend for [employer]" 4 tsp ground thyme 1 tsp ground sage 3 tsp ground rosemary 4 tsp ground marjoram   Test your knowledge A product that says "Salt Free" may still contain sodium. True or False Garlic Powder and Hot Pepper Sauce an be used as alternative seasonings.True or False Processed foods have more sodium than fresh foods.  True or False Canned Vegetables have less sodium than froze True or  False   WAYS TO DECREASE YOUR SODIUM INTAKE Avoid the use of added salt in cooking and at the table.  Table salt (and other prepared seasonings which contain salt) is probably one of the greatest sources of sodium in the diet.  Unsalted foods can gain flavor from the sweet, sour, and butter taste sensations of herbs and spices.  Instead of using salt for seasoning, try the following seasonings with the foods listed.  Remember: how you use them to enhance natural food flavors is limited only by your creativity... Allspice-Meat, fish, eggs, fruit, peas, red and yellow vegetables Almond Extract-Fruit baked goods Anise Seed-Sweet breads, fruit, carrots, beets, cottage cheese, cookies (tastes like licorice) Basil-Meat, fish, eggs, vegetables, rice, vegetables salads, soups, sauces Bay Leaf-Meat, fish, stews, poultry Burnet-Salad, vegetables (cucumber-like flavor) Caraway Seed-Bread, cookies, cottage cheese, meat, vegetables, cheese, rice Cardamon-Baked goods, fruit, soups Celery Powder or seed-Salads, salad dressings, sauces, meatloaf, soup, bread.Do not use  celery salt Chervil-Meats, salads, fish, eggs, vegetables, cottage cheese (parsley-like flavor) Chili Power-Meatloaf, chicken cheese, corn, eggplant, egg dishes Chives-Salads cottage cheese, egg dishes, soups, vegetables, sauces Cilantro-Salsa, casseroles Cinnamon-Baked goods, fruit, pork, lamb, chicken, carrots Cloves-Fruit, baked goods, fish, pot roast, green beans, beets, carrots Coriander-Pastry, cookies, meat, salads, cheese (lemon-orange flavor) Cumin-Meatloaf, fish,cheese, eggs, cabbage,fruit pie (caraway flavor) [employer], fruit, eggs, fish, poultry, cottage cheese, vegetables Dill Seed-Meat, cottage cheese, poultry, vegetables, fish, salads, bread Fennel Seed-Bread, cookies, apples, pork, eggs, fish, beets, cabbage, cheese, Licorice-like  flavor Garlic-(buds or powder) Salads, meat, poultry, fish, bread, butter, vegetables,  potatoes.Do not  use garlic salt Ginger-Fruit, vegetables, baked goods, meat, fish, poultry Horseradish Root-Meet, vegetables, butter Lemon Juice or Extract-Vegetables, fruit, tea, baked goods, fish salads Mace-Baked goods fruit, vegetables, fish, poultry (taste like nutmeg) Maple Extract-Syrups Marjoram-Meat, chicken, fish, vegetables, breads, green salads (taste like Sage) Mint-Tea, lamb, sherbet, vegetables, desserts, carrots, cabbage Mustard, Dry or Seed-Cheese, eggs, meats, vegetables, poultry Nutmeg-Baked goods, fruit, chicken, eggs, vegetables, desserts Onion Powder-Meat, fish, poultry, vegetables, cheese, eggs, bread, rice salads (Do not use   Onion salt) Orange Extract-Desserts, baked goods Oregano-Pasta, eggs, cheese, onions, pork, lamb, fish, chicken, vegetables, green salads Paprika-Meat, fish, poultry, eggs, cheese, vegetables Parsley Flakes-Butter, vegetables, meat fish, poultry, eggs, bread, salads (certain forms may   Contain sodium Pepper-Meat fish, poultry, vegetables, eggs Peppermint Extract-Desserts, baked goods Poppy Seed-Eggs, bread, cheese, fruit dressings, baked goods, noodles, vegetables, cottage  [employer], poultry, meat, fish, cauliflower, turnips,eggs bread Saffron-Rice, bread, veal, chicken, fish, eggs Sage-Meat, fish, poultry, onions, eggplant, tomateos, pork, stews Savory-Eggs, salads, poultry, meat, rice, vegetables, soups, pork Tarragon-Meat, poultry, fish, eggs, butter, vegetables (licorice-like flavor)  Thyme-Meat, poultry, fish, eggs, vegetables, (clover-like flavor), sauces, soups Tumeric-Salads, butter, eggs, fish, rice, vegetables (saffron-like flavor) Vanilla Extract-Baked goods, candy Vinegar-Salads, vegetables, meat marinades Walnut Extract-baked goods, candy   2. Choose your Foods Wisely   The following is a list of foods to avoid which are high in sodium:  Meats-Avoid all smoked, canned, salt  cured, dried and kosher meat and fish as well as Anchovies   [employer] meats:Bologna, Liverwurst, Pastrami Canned meat or fish  Marinated herring Caviar    Pepperoni Corned Beef   Pizza Dried chipped beef  Salami Frozen breaded fish or meat Salt pork Frankfurters or hot dogs  Sardines Gefilte fish   Sausage Ham (boiled ham, Proscuitto Smoked butt    spiced ham)   Spam      TV Dinners Vegetables Canned vegetables (Regular) Relish Canned mushrooms  Sauerkraut Olives    Tomato juice Pickles  Bakery and Dessert Products Canned puddings  Cream pies Cheesecake   Decorated cakes Cookies  Beverages/Juices Tomato juice, regular  Gatorade   V-8 vegetable juice, regular  Breads and Cereals Biscuit mixes   Salted potato chips, corn chips, pretzels Bread stuffing mixes  Salted crackers and rolls Pancake and waffle mixes Self-rising flour  Seasonings Accent    Meat sauces Barbecue sauce  Meat tenderizer Catsup    Monosodium glutamate (MSG) Celery salt   Onion salt Chili sauce   Prepared mustard Garlic salt   Salt, seasoned salt, sea salt Gravy mixes   Soy sauce Horseradish   Steak sauce Ketchup   Tartar sauce Lite salt    Teriyaki sauce Marinade mixes   Worcestershire sauce  Others Baking powder   Cocoa and cocoa mixes Baking soda   Commercial casserole mixes Candy-caramels, chocolate  Dehydrated soups    Bars, fudge,nougats  Instant rice and pasta mixes Canned broth or soup  Maraschino cherries Cheese, aged and processed cheese and cheese spreads  Learning Assessment Quiz  Indicated T (for True) or F (for False) for each of the following statements:  _____ Fresh fruits and vegetables and unprocessed grains are generally low in sodium _____ Water may contain a considerable amount of sodium, depending on the source _____ You can always tell if a food is high in sodium by tasting it _____ Certain laxatives my be high in sodium  and should be avoided unless  prescribed   by a physician or pharmacist _____ Salt substitutes may be used freely by anyone on a sodium restricted diet _____ Sodium is present in table salt, food additives and as a natural component of   most foods _____ Table salt is approximately 90% sodium _____ Limiting sodium intake may help prevent excess fluid accumulation in the body _____ On a sodium-restricted diet, seasonings such as bouillon soy sauce, and    cooking wine should be used in place of table salt _____ On an ingredient list, a product which lists monosodium glutamate as the first   ingredient is an appropriate food to include on a low sodium diet  Circle the best answer(s) to the following statements (Hint: there may be more than one correct answer)  11. On a low-sodium diet, some acceptable snack items are:    A. Olives  F. Bean dip   K. Grapefruit juice    B. Salted Pretzels G. Commercial Popcorn   L. Canned peaches    C. Carrot Sticks  H. Bouillon   M. Unsalted nuts   D. [place] fries  I. Peanut butter crackers N. Salami   E. Sweet pickles J. Tomato Juice   O. Pizza  12.  Seasonings that may be used freely on a reduced - sodium diet include   A. Lemon wedges F.Monosodium glutamate K. Celery seed    B.Soysauce   G. Pepper   L. Mustard powder   C. Sea salt  H. Cooking wine  M. Onion flakes   D. Vinegar  E. Prepared horseradish N. Salsa   E. Sage   J. Worcestershire sauce  O. Chutney      Signed, [name], PA-C  03/05/2022 1:55 PM    [facility] [address] [place], [place], [place]  [address] Phone: [phone]; Fax: [phone]

## 2022-03-05 ENCOUNTER — Telehealth: Payer: Self-pay

## 2022-03-05 ENCOUNTER — Encounter: Payer: Self-pay | Admitting: Physician Assistant

## 2022-03-05 ENCOUNTER — Ambulatory Visit: Payer: 59 | Admitting: Physician Assistant

## 2022-03-05 VITALS — BP 104/64 | HR 80 | Ht 64.0 in | Wt 176.0 lb

## 2022-03-05 DIAGNOSIS — I2510 Atherosclerotic heart disease of native coronary artery without angina pectoris: Secondary | ICD-10-CM

## 2022-03-05 DIAGNOSIS — I10 Essential (primary) hypertension: Secondary | ICD-10-CM

## 2022-03-05 DIAGNOSIS — Z86718 Personal history of other venous thrombosis and embolism: Secondary | ICD-10-CM

## 2022-03-05 DIAGNOSIS — E782 Mixed hyperlipidemia: Secondary | ICD-10-CM

## 2022-03-05 DIAGNOSIS — M7989 Other specified soft tissue disorders: Secondary | ICD-10-CM

## 2022-03-05 DIAGNOSIS — R918 Other nonspecific abnormal finding of lung field: Secondary | ICD-10-CM

## 2022-03-05 MED ORDER — EZETIMIBE 10 MG PO TABS: 10.0000 mg | ORAL_TABLET | Freq: Every day | ORAL | 3 refills | Status: DC

## 2022-03-05 NOTE — Patient Instructions (Addendum)
Medication Instructions:  Your physician recommends that you continue on your current medications as directed. Please refer to the Current Medication list given to you today.   Labwork: None today  Testing/Procedures: Your physician has requested that you have an echocardiogram. Echocardiography is a painless test that uses sound waves to create images of your heart. It provides your doctor with information about the size and shape of your heart and how well your heart's chambers and valves are working. This procedure takes approximately one hour. There are no restrictions for this procedure.   Follow-Up: 1 year Dr.Branch  Any Other Special Instructions Will Be Listed Below (If Applicable).    You have been referred to [facility]. They will call for an appointment.    If you need a refill on your cardiac medications before your next appointment, please call your pharmacy.    Two Gram Sodium Diet 2000 mg  What is Sodium? Sodium is a mineral found naturally in many foods. The most significant source of sodium in the diet is table salt, which is about 40% sodium.  Processed, convenience, and preserved foods also contain a large amount of sodium.  The body needs only 500 mg of sodium daily to function,  A normal diet provides more than enough sodium even if you do not use salt.  Why Limit Sodium? A build up of sodium in the body can cause thirst, increased blood pressure, shortness of breath, and water retention.  Decreasing sodium in the diet can reduce edema and risk of heart attack or stroke associated with high blood pressure.  Keep in mind that there are many other factors involved in these health problems.  Heredity, obesity, lack of exercise, cigarette smoking, stress and what you eat all play a role.  General Guidelines: Do not add salt at the table or in cooking.  One teaspoon of salt contains over 2 grams of sodium. Read food labels Avoid processed and  convenience foods Ask your dietitian before eating any foods not dicussed in the menu planning guidelines Consult your physician if you wish to use a salt substitute or a sodium containing medication such as antacids.  Limit milk and milk products to 16 oz (2 cups) per day.  Shopping Hints: READ LABELS!! "Dietetic" does not necessarily mean low sodium. Salt and other sodium ingredients are often added to foods during processing.    Menu Planning Guidelines Food Group Choose More Often Avoid  Beverages (see also the milk group All fruit juices, low-sodium, salt-free vegetables juices, low-sodium carbonated beverages Regular vegetable or tomato juices, commercially softened water used for drinking or cooking  Breads and Cereals Enriched white, wheat, rye and pumpernickel bread, hard rolls and dinner rolls; muffins, cornbread and waffles; most dry cereals, cooked cereal without added salt; unsalted crackers and breadsticks; low sodium or homemade bread crumbs Bread, rolls and crackers with salted tops; quick breads; instant hot cereals; pancakes; commercial bread stuffing; self-rising flower and biscuit mixes; regular bread crumbs or cracker crumbs  Desserts and Sweets Desserts and sweets mad with mild should be within allowance Instant pudding mixes and cake mixes  Fats Butter or margarine; vegetable oils; unsalted salad dressings, regular salad dressings limited to 1 Tbs; light, sour and heavy cream Regular salad dressings containing bacon fat, bacon bits, and salt pork; snack dips made with instant soup mixes or processed cheese; salted nuts  Fruits Most fresh, frozen and canned fruits Fruits processed with salt or sodium-containing ingredient (some dried fruits are processed  with sodium sulfites        Vegetables Fresh, frozen vegetables and low- sodium canned vegetables Regular canned vegetables, sauerkraut, pickled vegetables, and others prepared in brine; frozen vegetables in sauces;  vegetables seasoned with ham, bacon or salt pork  Condiments, Sauces, Miscellaneous  Salt substitute with physician's approval; pepper, herbs, spices; vinegar, lemon or lime juice; hot pepper sauce; garlic powder, onion powder, low sodium soy sauce (1 Tbs.); low sodium condiments (ketchup, chili sauce, mustard) in limited amounts (1 tsp.) fresh ground horseradish; unsalted tortilla chips, pretzels, potato chips, popcorn, salsa (1/4 cup) Any seasoning made with salt including garlic salt, celery salt, onion salt, and seasoned salt; sea salt, rock salt, kosher salt; meat tenderizers; monosodium glutamate; mustard, regular soy sauce, barbecue, sauce, chili sauce, teriyaki sauce, steak sauce, Worcestershire sauce, and most flavored vinegars; canned gravy and mixes; regular condiments; salted snack foods, olives, picles, relish, horseradish sauce, catsup   Food preparation: Try these seasonings Meats:    Pork Sage, onion Serve with applesauce  Chicken Poultry seasoning, thyme, parsley Serve with cranberry sauce  Lamb Curry powder, rosemary, garlic, thyme Serve with mint sauce or jelly  Veal Marjoram, basil Serve with current jelly, cranberry sauce  Beef Pepper, bay leaf Serve with dry mustard, unsalted chive butter  Fish [place] leaf, dill Serve with unsalted lemon butter, unsalted parsley butter  Vegetables:    Asparagus Lemon juice   Broccoli Lemon juice   Carrots Mustard dressing parsley, mint, nutmeg, glazed with unsalted butter and sugar   Green beans Marjoram, lemon juice, nutmeg,dill seed   Tomatoes Basil, marjoram, onion   Spice /blend for [employer]" 4 tsp ground thyme 1 tsp ground sage 3 tsp ground rosemary 4 tsp ground marjoram   Test your knowledge A product that says "Salt Free" may still contain sodium. True or False Garlic Powder and Hot Pepper Sauce an be used as alternative seasonings.True or False Processed foods have more sodium than fresh foods.  True or False Canned Vegetables  have less sodium than froze True or False   WAYS TO DECREASE YOUR SODIUM INTAKE Avoid the use of added salt in cooking and at the table.  Table salt (and other prepared seasonings which contain salt) is probably one of the greatest sources of sodium in the diet.  Unsalted foods can gain flavor from the sweet, sour, and butter taste sensations of herbs and spices.  Instead of using salt for seasoning, try the following seasonings with the foods listed.  Remember: how you use them to enhance natural food flavors is limited only by your creativity... Allspice-Meat, fish, eggs, fruit, peas, red and yellow vegetables Almond Extract-Fruit baked goods Anise Seed-Sweet breads, fruit, carrots, beets, cottage cheese, cookies (tastes like licorice) Basil-Meat, fish, eggs, vegetables, rice, vegetables salads, soups, sauces Bay Leaf-Meat, fish, stews, poultry Burnet-Salad, vegetables (cucumber-like flavor) Caraway Seed-Bread, cookies, cottage cheese, meat, vegetables, cheese, rice Cardamon-Baked goods, fruit, soups Celery Powder or seed-Salads, salad dressings, sauces, meatloaf, soup, bread.Do not use  celery salt Chervil-Meats, salads, fish, eggs, vegetables, cottage cheese (parsley-like flavor) Chili Power-Meatloaf, chicken cheese, corn, eggplant, egg dishes Chives-Salads cottage cheese, egg dishes, soups, vegetables, sauces Cilantro-Salsa, casseroles Cinnamon-Baked goods, fruit, pork, lamb, chicken, carrots Cloves-Fruit, baked goods, fish, pot roast, green beans, beets, carrots Coriander-Pastry, cookies, meat, salads, cheese (lemon-orange flavor) Cumin-Meatloaf, fish,cheese, eggs, cabbage,fruit pie (caraway flavor) [employer], fruit, eggs, fish, poultry, cottage cheese, vegetables Dill Seed-Meat, cottage cheese, poultry, vegetables, fish, salads, bread Fennel Seed-Bread, cookies, apples, pork, eggs, fish, beets, cabbage,  cheese, Licorice-like flavor Garlic-(buds or powder) Salads, meat,  poultry, fish, bread, butter, vegetables, potatoes.Do not  use garlic salt Ginger-Fruit, vegetables, baked goods, meat, fish, poultry Horseradish Root-Meet, vegetables, butter Lemon Juice or Extract-Vegetables, fruit, tea, baked goods, fish salads Mace-Baked goods fruit, vegetables, fish, poultry (taste like nutmeg) Maple Extract-Syrups Marjoram-Meat, chicken, fish, vegetables, breads, green salads (taste like Sage) Mint-Tea, lamb, sherbet, vegetables, desserts, carrots, cabbage Mustard, Dry or Seed-Cheese, eggs, meats, vegetables, poultry Nutmeg-Baked goods, fruit, chicken, eggs, vegetables, desserts Onion Powder-Meat, fish, poultry, vegetables, cheese, eggs, bread, rice salads (Do not use   Onion salt) Orange Extract-Desserts, baked goods Oregano-Pasta, eggs, cheese, onions, pork, lamb, fish, chicken, vegetables, green salads Paprika-Meat, fish, poultry, eggs, cheese, vegetables Parsley Flakes-Butter, vegetables, meat fish, poultry, eggs, bread, salads (certain forms may   Contain sodium Pepper-Meat fish, poultry, vegetables, eggs Peppermint Extract-Desserts, baked goods Poppy Seed-Eggs, bread, cheese, fruit dressings, baked goods, noodles, vegetables, cottage  [employer], poultry, meat, fish, cauliflower, turnips,eggs bread Saffron-Rice, bread, veal, chicken, fish, eggs Sage-Meat, fish, poultry, onions, eggplant, tomateos, pork, stews Savory-Eggs, salads, poultry, meat, rice, vegetables, soups, pork Tarragon-Meat, poultry, fish, eggs, butter, vegetables (licorice-like flavor)  Thyme-Meat, poultry, fish, eggs, vegetables, (clover-like flavor), sauces, soups Tumeric-Salads, butter, eggs, fish, rice, vegetables (saffron-like flavor) Vanilla Extract-Baked goods, candy Vinegar-Salads, vegetables, meat marinades Walnut Extract-baked goods, candy   2. Choose your Foods Wisely   The following is a list of foods to avoid which are high in  sodium:  Meats-Avoid all smoked, canned, salt cured, dried and kosher meat and fish as well as Anchovies   [employer] meats:Bologna, Liverwurst, Pastrami Canned meat or fish  Marinated herring Caviar    Pepperoni Corned Beef   Pizza Dried chipped beef  Salami Frozen breaded fish or meat Salt pork Frankfurters or hot dogs  Sardines Gefilte fish   Sausage Ham (boiled ham, Proscuitto Smoked butt    spiced ham)   Spam      TV Dinners Vegetables Canned vegetables (Regular) Relish Canned mushrooms  Sauerkraut Olives    Tomato juice Pickles  Bakery and Dessert Products Canned puddings  Cream pies Cheesecake   Decorated cakes Cookies  Beverages/Juices Tomato juice, regular  Gatorade   V-8 vegetable juice, regular  Breads and Cereals Biscuit mixes   Salted potato chips, corn chips, pretzels Bread stuffing mixes  Salted crackers and rolls Pancake and waffle mixes Self-rising flour  Seasonings Accent    Meat sauces Barbecue sauce  Meat tenderizer Catsup    Monosodium glutamate (MSG) Celery salt   Onion salt Chili sauce   Prepared mustard Garlic salt   Salt, seasoned salt, sea salt Gravy mixes   Soy sauce Horseradish   Steak sauce Ketchup   Tartar sauce Lite salt    Teriyaki sauce Marinade mixes   Worcestershire sauce  Others Baking powder   Cocoa and cocoa mixes Baking soda   Commercial casserole mixes Candy-caramels, chocolate  Dehydrated soups    Bars, fudge,nougats  Instant rice and pasta mixes Canned broth or soup  Maraschino cherries Cheese, aged and processed cheese and cheese spreads  Learning Assessment Quiz  Indicated T (for True) or F (for False) for each of the following statements:  _____ Fresh fruits and vegetables and unprocessed grains are generally low in sodium _____ Water may contain a considerable amount of sodium, depending on the source _____ You can always tell if a food is high in sodium by tasting it _____ Certain laxatives my be  high in sodium and should be avoided unless prescribed   by a physician or pharmacist _____ Salt substitutes may be used freely by anyone on a sodium restricted diet _____ Sodium is present in table salt, food additives and as a natural component of   most foods _____ Table salt is approximately 90% sodium _____ Limiting sodium intake may help prevent excess fluid accumulation in the body _____ On a sodium-restricted diet, seasonings such as bouillon soy sauce, and    cooking wine should be used in place of table salt _____ On an ingredient list, a product which lists monosodium glutamate as the first   ingredient is an appropriate food to include on a low sodium diet  Circle the best answer(s) to the following statements (Hint: there may be more than one correct answer)  11. On a low-sodium diet, some acceptable snack items are:    A. Olives  F. Bean dip   K. Grapefruit juice    B. Salted Pretzels G. Commercial Popcorn   L. Canned peaches    C. Carrot Sticks  H. Bouillon   M. Unsalted nuts   D. [place] fries  I. Peanut butter crackers N. Salami   E. Sweet pickles J. Tomato Juice   O. Pizza  12.  Seasonings that may be used freely on a reduced - sodium diet include   A. Lemon wedges F.Monosodium glutamate K. Celery seed    B.Soysauce   G. Pepper   L. Mustard powder   C. Sea salt  H. Cooking wine  M. Onion flakes   D. Vinegar  E. Prepared horseradish N. Salsa   E. Sage   J. Worcestershire sauce  O. Chutney

## 2022-03-05 NOTE — Telephone Encounter (Signed)
Medication refill request approved and sent to pharmacy.  

## 2022-03-07 ENCOUNTER — Other Ambulatory Visit: Payer: Self-pay | Admitting: Nurse Practitioner

## 2022-03-07 ENCOUNTER — Other Ambulatory Visit (HOSPITAL_COMMUNITY): Payer: Self-pay | Admitting: Nurse Practitioner

## 2022-03-07 DIAGNOSIS — R918 Other nonspecific abnormal finding of lung field: Secondary | ICD-10-CM

## 2022-03-12 ENCOUNTER — Ambulatory Visit (HOSPITAL_COMMUNITY)
Admission: RE | Admit: 2022-03-12 | Discharge: 2022-03-12 | Disposition: A | Discharge status: Home / Self Care | Payer: 59 | Source: Ambulatory Visit | Attending: Physician Assistant | Admitting: Physician Assistant

## 2022-03-12 DIAGNOSIS — M7989 Other specified soft tissue disorders: Secondary | ICD-10-CM | POA: Insufficient documentation

## 2022-03-12 DIAGNOSIS — I2510 Atherosclerotic heart disease of native coronary artery without angina pectoris: Secondary | ICD-10-CM | POA: Diagnosis not present

## 2022-03-12 LAB — ECHOCARDIOGRAM COMPLETE
Area-P 1/2: 2.48 cm2
S' Lateral: 2.9 cm

## 2022-03-12 NOTE — Progress Notes (Signed)
*  PRELIMINARY RESULTS* Echocardiogram 2D Echocardiogram has been performed.  Stacey Drain 03/12/2022, 3:32 PM

## 2022-03-21 ENCOUNTER — Encounter: Payer: Self-pay | Admitting: *Deleted

## 2022-04-05 ENCOUNTER — Other Ambulatory Visit: Payer: Self-pay

## 2022-04-05 ENCOUNTER — Emergency Department (HOSPITAL_COMMUNITY): Payer: No Typology Code available for payment source

## 2022-04-05 ENCOUNTER — Emergency Department (HOSPITAL_COMMUNITY): Payer: 59

## 2022-04-05 ENCOUNTER — Emergency Department (HOSPITAL_COMMUNITY)
Admission: EM | Admit: 2022-04-05 | Discharge: 2022-04-05 | Disposition: A | Discharge status: Home / Self Care | Payer: No Typology Code available for payment source | Attending: Emergency Medicine | Admitting: Emergency Medicine

## 2022-04-05 ENCOUNTER — Encounter (HOSPITAL_COMMUNITY): Payer: Self-pay

## 2022-04-05 DIAGNOSIS — I2510 Atherosclerotic heart disease of native coronary artery without angina pectoris: Secondary | ICD-10-CM | POA: Insufficient documentation

## 2022-04-05 DIAGNOSIS — I10 Essential (primary) hypertension: Secondary | ICD-10-CM | POA: Diagnosis not present

## 2022-04-05 DIAGNOSIS — W19XXXA Unspecified fall, initial encounter: Secondary | ICD-10-CM

## 2022-04-05 DIAGNOSIS — S8992XA Unspecified injury of left lower leg, initial encounter: Secondary | ICD-10-CM | POA: Diagnosis present

## 2022-04-05 DIAGNOSIS — R519 Headache, unspecified: Secondary | ICD-10-CM | POA: Diagnosis not present

## 2022-04-05 DIAGNOSIS — Z79899 Other long term (current) drug therapy: Secondary | ICD-10-CM | POA: Insufficient documentation

## 2022-04-05 DIAGNOSIS — Z7901 Long term (current) use of anticoagulants: Secondary | ICD-10-CM | POA: Insufficient documentation

## 2022-04-05 DIAGNOSIS — W1830XA Fall on same level, unspecified, initial encounter: Secondary | ICD-10-CM | POA: Insufficient documentation

## 2022-04-05 DIAGNOSIS — S82132A Displaced fracture of medial condyle of left tibia, initial encounter for closed fracture: Secondary | ICD-10-CM | POA: Diagnosis not present

## 2022-04-05 MED ORDER — OXYCODONE HCL 5 MG PO TABS: 5.0000 mg | ORAL_TABLET | ORAL | 0 refills | Status: DC | PRN

## 2022-04-05 MED ORDER — LACTATED RINGERS IV BOLUS
1000.0000 mL | Freq: Once | INTRAVENOUS | Status: AC
  Administered 2022-04-05: 1000 mL via INTRAVENOUS

## 2022-04-05 MED ORDER — FENTANYL CITRATE PF 50 MCG/ML IJ SOSY
50.0000 ug | PREFILLED_SYRINGE | Freq: Once | INTRAMUSCULAR | Status: AC
  Administered 2022-04-05: 50 ug via INTRAVENOUS
  Filled 2022-04-05: qty 1

## 2022-04-05 MED ORDER — MORPHINE SULFATE (PF) 4 MG/ML IV SOLN
4.0000 mg | Freq: Once | INTRAVENOUS | Status: AC
  Administered 2022-04-05: 4 mg via INTRAVENOUS
  Filled 2022-04-05: qty 1

## 2022-04-05 MED ORDER — ONDANSETRON HCL 4 MG/2ML IJ SOLN
4.0000 mg | Freq: Once | INTRAMUSCULAR | Status: AC
  Administered 2022-04-05: 4 mg via INTRAVENOUS
  Filled 2022-04-05: qty 2

## 2022-04-05 NOTE — Progress Notes (Signed)
Orthopedic Tech Progress Note Patient Details:  Carolyn Gaines 30-May-1966 [id number]   Ortho Devices Type of Ortho Device: Crutches, Knee Immobilizer Ortho Device/Splint Location: LLE, crutches at bedside Ortho Device/Splint Interventions: Ordered, Application, Adjustment   Post Interventions Patient Tolerated: Fair Instructions Provided: Poper ambulation with device, Care of device, Adjustment of device  Selmer Adduci Carmine Savoy 04/05/2022, 12:43 PM

## 2022-04-05 NOTE — ED Triage Notes (Signed)
Patient bib [facility] from work. Patient was at work and the floors were recently waxed patient tripped over her own feet and fell. Patient complains of left Knee pain. No LOC. Per EMS patient did not hit head. of fentanyl in route and ice applied. VSS

## 2022-04-05 NOTE — ED Provider Notes (Signed)
[facility] EMERGENCY DEPARTMENT Provider Note   CSN: [id number] Arrival date & time: 04/05/22  0805     History Chief Complaint  Patient presents with   Carolyn Gaines is a 56 y.o. female with history of CAD, hypertension, fibromyalgia presents the emergency department for evaluation after ground-level fall today.  Patient reports that the floors at her job were newly waxed and she slipped falling forward landing on both of her knees, both of her hands, however she does not think that she hit her head on the floor.  She currently is on Eliquis.  She denies any loss of consciousness.  Denies any pain to her chest, abdomen, or back.  Denies any blurry vision.  Denies any numbness or tingling.  She reports that her main pain is in her left knee.  Allergic to triple antibiotic, otherwise no other drug allergies.  Former smoker.   Fall Pertinent negatives include no chest pain, no abdominal pain, no headaches and no shortness of breath.       Home Medications Prior to Admission medications   Medication Sig Start Date End Date Taking? Authorizing Provider  acetaminophen (TYLENOL) 500 MG tablet Take 1,000 mg by mouth every 6 (six) hours as needed for mild pain.    [provider]  apixaban (ELIQUIS) 5 MG TABS tablet 1 tablet (5mg ) twice daily 08/30/21   10/28/21, MD  carvedilol (COREG) 3.125 MG tablet Take 1 tablet (3.125 mg total) by mouth 2 (two) times daily. 02/13/21   02/15/21, PA-C  dicyclomine (BENTYL) 20 MG tablet Take 1 tablet (20 mg total) by mouth 2 (two) times daily. Patient not taking: Reported on 02/13/2021 01/25/21   01/27/21, MD  dicyclomine (BENTYL) 20 MG tablet Take 1 pill every 12 hours as needed for abdominal cramps Patient not taking: Reported on 02/13/2021 01/25/21   01/27/21, MD  diphenhydrAMINE (BENADRYL) 2 % cream Apply 1 application topically 3 (three) times daily as needed for itching.    [provider]  DULoxetine (CYMBALTA) 60 MG capsule Take 60 mg by mouth daily.    [provider]  ezetimibe (ZETIA) 10 MG tablet Take 1 tablet (10 mg total) by mouth daily. 03/05/22   05/05/22, PA-C  methocarbamol (ROBAXIN) 500 MG tablet Take 1-2 tablets by mouth 3 (three) times daily as needed. Patient not taking: Reported on 01/25/2021 10/10/20   [provider]  nitroGLYCERIN (NITROSTAT) 0.4 MG SL tablet PLACE 1 TABLET UNDER THE TONGUE EVERY 5 MINUTES FOR 3 DOSES AS NEEDED FOR CHEST PAIN 02/13/21   02/15/21, PA-C  Olopatadine HCl (PATADAY OP) Apply 1 drop to eye daily as needed (dry eyes).    [provider]  pantoprazole (PROTONIX) 40 MG tablet Take 40 mg by mouth daily.    [provider]  Polyethyl Glycol-Propyl Glycol (SYSTANE) 0.4-0.3 % SOLN Apply 1 drop to eye daily as needed (dry eyes).    [provider]  pregabalin (LYRICA) 75 MG capsule Take 75 mg by mouth 2 (two) times daily. 02/26/22   [provider]  rosuvastatin (CRESTOR) 40 MG tablet Take 1 tablet (40 mg total) by mouth every evening. 02/13/21   02/15/21, PA-C  traMADol (ULTRAM) 50 MG tablet Take 50 mg by mouth as needed.    [provider]  valACYclovir (VALTREX) 1000 MG tablet Take 1,000 mg by mouth 2 (two) times daily. Patient not taking: Reported  on 01/25/2021 12/23/20   [provider]      Allergies    Bee venom, Elastic bandages & [zinc], Mango flavor, Adhesive [tape], and Triple antibiotic [bacitracin-neomycin-polymyxin]    Review of Systems   Review of Systems  Constitutional:  Negative for chills and fever.  Eyes:  Negative for visual disturbance.  Respiratory:  Negative for shortness of breath.   Cardiovascular:  Negative for chest pain.  Gastrointestinal:  Negative for abdominal pain.  Musculoskeletal:  Positive for arthralgias. Negative for back pain and neck pain.  Neurological:  Negative for syncope, weakness and  headaches.    Physical Exam Updated Vital Signs BP 122/66 (BP Location: Right Arm)   Pulse 67   Temp 98.4 F (36.9 C) (Oral)   Resp 16   Ht 5\' 3"  (1.6 m)   Wt 79.4 kg   SpO2 98%   BMI 31.00 kg/m  Physical Exam Vitals and nursing note reviewed.  Constitutional:      Appearance: She is not ill-appearing or toxic-appearing.     Comments: Uncomfortable, but not toxic appearing  HENT:     Head: Normocephalic and atraumatic.     Comments: No raccoon eyes or battle signs noted.  Scalp nontender.  No facial tenderness. Eyes:     Extraocular Movements: Extraocular movements intact.     Pupils: Pupils are equal, round, and reactive to light.  Cardiovascular:     Rate and Rhythm: Normal rate.  Pulmonary:     Effort: Pulmonary effort is normal.     Breath sounds: Normal breath sounds.  Chest:     Chest wall: No tenderness.  Abdominal:     General: Bowel sounds are normal.     Palpations: Abdomen is soft.     Tenderness: There is no abdominal tenderness. There is no guarding or rebound.  Musculoskeletal:     Cervical back: Normal range of motion. No tenderness.     Comments:   BACK -no midline or paraspinal cervical, thoracic, or lumbar tenderness palpation.  No step-offs or deformities noted.  RUE - tenderness to the thenar eminence of the hand.  No snuffbox tenderness.  Palpable pulses.  Brisk cap refill.  Compartments are soft.  No signs of trauma or swelling noted.  LUE - tenderness to the thenar eminence of the hand.  No snuffbox tenderness.  Palpable pulses.  Brisk cap refill.  Compartments are soft.  No signs of trauma or swelling noted.  RLE- Tenderness and bruise overlying the right knee. Compartments are soft. Pulses palpable. She is able to flex and extend.   LLE-significant tenderness noted to the knee and inferior knee/superior tibia.  Bruising overlying the patella and inferior knee.  Patient range of motion limited secondary to pain.  Is able to wiggle toes and flex  and extend ankle.  Palpable pulses.  Compartments are soft.    Neurological:     Mental Status: She is alert.     ED Results / Procedures / Treatments   Labs (all labs ordered are listed, but only abnormal results are displayed) Labs Reviewed - No data to display  EKG None  Radiology CT Knee Left Wo Contrast  Result Date: 04/05/2022 CLINICAL DATA:  Fall, left knee pain and swelling EXAM: CT OF THE LEFT KNEE WITHOUT CONTRAST TECHNIQUE: Multidetector CT imaging of the left knee was performed according to the standard protocol. Multiplanar CT image reconstructions were also generated. RADIATION DOSE REDUCTION: This exam was performed according to the departmental dose-optimization program which  includes automated exposure control, adjustment of the mA and/or kV according to patient size and/or use of iterative reconstruction technique. COMPARISON:  Same day radiographs. FINDINGS: Bones/Joint/Cartilage Acute nondisplaced fracture through the lateral aspect of the medial tibial plateau without articular surface depression or diastasis. There is a subtle bandlike area of sclerosis extending into the lateral aspect of the proximal tibial metaphysis suggesting a subtle trabecular fracture (series 6, images 43-59). No additional fracture. Joint spaces are preserved. Large layering knee joint lipohemarthrosis. Ligaments Suboptimally assessed by CT. Muscles and Tendons Musculotendinous structures within normal limits by CT. Soft tissues No soft tissue swelling or hematoma. IMPRESSION: 1. Acute nondisplaced fracture through the lateral aspect of the medial tibial plateau without articular surface depression or diastasis. 2. Subtle band-like area of sclerosis extending into the lateral aspect of the proximal tibial metaphysis suggesting a subtle trabecular fracture. 3. Large layering knee joint lipohemarthrosis. Electronically Signed   By: Duanne Guess D.O.   On: 04/05/2022 10:41   DG Knee Complete 4 Views  Left  Result Date: 04/05/2022 CLINICAL DATA:  Fall, left knee pain and swelling EXAM: LEFT KNEE - COMPLETE 4+ VIEW COMPARISON:  None Available. FINDINGS: Large knee effusion. Questionable lipohemarthrosis component. Mild irregularity along the medial base of the tibial spine, nondisplaced tibial spine avulsion fracture not excluded. IMPRESSION: 1. Large knee effusion potentially with lipohemarthrosis. This favors internal derangement and potentially occult fracture. There is also some faint linear lucency along the medial base of the tibial spine possibly from nondisplaced tibial spine avulsion fracture (although finding is subtle). Consider CT or MRI for further characterization. Electronically Signed   By: Gaylyn Rong M.D.   On: 04/05/2022 09:31   DG Tibia/Fibula Left  Result Date: 04/05/2022 CLINICAL DATA:  Left leg pain after fall EXAM: LEFT TIBIA AND FIBULA - 2 VIEW COMPARISON:  None Available. FINDINGS: Acute nondisplaced fracture through the lateral aspect of the medial tibial plateau. Osseous structures appear otherwise intact. No additional fractures. No malalignment. Soft tissues within normal limits. IMPRESSION: Acute nondisplaced fracture of the medial tibial plateau. Electronically Signed   By: Duanne Guess D.O.   On: 04/05/2022 09:20   CT Head Wo Contrast  Result Date: 04/05/2022 CLINICAL DATA:  Fall at work with head injury. No loss of consciousness. EXAM: CT HEAD WITHOUT CONTRAST TECHNIQUE: Contiguous axial images were obtained from the base of the skull through the vertex without intravenous contrast. RADIATION DOSE REDUCTION: This exam was performed according to the departmental dose-optimization program which includes automated exposure control, adjustment of the mA and/or kV according to patient size and/or use of iterative reconstruction technique. COMPARISON:  None Available. FINDINGS: Brain: There is no evidence of acute intracranial hemorrhage, mass lesion, brain edema or  extra-axial fluid collection. The ventricles and subarachnoid spaces are appropriately sized for age. There is no CT evidence of acute cortical infarction. Vascular: Mild intracranial atherosclerosis. No hyperdense vessel identified. Skull: Negative for fracture or focal lesion. Sinuses/Orbits: Minimal ethmoid sinus mucosal thickening. The additional paranasal sinuses, mastoid air cells and middle ears are clear. No significant orbital findings. Other: Mild prominence of both parotid glands noted. IMPRESSION: No acute intracranial or calvarial findings. Electronically Signed   By: Carey Bullocks M.D.   On: 04/05/2022 09:03    Procedures Procedures   Medications Ordered in ED Medications  morphine (PF) 4 MG/ML injection 4 mg (4 mg Intravenous Given 04/05/22 0910)  lactated ringers bolus 1,000 mL (0 mLs Intravenous Stopped 04/05/22 1133)  ondansetron (ZOFRAN) injection 4 mg (  4 mg Intravenous Given 04/05/22 0944)  fentaNYL (SUBLIMAZE) injection 50 mcg (50 mcg Intravenous Given 04/05/22 1133)    ED Course/ Medical Decision Making/ A&P                           Medical Decision Making Amount and/or Complexity of Data Reviewed Radiology: ordered.  Risk Prescription drug management.    56 y/o F presents to the ED for evaluation after slip and fall forward. Differential diagnosis includes but is not limited to dislocation, fracture, sprain, strain. Vitals within normal limits. Physical exam as noted above.   Will order XR imaging. Patient is on blood thinner and questions if she hit her head or not. Will order CT of head.   XR of the left tib fib shows acute nondisplaced fracture of the medial tibial plateau. XR of left knee shows large knee effusion potentially with lipohemarthrosis. This favors internal derangement and potentially occult fracture. There is also some faint linear lucency along the medial base of the tibial spine possibly from nondisplaced tibial spine avulsion fracture (although  finding is subtle). Consider CT or MRI for further characterization.  CT of left knee shows  1. Acute nondisplaced fracture through the lateral aspect of the medial tibial plateau without articular surface depression or diastasis. 2. Subtle band-like area of sclerosis extending into the lateral aspect of the proximal tibial metaphysis suggesting a subtle trabecular fracture. 3. Large layering knee joint lipohemarthrosis.   CT of head shows no acute intracranial or calvarial findings.  Consulted Earney Hamburg, PA with Ortho.  He recommended placing her in a knee immobilizer with crutches and not weightbearing and to have her follow-up with Dr. Jena Gauss next week for evaluation.  Neurological exam intact. Pain under control. Patient is safe for discharge.   I checked PDMP and the patient gets consistent Tramadol from her PCP. I discussed this with the patient and she reports that is is for her fibromyalgia. She denies that she is in a pain contract. I discussed with her that if she is under a pain contract, this would be in violation. She verbalizes understanding and reports that she is not under a pain contract. Will send in a few stronger narcotic pain medication. We discussed using Tylenol for pain relief and narcotic pain medication for pain breakthrough. We discussed leaving on the knee immobilizer and using crutches. Discussed that she will need to remain non weight bearing. We discussed strict return precautions and red flag symptoms. The patient verbalized understanding and agrees to the plan.   Final Clinical Impression(s) / ED Diagnoses Final diagnoses:  Fall, initial encounter  Closed fracture of medial portion of left tibial plateau, initial encounter    Rx / DC Orders ED Discharge Orders          Ordered    oxyCODONE (ROXICODONE) 5 MG immediate release tablet  Every 4 hours PRN        04/05/22 1527              Achille Rich, [place] 04/06/22 2339    Tegeler, Canary Brim, MD 04/07/22 1115

## 2022-04-05 NOTE — Discharge Instructions (Addendum)
You were seen in the ER for evaluation after your fall.  It was discovered that you have a fracture to the bone in your leg.  For this, you will need to remain in the leg brace given to you and you will need to use the crutches.  Please do not walk or bear any weight on this leg.  You cannot take any ibuprofen given that you are on a blood thinner.  You can take 1000 mg of Tylenol every 6 hours as needed for pain.  Additionally, I will send you home with a few narcotic pain medications to take if you have any breakthrough pain.  Please make sure you are not taking your tramadol in addition to this.  Please make sure not to drive or operate any heavy machinery as this medication can make you sleepy.  Please make sure you call and schedule an appointment with orthopedic divider listed into this discharge paperwork.  If you have any numbness, tingling, weakness, color changes, temperature changes, increase in pain, swelling, redness, please make sure you return to the Emergency Department immediately.  Contact a health care provider if you: Have pain that does not get better with medicine. Get help right away if: You have severe pain or swelling. You have new pain, swelling, or warmth in your lower leg. Your toes or foot: Are unusually cold. Turn a bluish color. Are numb. You have chest pain. You have difficulty breathing.

## 2022-04-12 ENCOUNTER — Ambulatory Visit (HOSPITAL_COMMUNITY)
Admission: RE | Admit: 2022-04-12 | Discharge: 2022-04-12 | Disposition: A | Discharge status: Home / Self Care | Payer: 59 | Source: Ambulatory Visit | Attending: Nurse Practitioner | Admitting: Nurse Practitioner

## 2022-04-12 DIAGNOSIS — R918 Other nonspecific abnormal finding of lung field: Secondary | ICD-10-CM | POA: Diagnosis present

## 2022-04-19 MED ORDER — NITROGLYCERIN 1 MG/10 ML FOR IR/CATH LAB
INTRA_ARTERIAL | Status: AC
  Filled 2022-04-19: qty 10

## 2022-05-03 ENCOUNTER — Ambulatory Visit (HOSPITAL_COMMUNITY): Payer: 59 | Attending: Orthopedic Surgery | Admitting: Physical Therapy

## 2022-05-03 DIAGNOSIS — M25562 Pain in left knee: Secondary | ICD-10-CM | POA: Insufficient documentation

## 2022-05-03 DIAGNOSIS — M25662 Stiffness of left knee, not elsewhere classified: Secondary | ICD-10-CM | POA: Diagnosis present

## 2022-05-03 NOTE — Therapy (Signed)
OUTPATIENT PHYSICAL THERAPY LOWER EXTREMITY EVALUATION   Patient Name: Carolyn Gaines MRN: [id number] DOB:Mar 11, 1966, 56 y.o., female Today's Date: 05/03/2022   PT End of Session - 05/03/22 1341     Visit Number 1    Number of Visits 12    Date for PT Re-Evaluation 06/14/22    Authorization Type U[employer]    Progress Note Due on Visit 10    PT Start Time 1305    PT Stop Time 1343    PT Time Calculation (min) 38 min    Activity Tolerance Patient tolerated treatment well    Behavior During Therapy WFL for tasks assessed/performed             Past Medical History:  Diagnosis Date   Anemia    low iron   Anxiety    Arthritis    Coronary artery disease    a. s/p prior stenting to RCA b. CABG in 09/2016 with LIMA-LAD, SVG-OM and SVG-RCA c. low-risk NST in 01/2019   Depression    Fibromyalgia    Gallstones    GERD (gastroesophageal reflux disease)    Headache    migraines   MI (myocardial infarction) (HCC) 01/2016   Peptic ulcer    Seizures (HCC)    as a baby   Past Surgical History:  Procedure Laterality Date   ABDOMINAL HYSTERECTOMY     CARDIAC CATHETERIZATION N/A 02/19/2016   Procedure: Left Heart Cath and Coronary Angiography;  Surgeon: Yates Decamp, MD;  Location: [facility] INVASIVE CV LAB;  Service: Cardiovascular;  Laterality: N/A;   CARDIAC CATHETERIZATION N/A 02/19/2016   Procedure: Coronary Stent Intervention;  Surgeon: Yates Decamp, MD;  Location: [facility] INVASIVE CV LAB;  Service: Cardiovascular;  Laterality: N/A;   CESAREAN SECTION     x 2   CHOLECYSTECTOMY N/A 01/25/2017   Procedure: LAPAROSCOPIC CHOLECYSTECTOMY;  Surgeon: Franky Macho, MD;  Location: [facility] ORS;  Service: General;  Laterality: N/A;   COLONOSCOPY     CORONARY ARTERY BYPASS GRAFT N/A 10/26/2016   Procedure: CORONARY ARTERY BYPASS GRAFTING (CABG), ON PUMP, TIMES THREE, USING LEFT INTERNAL MAMMARY ARTERY AND ENDOSCOPICALLY HARVESTED RIGHT GREATER SAPHENOUS VEIN;  Surgeon: Alleen Borne, MD;  Location: [facility] OR;  Service:  Open Heart Surgery;  Laterality: N/A;  LIMA-LAD SVG-RCA SVG-OM   ESOPHAGOGASTRODUODENOSCOPY     LEFT HEART CATH AND CORONARY ANGIOGRAPHY N/A 10/22/2016   Procedure: Left Heart Cath and Coronary Angiography;  Surgeon: Lennette Bihari, MD;  Location: [facility] INVASIVE CV LAB;  Service: Cardiovascular;  Laterality: N/A;   STERNAL WIRES REMOVAL N/A 07/11/2017   Procedure: STERNAL WIRES REMOVAL;  Surgeon: Alleen Borne, MD;  Location: [facility] OR;  Service: Thoracic;  Laterality: N/A;   TEE WITHOUT CARDIOVERSION N/A 10/26/2016   Procedure: TRANSESOPHAGEAL ECHOCARDIOGRAM (TEE);  Surgeon: Alleen Borne, MD;  Location: [facility] OR;  Service: Open Heart Surgery;  Laterality: N/A;   WRIST FRACTURE SURGERY Left    3 times   Patient Active Problem List   Diagnosis Date Noted   Chest pain 11/09/2020   Calculus of gallbladder without cholecystitis without obstruction    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    THERAPY DIAG:  Left knee pain, unspecified chronicity - Plan: PT plan of care cert/re-cert  Stiffness of left knee, not elsewhere classified - Plan: PT plan of care cert/re-cert  Rationale for Evaluation and Treatment Rehabilitation  ONSET DATE: 04/05/22  SUBJECTIVE:   SUBJECTIVE STATEMENT: Patient presents to therapy with complaint of LT knee pain. She slipped and fell last month at work and fractured her LT tibia. She is wearing knee brace. She is still having pain. She was using crutches at first, not as much any more.    PERTINENT HISTORY: Lt tibial plateau fracture   PAIN:  Are you having pain? Yes: NPRS scale: 5-6/10 Pain location: LT knee  Pain description: throbbing  Aggravating factors: walking, WB Relieving factors: non WB, meds   PRECAUTIONS: None  WEIGHT BEARING RESTRICTIONS No  FALLS:  Has patient fallen in last 6 months? Yes. Number of falls 1  LIVING ENVIRONMENT: Lives with: lives with their family Lives in: House/apartment Stairs: Yes: External: 2 steps; bilateral but cannot reach both Has following equipment at home: Crutches  OCCUPATION: Radio broadcast assistant at [employer] Be able to walk and live my normal life again   OBJECTIVE:   DIAGNOSTIC FINDINGS: IMPRESSION: Acute nondisplaced fracture of the medial tibial plateau.    PATIENT SURVEYS:  FOTO 46% function   COGNITION:  Overall cognitive status: Within functional limits for tasks assessed      LOWER EXTREMITY ROM:  Active ROM Right eval Left eval  Hip flexion    Hip extension    Hip abduction    Hip adduction    Hip internal rotation    Hip external rotation    Knee flexion 130 60  Knee extension 0 -3  Ankle dorsiflexion    Ankle plantarflexion    Ankle inversion    Ankle eversion     (Blank rows = not tested)  LOWER EXTREMITY MMT: (unable to lay prone due to pain)   MMT Right eval Left eval  Hip flexion 5 4-  Hip extension    Hip abduction 4+ 3+  Hip adduction    Hip internal rotation    Hip external rotation    Knee flexion    Knee extension 5 4-  Ankle dorsiflexion 5 5  Ankle plantarflexion    Ankle inversion    Ankle eversion     (Blank rows = not tested)   FUNCTIONAL TESTS:  2 minute walk test: 190 feet  GAIT: Distance walked: 190 feet Assistive device utilized: None Level of assistance: Complete Independence Comments: decreased stride, antalgic, Decreased stance on LT     TODAY'S TREATMENT: Eval Quad set Heel slide SLR Ankle pump     PATIENT EDUCATION:  Education details: on eval findings, POC and HEP  Person educated: Patient Education method: Explanation Education comprehension: verbalized understanding   HOME EXERCISE PROGRAM: Access Code: [email or web address] URL: [email or web address] Date: 05/03/2022 Prepared by: Josue Hector  Exercises - Supine Quad Set  - 3 x daily - 7 x weekly - 1-2 sets - 10 reps - 5 second hold - Active Straight Leg Raise with Quad Set  - 3 x daily - 7 x weekly - 1-2 sets - 10 reps - Supine Heel Slide  - 3 x daily - 7 x weekly - 1-2 sets - 10 reps - 5 second hold -  Supine Ankle Pumps  - 3 x daily - 7 x weekly - 1-2 sets - 10 reps  ASSESSMENT:  CLINICAL IMPRESSION: Patient is a 56 y.o. female who presents to physical therapy with complaint of LT knee pain s/p LT tibia fracture. Patient demonstrates muscle weakness, reduced ROM, and fascial restrictions which are likely contributing to symptoms of pain and are negatively impacting patient ability to perform ADLs and functional mobility tasks. Patient will benefit from skilled physical therapy services to address these deficits to reduce pain and improve level of function with ADLs and functional mobility tasks.    OBJECTIVE IMPAIRMENTS Abnormal gait, decreased activity tolerance, decreased balance, decreased mobility, difficulty walking, decreased ROM, decreased strength, increased edema, increased fascial restrictions, impaired flexibility, improper body mechanics, and pain.   ACTIVITY LIMITATIONS standing, squatting, stairs, transfers, and locomotion level  PARTICIPATION LIMITATIONS: cleaning, laundry, driving, shopping, community activity, occupation, and yard work  PERSONAL FACTORS  none  are also affecting patient's functional outcome.   REHAB POTENTIAL: Good  CLINICAL DECISION MAKING: Stable/uncomplicated  EVALUATION COMPLEXITY: Low   GOALS: SHORT TERM GOALS: Target date: 05/24/2022  Patient will be  independent with initial HEP and self-management strategies to improve functional outcomes Baseline:  Goal status: INITIAL    LONG TERM GOALS: Target date: 06/14/2022  Patient will be independent with advanced HEP and self-management strategies to improve functional outcomes Baseline:  Goal status: INITIAL  2.  Patient will improve FOTO score to predicted value to indicate improvement in functional outcomes Baseline: 46% function  Goal status: INITIAL  3.  Patient will have LT knee AROM 0-120 degrees to improve functional mobility and facilitate squatting to pick up items from floor. Baseline: -3 to 60 deg Goal status: INITIAL  4. Patient will have equal to or > 4+/5 MMT throughout LLE to improve ability to perform functional mobility, stair ambulation and ADLs.  Baseline: See MMT Goal status: INITIAL  5. Patient will be able to ambulate at least 325 feet during with LRAD to demonstrate improved ability to perform functional mobility and associated tasks. Baseline: 190 feet Goal status: INITIAL   PLAN:  PT FREQUENCY: 2x/week  PT DURATION: 6 weeks  PLANNED INTERVENTIONS: Therapeutic exercises, Therapeutic activity, Neuromuscular re-education, Balance training, Gait training, Patient/Family education, Joint manipulation, Joint mobilization, Stair training, Aquatic Therapy, Dry Needling, Electrical stimulation, Spinal manipulation, Spinal mobilization, Cryotherapy, Moist heat, scar mobilization, Taping, Traction, Ultrasound, Biofeedback, Ionotophoresis 4mg /ml Dexamethasone, and Manual therapy.  PLAN FOR NEXT SESSION: Progress knee strength and ROM. Gait and balance when ready.   1:43 PM, 05/03/22 07/03/22 PT DPT  Physical Therapist with [facility]  [phone]

## 2022-05-08 ENCOUNTER — Encounter (HOSPITAL_COMMUNITY): Payer: 59 | Admitting: Physical Therapy

## 2022-05-10 ENCOUNTER — Encounter (HOSPITAL_COMMUNITY): Payer: Self-pay | Admitting: Physical Therapy

## 2022-05-10 ENCOUNTER — Ambulatory Visit (HOSPITAL_COMMUNITY): Payer: 59 | Admitting: Physical Therapy

## 2022-05-10 DIAGNOSIS — M25662 Stiffness of left knee, not elsewhere classified: Secondary | ICD-10-CM

## 2022-05-10 DIAGNOSIS — M25562 Pain in left knee: Secondary | ICD-10-CM | POA: Diagnosis not present

## 2022-05-10 NOTE — Therapy (Signed)
OUTPATIENT PHYSICAL THERAPY LOWER EXTREMITY TREATMENT   Patient Name: Carolyn Gaines MRN: [place]:[id number] DOB:04/07/1966, 56 y.o., female Today's Date: 05/10/2022   PT End of Session - 05/10/22 1030     Visit Number 2    Number of Visits 12    Date for PT Re-Evaluation 06/14/22    Authorization Type UHC    Progress Note Due on Visit 10    PT Start Time 1030    PT Stop Time 1110    PT Time Calculation (min) 40 min    Activity Tolerance Patient tolerated treatment well    Behavior During Therapy WFL for tasks assessed/performed             Past Medical History:  Diagnosis Date   Anemia    low iron   Anxiety    Arthritis    Coronary artery disease    a. s/p prior stenting to RCA b. CABG in 09/2016 with LIMA-LAD, SVG-OM and SVG-RCA c. low-risk NST in 01/2019   Depression    Fibromyalgia    Gallstones    GERD (gastroesophageal reflux disease)    Headache    migraines   MI (myocardial infarction) ([place]) 01/2016   Peptic ulcer    Seizures ([place])    as a baby   Past Surgical History:  Procedure Laterality Date   ABDOMINAL HYSTERECTOMY     CARDIAC CATHETERIZATION N/A 02/19/2016   Procedure: Left Heart Cath and Coronary Angiography;  Surgeon: Adrian Prows, MD;  Location: [place] CV LAB;  Service: Cardiovascular;  Laterality: N/A;   CARDIAC CATHETERIZATION N/A 02/19/2016   Procedure: Coronary Stent Intervention;  Surgeon: Adrian Prows, MD;  Location: [place] CV LAB;  Service: Cardiovascular;  Laterality: N/A;   CESAREAN SECTION     x 2   CHOLECYSTECTOMY N/A 01/25/2017   Procedure: LAPAROSCOPIC CHOLECYSTECTOMY;  Surgeon: Aviva Signs, MD;  Location: [facility] ORS;  Service: General;  Laterality: N/A;   COLONOSCOPY     CORONARY ARTERY BYPASS GRAFT N/A 10/26/2016   Procedure: CORONARY ARTERY BYPASS GRAFTING (CABG), ON PUMP, TIMES THREE, USING LEFT INTERNAL MAMMARY ARTERY AND ENDOSCOPICALLY HARVESTED RIGHT GREATER SAPHENOUS VEIN;  Surgeon: Gaye Pollack, MD;  Location: [place];  Service:  Open Heart Surgery;  Laterality: N/A;  LIMA-LAD SVG-RCA SVG-OM   ESOPHAGOGASTRODUODENOSCOPY     LEFT HEART CATH AND CORONARY ANGIOGRAPHY N/A 10/22/2016   Procedure: Left Heart Cath and Coronary Angiography;  Surgeon: Troy Sine, MD;  Location: [place] CV LAB;  Service: Cardiovascular;  Laterality: N/A;   STERNAL WIRES REMOVAL N/A 07/11/2017   Procedure: STERNAL WIRES REMOVAL;  Surgeon: Gaye Pollack, MD;  Location: [place];  Service: Thoracic;  Laterality: N/A;   TEE WITHOUT CARDIOVERSION N/A 10/26/2016   Procedure: TRANSESOPHAGEAL ECHOCARDIOGRAM (TEE);  Surgeon: Gaye Pollack, MD;  Location: [place];  Service: Open Heart Surgery;  Laterality: N/A;   WRIST FRACTURE SURGERY Left    3 times   Patient Active Problem List   Diagnosis Date Noted   Chest pain 11/09/2020   Calculus of gallbladder without cholecystitis without obstruction    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    THERAPY DIAG:  Left knee pain, unspecified chronicity  Stiffness of left knee, not elsewhere classified  Rationale for Evaluation and Treatment Rehabilitation  ONSET DATE: 04/05/22  SUBJECTIVE:   SUBJECTIVE STATEMENT: Just getting over cold. Knee is sore and swollen today. Has been compliant with HEP.   PERTINENT HISTORY: Lt tibial plateau fracture   PAIN:  Are you having pain? Yes: NPRS scale: 5-6/10 Pain location: LT knee  Pain description: throbbing  Aggravating factors: walking, WB Relieving factors: non WB,  meds   PRECAUTIONS: None  WEIGHT BEARING RESTRICTIONS No  FALLS:  Has patient fallen in last 6 months? Yes. 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    PATIENT SURVEYS:  FOTO 46% function   COGNITION:  Overall cognitive status: Within functional limits for tasks assessed      LOWER EXTREMITY ROM:  Active ROM Right eval Left eval  Hip flexion    Hip extension    Hip abduction    Hip adduction    Hip internal rotation    Hip external rotation    Knee flexion 130 60  Knee extension 0 -3  Ankle dorsiflexion    Ankle plantarflexion    Ankle inversion    Ankle eversion     (Blank rows = not tested)  LOWER EXTREMITY MMT: (unable to lay prone due to pain)   MMT Right eval Left eval  Hip flexion 5 4-  Hip extension    Hip abduction 4+ 3+  Hip adduction    Hip internal rotation    Hip external rotation    Knee flexion    Knee extension 5 4-  Ankle dorsiflexion 5 5  Ankle plantarflexion    Ankle inversion    Ankle eversion     (Blank rows = not tested)   FUNCTIONAL TESTS:  2 minute walk test: 190 feet  GAIT: Distance walked: 190 feet Assistive device utilized: None Level of assistance: Complete Independence Comments: decreased stride, antalgic, Decreased stance on LT     TODAY'S TREATMENT: 05/10/22 Quad set 10 x 5"  SLR 2 x 10 Heel slide 10 x 5"  Sidelying leg raise 2 x 10  SAQ 10 x 5"  Seated calf stretch with towel 3 x 10"   Heel raise 2 x 10   Eval Quad set Heel slide SLR Ankle pump    PATIENT EDUCATION:  Education details: on eval  findings, POC and HEP  Person educated: Patient Education method: Explanation Education comprehension: verbalized understanding   HOME EXERCISE PROGRAM: Access Code: [email or web address] URL: [email or web address]  05/10/22 - Sidelying Hip Abduction  - 3 x daily - 7 x weekly - 2 sets - 10 reps - Heel Raises with Counter Support  - 3 x daily - 7 x weekly - 2 sets - 10 reps - Seated Calf Stretch with Strap  - 3 x daily - 7 x weekly - 2 sets - 5 reps - 10 second hold  Date: 05/03/2022 Prepared by: Josue Hector  Exercises - Supine Quad Set  - 3 x daily - 7 x weekly - 1-2 sets - 10 reps - 5 second hold - Active Straight Leg Raise with Quad Set  - 3 x daily - 7 x weekly - 1-2 sets - 10 reps - Supine Heel Slide  - 3 x daily - 7 x weekly - 1-2 sets - 10 reps - 5 second hold - Supine Ankle Pumps  - 3 x daily - 7 x weekly - 1-2 sets - 10 reps  ASSESSMENT:  CLINICAL IMPRESSION: Reviewed goals and HEP. Good return with HEP but notes aching pain in knee that subsides with rest. Encouraged patient to pace activity and avoid elevated pain levels. Progressed LE strengthening on mat and added standing heel raise. Educated patient on purpose and function of all added exercises and issued HEP handout. Patient will continue to benefit from skilled therapy services to reduce remaining deficits and improve functional ability.    OBJECTIVE IMPAIRMENTS Abnormal gait, decreased activity tolerance, decreased balance, decreased mobility, difficulty walking, decreased ROM, decreased strength, increased edema, increased fascial restrictions, impaired flexibility, improper body mechanics, and pain.   ACTIVITY LIMITATIONS standing, squatting, stairs, transfers, and locomotion level  PARTICIPATION LIMITATIONS: cleaning, laundry, driving, shopping, community activity, occupation, and yard work  PERSONAL FACTORS  none  are also affecting patient's functional outcome.   REHAB POTENTIAL: Good  CLINICAL  DECISION MAKING: Stable/uncomplicated  EVALUATION COMPLEXITY: Low   GOALS: SHORT TERM GOALS: Target date: 05/24/2022  Patient will be independent with initial HEP and self-management strategies to improve functional outcomes Baseline:  Goal status: INITIAL    LONG TERM GOALS: Target date: 06/14/2022  Patient will be independent with advanced HEP and self-management strategies to improve functional outcomes Baseline:  Goal status: INITIAL  2.  Patient will improve FOTO score to predicted value to indicate improvement in functional outcomes Baseline: 46% function  Goal status: INITIAL  3.  Patient will have LT knee AROM 0-120 degrees to improve functional mobility and facilitate squatting to pick up items from floor. Baseline: -3 to 60 deg Goal status: INITIAL  4. Patient will have equal to or > 4+/5 MMT throughout LLE to improve ability to perform functional mobility, stair ambulation and ADLs.  Baseline: See MMT Goal status: INITIAL  5. Patient will be able to ambulate at least 325 feet during 2MWT with LRAD to demonstrate improved ability to perform functional mobility and associated tasks. Baseline: 190 feet Goal status: INITIAL   PLAN:  PT FREQUENCY: 2x/week  PT DURATION: 6 weeks  PLANNED INTERVENTIONS: Therapeutic exercises, Therapeutic activity, Neuromuscular re-education, Balance training, Gait training, Patient/Family education, Joint manipulation, Joint mobilization, Stair training, Aquatic Therapy, Dry Needling, Electrical stimulation, Spinal manipulation, Spinal mobilization, Cryotherapy, Moist heat, scar mobilization, Taping, Traction, Ultrasound, Biofeedback, Ionotophoresis 4mg /ml Dexamethasone, and Manual therapy.  PLAN FOR NEXT SESSION: Progress knee strength and ROM. Gait and balance when ready.   10:30 AM, 05/10/22 Josue Hector PT DPT  Physical Therapist with [facility]  [phone]

## 2022-05-17 ENCOUNTER — Ambulatory Visit (HOSPITAL_COMMUNITY): Payer: 59 | Admitting: Physical Therapy

## 2022-05-17 ENCOUNTER — Encounter (HOSPITAL_COMMUNITY): Payer: Self-pay | Admitting: Physical Therapy

## 2022-05-17 DIAGNOSIS — M25562 Pain in left knee: Secondary | ICD-10-CM | POA: Diagnosis not present

## 2022-05-17 DIAGNOSIS — M25662 Stiffness of left knee, not elsewhere classified: Secondary | ICD-10-CM

## 2022-05-17 NOTE — Therapy (Signed)
OUTPATIENT PHYSICAL THERAPY LOWER EXTREMITY TREATMENT   Patient Name: Carolyn Gaines MRN: [id number] DOB:[birth year], 56 y.o., female Today's Date: 05/17/2022   PT End of Session - 05/17/22 1351     Visit Number 3    Number of Visits 12    Date for PT Re-Evaluation 06/14/22    Authorization Type UHC    Progress Note Due on Visit 10    PT Start Time 1352    PT Stop Time 1430    PT Time Calculation (min) 38 min    Activity Tolerance Patient tolerated treatment well    Behavior During Therapy WFL for tasks assessed/performed             Past Medical History:  Diagnosis Date   Anemia    low iron   Anxiety    Arthritis    Coronary artery disease    a. s/p prior stenting to RCA b. CABG in 09/2016 with LIMA-LAD, SVG-OM and SVG-RCA c. low-risk NST in 01/2019   Depression    Fibromyalgia    Gallstones    GERD (gastroesophageal reflux disease)    Headache    migraines   MI (myocardial infarction) (HCC) 01/2016   Peptic ulcer    Seizures (HCC)    as a baby   Past Surgical History:  Procedure Laterality Date   ABDOMINAL HYSTERECTOMY     CARDIAC CATHETERIZATION N/A 02/19/2016   Procedure: Left Heart Cath and Coronary Angiography;  Surgeon: Yates Decamp, MD;  Location: [facility] INVASIVE CV LAB;  Service: Cardiovascular;  Laterality: N/A;   CARDIAC CATHETERIZATION N/A 02/19/2016   Procedure: Coronary Stent Intervention;  Surgeon: Yates Decamp, MD;  Location: [facility] INVASIVE CV LAB;  Service: Cardiovascular;  Laterality: N/A;   CESAREAN SECTION     x 2   CHOLECYSTECTOMY N/A 01/25/2017   Procedure: LAPAROSCOPIC CHOLECYSTECTOMY;  Surgeon: Franky Macho, MD;  Location: [facility] ORS;  Service: General;  Laterality: N/A;   COLONOSCOPY     CORONARY ARTERY BYPASS GRAFT N/A 10/26/2016   Procedure: CORONARY ARTERY BYPASS GRAFTING (CABG), ON PUMP, TIMES THREE, USING LEFT INTERNAL MAMMARY ARTERY AND ENDOSCOPICALLY HARVESTED RIGHT GREATER SAPHENOUS VEIN;  Surgeon: Alleen Borne, MD;  Location: [facility] OR;  Service:  Open Heart Surgery;  Laterality: N/A;  LIMA-LAD SVG-RCA SVG-OM   ESOPHAGOGASTRODUODENOSCOPY     LEFT HEART CATH AND CORONARY ANGIOGRAPHY N/A 10/22/2016   Procedure: Left Heart Cath and Coronary Angiography;  Surgeon: Lennette Bihari, MD;  Location: [facility] INVASIVE CV LAB;  Service: Cardiovascular;  Laterality: N/A;   STERNAL WIRES REMOVAL N/A 07/11/2017   Procedure: STERNAL WIRES REMOVAL;  Surgeon: Alleen Borne, MD;  Location: [facility] OR;  Service: Thoracic;  Laterality: N/A;   TEE WITHOUT CARDIOVERSION N/A 10/26/2016   Procedure: TRANSESOPHAGEAL ECHOCARDIOGRAM (TEE);  Surgeon: Alleen Borne, MD;  Location: [facility] OR;  Service: Open Heart Surgery;  Laterality: N/A;   WRIST FRACTURE SURGERY Left    3 times   Patient Active Problem List   Diagnosis Date Noted   Chest pain 11/09/2020   Calculus of gallbladder without cholecystitis without obstruction    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    THERAPY DIAG:  Left knee pain, unspecified chronicity  Stiffness of left knee, not elsewhere classified  Rationale for Evaluation and Treatment Rehabilitation  ONSET DATE: 04/05/22  SUBJECTIVE:   SUBJECTIVE STATEMENT: Patient states soreness in the knee. Mostly at night and in the morning. She cant sleep comfortably. She has been overdoing it with trying to get her knee stronger.    PERTINENT HISTORY: Lt tibial plateau fracture   PAIN:  Are you having pain? Yes: NPRS scale: 5/10 Pain location: LT knee  Pain  description: Sore  Aggravating factors: walking, WB Relieving factors: non WB, meds   PRECAUTIONS: None  WEIGHT BEARING RESTRICTIONS No  FALLS:  Has patient fallen in last 6 months? Yes. Number of falls 1  LIVING ENVIRONMENT: Lives with: lives with their family Lives in: House/apartment Stairs: Yes: External: 2 steps; bilateral but cannot reach both Has following equipment at home: Crutches  OCCUPATION: Radio broadcast assistant at [employer] Be able to walk and live my normal life again   OBJECTIVE:   DIAGNOSTIC FINDINGS: IMPRESSION: Acute nondisplaced fracture of the medial tibial plateau.    PATIENT SURVEYS:  FOTO 46% function   COGNITION:  Overall cognitive status: Within functional limits for tasks assessed      LOWER EXTREMITY ROM:  Active ROM Right eval Left eval Left 05/17/22  Hip flexion     Hip extension     Hip abduction     Hip adduction     Hip internal rotation     Hip external rotation     Knee flexion 130 60 AAROM 113  Knee extension 0 -3 0  Ankle dorsiflexion     Ankle plantarflexion     Ankle inversion     Ankle eversion      (Blank rows = not tested)  LOWER EXTREMITY MMT: (unable to lay prone due to pain)   MMT Right eval Left eval  Hip flexion 5 4-  Hip extension    Hip abduction 4+ 3+  Hip adduction    Hip internal rotation    Hip external rotation    Knee flexion    Knee extension 5 4-  Ankle dorsiflexion 5 5  Ankle plantarflexion    Ankle inversion    Ankle eversion     (Blank rows = not tested)   FUNCTIONAL TESTS:  2 minute walk test: 190 feet  GAIT: Distance walked: 190 feet Assistive device utilized: None Level of assistance: Complete Independence Comments: decreased stride, antalgic, Decreased stance on LT     TODAY'S TREATMENT: 05/17/22 Quad Set 5 x 10 second holds Heel slides 10 x 10 second holds with strap SLR 2 x 10 HR 2 x 10  TR 2 x 10  Mini squat 1x 10  LAQ 1x 10  5 second holds  05/10/22 Quad set 10 x 5"  SLR 2 x 10 Heel slide 10 x 5"  Sidelying leg raise 2 x 10  SAQ 10 x 5"  Seated calf stretch with towel 3 x 10"   Heel raise 2 x 10   Eval Quad set Heel slide SLR Ankle pump    PATIENT EDUCATION:  Education details: 10/19/23HEP; EVAL: on eval findings, POC and HEP  Person educated: Patient Education method: Explanation Education comprehension: verbalized understanding   HOME EXERCISE PROGRAM: Access Code: [email or web address] URL: [email or web address]  05/17/22- Mini Squat with Counter Support  - 1 x daily - 7 x weekly - 2 sets - 10 reps  05/10/22 - Sidelying Hip Abduction  - 3 x daily - 7 x weekly - 2 sets - 10 reps - Heel Raises with Counter Support  - 3 x daily - 7 x weekly - 2 sets - 10 reps - Seated Calf Stretch with Strap  - 3 x daily - 7 x weekly - 2 sets - 5 reps - 10 second hold  Date: 05/03/2022 Prepared by: Georges Lynch  Exercises - Supine Quad Set  - 3 x daily - 7 x weekly - 1-2 sets - 10 reps - 5 second hold - Active Straight Leg Raise with Quad Set  - 3 x daily - 7 x weekly - 1-2 sets - 10 reps - Supine Heel Slide  - 3 x daily - 7 x weekly - 1-2 sets - 10 reps - 5 second hold - Supine Ankle Pumps  - 3 x daily - 7 x weekly - 1-2 sets - 10 reps  ASSESSMENT:  CLINICAL IMPRESSION: Patient demonstrating improving ROM from 0 to 113 with heel slides. Continued with supine strength and mobility exercises which are tolerated well. Patient highly motivated to improve function as she is going back to work soon. Patient requires tactile cueing for mini squat mechanics with fair carry over. Patient will continue to benefit from physical therapy in order to improve function and reduce impairment.    OBJECTIVE IMPAIRMENTS Abnormal gait, decreased activity tolerance, decreased balance, decreased mobility, difficulty walking, decreased ROM, decreased strength, increased edema, increased fascial restrictions, impaired  flexibility, improper body mechanics, and pain.   ACTIVITY LIMITATIONS standing, squatting, stairs, transfers, and locomotion level  PARTICIPATION LIMITATIONS: cleaning, laundry, driving, shopping, community activity, occupation, and yard work  PERSONAL FACTORS  none  are also affecting patient's functional outcome.   REHAB POTENTIAL: Good  CLINICAL DECISION MAKING: Stable/uncomplicated  EVALUATION COMPLEXITY: Low   GOALS: SHORT TERM GOALS: Target date: 05/24/2022  Patient will be independent with initial HEP and self-management strategies to improve functional outcomes Baseline:  Goal status: INITIAL    LONG TERM GOALS: Target date: 06/14/2022  Patient will be independent with advanced HEP and self-management strategies to improve functional outcomes Baseline:  Goal status: INITIAL  2.  Patient will improve FOTO score to predicted value to indicate improvement in functional outcomes Baseline: 46% function  Goal status: INITIAL  3.  Patient will have LT knee AROM 0-120 degrees to improve functional mobility and facilitate squatting to pick up items from floor. Baseline: -3 to 60 deg Goal status: INITIAL  4. Patient will have equal to or > 4+/5 MMT throughout LLE to improve ability to perform functional mobility, stair ambulation and ADLs.  Baseline: See MMT Goal status: INITIAL  5. Patient will be able to ambulate at least 325 feet during with LRAD to demonstrate improved ability to perform functional mobility and associated tasks. Baseline: 190 feet Goal status: INITIAL   PLAN:  PT FREQUENCY: 2x/week  PT DURATION: 6 weeks  PLANNED INTERVENTIONS: Therapeutic exercises, Therapeutic activity, Neuromuscular re-education, Balance training, Gait training, Patient/Family education, Joint manipulation, Joint mobilization, Stair training, Aquatic Therapy, Dry Needling, Electrical stimulation, Spinal manipulation, Spinal mobilization, Cryotherapy, Moist heat, scar  mobilization, Taping, Traction, Ultrasound, Biofeedback, Ionotophoresis 4mg /ml Dexamethasone, and Manual therapy.  PLAN FOR NEXT SESSION: Progress knee strength and ROM. Gait and balance when ready.   1:52 PM, 05/17/22 05/19/22 PT, DPT Physical  Therapist at [facility]

## 2022-05-23 ENCOUNTER — Encounter (HOSPITAL_COMMUNITY): Payer: Self-pay

## 2022-05-23 ENCOUNTER — Ambulatory Visit (HOSPITAL_COMMUNITY): Payer: 59

## 2022-05-23 DIAGNOSIS — M25562 Pain in left knee: Secondary | ICD-10-CM

## 2022-05-23 DIAGNOSIS — M25662 Stiffness of left knee, not elsewhere classified: Secondary | ICD-10-CM

## 2022-05-23 NOTE — Therapy (Signed)
OUTPATIENT PHYSICAL THERAPY LOWER EXTREMITY TREATMENT   Patient Name: Carolyn Gaines MRN: [id number] DOB:1966-07-17, 56 y.o., female Today's Date: 05/23/2022   PT End of Session - 05/23/22 1029     Visit Number 4    Number of Visits 12    Date for PT Re-Evaluation 06/14/22    Authorization Type UHC    Progress Note Due on Visit 10    PT Start Time [phone]    PT Stop Time 1030    PT Time Calculation (min) 38 min    Activity Tolerance Patient limited by pain;No increased pain;Patient tolerated treatment well    Behavior During Therapy [facility] for tasks assessed/performed              Past Medical History:  Diagnosis Date   Anemia    low iron   Anxiety    Arthritis    Coronary artery disease    a. s/p prior stenting to RCA b. CABG in 09/2016 with LIMA-LAD, SVG-OM and SVG-RCA c. low-risk NST in 01/2019   Depression    Fibromyalgia    Gallstones    GERD (gastroesophageal reflux disease)    Headache    migraines   MI (myocardial infarction) (HCC) 01/2016   Peptic ulcer    Seizures (HCC)    as a baby   Past Surgical History:  Procedure Laterality Date   ABDOMINAL HYSTERECTOMY     CARDIAC CATHETERIZATION N/A 02/19/2016   Procedure: Left Heart Cath and Coronary Angiography;  Surgeon: Yates Decamp, MD;  Location: [facility] INVASIVE CV LAB;  Service: Cardiovascular;  Laterality: N/A;   CARDIAC CATHETERIZATION N/A 02/19/2016   Procedure: Coronary Stent Intervention;  Surgeon: Yates Decamp, MD;  Location: [facility] INVASIVE CV LAB;  Service: Cardiovascular;  Laterality: N/A;   CESAREAN SECTION     x 2   CHOLECYSTECTOMY N/A 01/25/2017   Procedure: LAPAROSCOPIC CHOLECYSTECTOMY;  Surgeon: Franky Macho, MD;  Location: [facility] ORS;  Service: General;  Laterality: N/A;   COLONOSCOPY     CORONARY ARTERY BYPASS GRAFT N/A 10/26/2016   Procedure: CORONARY ARTERY BYPASS GRAFTING (CABG), ON PUMP, TIMES THREE, USING LEFT INTERNAL MAMMARY ARTERY AND ENDOSCOPICALLY HARVESTED RIGHT GREATER SAPHENOUS VEIN;  Surgeon:  Alleen Borne, MD;  Location: [facility] OR;  Service: Open Heart Surgery;  Laterality: N/A;  LIMA-LAD SVG-RCA SVG-OM   ESOPHAGOGASTRODUODENOSCOPY     LEFT HEART CATH AND CORONARY ANGIOGRAPHY N/A 10/22/2016   Procedure: Left Heart Cath and Coronary Angiography;  Surgeon: Lennette Bihari, MD;  Location: [facility] INVASIVE CV LAB;  Service: Cardiovascular;  Laterality: N/A;   STERNAL WIRES REMOVAL N/A 07/11/2017   Procedure: STERNAL WIRES REMOVAL;  Surgeon: Alleen Borne, MD;  Location: [facility] OR;  Service: Thoracic;  Laterality: N/A;   TEE WITHOUT CARDIOVERSION N/A 10/26/2016   Procedure: TRANSESOPHAGEAL ECHOCARDIOGRAM (TEE);  Surgeon: Alleen Borne, MD;  Location: [facility] OR;  Service: Open Heart Surgery;  Laterality: N/A;   WRIST FRACTURE SURGERY Left    3 times   Patient Active Problem List   Diagnosis Date Noted   Chest pain 11/09/2020   Calculus of gallbladder without cholecystitis without obstruction    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    THERAPY DIAG:  Left knee pain, unspecified chronicity  Stiffness of left knee, not elsewhere classified  Rationale for Evaluation and Treatment Rehabilitation  ONSET DATE: 04/05/22  SUBJECTIVE:   SUBJECTIVE STATEMENT: Pt stated she walked into hospital and rode elevator with increased pain at entrance today.  Reports she has increased sharp chest pain, MD aware.  She has been overdoing it and returns to work with no restrictions on 06/07/22.  PERTINENT HISTORY: Lt tibial  plateau fracture   PAIN:  Are you having pain? Yes: NPRS scale: 6-7/10 Pain location: LT knee  Pain description: Sore  Aggravating factors: walking, WB Relieving factors: non WB, meds   PRECAUTIONS: None  WEIGHT BEARING RESTRICTIONS No  FALLS:  Has patient fallen in last 6 months? Yes. Number of falls 1  LIVING ENVIRONMENT: Lives with: lives with their family Lives in: House/apartment Stairs: Yes: External: 2 steps; bilateral but cannot reach both Has following equipment at home: Crutches  OCCUPATION: International aid/development worker at [employer]   PLOF: Independent  PATIENT GOALS Be able to walk and live my normal life again   OBJECTIVE:   DIAGNOSTIC FINDINGS: IMPRESSION: Acute nondisplaced fracture of the medial tibial plateau.    PATIENT SURVEYS:  FOTO 46% function   COGNITION:  Overall cognitive status: Within functional limits for tasks assessed      LOWER EXTREMITY ROM:  Active ROM Right eval Left eval Left 05/17/22  Hip flexion     Hip extension     Hip abduction     Hip adduction     Hip internal rotation     Hip external rotation     Knee flexion 130 60 AAROM 113  Knee extension 0 -3 0  Ankle dorsiflexion     Ankle plantarflexion     Ankle inversion     Ankle eversion      (Blank rows = not tested)  LOWER EXTREMITY MMT: (unable to lay prone due to pain)   MMT Right eval Left eval  Hip flexion 5 4-  Hip extension    Hip abduction 4+ 3+  Hip adduction    Hip internal rotation    Hip external rotation    Knee flexion    Knee extension 5 4-  Ankle dorsiflexion 5 5  Ankle plantarflexion    Ankle inversion    Ankle eversion     (Blank rows = not tested)   FUNCTIONAL TESTS:  2 minute walk test: 190 feet  GAIT: Distance walked: 190 feet Assistive device utilized: None Level of assistance: Complete Independence Comments: decreased stride, antalgic, Decreased stance on LT     TODAY'S TREATMENT: 05/23/22 Vitals following reports of SOB  initially O2 sat 93% and HR at 96 Diaphragmatic breathing x 1 min, improved O2 sat 96% and reduced HR to 80 Knee drive 5x 10" on 8in step height for flexion Manual patella and tib/fib mobs all directions Sidelying quad stretch 3x 30" with rope AROM 0-123 degrees Standing: squats 2x 10 cueing for mechanics for increased gluteal activation 4in lateral step up 15x 1 HR Forward step down 4 then 6in 10x 1 HR    05/17/22 Quad Set 5 x 10 second holds Heel slides 10 x 10 second holds with strap SLR 2 x 10 HR 2 x 10  TR 2 x 10  Mini squat 1x 10  LAQ  1x 10 5 second holds  05/10/22 Quad set 10 x 5"  SLR 2 x 10 Heel slide 10 x 5"  Sidelying leg raise 2 x 10  SAQ 10 x 5"  Seated calf stretch with towel 3 x 10"   Heel raise 2 x 10   Eval Quad set Heel slide SLR Ankle pump    PATIENT EDUCATION:  Education details: 10/19/23HEP; EVAL: on eval findings, POC and HEP  Person educated: Patient Education method: Explanation Education comprehension: verbalized understanding   HOME EXERCISE PROGRAM: Access Code: [email or web address] URL: [email or web address]  05/17/22- Mini Squat with Counter Support  - 1 x daily - 7 x weekly - 2 sets - 10 reps  05/10/22 - Sidelying Hip Abduction  - 3 x daily - 7 x weekly - 2 sets - 10 reps - Heel Raises with Counter Support  - 3 x daily - 7 x weekly - 2 sets - 10 reps - Seated Calf Stretch with Strap  - 3 x daily - 7 x weekly - 2 sets - 5 reps - 10 second hold  Date: 05/03/2022 Prepared by: Georges Lynch  Exercises - Supine Quad Set  - 3 x daily - 7 x weekly - 1-2 sets - 10 reps - 5 second hold - Active Straight Leg Raise with Quad Set  - 3 x daily - 7 x weekly - 1-2 sets - 10 reps - Supine Heel Slide  - 3 x daily - 7 x weekly - 1-2 sets - 10 reps - 5 second hold - Supine Ankle Pumps  - 3 x daily - 7 x weekly - 1-2 sets - 10 reps  ASSESSMENT:  CLINICAL IMPRESSION: Began session with diaghragmatic breathing to address vitals and  relax CNS following reports of increased pain walking into dept today.  Knee mobility exercises added with imporved AROM 0-123 degrees and reports of pain reduced to 5/10 from 7/10 initial session.  Educated on proper mechanics for squats for increased gluteal activation to reduce stress on anterior aspect of knee.  Progressed quad strengthening with additional lateral and forward step down training.  Pt reports she wishes to RTW 06/07/22 and needs to be able to get down on ground and back up.     OBJECTIVE IMPAIRMENTS Abnormal gait, decreased activity tolerance, decreased balance, decreased mobility, difficulty walking, decreased ROM, decreased strength, increased edema, increased fascial restrictions, impaired flexibility, improper body mechanics, and pain.   ACTIVITY LIMITATIONS standing, squatting, stairs, transfers, and locomotion level  PARTICIPATION LIMITATIONS: cleaning, laundry, driving, shopping, community activity, occupation, and yard work  PERSONAL FACTORS  none  are also affecting patient's functional outcome.   REHAB POTENTIAL: Good  CLINICAL DECISION MAKING: Stable/uncomplicated  EVALUATION COMPLEXITY: Low   GOALS: SHORT TERM GOALS: Target date: 05/24/2022  Patient will be independent with initial HEP and self-management strategies to improve functional outcomes Baseline:  Goal status: INITIAL    LONG TERM GOALS: Target date: 06/14/2022  Patient will be independent with advanced HEP and self-management strategies to improve functional outcomes Baseline:  Goal status: INITIAL  2.  Patient will improve FOTO score to predicted value to indicate improvement in functional outcomes Baseline: 46% function  Goal status: INITIAL  3.  Patient will have LT knee AROM 0-120 degrees to improve functional mobility and facilitate squatting to pick up items from floor. Baseline: -3 to 60 deg; 05/23/22 0-123 degrees Goal status: INITIAL  4. Patient will have equal to or > 4+/5  MMT throughout LLE to improve ability to  perform functional mobility, stair ambulation and ADLs.  Baseline: See MMT Goal status: INITIAL  5. Patient will be able to ambulate at least 325 feet during 2MWT with LRAD to demonstrate improved ability to perform functional mobility and associated tasks. Baseline: 190 feet Goal status: INITIAL   PLAN:  PT FREQUENCY: 2x/week  PT DURATION: 6 weeks  PLANNED INTERVENTIONS: Therapeutic exercises, Therapeutic activity, Neuromuscular re-education, Balance training, Gait training, Patient/Family education, Joint manipulation, Joint mobilization, Stair training, Aquatic Therapy, Dry Needling, Electrical stimulation, Spinal manipulation, Spinal mobilization, Cryotherapy, Moist heat, scar mobilization, Taping, Traction, Ultrasound, Biofeedback, Ionotophoresis 4mg /ml Dexamethasone, and Manual therapy.  PLAN FOR NEXT SESSION: Progress knee strength and ROM. Gait and balance when ready.  Progress functional strengthening to improve lunges and floor to standing as able.  Wishes to RTW 06/07/22.   Ihor Austin, LPTA/CLT; CBIS [phone]  11:38 AM, 05/23/22

## 2022-05-25 ENCOUNTER — Ambulatory Visit (HOSPITAL_COMMUNITY): Payer: 59

## 2022-05-25 ENCOUNTER — Encounter (HOSPITAL_COMMUNITY): Payer: Self-pay

## 2022-05-25 DIAGNOSIS — M25562 Pain in left knee: Secondary | ICD-10-CM

## 2022-05-25 DIAGNOSIS — M25662 Stiffness of left knee, not elsewhere classified: Secondary | ICD-10-CM

## 2022-05-25 NOTE — Therapy (Signed)
OUTPATIENT PHYSICAL THERAPY LOWER EXTREMITY TREATMENT   Patient Name: Carolyn Gaines MRN: [id number] DOB:[birth year], 56 y.o., female Today's Date: 05/25/2022   PT End of Session - 05/25/22 1036     Visit Number 5    Number of Visits 12    Date for PT Re-Evaluation 06/14/22    Authorization Type UHC    Progress Note Due on Visit 10    PT Start Time [phone]    PT Stop Time 1028    PT Time Calculation (min) 40 min    Activity Tolerance Patient tolerated treatment well    Behavior During Therapy [facility] for tasks assessed/performed               Past Medical History:  Diagnosis Date   Anemia    low iron   Anxiety    Arthritis    Coronary artery disease    a. s/p prior stenting to RCA b. CABG in 09/2016 with LIMA-LAD, SVG-OM and SVG-RCA c. low-risk NST in 01/2019   Depression    Fibromyalgia    Gallstones    GERD (gastroesophageal reflux disease)    Headache    migraines   MI (myocardial infarction) (HCC) 01/2016   Peptic ulcer    Seizures (HCC)    as a baby   Past Surgical History:  Procedure Laterality Date   ABDOMINAL HYSTERECTOMY     CARDIAC CATHETERIZATION N/A 02/19/2016   Procedure: Left Heart Cath and Coronary Angiography;  Surgeon: Yates Decamp, MD;  Location: [facility] INVASIVE CV LAB;  Service: Cardiovascular;  Laterality: N/A;   CARDIAC CATHETERIZATION N/A 02/19/2016   Procedure: Coronary Stent Intervention;  Surgeon: Yates Decamp, MD;  Location: [facility] INVASIVE CV LAB;  Service: Cardiovascular;  Laterality: N/A;   CESAREAN SECTION     x 2   CHOLECYSTECTOMY N/A 01/25/2017   Procedure: LAPAROSCOPIC CHOLECYSTECTOMY;  Surgeon: Franky Macho, MD;  Location: [facility] ORS;  Service: General;  Laterality: N/A;   COLONOSCOPY     CORONARY ARTERY BYPASS GRAFT N/A 10/26/2016   Procedure: CORONARY ARTERY BYPASS GRAFTING (CABG), ON PUMP, TIMES THREE, USING LEFT INTERNAL MAMMARY ARTERY AND ENDOSCOPICALLY HARVESTED RIGHT GREATER SAPHENOUS VEIN;  Surgeon: Alleen Borne, MD;  Location: [facility] OR;   Service: Open Heart Surgery;  Laterality: N/A;  LIMA-LAD SVG-RCA SVG-OM   ESOPHAGOGASTRODUODENOSCOPY     LEFT HEART CATH AND CORONARY ANGIOGRAPHY N/A 10/22/2016   Procedure: Left Heart Cath and Coronary Angiography;  Surgeon: Lennette Bihari, MD;  Location: [facility] INVASIVE CV LAB;  Service: Cardiovascular;  Laterality: N/A;   STERNAL WIRES REMOVAL N/A 07/11/2017   Procedure: STERNAL WIRES REMOVAL;  Surgeon: Alleen Borne, MD;  Location: [facility] OR;  Service: Thoracic;  Laterality: N/A;   TEE WITHOUT CARDIOVERSION N/A 10/26/2016   Procedure: TRANSESOPHAGEAL ECHOCARDIOGRAM (TEE);  Surgeon: Alleen Borne, MD;  Location: [facility] OR;  Service: Open Heart Surgery;  Laterality: N/A;   WRIST FRACTURE SURGERY Left    3 times   Patient Active Problem List   Diagnosis Date Noted   Chest pain 11/09/2020   Calculus of gallbladder without cholecystitis without obstruction    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    THERAPY DIAG:  Left knee pain, unspecified chronicity  Stiffness of left knee, not elsewhere classified  Rationale for Evaluation and Treatment Rehabilitation  ONSET DATE: 04/05/22  SUBJECTIVE:   SUBJECTIVE STATEMENT: Reports she feels better today, was a little sore following last session.  No reports of chest pain or SOB at beginning of session.  Has been walking more at home.    PERTINENT HISTORY: Lt tibial plateau fracture   PAIN:  Are you having pain? Yes: NPRS scale: 4/10 Pain location: LT knee  Pain  description: Sore  Aggravating factors: walking, WB Relieving factors: non WB, meds   PRECAUTIONS: None  WEIGHT BEARING RESTRICTIONS No  FALLS:  Has patient fallen in last 6 months? Yes. Number of falls 1  LIVING ENVIRONMENT: Lives with: lives with their family Lives in: House/apartment Stairs: Yes: External: 2 steps; bilateral but cannot reach both Has following equipment at home: Crutches  OCCUPATION: International aid/development worker at [employer]   PLOF: Independent  PATIENT GOALS Be able to walk and live my normal life again   OBJECTIVE:   DIAGNOSTIC FINDINGS: IMPRESSION: Acute nondisplaced fracture of the medial tibial plateau.    PATIENT SURVEYS:  FOTO 46% function   COGNITION:  Overall cognitive status: Within functional limits for tasks assessed      LOWER EXTREMITY ROM:  Active ROM Right eval Left eval Left 05/17/22 Left 05/21/22  Hip flexion      Hip extension      Hip abduction      Hip adduction      Hip internal rotation      Hip external rotation      Knee flexion 130 60 AAROM 113 123  Knee extension 0 -3 0 0  Ankle dorsiflexion      Ankle plantarflexion      Ankle inversion      Ankle eversion       (Blank rows = not tested)  LOWER EXTREMITY MMT: (unable to lay prone due to pain)   MMT Right eval Left eval  Hip flexion 5 4-  Hip extension    Hip abduction 4+ 3+  Hip adduction    Hip internal rotation    Hip external rotation    Knee flexion    Knee extension 5 4-  Ankle dorsiflexion 5 5  Ankle plantarflexion    Ankle inversion    Ankle eversion     (Blank rows = not tested)   FUNCTIONAL TESTS:  2 minute walk test: 190 feet  GAIT: Distance walked: 190 feet Assistive device utilized: None Level of assistance: Complete Independence Comments: decreased stride, antalgic, Decreased stance on LT     TODAY'S TREATMENT: 05/25/22: Squat then heel raise 10x  Lateral step up 6in 15x Forward lunge BLE minimal HHA 15x 6in 2RT stairs 7in  with 1HR reciprocal pattern Vector stance 3x 5" SLS Rt 16", LT 6"  05/23/22 Vitals following reports of SOB initially O2 sat 93% and HR at 96 Diaphragmatic breathing x 1 min, improved O2 sat 96% and reduced HR to 80 Knee drive 5x 10" on 8in step height for flexion Manual patella and tib/fib mobs all directions Sidelying quad stretch 3x 30" with rope AROM 0-123 degrees Standing: squats 2x 10 cueing for mechanics for increased gluteal activation 4in lateral step up 15x 1 HR Forward step down 4 then 6in 10x  1 HR    05/17/22 Quad Set 5 x 10 second holds Heel slides 10 x 10 second holds with strap SLR 2 x 10 HR 2 x 10  TR 2 x 10  Mini squat 1x 10  LAQ 1x 10 5 second holds  05/10/22 Quad set 10 x 5"  SLR 2 x 10 Heel slide 10 x 5"  Sidelying leg raise 2 x 10  SAQ 10 x 5"  Seated calf stretch with towel 3 x 10"   Heel raise 2 x 10   Eval Quad set Heel slide SLR Ankle pump    PATIENT EDUCATION:  Education details: 10/19/23HEP; EVAL: on eval findings, POC and HEP  Person educated: Patient Education method: Explanation Education comprehension: verbalized understanding   HOME EXERCISE PROGRAM: Access Code: [email or web address] URL: [email or web address]  05/25/22:  Squat, SLS and vector stance  05/17/22- Mini Squat with Counter Support  - 1 x daily - 7 x weekly - 2 sets - 10 reps  05/10/22 - Sidelying Hip Abduction  - 3 x daily - 7 x weekly - 2 sets - 10 reps - Heel Raises with Counter Support  - 3 x daily - 7 x weekly - 2 sets - 10 reps - Seated Calf Stretch with Strap  - 3 x daily - 7 x weekly - 2 sets - 5 reps - 10 second hold  Date: 05/03/2022 Prepared by: Josue Hector  Exercises - Supine Quad Set  - 3 x daily - 7 x weekly - 1-2 sets - 10 reps - 5 second hold - Active Straight Leg Raise with Quad Set  - 3 x daily - 7 x weekly - 1-2 sets - 10 reps - Supine Heel Slide  - 3 x daily - 7 x weekly - 1-2 sets - 10 reps - 5 second hold - Supine Ankle Pumps   - 3 x daily - 7 x weekly - 1-2 sets - 10 reps  ASSESSMENT:  CLINICAL IMPRESSION: Pt progressing well towards POC.  Began coordination activities to simulate RTW activities, presents with improved mechanics with squats and good stability with heel raises.  Able to increased height with lateral step ups with good quad control, noted visible fatigue.  Did discuss adding walking program to improve activity tolerance.  Added hip stability exercises, forward lunges and reciprocal pattern on 7in step height.  Main difficulty noted with eccentric control stepping down stairs.  Added squats, SLS and vector stance to HEP.  OBJECTIVE IMPAIRMENTS Abnormal gait, decreased activity tolerance, decreased balance, decreased mobility, difficulty walking, decreased ROM, decreased strength, increased edema, increased fascial restrictions, impaired flexibility, improper body mechanics, and pain.   ACTIVITY LIMITATIONS standing, squatting, stairs, transfers, and locomotion level  PARTICIPATION LIMITATIONS: cleaning, laundry, driving, shopping, community activity, occupation, and yard work  PERSONAL FACTORS  none  are also affecting patient's functional outcome.   REHAB POTENTIAL: Good  CLINICAL DECISION MAKING: Stable/uncomplicated  EVALUATION COMPLEXITY: Low   GOALS: SHORT TERM GOALS: Target date: 05/24/2022  Patient will be independent with initial HEP and self-management strategies to improve functional outcomes Baseline:  Goal status: IN PROGRESS    LONG TERM GOALS: Target date: 06/14/2022  Patient will be independent with advanced HEP and self-management strategies to improve functional outcomes Baseline:  Goal status: IN PROGRESS  2.  Patient will improve FOTO score to predicted value to indicate improvement in functional outcomes Baseline: 46% function  Goal status: IN PROGRESS  3.  Patient will have LT knee AROM 0-120 degrees to  improve functional mobility and facilitate squatting to pick  up items from floor. Baseline: -3 to 60 deg; 05/23/22 0-123 degrees Goal status: IN PROGRESS  4. Patient will have equal to or > 4+/5 MMT throughout LLE to improve ability to perform functional mobility, stair ambulation and ADLs.  Baseline: See MMT Goal status: IN PROGRESS  5. Patient will be able to ambulate at least 325 feet during with LRAD to demonstrate improved ability to perform functional mobility and associated tasks. Baseline: 190 feet Goal status: IN PROGRESS   PLAN:  PT FREQUENCY: 2x/week  PT DURATION: 6 weeks  PLANNED INTERVENTIONS: Therapeutic exercises, Therapeutic activity, Neuromuscular re-education, Balance training, Gait training, Patient/Family education, Joint manipulation, Joint mobilization, Stair training, Aquatic Therapy, Dry Needling, Electrical stimulation, Spinal manipulation, Spinal mobilization, Cryotherapy, Moist heat, scar mobilization, Taping, Traction, Ultrasound, Biofeedback, Ionotophoresis 4mg /ml Dexamethasone, and Manual therapy.  PLAN FOR NEXT SESSION: Progress knee strength and ROM. Gait and balance when ready.  Progress functional strengthening to improve lunges (decrease step height next session or begin on floor) and floor to standing as able.  Wishes to RTW 06/07/22.   13/9/23, LPTA/CLT; CBIS [phone]  10:37 AM, 05/25/22

## 2022-05-28 ENCOUNTER — Ambulatory Visit (HOSPITAL_COMMUNITY): Payer: 59 | Admitting: Physical Therapy

## 2022-05-28 ENCOUNTER — Encounter (HOSPITAL_COMMUNITY): Payer: Self-pay | Admitting: Physical Therapy

## 2022-05-28 DIAGNOSIS — M25562 Pain in left knee: Secondary | ICD-10-CM | POA: Diagnosis not present

## 2022-05-28 DIAGNOSIS — M25662 Stiffness of left knee, not elsewhere classified: Secondary | ICD-10-CM

## 2022-05-28 NOTE — Therapy (Signed)
OUTPATIENT PHYSICAL THERAPY LOWER EXTREMITY TREATMENT   Patient Name: Carolyn Gaines MRN: [id number] DOB:01/28/1966, 56 y.o., female Today's Date: 05/28/2022   PT End of Session - 05/28/22 1435     Visit Number 6    Number of Visits 12    Date for PT Re-Evaluation 06/14/22    Authorization Type UHC    Progress Note Due on Visit 10    PT Start Time 1432    PT Stop Time 1521    PT Time Calculation (min) 49 min    Activity Tolerance Patient tolerated treatment well    Behavior During Therapy WFL for tasks assessed/performed               Past Medical History:  Diagnosis Date   Anemia    low iron   Anxiety    Arthritis    Coronary artery disease    a. s/p prior stenting to RCA b. CABG in 09/2016 with LIMA-LAD, SVG-OM and SVG-RCA c. low-risk NST in 01/2019   Depression    Fibromyalgia    Gallstones    GERD (gastroesophageal reflux disease)    Headache    migraines   MI (myocardial infarction) (HCC) 01/2016   Peptic ulcer    Seizures (HCC)    as a baby   Past Surgical History:  Procedure Laterality Date   ABDOMINAL HYSTERECTOMY     CARDIAC CATHETERIZATION N/A 02/19/2016   Procedure: Left Heart Cath and Coronary Angiography;  Surgeon: Yates Decamp, MD;  Location: [facility] INVASIVE CV LAB;  Service: Cardiovascular;  Laterality: N/A;   CARDIAC CATHETERIZATION N/A 02/19/2016   Procedure: Coronary Stent Intervention;  Surgeon: Yates Decamp, MD;  Location: [facility] INVASIVE CV LAB;  Service: Cardiovascular;  Laterality: N/A;   CESAREAN SECTION     x 2   CHOLECYSTECTOMY N/A 01/25/2017   Procedure: LAPAROSCOPIC CHOLECYSTECTOMY;  Surgeon: Franky Macho, MD;  Location: [facility] ORS;  Service: General;  Laterality: N/A;   COLONOSCOPY     CORONARY ARTERY BYPASS GRAFT N/A 10/26/2016   Procedure: CORONARY ARTERY BYPASS GRAFTING (CABG), ON PUMP, TIMES THREE, USING LEFT INTERNAL MAMMARY ARTERY AND ENDOSCOPICALLY HARVESTED RIGHT GREATER SAPHENOUS VEIN;  Surgeon: Alleen Borne, MD;  Location: [facility] OR;   Service: Open Heart Surgery;  Laterality: N/A;  LIMA-LAD SVG-RCA SVG-OM   ESOPHAGOGASTRODUODENOSCOPY     LEFT HEART CATH AND CORONARY ANGIOGRAPHY N/A 10/22/2016   Procedure: Left Heart Cath and Coronary Angiography;  Surgeon: Lennette Bihari, MD;  Location: [facility] INVASIVE CV LAB;  Service: Cardiovascular;  Laterality: N/A;   STERNAL WIRES REMOVAL N/A 07/11/2017   Procedure: STERNAL WIRES REMOVAL;  Surgeon: Alleen Borne, MD;  Location: [facility] OR;  Service: Thoracic;  Laterality: N/A;   TEE WITHOUT CARDIOVERSION N/A 10/26/2016   Procedure: TRANSESOPHAGEAL ECHOCARDIOGRAM (TEE);  Surgeon: Alleen Borne, MD;  Location: [facility] OR;  Service: Open Heart Surgery;  Laterality: N/A;   WRIST FRACTURE SURGERY Left    3 times   Patient Active Problem List   Diagnosis Date Noted   Chest pain 11/09/2020   Calculus of gallbladder without cholecystitis without obstruction    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    THERAPY DIAG:  Left knee pain, unspecified chronicity  Stiffness of left knee, not elsewhere classified  Rationale for Evaluation and Treatment Rehabilitation  ONSET DATE: 04/05/22  SUBJECTIVE:   SUBJECTIVE STATEMENT: Walking more, plans to return to work 11/9, has been cleared by her Careers adviser. Has been up and down a lot.  PERTINENT HISTORY: Lt tibial plateau fracture   PAIN:  Are you having pain? Yes: NPRS scale: 3-4/10 Pain location: LT knee  Pain description: Sore  Aggravating factors: walking,  WB Relieving factors: non WB, meds   PRECAUTIONS: None  WEIGHT BEARING RESTRICTIONS No  FALLS:  Has patient fallen in last 6 months? Yes. Number of falls 1  LIVING ENVIRONMENT: Lives with: lives with their family Lives in: House/apartment Stairs: Yes: External: 2 steps; bilateral but cannot reach both Has following equipment at home: Crutches  OCCUPATION: International aid/development worker at [employer]   PLOF: Independent  PATIENT GOALS Be able to walk and live my normal life again   OBJECTIVE:   DIAGNOSTIC FINDINGS: IMPRESSION: Acute nondisplaced fracture of the medial tibial plateau.    PATIENT SURVEYS:  FOTO 46% function   COGNITION:  Overall cognitive status: Within functional limits for tasks assessed      LOWER EXTREMITY ROM:  Active ROM Right eval Left eval Left 05/17/22 Left 05/21/22  Hip flexion      Hip extension      Hip abduction      Hip adduction      Hip internal rotation      Hip external rotation      Knee flexion 130 60 AAROM 113 123  Knee extension 0 -3 0 0  Ankle dorsiflexion      Ankle plantarflexion      Ankle inversion      Ankle eversion       (Blank rows = not tested)  LOWER EXTREMITY MMT: (unable to lay prone due to pain)   MMT Right eval Left eval  Hip flexion 5 4-  Hip extension    Hip abduction 4+ 3+  Hip adduction    Hip internal rotation    Hip external rotation    Knee flexion    Knee extension 5 4-  Ankle dorsiflexion 5 5  Ankle plantarflexion    Ankle inversion    Ankle eversion     (Blank rows = not tested)   FUNCTIONAL TESTS:  2 minute walk test: 190 feet  GAIT: Distance walked: 190 feet Assistive device utilized: None Level of assistance: Complete Independence Comments: decreased stride, antalgic, Decreased stance on LT     TODAY'S TREATMENT: 05/28/22: RB lvl 1 x5' for AROM Lt knee and warm-up     2RT stairs 7in with 1HR reciprocal pattern Squat then heel raise 2x10 Lateral step up 7in 15x Forward lunge  BLE minimal HHA 15x from floor Vector stance 3x 5" 6" and 12" hurdles forward x 6 RT SLS 2x30" Tandem walk 10' x2RT   05/25/22: Squat then heel raise 10x  Lateral step up 6in 15x Forward lunge BLE minimal HHA 15x 6in 2RT stairs 7in with 1HR reciprocal pattern Vector stance 3x 5" SLS Rt 16", LT 6"  05/23/22 Vitals following reports of SOB initially O2 sat 93% and HR at 96 Diaphragmatic breathing x 1 min, improved O2 sat 96% and reduced HR to 80 Knee drive 5x 10"  on 8in step height for flexion Manual patella and tib/fib mobs all directions Sidelying quad stretch 3x 30" with rope AROM 0-123 degrees Standing: squats 2x 10 cueing for mechanics for increased gluteal activation 4in lateral step up 15x 1 HR Forward step down 4 then 6in 10x 1 HR     PATIENT EDUCATION:  Education details: 10/19/23HEP; EVAL: on eval findings, POC and HEP  Person educated: Patient Education method: Explanation Education comprehension: verbalized understanding   HOME EXERCISE PROGRAM: Access Code: [email or web address] URL: [email or web address]  05/25/22:  Squat, SLS and vector stance  05/17/22- Mini Squat with Counter Support  - 1 x daily - 7 x weekly - 2 sets - 10 reps  05/10/22 - Sidelying Hip Abduction  - 3 x daily - 7 x weekly - 2 sets - 10 reps - Heel Raises with Counter Support  - 3 x daily - 7 x weekly - 2 sets - 10 reps - Seated Calf Stretch with Strap  - 3 x daily - 7 x weekly - 2 sets - 5 reps - 10 second hold  Date: 05/03/2022 Prepared by: Josue Hector  Exercises - Supine Quad Set  - 3 x daily - 7 x weekly - 1-2 sets - 10 reps - 5 second hold - Active Straight Leg Raise with Quad Set  - 3 x daily - 7 x weekly - 1-2 sets - 10 reps - Supine Heel Slide  - 3 x daily - 7 x weekly - 1-2 sets - 10 reps - 5 second hold - Supine Ankle Pumps  - 3 x daily - 7 x weekly - 1-2 sets - 10 reps  ASSESSMENT:  CLINICAL IMPRESSION: Further increase in volume and pace to keep up with  demands of occupation as she will be returning to work 11/9. Made minor compensatory techniques with stairs which reduced symptoms on descent (15 deg of LLE ER.) Progressed depth of lunge as tolerated. Patient will continue to benefit from skilled physical therapy services to further improve independence with functional mobility.  OBJECTIVE IMPAIRMENTS Abnormal gait, decreased activity tolerance, decreased balance, decreased mobility, difficulty walking, decreased ROM, decreased strength, increased edema, increased fascial restrictions, impaired flexibility, improper body mechanics, and pain.   ACTIVITY LIMITATIONS standing, squatting, stairs, transfers, and locomotion level  PARTICIPATION LIMITATIONS: cleaning, laundry, driving, shopping, community activity, occupation, and yard work  PERSONAL FACTORS  none  are also affecting patient's functional outcome.   REHAB POTENTIAL: Good  CLINICAL DECISION MAKING: Stable/uncomplicated  EVALUATION COMPLEXITY: Low   GOALS: SHORT TERM GOALS: Target date: 05/24/2022  Patient will be independent with initial HEP and self-management strategies to improve functional outcomes Baseline:  Goal status: IN PROGRESS    LONG TERM GOALS: Target date: 06/14/2022  Patient will be independent with advanced HEP and self-management strategies to improve functional outcomes Baseline:  Goal status: IN PROGRESS  2.  Patient will improve FOTO score to predicted value to indicate improvement in functional outcomes Baseline: 46% function  Goal status: IN PROGRESS  3.  Patient will have LT knee AROM 0-120 degrees to improve functional mobility and facilitate squatting to pick up items from floor. Baseline: -3 to 60 deg; 05/23/22 0-123 degrees Goal status: IN PROGRESS  4. Patient will have equal to or > 4+/5 MMT throughout LLE to improve ability to perform functional mobility, stair ambulation and ADLs.  Baseline: See MMT Goal status: IN PROGRESS  5. Patient  will be able to ambulate at least 325 feet during 2MWT with LRAD to demonstrate  improved ability to perform functional mobility and associated tasks. Baseline: 190 feet Goal status: IN PROGRESS   PLAN:  PT FREQUENCY: 2x/week  PT DURATION: 6 weeks  PLANNED INTERVENTIONS: Therapeutic exercises, Therapeutic activity, Neuromuscular re-education, Balance training, Gait training, Patient/Family education, Joint manipulation, Joint mobilization, Stair training, Aquatic Therapy, Dry Needling, Electrical stimulation, Spinal manipulation, Spinal mobilization, Cryotherapy, Moist heat, scar mobilization, Taping, Traction, Ultrasound, Biofeedback, Ionotophoresis 4mg /ml Dexamethasone, and Manual therapy.  PLAN FOR NEXT SESSION: Reassess, consider D/c -  RTW 06/07/22.  13/9/23, PT, DPT Physical Therapist Acute Rehabilitation Services [facility] & [facility] [place]   3:29 PM, 05/28/22

## 2022-05-30 ENCOUNTER — Ambulatory Visit (HOSPITAL_COMMUNITY): Payer: 59 | Attending: Internal Medicine | Admitting: Physical Therapy

## 2022-05-30 DIAGNOSIS — M25662 Stiffness of left knee, not elsewhere classified: Secondary | ICD-10-CM | POA: Diagnosis present

## 2022-05-30 DIAGNOSIS — M25562 Pain in left knee: Secondary | ICD-10-CM | POA: Diagnosis present

## 2022-05-30 NOTE — Therapy (Signed)
OUTPATIENT PHYSICAL THERAPY LOWER EXTREMITY TREATMENT   Patient Name: Carolyn Gaines MRN: [id number] DOB:12-Feb-1966, 56 y.o., female Today's Date: 05/30/2022  PHYSICAL THERAPY DISCHARGE SUMMARY  Visits from Start of Care: 7  Current functional level related to goals / functional outcomes: See below    Remaining deficits: See below    Education / Equipment: See assessment    Patient agrees to discharge. Patient goals were partially met. Patient is being discharged due to being pleased with the current functional level.   PT End of Session - 05/30/22 1033     Visit Number 7    Number of Visits 12    Date for PT Re-Evaluation 06/14/22    Authorization Type [employer]    Progress Note Due on Visit 10    PT Start Time 1032    PT Stop Time 1110    PT Time Calculation (min) 38 min    Activity Tolerance Patient tolerated treatment well    Behavior During Therapy WFL for tasks assessed/performed               Past Medical History:  Diagnosis Date   Anemia    low iron   Anxiety    Arthritis    Coronary artery disease    a. s/p prior stenting to RCA b. CABG in 09/2016 with LIMA-LAD, SVG-OM and SVG-RCA c. low-risk NST in 01/2019   Depression    Fibromyalgia    Gallstones    GERD (gastroesophageal reflux disease)    Headache    migraines   MI (myocardial infarction) ([place]) 01/2016   Peptic ulcer    Seizures ([place])    as a baby   Past Surgical History:  Procedure Laterality Date   ABDOMINAL HYSTERECTOMY     CARDIAC CATHETERIZATION N/A 02/19/2016   Procedure: Left Heart Cath and Coronary Angiography;  Surgeon: Adrian Prows, MD;  Location: [facility];  Service: Cardiovascular;  Laterality: N/A;   CARDIAC CATHETERIZATION N/A 02/19/2016   Procedure: Coronary Stent Intervention;  Surgeon: Adrian Prows, MD;  Location: [facility];  Service: Cardiovascular;  Laterality: N/A;   CESAREAN SECTION     x 2   CHOLECYSTECTOMY N/A 01/25/2017   Procedure: LAPAROSCOPIC  CHOLECYSTECTOMY;  Surgeon: Aviva Signs, MD;  Location: [facility];  Service: General;  Laterality: N/A;   COLONOSCOPY     CORONARY ARTERY BYPASS GRAFT N/A 10/26/2016   Procedure: CORONARY ARTERY BYPASS GRAFTING (CABG), ON PUMP, TIMES THREE, USING LEFT INTERNAL MAMMARY ARTERY AND ENDOSCOPICALLY HARVESTED RIGHT GREATER SAPHENOUS VEIN;  Surgeon: Gaye Pollack, MD;  Location: [facility];  Service: Open Heart Surgery;  Laterality: N/A;  LIMA-LAD SVG-RCA SVG-OM   ESOPHAGOGASTRODUODENOSCOPY     LEFT HEART CATH AND CORONARY ANGIOGRAPHY N/A 10/22/2016   Procedure: Left Heart Cath and Coronary Angiography;  Surgeon: Troy Sine, MD;  Location: [facility];  Service: Cardiovascular;  Laterality: N/A;   STERNAL WIRES REMOVAL N/A 07/11/2017   Procedure: STERNAL WIRES REMOVAL;  Surgeon: Gaye Pollack, MD;  Location: [facility];  Service: Thoracic;  Laterality: N/A;   TEE WITHOUT CARDIOVERSION N/A 10/26/2016   Procedure: TRANSESOPHAGEAL ECHOCARDIOGRAM (TEE);  Surgeon: Gaye Pollack, MD;  Location: [facility];  Service: Open Heart Surgery;  Laterality: N/A;   WRIST FRACTURE SURGERY Left    3 times   Patient Active Problem List   Diagnosis Date Noted   Chest pain 11/09/2020   Calculus of gallbladder without cholecystitis without obstruction    S/P CABG  x 3 10/26/2016   Unstable angina ([place])    Fibromyalgia 10/20/2016   Essential hypertension 07/18/2016   Psoriasis 07/18/2016   Precordial chest pain 07/03/2016   Depression with anxiety 07/03/2016   Normocytic anemia 07/03/2016   History of acute inferior wall MI 02/19/2016   Coronary artery disease involving native heart without angina pectoris 02/19/2016    PCP: Celene Squibb MD  REFERRING PROVIDER: Altamese Ward, MD   REFERRING DIAG: IMPRESSION: Acute nondisplaced fracture of the medial tibial plateau.    THERAPY DIAG:  Left knee pain, unspecified chronicity  Stiffness of left knee, not elsewhere classified  Rationale for Evaluation and  Treatment Rehabilitation  ONSET DATE: 04/05/22  SUBJECTIVE:   SUBJECTIVE STATEMENT: Doing well overall. Feels about 85% improved since starting therapy. Planning to return to work next week. Feels ready for DC today.   PERTINENT HISTORY: Lt tibial plateau fracture   PAIN:  Are you having pain? Yes: NPRS scale: 2-3/10 Pain location: LT knee  Pain description: Sore  Aggravating factors: walking, WB Relieving factors: non WB, meds   PRECAUTIONS: None  WEIGHT BEARING RESTRICTIONS No  FALLS:  Has patient fallen in last 6 months? Yes. Number of falls 1  LIVING ENVIRONMENT: Lives with: lives with their family Lives in: House/apartment Stairs: Yes: External: 2 steps; bilateral but cannot reach both Has following equipment at home: Crutches  OCCUPATION: Radio broadcast assistant at [employer] Be able to walk and live my normal life again   OBJECTIVE:   DIAGNOSTIC FINDINGS: IMPRESSION: Acute nondisplaced fracture of the medial tibial plateau.    PATIENT SURVEYS:  FOTO 77% (was 46% function)   COGNITION:  Overall cognitive status: Within functional limits for tasks assessed      LOWER EXTREMITY ROM:  Active ROM Right eval Left eval Left 05/17/22 Left 05/21/22 Left 05/30/22  Hip flexion       Hip extension       Hip abduction       Hip adduction       Hip internal rotation       Hip external rotation       Knee flexion 130 60 AAROM 113 123 125  Knee extension 0 -3 0 0 0  Ankle dorsiflexion       Ankle plantarflexion       Ankle inversion       Ankle eversion        (Blank rows = not tested)  LOWER EXTREMITY MMT:   MMT Right eval Left eval Left 05/30/22  Hip flexion 5 4- 5  Hip extension  4+ 4  Hip abduction 4+ 3+ 4+  Hip adduction     Hip internal rotation     Hip external rotation     Knee flexion   5  Knee extension 5 4- 4+  Ankle dorsiflexion 5 5   Ankle plantarflexion     Ankle inversion     Ankle eversion       (Blank rows = not tested)   FUNCTIONAL TESTS:  2 minute walk test: 475 feet (was 190 feet)  GAIT: Distance walked: 475 feet Assistive device utilized: None Level of assistance: Complete Independence Comments: WFL    TODAY'S TREATMENT: 05/30/22 Reassess for DC Rec bike 5 min warmup lv 2  FOTO MMT ROM 2MWT  05/28/22: RB lvl 1 x5' for AROM Lt knee and warm-up     2RT stairs 7in with 1HR reciprocal pattern Squat then heel raise  2x10 Lateral step up 7in 15x Forward lunge BLE minimal HHA 15x from floor Vector stance 3x 5" 6" and 12" hurdles forward x 6 RT SLS 2x30" Tandem walk 10' x2RT   05/25/22: Squat then heel raise 10x  Lateral step up 6in 15x Forward lunge BLE minimal HHA 15x 6in 2RT stairs 7in with 1HR reciprocal pattern Vector stance 3x 5" SLS Rt 16", LT 6"   PATIENT EDUCATION:  Education details: 10/19/23HEP; EVAL: on eval findings, POC and HEP  Person educated: Patient Education method: Explanation Education comprehension: verbalized understanding   HOME EXERCISE PROGRAM: Access Code: [email or web address] URL: [email or web address] 05/30/22 - Hip Abduction with Resistance Loop  - 1 x daily - 5-7 x weekly - 1-2 sets - 10 reps - Hip Extension with Resistance Loop  - 1 x daily - 5-7 x weekly - 1-2 sets - 10 reps - Tandem Stance with Support  - 1 x daily - 5-7 x weekly - 1 sets - 4 reps - 20-30 second hold   05/25/22:  Squat, SLS and vector stance  05/17/22- Mini Squat with Counter Support  - 1 x daily - 7 x weekly - 2 sets - 10 reps  05/10/22 - Sidelying Hip Abduction  - 3 x daily - 7 x weekly - 2 sets - 10 reps - Heel Raises with Counter Support  - 3 x daily - 7 x weekly - 2 sets - 10 reps - Seated Calf Stretch with Strap  - 3 x daily - 7 x weekly - 2 sets - 5 reps - 10 second hold  Date: 05/03/2022 Prepared by: Josue Hector  Exercises - Supine Quad Set  - 3 x daily - 7 x weekly - 1-2 sets - 10 reps - 5 second hold - Active Straight Leg  Raise with Quad Set  - 3 x daily - 7 x weekly - 1-2 sets - 10 reps - Supine Heel Slide  - 3 x daily - 7 x weekly - 1-2 sets - 10 reps - 5 second hold - Supine Ankle Pumps  - 3 x daily - 7 x weekly - 1-2 sets - 10 reps  ASSESSMENT:  CLINICAL IMPRESSION: Patient has made good progress to therapy goals and has currently met all long term goals except slight ongoing glute weakness on LT. Patient feels ready for DC at this time, in preparation for return to work next week. Performed reassess, reviewed comprehensive HEP and answered all patient questions. Printed and issued HEP updates. Encouraged patient to follow up with therapy services with any farther questions or concerns.   OBJECTIVE IMPAIRMENTS Abnormal gait, decreased activity tolerance, decreased balance, decreased mobility, difficulty walking, decreased ROM, decreased strength, increased edema, increased fascial restrictions, impaired flexibility, improper body mechanics, and pain.   ACTIVITY LIMITATIONS standing, squatting, stairs, transfers, and locomotion level  PARTICIPATION LIMITATIONS: cleaning, laundry, driving, shopping, community activity, occupation, and yard work  PERSONAL FACTORS  none  are also affecting patient's functional outcome.   REHAB POTENTIAL: Good  CLINICAL DECISION MAKING: Stable/uncomplicated  EVALUATION COMPLEXITY: Low   GOALS: SHORT TERM GOALS: Target date: 05/24/2022  Patient will be independent with initial HEP and self-management strategies to improve functional outcomes Baseline:  Goal status: MET    LONG TERM GOALS: Target date: 06/14/2022  Patient will be independent with advanced HEP and self-management strategies to improve functional outcomes Baseline: Reviewed and answered all questions Goal status: MET  2.  Patient will improve FOTO score to predicted value to indicate improvement in functional  outcomes Baseline: 77% function  Goal status: MET  3.  Patient will have LT knee AROM  0-120 degrees to improve functional mobility and facilitate squatting to pick up items from floor. Baseline: current 0-125 deg Goal status: MET  4. Patient will have equal to or > 4+/5 MMT throughout LLE to improve ability to perform functional mobility, stair ambulation and ADLs.  Baseline: See MMT Goal status: Partially MET (all except LT hip extension)   5. Patient will be able to ambulate at least 325 feet during 2MWT with LRAD to demonstrate improved ability to perform functional mobility and associated tasks. Baseline: 475 feet Goal status: MET   PLAN:  PT FREQUENCY: 2x/week  PT DURATION: 6 weeks  PLANNED INTERVENTIONS: Therapeutic exercises, Therapeutic activity, Neuromuscular re-education, Balance training, Gait training, Patient/Family education, Joint manipulation, Joint mobilization, Stair training, Aquatic Therapy, Dry Needling, Electrical stimulation, Spinal manipulation, Spinal mobilization, Cryotherapy, Moist heat, scar mobilization, Taping, Traction, Ultrasound, Biofeedback, Ionotophoresis 47m/ml Dexamethasone, and Manual therapy.  PLAN FOR NEXT SESSION: DC to HEP   11:08 AM, 05/30/22 CJosue HectorPT DPT  Physical Therapist [facility] [phone]

## 2022-06-05 ENCOUNTER — Encounter (HOSPITAL_COMMUNITY): Payer: 59 | Admitting: Physical Therapy

## 2022-06-07 ENCOUNTER — Encounter (HOSPITAL_COMMUNITY): Payer: 59 | Admitting: Physical Therapy

## 2022-06-12 ENCOUNTER — Encounter (HOSPITAL_COMMUNITY): Payer: 59 | Admitting: Physical Therapy

## 2022-06-14 ENCOUNTER — Encounter (HOSPITAL_COMMUNITY): Payer: 59 | Admitting: Physical Therapy

## 2023-03-01 ENCOUNTER — Other Ambulatory Visit (HOSPITAL_COMMUNITY): Payer: Self-pay | Admitting: Family Medicine

## 2023-03-01 DIAGNOSIS — R1011 Right upper quadrant pain: Secondary | ICD-10-CM

## 2023-03-11 ENCOUNTER — Encounter (HOSPITAL_COMMUNITY): Payer: Self-pay

## 2023-03-11 ENCOUNTER — Ambulatory Visit (HOSPITAL_COMMUNITY): Payer: Medicaid Other

## 2023-03-13 ENCOUNTER — Other Ambulatory Visit: Payer: Self-pay | Admitting: Physician Assistant

## 2023-03-21 ENCOUNTER — Encounter (INDEPENDENT_AMBULATORY_CARE_PROVIDER_SITE_OTHER): Payer: Self-pay | Admitting: *Deleted

## 2023-04-02 ENCOUNTER — Ambulatory Visit (INDEPENDENT_AMBULATORY_CARE_PROVIDER_SITE_OTHER): Payer: Medicaid Other | Admitting: Gastroenterology

## 2023-04-02 ENCOUNTER — Ambulatory Visit (HOSPITAL_COMMUNITY)
Admission: RE | Admit: 2023-04-02 | Discharge: 2023-04-02 | Disposition: A | Discharge status: Home / Self Care | Payer: Medicaid Other | Source: Ambulatory Visit | Attending: Gastroenterology | Admitting: Gastroenterology

## 2023-04-02 ENCOUNTER — Encounter (INDEPENDENT_AMBULATORY_CARE_PROVIDER_SITE_OTHER): Payer: Self-pay | Admitting: Gastroenterology

## 2023-04-02 ENCOUNTER — Encounter (INDEPENDENT_AMBULATORY_CARE_PROVIDER_SITE_OTHER): Payer: Self-pay

## 2023-04-02 VITALS — BP 108/74 | HR 80 | Temp 98.9°F | Ht 63.0 in | Wt 198.0 lb

## 2023-04-02 DIAGNOSIS — K279 Peptic ulcer, site unspecified, unspecified as acute or chronic, without hemorrhage or perforation: Secondary | ICD-10-CM | POA: Diagnosis not present

## 2023-04-02 DIAGNOSIS — K529 Noninfective gastroenteritis and colitis, unspecified: Secondary | ICD-10-CM

## 2023-04-02 DIAGNOSIS — Z6835 Body mass index (BMI) 35.0-35.9, adult: Secondary | ICD-10-CM | POA: Diagnosis not present

## 2023-04-02 DIAGNOSIS — K269 Duodenal ulcer, unspecified as acute or chronic, without hemorrhage or perforation: Secondary | ICD-10-CM | POA: Insufficient documentation

## 2023-04-02 DIAGNOSIS — R1012 Left upper quadrant pain: Secondary | ICD-10-CM | POA: Insufficient documentation

## 2023-04-02 DIAGNOSIS — K5904 Chronic idiopathic constipation: Secondary | ICD-10-CM | POA: Insufficient documentation

## 2023-04-02 MED ORDER — POLYETHYLENE GLYCOL 3350 17 G PO PACK
17.0000 g | PACK | Freq: Two times a day (BID) | ORAL | 0 refills | Status: AC
Start: 2023-04-02 — End: 2023-07-01

## 2023-04-02 MED ORDER — PSYLLIUM 58.6 % PO PACK
1.0000 | PACK | Freq: Two times a day (BID) | ORAL | 2 refills | Status: AC
Start: 2023-04-02 — End: 2023-07-01

## 2023-04-02 NOTE — H&P (View-Only) (Signed)
Vista Lawman , M.D. Gastroenterology & Hepatology [facility] [name] [place], [place] [address] Primary Care Physician: Benita Stabile, MD [address] [place] [address]  Chief Complaint: Abdominal pain , constipation and history of peptic ulcer disease  History of Present Illness: Carolyn Gaines is a 57 y.o. female with CAD status post PCI and CABG, hypertension, fibromyalgia ,anxiety depression, DVT not on anticoagulation anymore who presents for evaluation of Abdominal pain , constipation and history of peptic ulcer disease  Patient reports that for past 2 to 3 days she has intermittent abdominal pain mostly located at left upper quadrant which is a stabbing-like sensation currently in the clinic she has 8 out of 10 pain nonradiating without any aggravating or relieving factors.  Patient takes tramadol for her chronic pain but denies taking any NSAIDs.  Patient reports a history of bleeding peptic ulcer disease when she was 57 year old. Diagnosed with DVT 01/25/21 and started on Eliquis.   The patient denies having any nausea, vomiting, fever, chills, hematochezia, melena, hematemesis,  diarrhea, jaundice, pruritus or weight loss.  Last EGD:2018  Normal esophagus. - Normal stomach. Biopsied. - Normal examined duodenum.   Last Colonoscopy:- The entire examined colon is normal on direct and retroflexion views. - No specimens collected.  Suggested repeat 10 years  Lab work normal liver enzymes hemoglobin 13 MCV 87 done on 01/2023  CT abdomen pelvis with contrast is ordered  FHx: neg for any gastrointestinal/liver disease, no malignancies Social: neg smoking, alcohol or illicit drug use Surgical:  cholecystectomy and prior hysterectomy.  Past Medical History: Past Medical History:  Diagnosis Date   Anemia    low iron   Anxiety    Arthritis    Bleeding ulcer    age 65. spent one week in hospital with bleeding  ulcers.   Coronary artery disease    a. s/p prior stenting to RCA b. CABG in 09/2016 with LIMA-LAD, SVG-OM and SVG-RCA c. low-risk NST in 01/2019   Depression    Fibromyalgia    Gallstones    GERD (gastroesophageal reflux disease)    Headache    migraines   MI (myocardial infarction) ([facility]) 01/2016   Peptic ulcer    Seizures ([facility])    as a baby    Past Surgical History: Past Surgical History:  Procedure Laterality Date   ABDOMINAL HYSTERECTOMY     CARDIAC CATHETERIZATION N/A 02/19/2016   Procedure: Left Heart Cath and Coronary Angiography;  Surgeon: Yates Decamp, MD;  Location: [facility];  Service: Cardiovascular;  Laterality: N/A;   CARDIAC CATHETERIZATION N/A 02/19/2016   Procedure: Coronary Stent Intervention;  Surgeon: Yates Decamp, MD;  Location: [facility];  Service: Cardiovascular;  Laterality: N/A;   CESAREAN SECTION     x 2   CHOLECYSTECTOMY N/A 01/25/2017   Procedure: LAPAROSCOPIC CHOLECYSTECTOMY;  Surgeon: Franky Macho, MD;  Location: [facility];  Service: General;  Laterality: N/A;   COLONOSCOPY     CORONARY ARTERY BYPASS GRAFT N/A 10/26/2016   Procedure: CORONARY ARTERY BYPASS GRAFTING (CABG), ON PUMP, TIMES THREE, USING LEFT INTERNAL MAMMARY ARTERY AND ENDOSCOPICALLY HARVESTED RIGHT GREATER SAPHENOUS VEIN;  Surgeon: Alleen Borne, MD;  Location: [facility];  Service: Open Heart Surgery;  Laterality: N/A;  LIMA-LAD SVG-RCA SVG-OM   ESOPHAGOGASTRODUODENOSCOPY     LEFT HEART CATH AND CORONARY ANGIOGRAPHY N/A 10/22/2016   Procedure: Left Heart Cath and Coronary Angiography;  Surgeon: Lennette Bihari, MD;  Location: [facility];  Service: Cardiovascular;  Laterality: N/A;   STERNAL WIRES REMOVAL N/A 07/11/2017   Procedure: STERNAL WIRES REMOVAL;  Surgeon: Alleen Borne, MD;  Location: [facility];  Service: Thoracic;  Laterality: N/A;   TEE WITHOUT CARDIOVERSION N/A 10/26/2016   Procedure: TRANSESOPHAGEAL ECHOCARDIOGRAM (TEE);  Surgeon: Alleen Borne, MD;  [facility];  Service: Open Heart Surgery;  Laterality: N/A;   WRIST FRACTURE SURGERY Left    3 times    Family History: Family History  Problem Relation Age of Onset   COPD Mother    Arthritis Mother    Heart disease Mother    Stroke Mother    Psoriasis Father    Heart disease Father    Diabetes Maternal Grandmother    Diabetes Maternal Aunt    Diabetes Maternal Aunt     Social History: Social History   Tobacco Use  Smoking Status Former   Current packs/day: 0.00   Types: Cigarettes   Quit date: 03/21/2011   Years since quitting: 12.0  Smokeless Tobacco Never   Social History   Substance and Sexual Activity  Alcohol Use Yes   Comment: rare   Social History   Substance and Sexual Activity  Drug Use No    Allergies: Allergies  Allergen Reactions   Bee Venom Other (See Comments)    Pus sites at injection   Elastic Bandages & [Zinc] Hives   Mango Flavor Hives    All mango   Neosporin [Bacitracin-Polymyxin B]    Adhesive [Tape] Itching and Swelling    Please use paper tape   Triple Antibiotic [Bacitracin-Neomycin-Polymyxin] Rash    Medications: Current Outpatient Medications  Medication Sig Dispense Refill   acetaminophen (TYLENOL) 500 MG tablet Take 1,000 mg by mouth every 6 (six) hours as needed for mild pain.     carvedilol (COREG) 3.125 MG tablet Take 1 tablet (3.125 mg total) by mouth 2 (two) times daily. 180 tablet 3   DULoxetine (CYMBALTA) 60 MG capsule Take 60 mg by mouth daily.     ezetimibe (ZETIA) 10 MG tablet Take 1 tablet (10 mg total) by mouth daily. 90 tablet 3   lidocaine (LIDODERM) 5 % 1 patch daily. As needed     nitroGLYCERIN (NITROSTAT) 0.4 MG SL tablet DISSOLVE 1 TABLET UNDER THE TONGUE EVERY 5 MINUTES FOR 3 DOSES AS NEEDED FOR CHEST PAIN 25 tablet 3   polyethylene glycol (MIRALAX / GLYCOLAX) 17 g packet Take 17 g by mouth 2 (two) times daily. 180 packet 0   pregabalin (LYRICA) 75 MG capsule Take 75 mg by mouth 2 (two) times daily.      psyllium (METAMUCIL) 58.6 % packet Take 1 packet by mouth 2 (two) times daily. 60 packet 2   rosuvastatin (CRESTOR) 40 MG tablet Take 1 tablet (40 mg total) by mouth every evening. 90 tablet 3   No current facility-administered medications for this visit.    Review of Systems: GENERAL: negative for malaise, night sweats HEENT: No changes in hearing or vision, no nose bleeds or other nasal problems. NECK: Negative for lumps, goiter, pain and significant neck swelling RESPIRATORY: Negative for cough, wheezing CARDIOVASCULAR: Negative for chest pain, leg swelling, palpitations, orthopnea GI: SEE HPI MUSCULOSKELETAL: Negative for joint pain or swelling, back pain, and muscle pain. SKIN: Negative for lesions, rash HEMATOLOGY Negative for prolonged bleeding, bruising easily, and swollen nodes. ENDOCRINE: Negative for cold or heat intolerance, polyuria, polydipsia and goiter. NEURO: negative for tremor, gait imbalance,  syncope and seizures. The remainder of the review of systems is noncontributory.   Physical Exam: BP 108/74 (BP Location: Left Arm, Patient Position: Sitting, Cuff Size: Large)   Pulse 80   Temp 98.9 F (37.2 C) (Oral)   Ht 5\' 3"  (1.6 m)   Wt 198 lb (89.8 kg)   BMI 35.07 kg/m  GENERAL: The patient is AO x3, in no acute distress. HEENT: Head is normocephalic and atraumatic. EOMI are intact. Mouth is well hydrated and without lesions. NECK: Supple. No masses LUNGS: Clear to auscultation. No presence of rhonchi/wheezing/rales. Adequate chest expansion HEART: RRR, normal s1 and s2. ABDOMEN: Soft, RUQ tenderness and Lower abdomen tenderness,no guarding, no peritoneal signs, and nondistended. BS +. No masses. EXTREMITIES: Without any cyanosis, clubbing, rash, lesions . 1+pitting edema , no asymmetry , redness, pain   NEUROLOGIC: AOx3, no focal motor deficit.    Imaging/Labs: as above     Latest Ref Rng & Units 01/25/2021    4:40 PM 11/10/2020    4:04 AM 11/09/2020     5:39 PM  CBC  WBC 4.0 - 10.5 K/uL 4.7  5.1  4.3   Hemoglobin 12.0 - 15.0 g/dL 13.0  86.5  78.4   Hematocrit 36.0 - 46.0 % 38.0  35.1  38.0   Platelets 150 - 400 K/uL 265  237  252    Lab Results  Component Value Date   IRON 54 10/20/2016   TIBC 220 (L) 10/20/2016   FERRITIN 38 10/20/2016    I personally reviewed and interpreted the available labs, imaging and endoscopic files.  2022 1. Subcentimeter bilateral lower lobe noncalcified lung nodules. Further evaluation with chest CT is recommended to determine the presence or absence of additional noncalcified lung nodules. 2. Evidence of prior cholecystectomy and prior hysterectomy. 3. No acute or active process within the abdomen or pelvis.    Impression and Plan:  Carolyn Gaines is a 57 y.o. female with CAD status post PCI and CABG, hypertension, fibromyalgia ,anxiety depression, DVT not on anticoagulation anymore who presents for evaluation of Abdominal pain , constipation and history of peptic ulcer disease  #Abdominal pain  #Peptic ulcer disease  Patient has significant abdominal pain mostly located left upper quadrant and on exam she has tenderness on left upper quadrant and bilateral lower quadrant as well.  Pain is not positional without any aggravating or relieving factors.  This could be peptic ulcer disease, diverticulitis, constipation induced functional pain  Patient last upper endoscopy 2018 without any ulcers but patient reports bleeding gastric ulcers when she was age 1  Given history of peptic ulcer disease and significant tenderness on exam reasonable to pursue upper endoscopy to rule out any underlying ulcers  Also patient has chronic constipation this could be diverticulitis and needs to be assessed with CT abdomen pelvis  #Chronic constipation  Patient has chronic constipation with bowel movement every 2 to 3 days with much straining only taking Dulcolax.  Recs: Will obtain abdominal x-ray to assess stool  burden if large amount is encountered would benefit from bowel purge at that time Ensure adequate fluid intake: Aim for 8 glasses of water daily. Follow a high fiber diet: Include foods such as dates, prunes, pears, and kiwi. Take Miralax twice a day for the first week, then reduce to once daily thereafter. Use Metamucil twice a day.  #History of DVT Patient is concerned for lower extremity DVTs given history of DVT in 2022 currently not on any anticoagulation.  On exam  she does not have bilateral lower extremity asymmetry pain or redness but has bilateral lower extremity trace edema Would recommend following with PCP for further evaluation  #BMI :35       - walking at a brisk pace/biking at moderate intesity 2.5-5 hours per week     - use pedometer/step counter to track activity     - goal to lose 5-10% of initial body weight     - avoid suagry drinks and juices, use zero calorie beverages     - increase water intake     - eat a low carb diet with plenty of veggies and fruit     - Get sufficient sleep 7-8 hrs nightly     - maitain active lifestyle     - avoid alcohol     - recommend 2-3 cups Coffee daily     - Counsel on lowering cholesterol by having a diet rich in vegetables,          protein (avoid red meats) and good fats(fish, salmon).   All questions were answered.      Vista Lawman, MD Gastroenterology and Hepatology [facility] Gastroenterology   This chart has been completed using [facility] Dictation software, and while attempts have been made to ensure accuracy , certain words and phrases may not be transcribed as intended

## 2023-04-02 NOTE — Progress Notes (Signed)
Vista Lawman , M.D. Gastroenterology & Hepatology [facility] [address] [place], [place] [address] Primary Care Physician: Benita Stabile, MD [address] [place] [address]  Chief Complaint: Abdominal pain , constipation and history of peptic ulcer disease  History of Present Illness: Carolyn Gaines is a 57 y.o. female with CAD status post PCI and CABG, hypertension, fibromyalgia ,anxiety depression, DVT not on anticoagulation anymore who presents for evaluation of Abdominal pain , constipation and history of peptic ulcer disease  Patient reports that for past 2 to 3 days she has intermittent abdominal pain mostly located at left upper quadrant which is a stabbing-like sensation currently in the clinic she has 8 out of 10 pain nonradiating without any aggravating or relieving factors.  Patient takes tramadol for her chronic pain but denies taking any NSAIDs.  Patient reports a history of bleeding peptic ulcer disease when she was 57 year old. Diagnosed with DVT 01/25/21 and started on Eliquis.   The patient denies having any nausea, vomiting, fever, chills, hematochezia, melena, hematemesis,  diarrhea, jaundice, pruritus or weight loss.  Last EGD:2018  Normal esophagus. - Normal stomach. Biopsied. - Normal examined duodenum.   Last Colonoscopy:- The entire examined colon is normal on direct and retroflexion views. - No specimens collected.  Suggested repeat 10 years  Lab work normal liver enzymes hemoglobin 13 MCV 87 done on 01/2023  CT abdomen pelvis with contrast is ordered  FHx: neg for any gastrointestinal/liver disease, no malignancies Social: neg smoking, alcohol or illicit drug use Surgical:  cholecystectomy and prior hysterectomy.  Past Medical History: Past Medical History:  Diagnosis Date   Anemia    low iron   Anxiety    Arthritis    Bleeding ulcer    age 48. spent one week in hospital with bleeding  ulcers.   Coronary artery disease    a. s/p prior stenting to RCA b. CABG in 09/2016 with LIMA-LAD, SVG-OM and SVG-RCA c. low-risk NST in 01/2019   Depression    Fibromyalgia    Gallstones    GERD (gastroesophageal reflux disease)    Headache    migraines   MI (myocardial infarction) (HCC) 01/2016   Peptic ulcer    Seizures (HCC)    as a baby    Past Surgical History: Past Surgical History:  Procedure Laterality Date   ABDOMINAL HYSTERECTOMY     CARDIAC CATHETERIZATION N/A 02/19/2016   Procedure: Left Heart Cath and Coronary Angiography;  Surgeon: Yates Decamp, MD;  Location: [facility] INVASIVE CV LAB;  Service: Cardiovascular;  Laterality: N/A;   CARDIAC CATHETERIZATION N/A 02/19/2016   Procedure: Coronary Stent Intervention;  Surgeon: Yates Decamp, MD;  Location: [facility] INVASIVE CV LAB;  Service: Cardiovascular;  Laterality: N/A;   CESAREAN SECTION     x 2   CHOLECYSTECTOMY N/A 01/25/2017   Procedure: LAPAROSCOPIC CHOLECYSTECTOMY;  Surgeon: Franky Macho, MD;  Location: [facility] ORS;  Service: General;  Laterality: N/A;   COLONOSCOPY     CORONARY ARTERY BYPASS GRAFT N/A 10/26/2016   Procedure: CORONARY ARTERY BYPASS GRAFTING (CABG), ON PUMP, TIMES THREE, USING LEFT INTERNAL MAMMARY ARTERY AND ENDOSCOPICALLY HARVESTED RIGHT GREATER SAPHENOUS VEIN;  Surgeon: Alleen Borne, MD;  Location: [facility] OR;  Service: Open Heart Surgery;  Laterality: N/A;  LIMA-LAD SVG-RCA SVG-OM   ESOPHAGOGASTRODUODENOSCOPY     LEFT HEART CATH AND CORONARY ANGIOGRAPHY N/A 10/22/2016   Procedure: Left Heart Cath and Coronary Angiography;  Surgeon: Lennette Bihari, MD;  Location: [facility] INVASIVE CV LAB;  Service: Cardiovascular;  Laterality: N/A;   STERNAL WIRES REMOVAL N/A 07/11/2017   Procedure: STERNAL WIRES REMOVAL;  Surgeon: Alleen Borne, MD;  Location: [facility] OR;  Service: Thoracic;  Laterality: N/A;   TEE WITHOUT CARDIOVERSION N/A 10/26/2016   Procedure: TRANSESOPHAGEAL ECHOCARDIOGRAM (TEE);  Surgeon: Alleen Borne, MD;  Location: [facility]  OR;  Service: Open Heart Surgery;  Laterality: N/A;   WRIST FRACTURE SURGERY Left    3 times    Family History: Family History  Problem Relation Age of Onset   COPD Mother    Arthritis Mother    Heart disease Mother    Stroke Mother    Psoriasis Father    Heart disease Father    Diabetes Maternal Grandmother    Diabetes Maternal Aunt    Diabetes Maternal Aunt     Social History: Social History   Tobacco Use  Smoking Status Former   Current packs/day: 0.00   Types: Cigarettes   Quit date: 03/21/2011   Years since quitting: 12.0  Smokeless Tobacco Never   Social History   Substance and Sexual Activity  Alcohol Use Yes   Comment: rare   Social History   Substance and Sexual Activity  Drug Use No    Allergies: Allergies  Allergen Reactions   Bee Venom Other (See Comments)    Pus sites at injection   Elastic Bandages & [Zinc] Hives   Mango Flavor Hives    All mango   Neosporin [Bacitracin-Polymyxin B]    Adhesive [Tape] Itching and Swelling    Please use paper tape   Triple Antibiotic [Bacitracin-Neomycin-Polymyxin] Rash    Medications: Current Outpatient Medications  Medication Sig Dispense Refill   acetaminophen (TYLENOL) 500 MG tablet Take 1,000 mg by mouth every 6 (six) hours as needed for mild pain.     carvedilol (COREG) 3.125 MG tablet Take 1 tablet (3.125 mg total) by mouth 2 (two) times daily. 180 tablet 3   DULoxetine (CYMBALTA) 60 MG capsule Take 60 mg by mouth daily.     ezetimibe (ZETIA) 10 MG tablet Take 1 tablet (10 mg total) by mouth daily. 90 tablet 3   lidocaine (LIDODERM) 5 % 1 patch daily. As needed     nitroGLYCERIN (NITROSTAT) 0.4 MG SL tablet DISSOLVE 1 TABLET UNDER THE TONGUE EVERY 5 MINUTES FOR 3 DOSES AS NEEDED FOR CHEST PAIN 25 tablet 3   polyethylene glycol (MIRALAX / GLYCOLAX) 17 g packet Take 17 g by mouth 2 (two) times daily. 180 packet 0   pregabalin (LYRICA) 75 MG capsule Take 75 mg by mouth 2 (two) times daily.      psyllium (METAMUCIL) 58.6 % packet Take 1 packet by mouth 2 (two) times daily. 60 packet 2   rosuvastatin (CRESTOR) 40 MG tablet Take 1 tablet (40 mg total) by mouth every evening. 90 tablet 3   No current facility-administered medications for this visit.    Review of Systems: GENERAL: negative for malaise, night sweats HEENT: No changes in hearing or vision, no nose bleeds or other nasal problems. NECK: Negative for lumps, goiter, pain and significant neck swelling RESPIRATORY: Negative for cough, wheezing CARDIOVASCULAR: Negative for chest pain, leg swelling, palpitations, orthopnea GI: SEE HPI MUSCULOSKELETAL: Negative for joint pain or swelling, back pain, and muscle pain. SKIN: Negative for lesions, rash HEMATOLOGY Negative for prolonged bleeding, bruising easily, and swollen nodes. ENDOCRINE: Negative for cold or heat intolerance, polyuria, polydipsia and goiter. NEURO: negative for tremor, gait imbalance,  syncope and seizures. The remainder of the review of systems is noncontributory.   Physical Exam: BP 108/74 (BP Location: Left Arm, Patient Position: Sitting, Cuff Size: Large)   Pulse 80   Temp 98.9 F (37.2 C) (Oral)   Ht 5\' 3"  (1.6 m)   Wt 198 lb (89.8 kg)   BMI 35.07 kg/m  GENERAL: The patient is AO x3, in no acute distress. HEENT: Head is normocephalic and atraumatic. EOMI are intact. Mouth is well hydrated and without lesions. NECK: Supple. No masses LUNGS: Clear to auscultation. No presence of rhonchi/wheezing/rales. Adequate chest expansion HEART: RRR, normal s1 and s2. ABDOMEN: Soft, RUQ tenderness and Lower abdomen tenderness,no guarding, no peritoneal signs, and nondistended. BS +. No masses. EXTREMITIES: Without any cyanosis, clubbing, rash, lesions . 1+pitting edema , no asymmetry , redness, pain   NEUROLOGIC: AOx3, no focal motor deficit.    Imaging/Labs: as above     Latest Ref Rng & Units 01/25/2021    4:40 PM 11/10/2020    4:04 AM 11/09/2020     5:39 PM  CBC  WBC 4.0 - 10.5 K/uL 4.7  5.1  4.3   Hemoglobin 12.0 - 15.0 g/dL 01.0  27.2  53.6   Hematocrit 36.0 - 46.0 % 38.0  35.1  38.0   Platelets 150 - 400 K/uL 265  237  252    Lab Results  Component Value Date   IRON 54 10/20/2016   TIBC 220 (L) 10/20/2016   FERRITIN 38 10/20/2016    I personally reviewed and interpreted the available labs, imaging and endoscopic files.  2022 1. Subcentimeter bilateral lower lobe noncalcified lung nodules. Further evaluation with chest CT is recommended to determine the presence or absence of additional noncalcified lung nodules. 2. Evidence of prior cholecystectomy and prior hysterectomy. 3. No acute or active process within the abdomen or pelvis.    Impression and Plan:  Carolyn Gaines is a 57 y.o. female with CAD status post PCI and CABG, hypertension, fibromyalgia ,anxiety depression, DVT not on anticoagulation anymore who presents for evaluation of Abdominal pain , constipation and history of peptic ulcer disease  #Abdominal pain  #Peptic ulcer disease  Patient has significant abdominal pain mostly located left upper quadrant and on exam she has tenderness on left upper quadrant and bilateral lower quadrant as well.  Pain is not positional without any aggravating or relieving factors.  This could be peptic ulcer disease, diverticulitis, constipation induced functional pain  Patient last upper endoscopy 2018 without any ulcers but patient reports bleeding gastric ulcers when she was age 68  Given history of peptic ulcer disease and significant tenderness on exam reasonable to pursue upper endoscopy to rule out any underlying ulcers  Also patient has chronic constipation this could be diverticulitis and needs to be assessed with CT abdomen pelvis  #Chronic constipation  Patient has chronic constipation with bowel movement every 2 to 3 days with much straining only taking Dulcolax.  Recs: Will obtain abdominal x-ray to assess stool  burden if large amount is encountered would benefit from bowel purge at that time Ensure adequate fluid intake: Aim for 8 glasses of water daily. Follow a high fiber diet: Include foods such as dates, prunes, pears, and kiwi. Take Miralax twice a day for the first week, then reduce to once daily thereafter. Use Metamucil twice a day.  #History of DVT Patient is concerned for lower extremity DVTs given history of DVT in 2022 currently not on any anticoagulation.  On exam  she does not have bilateral lower extremity asymmetry pain or redness but has bilateral lower extremity trace edema Would recommend following with PCP for further evaluation  #BMI :35       - walking at a brisk pace/biking at moderate intesity 2.5-5 hours per week     - use pedometer/step counter to track activity     - goal to lose 5-10% of initial body weight     - avoid suagry drinks and juices, use zero calorie beverages     - increase water intake     - eat a low carb diet with plenty of veggies and fruit     - Get sufficient sleep 7-8 hrs nightly     - maitain active lifestyle     - avoid alcohol     - recommend 2-3 cups Coffee daily     - Counsel on lowering cholesterol by having a diet rich in vegetables,          protein (avoid red meats) and good fats(fish, salmon).   All questions were answered.      Vista Lawman, MD Gastroenterology and Hepatology [facility]   This chart has been completed using [facility] Dictation software, and while attempts have been made to ensure accuracy , certain words and phrases may not be transcribed as intended

## 2023-04-02 NOTE — Patient Instructions (Signed)
It was very nice to meet you today, as dicussed with will plan for the following :  1) Upper endoscopy  2) CT abdomen and pelvis  3) Get abdominal xray Ensure adequate fluid intake: Aim for 8 glasses of water daily. Follow a high fiber diet: Include foods such as dates, prunes, pears, and kiwi. Take Miralax twice a day for the first week, then reduce to once daily thereafter. Use Metamucil twice a day.

## 2023-04-03 ENCOUNTER — Encounter (INDEPENDENT_AMBULATORY_CARE_PROVIDER_SITE_OTHER): Payer: Self-pay

## 2023-04-08 ENCOUNTER — Telehealth (INDEPENDENT_AMBULATORY_CARE_PROVIDER_SITE_OTHER): Payer: Self-pay | Admitting: Gastroenterology

## 2023-04-08 MED ORDER — PEG 3350-KCL-NA BICARB-NACL 420 G PO SOLR: 4000.0000 mL | Freq: Once | ORAL | 0 refills | Status: AC

## 2023-04-08 NOTE — Telephone Encounter (Signed)
Correction: PA needed for EGD not TCS.  Will redo PA since the fax came back with TCS code and not EGD

## 2023-04-08 NOTE — Telephone Encounter (Signed)
No PA needed for Colonoscopy. Still waiting for CT decision

## 2023-04-08 NOTE — Telephone Encounter (Signed)
PA faxed to [employer] on 04/02/23; contacted [employer] to check on status. Rep states they have not received any faxes for patient. Will resubmit.

## 2023-04-08 NOTE — Addendum Note (Signed)
Addended by: Vista Lawman on: 04/08/2023 03:18 PM   Modules accepted: Orders

## 2023-04-08 NOTE — Telephone Encounter (Signed)
Also sent PA form for CT

## 2023-04-09 NOTE — Telephone Encounter (Signed)
Fax from [employer] stating no PA needed for either CT or EGD since being performed in an OP setting is facility is par.

## 2023-04-11 NOTE — Telephone Encounter (Signed)
Pt left voicemail in regards to getting xray sent to insurance company to get CT approved. CT ordered by PCP has been denied. Original fax that I received stated no pre cert needed; upon completing CPT search on [employer], CT does require PA.   Correction: PA is required for CT. Ct PA completed via [employer]-awaiting decision

## 2023-04-12 ENCOUNTER — Encounter (INDEPENDENT_AMBULATORY_CARE_PROVIDER_SITE_OTHER): Payer: Self-pay

## 2023-04-12 ENCOUNTER — Telehealth (INDEPENDENT_AMBULATORY_CARE_PROVIDER_SITE_OTHER): Payer: Self-pay | Admitting: Gastroenterology

## 2023-04-12 NOTE — Telephone Encounter (Signed)
CT approved via [employer]. Tracking # [id number]; Request ID: [id number]. Contacted Central Scheduling and pt is scheduled for 04/23/23 at 2:30pm. Pt is to arrive by 12:30pm to begin drinking oral contrast. Pt contacted and verbalized understanding. Appt letter will also be mailed to patient.

## 2023-04-12 NOTE — Telephone Encounter (Signed)
Error

## 2023-04-15 ENCOUNTER — Ambulatory Visit (HOSPITAL_COMMUNITY): Payer: Medicaid Other

## 2023-04-15 NOTE — Patient Instructions (Signed)
Carolyn Gaines  04/15/2023     @PREFPERIOPPHARMACY @   Your procedure is scheduled on  04/18/2023.   Report to [facility] at  1200  P.M.   Call this number if you have problems the morning of surgery:  [phone]  If you experience any cold or flu symptoms such as cough, fever, chills, shortness of breath, etc. between now and your scheduled surgery, please notify us at the above number.   Remember:  Follow the diet instructions given to you by the office.     Take these medicines the morning of surgery with A SIP OF WATER                   carvedilol, duloxetine, pregabalin     Do not wear jewelry, make-up or nail polish, including gel polish,  artificial nails, or any other type of covering on natural nails (fingers and  toes).  Do not wear lotions, powders, or perfumes, or deodorant.  Do not shave 48 hours prior to surgery.  Men may shave face and neck.  Do not bring valuables to the hospital.  [facility] is not responsible for any belongings or valuables.  Contacts, dentures or bridgework may not be worn into surgery.  Leave your suitcase in the car.  After surgery it may be brought to your room.  For patients admitted to the hospital, discharge time will be determined by your treatment team.  Patients discharged the day of surgery will not be allowed to drive home and must have someone with them for 24 hours.   Special instructions:  DO NOT smoke tobacco or vape for 24 hours before your procedure.   Please read over the following fact sheets that you were given. Anesthesia Post-op Instructions and Care and Recovery After Surgery      Upper Endoscopy, Adult, Care After After the procedure, it is common to have a sore throat. It is also common to have: Mild stomach pain or discomfort. Bloating. Nausea. Follow these instructions at home: The instructions below may help you care for yourself at home. Your health care provider may give you more  instructions. If you have questions, ask your health care provider. If you were given a sedative during the procedure, it can affect you for several hours. Do not drive or operate machinery until your health care provider says that it is safe. If you will be going home right after the procedure, plan to have a responsible adult: Take you home from the hospital or clinic. You will not be allowed to drive. Care for you for the time you are told. Follow instructions from your health care provider about what you may eat and drink. Return to your normal activities as told by your health care provider. Ask your health care provider what activities are safe for you. Take over-the-counter and prescription medicines only as told by your health care provider. Contact a health care provider if you: Have a sore throat that lasts longer than one day. Have trouble swallowing. Have a fever. Get help right away if you: Vomit blood or your vomit looks like coffee grounds. Have bloody, black, or tarry stools. Have a very bad sore throat or you cannot swallow. Have difficulty breathing or very bad pain in your chest or abdomen. These symptoms may be an emergency. Get help right away. Call 911. Do not wait to see if the symptoms will go away. Do not drive yourself to the  hospital. Summary After the procedure, it is common to have a sore throat, mild stomach discomfort, bloating, and nausea. If you were given a sedative during the procedure, it can affect you for several hours. Do not drive until your health care provider says that it is safe. Follow instructions from your health care provider about what you may eat and drink. Return to your normal activities as told by your health care provider. This information is not intended to replace advice given to you by your health care provider. Make sure you discuss any questions you have with your health care provider. Document Revised: 10/25/2021 Document Reviewed:  10/25/2021 Elsevier Patient Education  2024 [employer]. Monitored Anesthesia Care, Care After The following information offers guidance on how to care for yourself after your procedure. Your health care provider may also give you more specific instructions. If you have problems or questions, contact your health care provider. What can I expect after the procedure? After the procedure, it is common to have: Tiredness. Little or no memory about what happened during or after the procedure. Impaired judgment when it comes to making decisions. Nausea or vomiting. Some trouble with balance. Follow these instructions at home: For the time period you were told by your health care provider:  Rest. Do not participate in activities where you could fall or become injured. Do not drive or use machinery. Do not drink alcohol. Do not take sleeping pills or medicines that cause drowsiness. Do not make important decisions or sign legal documents. Do not take care of children on your own. Medicines Take over-the-counter and prescription medicines only as told by your health care provider. If you were prescribed antibiotics, take them as told by your health care provider. Do not stop using the antibiotic even if you start to feel better. Eating and drinking Follow instructions from your health care provider about what you may eat and drink. Drink enough fluid to keep your urine pale yellow. If you vomit: Drink clear fluids slowly and in small amounts as you are able. Clear fluids include water, ice chips, low-calorie sports drinks, and fruit juice that has water added to it (diluted fruit juice). Eat light and bland foods in small amounts as you are able. These foods include bananas, applesauce, rice, lean meats, toast, and crackers. General instructions  Have a responsible adult stay with you for the time you are told. It is important to have someone help care for you until you are awake and  alert. If you have sleep apnea, surgery and some medicines can increase your risk for breathing problems. Follow instructions from your health care provider about wearing your sleep device: When you are sleeping. This includes during daytime naps. While taking prescription pain medicines, sleeping medicines, or medicines that make you drowsy. Do not use any products that contain nicotine or tobacco. These products include cigarettes, chewing tobacco, and vaping devices, such as e-cigarettes. If you need help quitting, ask your health care provider. Contact a health care provider if: You feel nauseous or vomit every time you eat or drink. You feel light-headed. You are still sleepy or having trouble with balance after 24 hours. You get a rash. You have a fever. You have redness or swelling around the IV site. Get help right away if: You have trouble breathing. You have new confusion after you get home. These symptoms may be an emergency. Get help right away. Call 911. Do not wait to see if the symptoms will go away.  Do not drive yourself to the hospital. This information is not intended to replace advice given to you by your health care provider. Make sure you discuss any questions you have with your health care provider. Document Revised: 12/11/2021 Document Reviewed: 12/11/2021 Elsevier Patient Education  2024 [employer].

## 2023-04-16 ENCOUNTER — Encounter (HOSPITAL_COMMUNITY)
Admission: RE | Admit: 2023-04-16 | Discharge: 2023-04-16 | Disposition: A | Discharge status: Home / Self Care | Payer: Medicaid Other | Source: Ambulatory Visit | Attending: Gastroenterology | Admitting: Gastroenterology

## 2023-04-16 ENCOUNTER — Other Ambulatory Visit: Payer: Self-pay

## 2023-04-16 ENCOUNTER — Encounter (HOSPITAL_COMMUNITY): Payer: Self-pay

## 2023-04-16 VITALS — BP 108/74 | HR 80 | Temp 98.5°F | Resp 18 | Ht 63.0 in | Wt 198.0 lb

## 2023-04-16 DIAGNOSIS — D649 Anemia, unspecified: Secondary | ICD-10-CM | POA: Diagnosis not present

## 2023-04-16 DIAGNOSIS — Z01810 Encounter for preprocedural cardiovascular examination: Secondary | ICD-10-CM | POA: Diagnosis not present

## 2023-04-16 DIAGNOSIS — Z6835 Body mass index (BMI) 35.0-35.9, adult: Secondary | ICD-10-CM | POA: Diagnosis present

## 2023-04-16 DIAGNOSIS — I10 Essential (primary) hypertension: Secondary | ICD-10-CM | POA: Insufficient documentation

## 2023-04-16 DIAGNOSIS — Z01818 Encounter for other preprocedural examination: Secondary | ICD-10-CM | POA: Diagnosis present

## 2023-04-16 DIAGNOSIS — Z01812 Encounter for preprocedural laboratory examination: Secondary | ICD-10-CM | POA: Insufficient documentation

## 2023-04-16 HISTORY — DX: Dyspnea, unspecified: R06.00

## 2023-04-16 LAB — CBC WITH DIFFERENTIAL/PLATELET
Abs Immature Granulocytes: 0.01 10*3/uL (ref 0.00–0.07)
Basophils Absolute: 0 10*3/uL (ref 0.0–0.1)
Basophils Relative: 0 %
Eosinophils Absolute: 0.2 10*3/uL (ref 0.0–0.5)
Eosinophils Relative: 5 %
HCT: 39 % (ref 36.0–46.0)
Hemoglobin: 12.3 g/dL (ref 12.0–15.0)
Immature Granulocytes: 0 %
Lymphocytes Relative: 29 %
Lymphs Abs: 1.3 10*3/uL (ref 0.7–4.0)
MCH: 28.7 pg (ref 26.0–34.0)
MCHC: 31.5 g/dL (ref 30.0–36.0)
MCV: 91.1 fL (ref 80.0–100.0)
Monocytes Absolute: 0.3 10*3/uL (ref 0.1–1.0)
Monocytes Relative: 6 %
Neutro Abs: 2.7 10*3/uL (ref 1.7–7.7)
Neutrophils Relative %: 60 %
Platelets: 265 10*3/uL (ref 150–400)
RBC: 4.28 MIL/uL (ref 3.87–5.11)
RDW: 14 % (ref 11.5–15.5)
WBC: 4.6 10*3/uL (ref 4.0–10.5)
nRBC: 0 % (ref 0.0–0.2)

## 2023-04-18 ENCOUNTER — Encounter (HOSPITAL_COMMUNITY): Payer: Self-pay

## 2023-04-18 ENCOUNTER — Other Ambulatory Visit (INDEPENDENT_AMBULATORY_CARE_PROVIDER_SITE_OTHER): Payer: Self-pay | Admitting: Gastroenterology

## 2023-04-18 ENCOUNTER — Ambulatory Visit (HOSPITAL_COMMUNITY): Payer: Medicaid Other | Admitting: Certified Registered"

## 2023-04-18 ENCOUNTER — Encounter (HOSPITAL_COMMUNITY)
Admission: RE | Disposition: A | Discharge status: Home / Self Care | Payer: Self-pay | Source: Ambulatory Visit | Attending: Gastroenterology

## 2023-04-18 ENCOUNTER — Ambulatory Visit (HOSPITAL_COMMUNITY)
Admission: RE | Admit: 2023-04-18 | Discharge: 2023-04-18 | Disposition: A | Discharge status: Home / Self Care | Payer: Medicaid Other | Source: Ambulatory Visit | Attending: Gastroenterology | Admitting: Gastroenterology

## 2023-04-18 ENCOUNTER — Ambulatory Visit (HOSPITAL_BASED_OUTPATIENT_CLINIC_OR_DEPARTMENT_OTHER): Payer: Medicaid Other | Admitting: Certified Registered"

## 2023-04-18 DIAGNOSIS — I2510 Atherosclerotic heart disease of native coronary artery without angina pectoris: Secondary | ICD-10-CM

## 2023-04-18 DIAGNOSIS — Z7901 Long term (current) use of anticoagulants: Secondary | ICD-10-CM | POA: Diagnosis not present

## 2023-04-18 DIAGNOSIS — F32A Depression, unspecified: Secondary | ICD-10-CM | POA: Diagnosis not present

## 2023-04-18 DIAGNOSIS — Z87891 Personal history of nicotine dependence: Secondary | ICD-10-CM | POA: Insufficient documentation

## 2023-04-18 DIAGNOSIS — K200 Eosinophilic esophagitis: Secondary | ICD-10-CM

## 2023-04-18 DIAGNOSIS — Z86718 Personal history of other venous thrombosis and embolism: Secondary | ICD-10-CM | POA: Insufficient documentation

## 2023-04-18 DIAGNOSIS — K3189 Other diseases of stomach and duodenum: Secondary | ICD-10-CM | POA: Insufficient documentation

## 2023-04-18 DIAGNOSIS — K209 Esophagitis, unspecified without bleeding: Secondary | ICD-10-CM

## 2023-04-18 DIAGNOSIS — F419 Anxiety disorder, unspecified: Secondary | ICD-10-CM | POA: Insufficient documentation

## 2023-04-18 DIAGNOSIS — Z951 Presence of aortocoronary bypass graft: Secondary | ICD-10-CM | POA: Diagnosis not present

## 2023-04-18 DIAGNOSIS — D269 Other benign neoplasm of uterus, unspecified: Secondary | ICD-10-CM

## 2023-04-18 DIAGNOSIS — R1013 Epigastric pain: Secondary | ICD-10-CM | POA: Insufficient documentation

## 2023-04-18 DIAGNOSIS — K2289 Other specified disease of esophagus: Secondary | ICD-10-CM | POA: Insufficient documentation

## 2023-04-18 DIAGNOSIS — M797 Fibromyalgia: Secondary | ICD-10-CM | POA: Insufficient documentation

## 2023-04-18 DIAGNOSIS — I10 Essential (primary) hypertension: Secondary | ICD-10-CM

## 2023-04-18 DIAGNOSIS — K269 Duodenal ulcer, unspecified as acute or chronic, without hemorrhage or perforation: Secondary | ICD-10-CM | POA: Insufficient documentation

## 2023-04-18 DIAGNOSIS — K5909 Other constipation: Secondary | ICD-10-CM | POA: Insufficient documentation

## 2023-04-18 DIAGNOSIS — G8929 Other chronic pain: Secondary | ICD-10-CM | POA: Diagnosis not present

## 2023-04-18 DIAGNOSIS — K254 Chronic or unspecified gastric ulcer with hemorrhage: Secondary | ICD-10-CM | POA: Diagnosis not present

## 2023-04-18 DIAGNOSIS — I252 Old myocardial infarction: Secondary | ICD-10-CM | POA: Insufficient documentation

## 2023-04-18 DIAGNOSIS — K2970 Gastritis, unspecified, without bleeding: Secondary | ICD-10-CM

## 2023-04-18 DIAGNOSIS — K2950 Unspecified chronic gastritis without bleeding: Secondary | ICD-10-CM

## 2023-04-18 HISTORY — PX: BIOPSY: SHX5522

## 2023-04-18 HISTORY — PX: ESOPHAGOGASTRODUODENOSCOPY (EGD) WITH PROPOFOL: SHX5813

## 2023-04-18 SURGERY — ESOPHAGOGASTRODUODENOSCOPY (EGD) WITH PROPOFOL
Anesthesia: General

## 2023-04-18 MED ORDER — LACTATED RINGERS IV SOLN: INTRAVENOUS | Status: DC

## 2023-04-18 MED ORDER — PANTOPRAZOLE SODIUM 40 MG PO TBEC: 40.0000 mg | DELAYED_RELEASE_TABLET | Freq: Every day | ORAL | 1 refills | Status: DC

## 2023-04-18 MED ORDER — PROPOFOL 500 MG/50ML IV EMUL
INTRAVENOUS | Status: DC | PRN
  Administered 2023-04-18: 150 ug/kg/min via INTRAVENOUS

## 2023-04-18 MED ORDER — PROPOFOL 10 MG/ML IV BOLUS
INTRAVENOUS | Status: DC | PRN
  Administered 2023-04-18: 50 mg via INTRAVENOUS
  Administered 2023-04-18: 100 mg via INTRAVENOUS

## 2023-04-18 MED ORDER — LIDOCAINE HCL (CARDIAC) PF 100 MG/5ML IV SOSY
PREFILLED_SYRINGE | INTRAVENOUS | Status: DC | PRN
  Administered 2023-04-18: 50 mg via INTRAVENOUS

## 2023-04-18 NOTE — Interval H&P Note (Signed)
History and Physical Interval Note:  04/18/2023 1:27 PM  Carolyn Gaines  has presented today for surgery, with the diagnosis of abdominal pain.  The various methods of treatment have been discussed with the patient and family. After consideration of risks, benefits and other options for treatment, the patient has consented to  Procedure(s) with comments: ESOPHAGOGASTRODUODENOSCOPY (EGD) WITH PROPOFOL (N/A) - 2:15pm;asa 3, LM to see if pt can move up as a surgical intervention.  [name] history has been reviewed, patient examined, no change in status, stable for surgery.  I have reviewed the patient's chart and labs.  Questions were answered to the patient's satisfaction.     Juanetta Beets Olive Motyka

## 2023-04-18 NOTE — Discharge Instructions (Signed)

## 2023-04-18 NOTE — Transfer of Care (Signed)
Immediate Anesthesia Transfer of Care Note  Patient: Carolyn Gaines  Procedure(s) Performed: ESOPHAGOGASTRODUODENOSCOPY (EGD) WITH PROPOFOL BIOPSY  Patient Location: Short Stay  Anesthesia Type:General  Level of Consciousness: drowsy  Airway & Oxygen Therapy: Patient Spontanous Breathing and Patient connected to nasal cannula oxygen  Post-op Assessment: Report given to RN and Post -op Vital signs reviewed and stable  Post vital signs: Reviewed and stable  Last Vitals:  Vitals Value Taken Time  BP    Temp    Pulse    Resp    SpO2      Last Pain:  Vitals:   04/18/23 1332  TempSrc:   PainSc: 0-No pain         Complications: No notable events documented.

## 2023-04-18 NOTE — Anesthesia Procedure Notes (Signed)
Date/Time: 04/18/2023 1:33 PM  Performed by: Julian Reil, CRNAPre-anesthesia Checklist: Patient identified, Emergency Drugs available, Suction available and Patient being monitored Patient Re-evaluated:Patient Re-evaluated prior to induction Oxygen Delivery Method: Nasal cannula Induction Type: IV induction Placement Confirmation: positive ETCO2

## 2023-04-18 NOTE — Anesthesia Preprocedure Evaluation (Signed)
Anesthesia Evaluation  Patient identified by MRN, date of birth, ID band Patient awake    Reviewed: Allergy & Precautions, H&P , NPO status , Patient's Chart, lab work & pertinent test results, reviewed documented beta blocker date and time   Airway Mallampati: II  TM Distance: >3 FB Neck ROM: full    Dental no notable dental hx.    Pulmonary neg pulmonary ROS, shortness of breath, former smoker   Pulmonary exam normal breath sounds clear to auscultation       Cardiovascular Exercise Tolerance: Good hypertension, + angina  + CAD and + Past MI  negative cardio ROS  Rhythm:regular Rate:Normal     Neuro/Psych  Headaches, Seizures -,  PSYCHIATRIC DISORDERS Anxiety Depression     Neuromuscular disease negative neurological ROS  negative psych ROS   GI/Hepatic negative GI ROS, Neg liver ROS, PUD,GERD  ,,  Endo/Other  negative endocrine ROS    Renal/GU negative Renal ROS  negative genitourinary   Musculoskeletal   Abdominal   Peds  Hematology negative hematology ROS (+) Blood dyscrasia, anemia   Anesthesia Other Findings   Reproductive/Obstetrics negative OB ROS                             Anesthesia Physical Anesthesia Plan  ASA: 3  Anesthesia Plan: General   Post-op Pain Management:    Induction:   PONV Risk Score and Plan: Propofol infusion  Airway Management Planned:   Additional Equipment:   Intra-op Plan:   Post-operative Plan:   Informed Consent: I have reviewed the patients History and Physical, chart, labs and discussed the procedure including the risks, benefits and alternatives for the proposed anesthesia with the patient or authorized representative who has indicated his/her understanding and acceptance.     Dental Advisory Given  Plan Discussed with: CRNA  Anesthesia Plan Comments:        Anesthesia Quick Evaluation

## 2023-04-18 NOTE — Op Note (Signed)
[facility] Patient Name: Carolyn Gaines Procedure Date: 04/18/2023 1:22 PM MRN: [id number] Date of Birth: September 16, 1965 Attending MD: Sanjuan Dame , MD, [id number] CSN: [id number] Age: 57 Admit Type: Outpatient Procedure:                Upper GI endoscopy Indications:              Epigastric abdominal pain Providers:                Sanjuan Dame, MD, Angelica Ran, Lennice Sites                            Technician, Technician Referring MD:              Medicines:                Monitored Anesthesia Care Complications:            No immediate complications. Estimated Blood Loss:     Estimated blood loss was minimal. Procedure:                Pre-Anesthesia Assessment:                           - Prior to the procedure, a History and Physical                            was performed, and patient medications and                            allergies were reviewed. [name] tolerance of                            previous anesthesia was also reviewed. The risks                            and benefits of the procedure and the sedation                            options and risks were discussed with the patient.                            All questions were answered, and informed consent                            was obtained. Prior Anticoagulants: The patient has                            taken no anticoagulant or antiplatelet agents. ASA                            Grade Assessment: III - A patient with severe                            systemic disease. After reviewing the risks and  benefits, the patient was deemed in satisfactory                            condition to undergo the procedure.                           After obtaining informed consent, the endoscope was                            passed under direct vision. Throughout the                            procedure, the patient's blood pressure, pulse, and                            oxygen  saturations were monitored continuously. The                            [id number]-H190 ([id number]) scope was introduced through the                            mouth, and advanced to the second part of duodenum.                            The upper GI endoscopy was accomplished without                            difficulty. The patient tolerated the procedure                            well. Scope In: 1:38:00 PM Scope Out: 1:49:03 PM Total Procedure Duration: 0 hours 11 minutes 3 seconds  Findings:      Mucosal changes including ringed esophagus and feline appearance were       found in the entire esophagus. Biopsies were obtained from the proximal       and distal esophagus with cold forceps for histology of suspected       eosinophilic esophagitis.      Mucosal changes characterized by nodularity were found at the       gastroesophageal junction. Biopsies were taken with a cold forceps for       histology.      Erythematous mucosa without bleeding was found in the entire examined       stomach. Biopsies were taken with a cold forceps for histology.      Two non-bleeding duodenal ulcers with a clean ulcer base (Forrest Class       III) were found in the duodenal bulb. The largest lesion was 7 mm in       largest dimension. Impression:               - Esophageal mucosal changes suspicious for                            eosinophilic esophagitis.                           -  Nodular mucosa in the esophagus. Biopsied.                           - Erythematous mucosa in the stomach. Biopsied.                           - Non-bleeding duodenal ulcers with a clean ulcer                            base (Forrest Class III).                           - Biopsies were taken with a cold forceps for                            evaluation of eosinophilic esophagitis. Moderate Sedation:      Per Anesthesia Care Recommendation:           - Patient has a contact number available for                             emergencies. The signs and symptoms of potential                            delayed complications were discussed with the                            patient. Return to normal activities tomorrow.                            Written discharge instructions were provided to the                            patient.                           - Resume previous diet.                           - Post procedure medication orders were given.                           - Await pathology results. Procedure Code(s):        --- Professional ---                           [phone], Esophagogastroduodenoscopy, flexible,                            transoral; with biopsy, single or multiple Diagnosis Code(s):        --- Professional ---                           K22.89, Other specified disease of esophagus                           K31.89,  Other diseases of stomach and duodenum                           K26.9, Duodenal ulcer, unspecified as acute or                            chronic, without hemorrhage or perforation                           R10.13, Epigastric pain CPT copyright 2022 American Medical Association. All rights reserved. The codes documented in this report are preliminary and upon coder review may  be revised to meet current compliance requirements. Sanjuan Dame, MD Sanjuan Dame, MD 04/18/2023 2:09:22 PM This report has been signed electronically. Number of Addenda: 0

## 2023-04-18 NOTE — OR Nursing (Signed)
While doing suicide assessment score, pt states that she sometimes wishes she could fall asleep and not wake up. Discussed this further with pt, she states she has no plans to harm herself and has no intentions of carrying out anything. Pt cites decline in health, loss of finances, and passing of spouse a few years ago as reasons for depression and these occasional feelings. Offered pt support services and financial services. She declined all services offered. Pt encouraged to seek help if these thoughts and feelings continue or increase. Pt verbalized understanding and agrees with plan.

## 2023-04-19 NOTE — Telephone Encounter (Signed)
30 day supply with one refill sent in on 9/19. Patient is requesting a 90 day supply.

## 2023-04-22 LAB — SURGICAL PATHOLOGY

## 2023-04-22 NOTE — Anesthesia Postprocedure Evaluation (Signed)
Anesthesia Post Note  Patient: Carolyn Gaines  Procedure(s) Performed: ESOPHAGOGASTRODUODENOSCOPY (EGD) WITH PROPOFOL BIOPSY  Patient location during evaluation: Phase II Anesthesia Type: General Level of consciousness: awake Pain management: pain level controlled Vital Signs Assessment: post-procedure vital signs reviewed and stable Respiratory status: spontaneous breathing and respiratory function stable Cardiovascular status: blood pressure returned to baseline and stable Postop Assessment: no headache and no apparent nausea or vomiting Anesthetic complications: no Comments: Late entry   No notable events documented.   Last Vitals:  Vitals:   04/18/23 1234 04/18/23 1353  BP: 112/70 115/61  Pulse: 75 80  Resp: 16 12  Temp: 36.9 C 37.1 C  SpO2: 95% 93%    Last Pain:  Vitals:   04/18/23 1353  TempSrc: Axillary  PainSc: 0-No pain                 Windell Norfolk

## 2023-04-23 ENCOUNTER — Other Ambulatory Visit (HOSPITAL_COMMUNITY): Payer: Self-pay | Admitting: Gastroenterology

## 2023-04-23 ENCOUNTER — Encounter (HOSPITAL_COMMUNITY): Payer: Self-pay | Admitting: Radiology

## 2023-04-23 ENCOUNTER — Ambulatory Visit (HOSPITAL_COMMUNITY)
Admission: RE | Admit: 2023-04-23 | Discharge: 2023-04-23 | Disposition: A | Discharge status: Home / Self Care | Payer: Medicaid Other | Source: Ambulatory Visit | Attending: Gastroenterology | Admitting: Gastroenterology

## 2023-04-23 DIAGNOSIS — R1012 Left upper quadrant pain: Secondary | ICD-10-CM | POA: Diagnosis present

## 2023-04-23 MED ORDER — IOHEXOL 300 MG/ML  SOLN
100.0000 mL | Freq: Once | INTRAMUSCULAR | Status: AC | PRN
  Administered 2023-04-23: 100 mL via INTRAVENOUS

## 2023-04-23 MED ORDER — PANTOPRAZOLE SODIUM 40 MG PO TBEC: 40.0000 mg | DELAYED_RELEASE_TABLET | Freq: Two times a day (BID) | ORAL | 5 refills | Status: DC

## 2023-04-23 NOTE — Progress Notes (Signed)
I reviewed the pathology results. [name], can you send her a letter with the findings as described below please? Repeat Upper endoscopy in 3 months  Thanks,  Vista Lawman, MD Gastroenterology and Hepatology [facility]  ---------------------------------------------------------------------------------------------  [facility] 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 This build-up can lead to symptoms such as difficulty swallowing, food getting stuck, and chest pain. EOE is often related to food allergies or other environmental factors. Treatment typically involves dietary changes, such as eliminating specific allergens, and medications like proton pump inhibitors or topical steroids to reduce inflammation and manage symptoms. We will work closely with you to develop a personalized treatment plan to help you manage this condition effectively.  I Recommend eight-week course of treatment of Protonix 40mg  twice daily . We recommend performing upper endoscopy after 8 to 12 weeks of therapy to assess endoscopic and histologic response.  Please continue to see [place] in the clinic   Please call us at [phone] if you have persistent problems or have questions about your condition that have not been fully answered at this time.  Sincerely,  Vista Lawman, MD Gastroenterology and Hepatology

## 2023-04-26 ENCOUNTER — Encounter (INDEPENDENT_AMBULATORY_CARE_PROVIDER_SITE_OTHER): Payer: Self-pay | Admitting: *Deleted

## 2023-04-29 ENCOUNTER — Encounter (HOSPITAL_COMMUNITY): Payer: Self-pay | Admitting: Gastroenterology

## 2023-04-29 NOTE — Addendum Note (Signed)
Addended by: Vista Lawman on: 04/29/2023 04:05 PM   Modules accepted: Orders

## 2023-04-29 NOTE — Progress Notes (Signed)
Tanya: can you please schedule this MRE Abdomen( ordered)  Hi Toniann Fail ,  Can you please call the patient and tell the patient the CT Scan shows possible inflammation of the small bowel .  I will order MRE Abdomen to evaluate small bowel  I advise patient to continue following up with [place] in the clinic  Thanks,  Vista Lawman, MD Gastroenterology and Hepatology [facility]astroenter[facility]  ========  CT concerning for IBD, EGD with biopsies with EOE , those eosinophilia could be related to IBD  Need MRE, CRP, Fecal Calpro

## 2023-05-03 ENCOUNTER — Ambulatory Visit (HOSPITAL_COMMUNITY)
Admission: RE | Admit: 2023-05-03 | Discharge: 2023-05-03 | Disposition: A | Discharge status: Home / Self Care | Payer: Medicaid Other | Source: Ambulatory Visit | Attending: Cardiology

## 2023-05-03 ENCOUNTER — Ambulatory Visit: Payer: Medicaid Other | Attending: Cardiology | Admitting: Cardiology

## 2023-05-03 ENCOUNTER — Encounter: Payer: Self-pay | Admitting: Cardiology

## 2023-05-03 VITALS — BP 128/78 | HR 84 | Ht 64.0 in | Wt 199.6 lb

## 2023-05-03 DIAGNOSIS — E782 Mixed hyperlipidemia: Secondary | ICD-10-CM

## 2023-05-03 DIAGNOSIS — R0602 Shortness of breath: Secondary | ICD-10-CM

## 2023-05-03 DIAGNOSIS — I10 Essential (primary) hypertension: Secondary | ICD-10-CM | POA: Diagnosis not present

## 2023-05-03 DIAGNOSIS — I824Y9 Acute embolism and thrombosis of unspecified deep veins of unspecified proximal lower extremity: Secondary | ICD-10-CM

## 2023-05-03 DIAGNOSIS — M79605 Pain in left leg: Secondary | ICD-10-CM

## 2023-05-03 DIAGNOSIS — I2510 Atherosclerotic heart disease of native coronary artery without angina pectoris: Secondary | ICD-10-CM | POA: Diagnosis not present

## 2023-05-03 MED ORDER — ASPIRIN 81 MG PO TBEC: 81.0000 mg | DELAYED_RELEASE_TABLET | Freq: Every day | ORAL | 3 refills | Status: AC

## 2023-05-03 NOTE — Progress Notes (Signed)
Clinical Summary Carolyn Gaines is a 57 y.o.female seen today for follow up of the following medical problems.   1. 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 Residual LAD disease 60-70% managed medically. - From Dr Verl Dicker notes nuclear stress 03/2016 (after stent) with mild septal ischemia, intermediate risk. - 09/2016 cath with 90% ostial LM. S/p cabg 10/16/16 wth LIMA-LAD, SVG-OM, SVG-RCA - 09/2016 echo LVEF 55-60%, no WMAs  - Has not been on ACE-I due to soft bp's    history of chronic chest wall pain since CABG, Did not tolerate gabapentin. Did have sternal wires removed 07/11/2017 with some improvement   01/2019 nuclear stress no ischemia   normal Lexiscan Myoview 11/10/2020   - DOE started about 3 months ago. For example walking up 1 flight of stairs, mild chest discomfort. Pins/needles like feeling which she uses lidocaine patch - some recent LE edema. Mild orthopnea - 02/2022 LVEF 60-65%, normal diastolic function, normal RV - knee surgery about 1 year ago, limited activities.  - weight is up 23 lbs since last year.       2. Hyperlipidemia - had been off statin for some time, now just restarted a few weeks ago - 01/2023 TC171 TG 153 HDL 46 LDL 98 - she is on crestor 40, zetia 10. not interested in pcsk9i   3. History of DVT - diagnosed 12/2020 - managed by pcp  Soreness left leg/calf, started about 2-3 months - feels like prior pain from DVT     4. Carotid US 2018 RICA 1-39%  SH: husband recently passed away, had been ill for long time. She has started hiking, doing things on her "bucket list"  Works at pharmacy.   Past Medical History:  Diagnosis Date   Anemia    low iron   Anxiety    Arthritis    Bleeding ulcer    age 21. spent one week in hospital with bleeding ulcers.   Coronary artery disease    a. s/p prior stenting to RCA b. CABG in 09/2016 with LIMA-LAD, SVG-OM and SVG-RCA c. low-risk NST in 01/2019   Depression    Dyspnea     Fibromyalgia    Gallstones    GERD (gastroesophageal reflux disease)    Headache    migraines   MI (myocardial infarction) (HCC) 01/2016   Peptic ulcer    Seizures (HCC)    as a baby     Allergies  Allergen Reactions   Bee Venom Other (See Comments)    Pus sites at injection   Elastic Bandages & [Zinc] Hives   Mango Flavor Hives    All mango   Neosporin [Bacitracin-Polymyxin B]     Pt states she can use this medication    Adhesive [Tape] Itching and Swelling    Please use paper tape   Triple Antibiotic [Bacitracin-Neomycin-Polymyxin] Rash     Current Outpatient Medications  Medication Sig Dispense Refill   acetaminophen (TYLENOL) 500 MG tablet Take 1,000 mg by mouth every 6 (six) hours as needed for mild pain.     aspirin EC 81 MG tablet Take 1 tablet (81 mg total) by mouth daily. Swallow whole. 90 tablet 3   carvedilol (COREG) 3.125 MG tablet Take 1 tablet (3.125 mg total) by mouth 2 (two) times daily. 180 tablet 3   DULoxetine (CYMBALTA) 60 MG capsule Take 60 mg by mouth daily.     ezetimibe (ZETIA) 10 MG tablet Take 1 tablet (10  mg total) by mouth daily. 90 tablet 3   lidocaine (LIDODERM) 5 % 1 patch daily. As needed     nitroGLYCERIN (NITROSTAT) 0.4 MG SL tablet DISSOLVE 1 TABLET UNDER THE TONGUE EVERY 5 MINUTES FOR 3 DOSES AS NEEDED FOR CHEST PAIN 25 tablet 3   pantoprazole (PROTONIX) 40 MG tablet Take 1 tablet (40 mg total) by mouth 2 (two) times daily. 60 tablet 5   polyethylene glycol (MIRALAX / GLYCOLAX) 17 g packet Take 17 g by mouth 2 (two) times daily. 180 packet 0   pregabalin (LYRICA) 75 MG capsule Take 75 mg by mouth 2 (two) times daily.     psyllium (METAMUCIL) 58.6 % packet Take 1 packet by mouth 2 (two) times daily. 60 packet 2   rosuvastatin (CRESTOR) 40 MG tablet Take 1 tablet (40 mg total) by mouth every evening. 90 tablet 3   No current facility-administered medications for this visit.     Past Surgical History:  Procedure Laterality Date    ABDOMINAL HYSTERECTOMY     BIOPSY  04/18/2023   Procedure: BIOPSY;  Surgeon: Franky Macho, MD;  Location: [facility] ENDO SUITE;  Service: Endoscopy;;   CARDIAC CATHETERIZATION N/A 02/19/2016   Procedure: Left Heart Cath and Coronary Angiography;  Surgeon: Yates Decamp, MD;  [facility]VASIVE CV LAB;  Service: Cardiovascular;  Laterality: N/A;   CARDIAC CATHETERIZATION N/A 02/19/2016   Procedure: Coronary Stent Intervention;  Surgeon: Yates Decamp, MD;  Location: [facility] INVASIVE CV LAB;  Service: Cardiovascular;  Laterality: N/A;   CESAREAN SECTION     x 2   CHOLECYSTECTOMY N/A 01/25/2017   Procedure: LAPAROSCOPIC CHOLECYSTECTOMY;  Surgeon: Franky Macho, MD;  Location: [facility] ORS;  Service: General;  Laterality: N/A;   COLONOSCOPY     CORONARY ARTERY BYPASS GRAFT N/A 10/26/2016   Procedure: CORONARY ARTERY BYPASS GRAFTING (CABG), ON PUMP, TIMES THREE, USING LEFT INTERNAL MAMMARY ARTERY AND ENDOSCOPICALLY HARVESTED RIGHT GREATER SAPHENOUS VEIN;  Surgeon: Alleen Borne, MD;  Location: [facility] OR;  Service: Open Heart Surgery;  Laterality: N/A;  LIMA-LAD SVG-RCA SVG-OM   ESOPHAGOGASTRODUODENOSCOPY     ESOPHAGOGASTRODUODENOSCOPY (EGD) WITH PROPOFOL N/A 04/18/2023   Procedure: ESOPHAGOGASTRODUODENOSCOPY (EGD) WITH PROPOFOL;  Surgeon: Franky Macho, MD;  Location: [facility] SUITE;  Service: Endoscopy;  Laterality: N/A;  2:15pm;asa 3, LM to see if pt can move up   LEFT HEART CATH AND CORONARY ANGIOGRAPHY N/A 10/22/2016   Procedure: Left Heart Cath and Coronary Angiography;  Surgeon: Lennette Bihari, MD;  Location: [facility] INVASIVE CV LAB;  Service: Cardiovascular;  Laterality: N/A;   STERNAL WIRES REMOVAL N/A 07/11/2017   Procedure: STERNAL WIRES REMOVAL;  Surgeon: Alleen Borne, MD;  Location: [facility] OR;  Service: Thoracic;  Laterality: N/A;   TEE WITHOUT CARDIOVERSION N/A 10/26/2016   Procedure: TRANSESOPHAGEAL ECHOCARDIOGRAM (TEE);  Surgeon: Alleen Borne, MD;  Location: [facility] OR;  Service: Open Heart Surgery;  Laterality: N/A;    WRIST FRACTURE SURGERY Left    3 times     Allergies  Allergen Reactions   Bee Venom Other (See Comments)    Pus sites at injection   Elastic Bandages & [Zinc] Hives   Mango Flavor Hives    All mango   Neosporin [Bacitracin-Polymyxin B]     Pt states she can use this medication    Adhesive [Tape] Itching and Swelling    Please use paper tape   Triple Antibiotic [Bacitracin-Neomycin-Polymyxin] Rash      Family History  Problem Relation Age of  Onset   COPD Mother    Arthritis Mother    Heart disease Mother    Stroke Mother    Psoriasis Father    Heart disease Father    Diabetes Maternal Grandmother    Diabetes Maternal Aunt    Diabetes Maternal Aunt      Social History Carolyn Gaines reports that she quit smoking about 12 years ago. Her smoking use included cigarettes. She has never used smokeless tobacco. Carolyn Gaines reports current alcohol use.   Review of Systems CONSTITUTIONAL: No weight loss, fever, chills, weakness or fatigue.  HEENT: Eyes: No visual loss, blurred vision, double vision or yellow sclerae.No hearing loss, sneezing, congestion, runny nose or sore throat.  SKIN: No rash or itching.  CARDIOVASCULAR: per hpi RESPIRATORY: No shortness of breath, cough or sputum.  GASTROINTESTINAL: No anorexia, nausea, vomiting or diarrhea. No abdominal pain or blood.  GENITOURINARY: No burning on urination, no polyuria NEUROLOGICAL: No headache, dizziness, syncope, paralysis, ataxia, numbness or tingling in the extremities. No change in bowel or bladder control.  MUSCULOSKELETAL: per hpi LYMPHATICS: No enlarged nodes. No history of splenectomy.  PSYCHIATRIC: No history of depression or anxiety.  ENDOCRINOLOGIC: No reports of sweating, cold or heat intolerance. No polyuria or polydipsia.  Marland Kitchen   Physical Examination Today's Vitals   05/03/23 0818 05/03/23 0845  BP: 136/88 128/78  Pulse: 84   SpO2: 96%   Weight: 199 lb 9.6 oz (90.5 kg)   Height: 5\' 4"  (1.626 m)     Body mass index is 34.26 kg/m.  Gen: resting comfortably, no acute distress HEENT: no scleral icterus, pupils equal round and reactive, no palptable cervical adenopathy,  CV: RRR, no m/rg, no jvd Resp: Clear to auscultation bilaterally GI: abdomen is soft, non-tender, non-distended, normal bowel sounds, no hepatosplenomegaly MSK: extremities are warm, no edema.  Skin: warm, no rash Neuro:  no focal deficits Psych: appropriate affect   Diagnostic Studies  12/2018 echo IMPRESSIONS     1. The left ventricle has normal systolic function with an ejection  fraction of 60-65%. The cavity size was normal. Left ventricular diastolic  parameters were normal.   2. The right ventricle has normal systolic function. The cavity was  normal. There is no increase in right ventricular wall thickness.   3. Mild thickening of the mitral valve leaflet.   4. The tricuspid valve is grossly normal.   5. The aortic valve is tricuspid. Moderate thickening of the aortic  valve. Sclerosis without any evidence of stenosis of the aortic valve.      01/2019 nuclear stress There was no ST segment deviation noted during stress. The study is normal. There are no perfusion defects consistent with prior infarct or current ischemia. This is a low risk study. The left ventricular ejection fraction is hyperdynamic (>65%).   Assessment and Plan   1. CAD/Chest pain - history of chronic chest wall pain since CABG, Did not tolerate gabapentin. Did have sternal wires removed 07/11/2017 with some improvement -recent DOE of unclear etiology, will update echo. Pending results may repeat lexiscan. Of note sedentary since knee surgery, gained 23 lbs since last year - needs to restart ASA 81mg  daily  2. Carotid stenosis - repeat carotid US  3. Hyperlipidemia - LDL above goal of <55, she is on max oral regimen with crestor and zetia and currently is not intersted in pcsk9i  4. Left leg pain  - left leg pain she  compares similar to a prior DVT - will order  Left LE venous US    F/u 3 months     [name], M.D.

## 2023-05-03 NOTE — Patient Instructions (Signed)
Medication Instructions:  Your physician has recommended you make the following change in your medication:   -Start Aspirin 81 mg tablets once daily.   *If you need a refill on your cardiac medications before your next appointment, please call your pharmacy*   Lab Work: NOne If you have labs (blood work) drawn today and your tests are completely normal, you will receive your results only by: MyChart Message (if you have MyChart) OR A paper copy in the mail If you have any lab test that is abnormal or we need to change your treatment, we will call you to review the results.   Testing/Procedures: Lower Extremity Doppler  Your physician has requested that you have an echocardiogram. Echocardiography is a painless test that uses sound waves to create images of your heart. It provides your doctor with information about the size and shape of your heart and how well your heart's chambers and valves are working. This procedure takes approximately one hour. There are no restrictions for this procedure. Please do NOT wear cologne, perfume, aftershave, or lotions (deodorant is allowed). Please arrive 15 minutes prior to your appointment time.  Your physician has requested that you have a carotid duplex. This test is an ultrasound of the carotid arteries in your neck. It looks at blood flow through these arteries that supply the brain with blood. Allow one hour for this exam. There are no restrictions or special instructions.    Follow-Up: At [facility], you and your health needs are our priority.  As part of our continuing mission to provide you with exceptional heart care, we have created designated Provider Care Teams.  These Care Teams include your primary Cardiologist (physician) and Advanced Practice Providers (APPs -  Physician Assistants and Nurse Practitioners) who all work together to provide you with the care you need, when you need it.  We recommend signing up for the patient  portal called "MyChart".  Sign up information is provided on this After Visit Summary.  MyChart is used to connect with patients for Virtual Visits (Telemedicine).  Patients are able to view lab/test results, encounter notes, upcoming appointments, etc.  Non-urgent messages can be sent to your provider as well.   To learn more about what you can do with MyChart, go to [email or web address].    Your next appointment:   3 month(s)  Provider:   You may see Dina Rich, MD or one of the following Advanced Practice Providers on your designated Care Team:   Randall An, PA-C  [name], [place]     Other Instructions

## 2023-05-11 ENCOUNTER — Ambulatory Visit (HOSPITAL_COMMUNITY)
Admission: RE | Admit: 2023-05-11 | Discharge: 2023-05-11 | Disposition: A | Discharge status: Home / Self Care | Payer: Medicaid Other | Source: Ambulatory Visit | Attending: Gastroenterology | Admitting: Gastroenterology

## 2023-05-11 DIAGNOSIS — K529 Noninfective gastroenteritis and colitis, unspecified: Secondary | ICD-10-CM | POA: Insufficient documentation

## 2023-05-11 MED ORDER — GADOBUTROL 1 MMOL/ML IV SOLN
9.0000 mL | Freq: Once | INTRAVENOUS | Status: AC | PRN
  Administered 2023-05-11: 9 mL via INTRAVENOUS

## 2023-05-22 NOTE — Progress Notes (Signed)
Hi Carolyn Gaines ,  Can you please call the patient and tell the patient the MRI did not show any inflammation of the small bowel hence she likely does not have inflammatory bowel disease as CT showed small bowel inflammation but MRI which is more specific is normal . This is a good news!  Thanks,  Vista Lawman, MD Gastroenterology and Hepatology [facility] Gastroenterology

## 2023-05-22 NOTE — Progress Notes (Signed)
Hi wendy can you tell her that I   Recommend eight-week course of treatment of Protonix 40mg  twice daily for diagnosis of EOE (eosinophillic esophagitis) . We recommend performing upper endoscopy after 8 to 12 weeks of therapy to assess endoscopic and histologic response.\   Recommend continue following up with [place] in GI clinic . If she has any severe pain , fever or chills that time seek urgent medical attention with ED or PCP

## 2023-06-10 ENCOUNTER — Ambulatory Visit (HOSPITAL_COMMUNITY)
Admission: RE | Admit: 2023-06-10 | Discharge: 2023-06-10 | Disposition: A | Discharge status: Home / Self Care | Payer: Medicaid Other | Source: Ambulatory Visit | Attending: Internal Medicine | Admitting: Internal Medicine

## 2023-06-10 DIAGNOSIS — R0602 Shortness of breath: Secondary | ICD-10-CM | POA: Diagnosis present

## 2023-06-10 LAB — ECHOCARDIOGRAM COMPLETE
Area-P 1/2: 2.83 cm2
S' Lateral: 2.8 cm

## 2023-06-10 NOTE — Progress Notes (Signed)
*  PRELIMINARY RESULTS* Echocardiogram 2D Echocardiogram has been performed.  Stacey Drain 06/10/2023, 10:12 AM

## 2023-06-24 ENCOUNTER — Encounter (INDEPENDENT_AMBULATORY_CARE_PROVIDER_SITE_OTHER): Payer: Self-pay | Admitting: *Deleted

## 2023-07-02 ENCOUNTER — Ambulatory Visit (INDEPENDENT_AMBULATORY_CARE_PROVIDER_SITE_OTHER): Payer: Medicaid Other | Admitting: Gastroenterology

## 2023-07-02 ENCOUNTER — Encounter (INDEPENDENT_AMBULATORY_CARE_PROVIDER_SITE_OTHER): Payer: Self-pay | Admitting: Gastroenterology

## 2023-07-02 VITALS — BP 107/69 | HR 76 | Temp 98.2°F | Ht 64.0 in | Wt 199.7 lb

## 2023-07-02 DIAGNOSIS — K200 Eosinophilic esophagitis: Secondary | ICD-10-CM | POA: Insufficient documentation

## 2023-07-02 DIAGNOSIS — K5904 Chronic idiopathic constipation: Secondary | ICD-10-CM

## 2023-07-02 DIAGNOSIS — K5900 Constipation, unspecified: Secondary | ICD-10-CM | POA: Diagnosis not present

## 2023-07-02 DIAGNOSIS — K269 Duodenal ulcer, unspecified as acute or chronic, without hemorrhage or perforation: Secondary | ICD-10-CM | POA: Diagnosis not present

## 2023-07-02 NOTE — Patient Instructions (Signed)
Continue with protonix 40mg  twice daily Increase water intake, aim for atleast 64 oz per day Increase fruits, veggies and whole grains, kiwi and prunes are especially good for constipation You can continue with metamucil and miralax for your constipation We will get you scheduled for repeat Upper endoscopy to evaluate for healing of your ulcer and esophagitis found on the last EGD  Follow up 3-4 months  It was a pleasure to see you today. I want to create trusting relationships with patients and provide genuine, compassionate, and quality care. I truly value your feedback! please be on the lookout for a survey regarding your visit with me today. I appreciate your input about our visit and your time in completing this!    Daryus Sowash L. Jeanmarie Hubert, MSN, APRN, AGNP-C Adult-Gerontology Nurse Practitioner [facility] Gastroenterology at [facility]

## 2023-07-02 NOTE — H&P (View-Only) (Signed)
 Referring Provider: Benita Stabile, MD Primary Care Physician:  Benita Stabile, MD Primary GI Physician: Dr. Tasia Catchings   Chief Complaint  Patient presents with   Follow-up    Pt arrives for follow up. Pt states she is doing well. Pt states that she does get sore throat from time to time possibly due to food she eats.    HPI:   Carolyn Gaines is a 57 y.o. female with past medical history of CAD status post PCI and CABG, hypertension, fibromyalgia ,anxiety depression, DVT not on anticoagulation anymore   Patient presenting today for follow up of abdominal pain/duodenal ulcers, EOE and constipation   Last seen September 2024, at that time patient reported intermittent abdominal pain mostly located in left upper quadrant that is stabbing-like sensation for the past 2 to 3 days.  Taking tramadol for chronic pain but denies any NSAID use.  History of peptic ulcer disease in her teens.    Patient recommended to have CT A/P, abdominal x-ray to assess stool burden, high-fiber diet, MiraLAX twice daily x 1 week then daily, Metamucil twice daily, schedule EGD  Abdominal x-ray on 9/3 with large stool burden, patient sent 4 L GoLytely for bowel purge.  CT A/P with contrast on 9/24 with question of mild terminal ileal inflammation, few short segment areas of small bowel submucosal fibrofatty infiltration, nonspecific but can be seen in chronic IBD, gas/fluid levels in proximal colon, nonspecific but can be seen in setting of diarrheal illness  Patient ordered MRE abdomen with without contrast for further evaluation which was performed on 05/11/2023 with no bowel wall thickening or inflammatory changes, specifically the terminal ileum was within normal limits as well as appendix.  No evidence of IBD.  EGD as outlined below with EOE, started on 8-week course of Protonix 40 mg twice daily and recommended to repeat upper endoscopy 8 to 12 weeks thereafter.  Present: Patient states she is doing much better since  being on protonix 40mg  BID. She states that she had some odynophagia and dysphagia but this has improved quite a bit with PPI BID, she is still cutting meats very small and trying to chew well. Abdominal pain is much better, but still having some abdominal pain, noting she has had to take mylanta maybe 2-3x since her last visit. Denies nausea or vomiting. She is trying to avoid greasy, spicy foods. Appetite is still somewhat decreased, having some early satiety.  She states she filled out her form to schedule EGD and turned it in upon arrival this morning.   She reports she did bowel prep which cleaned her out, is doing metamucil and miralax BID currently, having no issues with constipation, bowels are moving regularly. No rectal bleeding or melena    Last Colonoscopy:2018The entire examined colon is normal on direct and retroflexion views. - No specimens collected. Suggested repeat 10 years  Last Endoscopy:04/18/2023 - Esophageal mucosal changes suspicious for                            eosinophilic esophagitis.                           - Nodular mucosa in the esophagus. Biopsied.                           - Erythematous mucosa in the stomach. Biopsied.                           -  Non-bleeding duodenal ulcers with a clean ulcer base (Forrest Class III).                           - Biopsies were taken with a cold forceps for                            evaluation of eosinophilic esophagitis.  Past Medical History:  Diagnosis Date   Anemia    low iron   Anxiety    Arthritis    Bleeding ulcer    age 50. spent one week in hospital with bleeding ulcers.   Coronary artery disease    a. s/p prior stenting to RCA b. CABG in 09/2016 with LIMA-LAD, SVG-OM and SVG-RCA c. low-risk NST in 01/2019   Depression    Dyspnea    Fibromyalgia    Gallstones    GERD (gastroesophageal reflux disease)    Headache    migraines   MI (myocardial infarction) (HCC) 01/2016   Peptic ulcer    Seizures (HCC)     as a baby    Past Surgical History:  Procedure Laterality Date   ABDOMINAL HYSTERECTOMY     BIOPSY  04/18/2023   Procedure: BIOPSY;  Surgeon: Franky Macho, MD;  Location: [facility] ENDO SUITE;  Service: Endoscopy;;   CARDIAC CATHETERIZATION N/A 02/19/2016   Procedure: Left Heart Cath and Coronary Angiography;  Surgeon: Yates Decamp, MD;  Location: [facility] INVASIVE CV LAB;  Service: Cardiovascular;  Laterality: N/A;   CARDIAC CATHETERIZATION N/A 02/19/2016   Procedure: Coronary Stent Intervention;  Surgeon: Yates Decamp, MD;  Location: [facility] INVASIVE CV LAB;  Service: Cardiovascular;  Laterality: N/A;   CESAREAN SECTION     x 2   CHOLECYSTECTOMY N/A 01/25/2017   Procedure: LAPAROSCOPIC CHOLECYSTECTOMY;  Surgeon: Franky Macho, MD;  Location: [facility] ORS;  Service: General;  Laterality: N/A;   COLONOSCOPY     CORONARY ARTERY BYPASS GRAFT N/A 10/26/2016   Procedure: CORONARY ARTERY BYPASS GRAFTING (CABG), ON PUMP, TIMES THREE, USING LEFT INTERNAL MAMMARY ARTERY AND ENDOSCOPICALLY HARVESTED RIGHT GREATER SAPHENOUS VEIN;  Surgeon: Alleen Borne, MD;  Location: [facility] OR;  Service: Open Heart Surgery;  Laterality: N/A;  LIMA-LAD SVG-RCA SVG-OM   ESOPHAGOGASTRODUODENOSCOPY     ESOPHAGOGASTRODUODENOSCOPY (EGD) WITH PROPOFOL N/A 04/18/2023   Procedure: ESOPHAGOGASTRODUODENOSCOPY (EGD) WITH PROPOFOL;  Surgeon: Franky Macho, MD;  Location: [facility];  Service: Endoscopy;  Laterality: N/A;  2:15pm;asa 3, LM to see if pt can move up   LEFT HEART CATH AND CORONARY ANGIOGRAPHY N/A 10/22/2016   Procedure: Left Heart Cath and Coronary Angiography;  Surgeon: Lennette Bihari, MD;  Location: [facility] INVASIVE CV LAB;  Service: Cardiovascular;  Laterality: N/A;   STERNAL WIRES REMOVAL N/A 07/11/2017   Procedure: STERNAL WIRES REMOVAL;  Surgeon: Alleen Borne, MD;  Location: [facility] OR;  Service: Thoracic;  Laterality: N/A;   TEE WITHOUT CARDIOVERSION N/A 10/26/2016   Procedure: TRANSESOPHAGEAL ECHOCARDIOGRAM (TEE);  Surgeon: Alleen Borne,  MD;  Location: [facility] OR;  Service: Open Heart Surgery;  Laterality: N/A;   WRIST FRACTURE SURGERY Left    3 times    Current Outpatient Medications  Medication Sig Dispense Refill   acetaminophen (TYLENOL) 500 MG tablet Take 1,000 mg by mouth every 6 (six) hours as needed for mild pain.     aspirin EC 81 MG tablet Take 1 tablet (81 mg total) by mouth daily. Swallow  whole. 90 tablet 3   carvedilol (COREG) 3.125 MG tablet Take 1 tablet (3.125 mg total) by mouth 2 (two) times daily. 180 tablet 3   DULoxetine (CYMBALTA) 60 MG capsule Take 60 mg by mouth daily.     ezetimibe (ZETIA) 10 MG tablet Take 1 tablet (10 mg total) by mouth daily. 90 tablet 3   lidocaine (LIDODERM) 5 % 1 patch daily. As needed     nitroGLYCERIN (NITROSTAT) 0.4 MG SL tablet DISSOLVE 1 TABLET UNDER THE TONGUE EVERY 5 MINUTES FOR 3 DOSES AS NEEDED FOR CHEST PAIN 25 tablet 3   pantoprazole (PROTONIX) 40 MG tablet Take 1 tablet (40 mg total) by mouth 2 (two) times daily. 60 tablet 5   pregabalin (LYRICA) 75 MG capsule Take 75 mg by mouth 2 (two) times daily.     rosuvastatin (CRESTOR) 40 MG tablet Take 1 tablet (40 mg total) by mouth every evening. 90 tablet 3   No current facility-administered medications for this visit.    Allergies as of 07/02/2023 - Review Complete 07/02/2023  Allergen Reaction Noted   Bee venom Other (See Comments) 02/19/2016   Elastic bandages & [zinc] Hives 10/24/2018   Mango flavor Hives 11/03/2018   Neosporin [bacitracin-polymyxin b]  04/02/2023   Adhesive [tape] Itching and Swelling 02/19/2016   Triple antibiotic [bacitracin-neomycin-polymyxin] Rash 02/19/2016    Family History  Problem Relation Age of Onset   COPD Mother    Arthritis Mother    Heart disease Mother    Stroke Mother    Psoriasis Father    Heart disease Father    Diabetes Maternal Grandmother    Diabetes Maternal Aunt    Diabetes Maternal Aunt     Social History   Socioeconomic History   Marital status: Widowed     Spouse name: Not on file   Number of children: 2   Years of education: Not on file   Highest education level: Not on file  Occupational History   Occupation: Education officer, community  Tobacco Use   Smoking status: Former    Current packs/day: 0.00    Types: Cigarettes    Quit date: 03/21/2011    Years since quitting: 12.2   Smokeless tobacco: Never  Vaping Use   Vaping status: Never Used  Substance and Sexual Activity   Alcohol use: Yes    Comment: rare   Drug use: No   Sexual activity: Yes    Birth control/protection: Surgical    Comment: hyst  Other Topics Concern   Not on file  Social History Narrative   Not on file   Social Determinants of Health   Financial Resource Strain: Not on file  Food Insecurity: Not on file  Transportation Needs: Not on file  Physical Activity: Not on file  Stress: Not on file  Social Connections: Not on file    Review of systems General: negative for malaise, night sweats, fever, chills, weight loss Neck: Negative for lumps, goiter, pain and significant neck swelling Resp: Negative for cough, wheezing, dyspnea at rest CV: Negative for chest pain, leg swelling, palpitations, orthopnea GI: denies melena, hematochezia, nausea, vomiting, diarrhea, constipation, or unintentional weight loss. +occasional abdominal discomfort +occasional dysphagia +early satiety  MSK: Negative for joint pain or swelling, back pain, and muscle pain. Derm: Negative for itching or rash Psych: Denies depression, anxiety, memory loss, confusion. No homicidal or suicidal ideation.  Heme: Negative for prolonged bleeding, bruising easily, and swollen nodes. Endocrine: Negative for cold or heat intolerance, polyuria, polydipsia  and goiter. Neuro: negative for tremor, gait imbalance, syncope and seizures. The remainder of the review of systems is noncontributory.  Physical Exam: BP 107/69   Pulse 76   Temp 98.2 F (36.8 C)   Ht 5\' 4"  (1.626 m)   Wt 199 lb 11.2 oz  (90.6 kg)   BMI 34.28 kg/m  General:   Alert and oriented. No distress noted. Pleasant and cooperative.  Head:  Normocephalic and atraumatic. Eyes:  Conjuctiva clear without scleral icterus. Mouth:  Oral mucosa pink and moist. Good dentition. No lesions. Heart: Normal rate and rhythm, s1 and s2 heart sounds present.  Lungs: Clear lung sounds in all lobes. Respirations equal and unlabored. Abdomen:  +BS, soft, non-tender and non-distended. No rebound or guarding. No HSM or masses noted. Derm: No palmar erythema or jaundice Msk:  Symmetrical without gross deformities. Normal posture. Extremities:  Without edema. Neurologic:  Alert and  oriented x4 Psych:  Alert and cooperative. Normal mood and affect.  Invalid input(s): "6 MONTHS"   ASSESSMENT: ANARI LINDSEY is a 57 y.o. female presenting today for follow up of abdominal pain/duodenal ulcer, EOE, constipation  Abdominal pain/duodenal ulcer: Recent EGD with findings of duodenal ulcer, started on PPI twice daily and notes improvement in her abdominal pain.  Still having some occasional abdominal pain, some early satiety at times though is feeling much better with use of PPI.  Recommend to repeat EGD in 8-12 weeks after to evaluate for healing, which we will get her scheduled for.  Will continue with PPI twice daily, should continue to avoid all NSAIDs.  EOE/dysphagia: Recent EGD with findings of EOE, patient started on Protonix 40 mg twice daily.  Feels that dysphagia and odynophagia have improved significantly, she is still cutting meats very small and trying to chew thoroughly to avoid choking.  Recommended repeat EGD 8 to 12 weeks after initial evaluation to evaluate for healing, we will get her scheduled for this.  Will continue with PPI twice daily, chewing precautions, avoiding thicker, drier foods.  Constipation: Well-managed with MiraLAX and Metamucil twice daily.  Should continue with current regimen as well as good water intake, aiming  for 64 ounces water per day, diet high in fruits, veggies, whole grains.   PLAN:  Schedule EGD ASA III  2. Continue PPI BID  3. Avoid NSAIDs  4. Continue good water intake, high fiber diet 5. Continue miralax BID and metamucil BID  All questions were answered, patient verbalized understanding and is in agreement with plan as outlined above.   Follow Up: 3 months   Namiyah Grantham L. Jeanmarie Hubert, MSN, APRN, AGNP-C Adult-Gerontology Nurse Practitioner [facility]

## 2023-07-02 NOTE — Progress Notes (Signed)
Referring Provider: Benita Stabile, MD Primary Care Physician:  Benita Stabile, MD Primary GI Physician: Dr. Tasia Catchings   Chief Complaint  Patient presents with   Follow-up    Pt arrives for follow up. Pt states she is doing well. Pt states that she does get sore throat from time to time possibly due to food she eats.    HPI:   Carolyn Gaines is a 57 y.o. female with past medical history of CAD status post PCI and CABG, hypertension, fibromyalgia ,anxiety depression, DVT not on anticoagulation anymore   Patient presenting today for follow up of abdominal pain/duodenal ulcers, EOE and constipation   Last seen September 2024, at that time patient reported intermittent abdominal pain mostly located in left upper quadrant that is stabbing-like sensation for the past 2 to 3 days.  Taking tramadol for chronic pain but denies any NSAID use.  History of peptic ulcer disease in her teens.    Patient recommended to have CT A/P, abdominal x-ray to assess stool burden, high-fiber diet, MiraLAX twice daily x 1 week then daily, Metamucil twice daily, schedule EGD  Abdominal x-ray on 9/3 with large stool burden, patient sent 4 L GoLytely for bowel purge.  CT A/P with contrast on 9/24 with question of mild terminal ileal inflammation, few short segment areas of small bowel submucosal fibrofatty infiltration, nonspecific but can be seen in chronic IBD, gas/fluid levels in proximal colon, nonspecific but can be seen in setting of diarrheal illness  Patient ordered MRE abdomen with without contrast for further evaluation which was performed on 05/11/2023 with no bowel wall thickening or inflammatory changes, specifically the terminal ileum was within normal limits as well as appendix.  No evidence of IBD.  EGD as outlined below with EOE, started on 8-week course of Protonix 40 mg twice daily and recommended to repeat upper endoscopy 8 to 12 weeks thereafter.  Present: Patient states she is doing much better since  being on protonix 40mg  BID. She states that she had some odynophagia and dysphagia but this has improved quite a bit with PPI BID, she is still cutting meats very small and trying to chew well. Abdominal pain is much better, but still having some abdominal pain, noting she has had to take mylanta maybe 2-3x since her last visit. Denies nausea or vomiting. She is trying to avoid greasy, spicy foods. Appetite is still somewhat decreased, having some early satiety.  She states she filled out her form to schedule EGD and turned it in upon arrival this morning.   She reports she did bowel prep which cleaned her out, is doing metamucil and miralax BID currently, having no issues with constipation, bowels are moving regularly. No rectal bleeding or melena    Last Colonoscopy:2018The entire examined colon is normal on direct and retroflexion views. - No specimens collected. Suggested repeat 10 years  Last Endoscopy:04/18/2023 - Esophageal mucosal changes suspicious for                            eosinophilic esophagitis.                           - Nodular mucosa in the esophagus. Biopsied.                           - Erythematous mucosa in the stomach. Biopsied.                           -  Non-bleeding duodenal ulcers with a clean ulcer base (Forrest Class III).                           - Biopsies were taken with a cold forceps for                            evaluation of eosinophilic esophagitis.  Past Medical History:  Diagnosis Date   Anemia    low iron   Anxiety    Arthritis    Bleeding ulcer    age 50. spent one week in hospital with bleeding ulcers.   Coronary artery disease    a. s/p prior stenting to RCA b. CABG in 09/2016 with LIMA-LAD, SVG-OM and SVG-RCA c. low-risk NST in 01/2019   Depression    Dyspnea    Fibromyalgia    Gallstones    GERD (gastroesophageal reflux disease)    Headache    migraines   MI (myocardial infarction) (HCC) 01/2016   Peptic ulcer    Seizures (HCC)     as a baby    Past Surgical History:  Procedure Laterality Date   ABDOMINAL HYSTERECTOMY     BIOPSY  04/18/2023   Procedure: BIOPSY;  Surgeon: Franky Macho, MD;  Location: [facility];  Service: Endoscopy;;   CARDIAC CATHETERIZATION N/A 02/19/2016   Procedure: Left Heart Cath and Coronary Angiography;  Surgeon: Yates Decamp, MD;  Location: [facility] INVASIVE CV LAB;  Service: Cardiovascular;  Laterality: N/A;   CARDIAC CATHETERIZATION N/A 02/19/2016   Procedure: Coronary Stent Intervention;  Surgeon: Yates Decamp, MD;  Location: [facility] INVASIVE CV LAB;  Service: Cardiovascular;  Laterality: N/A;   CESAREAN SECTION     x 2   CHOLECYSTECTOMY N/A 01/25/2017   Procedure: LAPAROSCOPIC CHOLECYSTECTOMY;  Surgeon: Franky Macho, MD;  Location: [facility] ORS;  Service: General;  Laterality: N/A;   COLONOSCOPY     CORONARY ARTERY BYPASS GRAFT N/A 10/26/2016   Procedure: CORONARY ARTERY BYPASS GRAFTING (CABG), ON PUMP, TIMES THREE, USING LEFT INTERNAL MAMMARY ARTERY AND ENDOSCOPICALLY HARVESTED RIGHT GREATER SAPHENOUS VEIN;  Surgeon: Alleen Borne, MD;  Location: [facility] OR;  Service: Open Heart Surgery;  Laterality: N/A;  LIMA-LAD SVG-RCA SVG-OM   ESOPHAGOGASTRODUODENOSCOPY     ESOPHAGOGASTRODUODENOSCOPY (EGD) WITH PROPOFOL N/A 04/18/2023   Procedure: ESOPHAGOGASTRODUODENOSCOPY (EGD) WITH PROPOFOL;  Surgeon: Franky Macho, MD;  Location: [facility];  Service: Endoscopy;  Laterality: N/A;  2:15pm;asa 3, LM to see if pt can move up   LEFT HEART CATH AND CORONARY ANGIOGRAPHY N/A 10/22/2016   Procedure: Left Heart Cath and Coronary Angiography;  Surgeon: Lennette Bihari, MD;  Location: [facility] INVASIVE CV LAB;  Service: Cardiovascular;  Laterality: N/A;   STERNAL WIRES REMOVAL N/A 07/11/2017   Procedure: STERNAL WIRES REMOVAL;  Surgeon: Alleen Borne, MD;  Location: [facility] OR;  Service: Thoracic;  Laterality: N/A;   TEE WITHOUT CARDIOVERSION N/A 10/26/2016   Procedure: TRANSESOPHAGEAL ECHOCARDIOGRAM (TEE);  Surgeon: Alleen Borne,  MD;  Location: [facility] OR;  Service: Open Heart Surgery;  Laterality: N/A;   WRIST FRACTURE SURGERY Left    3 times    Current Outpatient Medications  Medication Sig Dispense Refill   acetaminophen (TYLENOL) 500 MG tablet Take 1,000 mg by mouth every 6 (six) hours as needed for mild pain.     aspirin EC 81 MG tablet Take 1 tablet (81 mg total) by mouth daily. Swallow  whole. 90 tablet 3   carvedilol (COREG) 3.125 MG tablet Take 1 tablet (3.125 mg total) by mouth 2 (two) times daily. 180 tablet 3   DULoxetine (CYMBALTA) 60 MG capsule Take 60 mg by mouth daily.     ezetimibe (ZETIA) 10 MG tablet Take 1 tablet (10 mg total) by mouth daily. 90 tablet 3   lidocaine (LIDODERM) 5 % 1 patch daily. As needed     nitroGLYCERIN (NITROSTAT) 0.4 MG SL tablet DISSOLVE 1 TABLET UNDER THE TONGUE EVERY 5 MINUTES FOR 3 DOSES AS NEEDED FOR CHEST PAIN 25 tablet 3   pantoprazole (PROTONIX) 40 MG tablet Take 1 tablet (40 mg total) by mouth 2 (two) times daily. 60 tablet 5   pregabalin (LYRICA) 75 MG capsule Take 75 mg by mouth 2 (two) times daily.     rosuvastatin (CRESTOR) 40 MG tablet Take 1 tablet (40 mg total) by mouth every evening. 90 tablet 3   No current facility-administered medications for this visit.    Allergies as of 07/02/2023 - Review Complete 07/02/2023  Allergen Reaction Noted   Bee venom Other (See Comments) 02/19/2016   Elastic bandages & [zinc] Hives 10/24/2018   Mango flavor Hives 11/03/2018   Neosporin [bacitracin-polymyxin b]  04/02/2023   Adhesive [tape] Itching and Swelling 02/19/2016   Triple antibiotic [bacitracin-neomycin-polymyxin] Rash 02/19/2016    Family History  Problem Relation Age of Onset   COPD Mother    Arthritis Mother    Heart disease Mother    Stroke Mother    Psoriasis Father    Heart disease Father    Diabetes Maternal Grandmother    Diabetes Maternal Aunt    Diabetes Maternal Aunt     Social History   Socioeconomic History   Marital status: Widowed     Spouse name: Not on file   Number of children: 2   Years of education: Not on file   Highest education level: Not on file  Occupational History   Occupation: Education officer, community  Tobacco Use   Smoking status: Former    Current packs/day: 0.00    Types: Cigarettes    Quit date: 03/21/2011    Years since quitting: 12.2   Smokeless tobacco: Never  Vaping Use   Vaping status: Never Used  Substance and Sexual Activity   Alcohol use: Yes    Comment: rare   Drug use: No   Sexual activity: Yes    Birth control/protection: Surgical    Comment: hyst  Other Topics Concern   Not on file  Social History Narrative   Not on file   Social Determinants of Health   Financial Resource Strain: Not on file  Food Insecurity: Not on file  Transportation Needs: Not on file  Physical Activity: Not on file  Stress: Not on file  Social Connections: Not on file    Review of systems General: negative for malaise, night sweats, fever, chills, weight loss Neck: Negative for lumps, goiter, pain and significant neck swelling Resp: Negative for cough, wheezing, dyspnea at rest CV: Negative for chest pain, leg swelling, palpitations, orthopnea GI: denies melena, hematochezia, nausea, vomiting, diarrhea, constipation, or unintentional weight loss. +occasional abdominal discomfort +occasional dysphagia +early satiety  MSK: Negative for joint pain or swelling, back pain, and muscle pain. Derm: Negative for itching or rash Psych: Denies depression, anxiety, memory loss, confusion. No homicidal or suicidal ideation.  Heme: Negative for prolonged bleeding, bruising easily, and swollen nodes. Endocrine: Negative for cold or heat intolerance, polyuria, polydipsia  and goiter. Neuro: negative for tremor, gait imbalance, syncope and seizures. The remainder of the review of systems is noncontributory.  Physical Exam: BP 107/69   Pulse 76   Temp 98.2 F (36.8 C)   Ht 5\' 4"  (1.626 m)   Wt 199 lb 11.2 oz  (90.6 kg)   BMI 34.28 kg/m  General:   Alert and oriented. No distress noted. Pleasant and cooperative.  Head:  Normocephalic and atraumatic. Eyes:  Conjuctiva clear without scleral icterus. Mouth:  Oral mucosa pink and moist. Good dentition. No lesions. Heart: Normal rate and rhythm, s1 and s2 heart sounds present.  Lungs: Clear lung sounds in all lobes. Respirations equal and unlabored. Abdomen:  +BS, soft, non-tender and non-distended. No rebound or guarding. No HSM or masses noted. Derm: No palmar erythema or jaundice Msk:  Symmetrical without gross deformities. Normal posture. Extremities:  Without edema. Neurologic:  Alert and  oriented x4 Psych:  Alert and cooperative. Normal mood and affect.  Invalid input(s): "6 MONTHS"   ASSESSMENT: ANARI LINDSEY is a 57 y.o. female presenting today for follow up of abdominal pain/duodenal ulcer, EOE, constipation  Abdominal pain/duodenal ulcer: Recent EGD with findings of duodenal ulcer, started on PPI twice daily and notes improvement in her abdominal pain.  Still having some occasional abdominal pain, some early satiety at times though is feeling much better with use of PPI.  Recommend to repeat EGD in 8-12 weeks after to evaluate for healing, which we will get her scheduled for.  Will continue with PPI twice daily, should continue to avoid all NSAIDs.  EOE/dysphagia: Recent EGD with findings of EOE, patient started on Protonix 40 mg twice daily.  Feels that dysphagia and odynophagia have improved significantly, she is still cutting meats very small and trying to chew thoroughly to avoid choking.  Recommended repeat EGD 8 to 12 weeks after initial evaluation to evaluate for healing, we will get her scheduled for this.  Will continue with PPI twice daily, chewing precautions, avoiding thicker, drier foods.  Constipation: Well-managed with MiraLAX and Metamucil twice daily.  Should continue with current regimen as well as good water intake, aiming  for 64 ounces water per day, diet high in fruits, veggies, whole grains.   PLAN:  Schedule EGD ASA III  2. Continue PPI BID  3. Avoid NSAIDs  4. Continue good water intake, high fiber diet 5. Continue miralax BID and metamucil BID  All questions were answered, patient verbalized understanding and is in agreement with plan as outlined above.   Follow Up: 3 months   Carolyn Gaines L. Jeanmarie Hubert, MSN, APRN, AGNP-C Adult-Gerontology Nurse Practitioner [facility]

## 2023-07-09 ENCOUNTER — Other Ambulatory Visit: Payer: Self-pay | Admitting: Physician Assistant

## 2023-07-11 ENCOUNTER — Encounter (HOSPITAL_COMMUNITY)
Admission: RE | Admit: 2023-07-11 | Discharge: 2023-07-11 | Disposition: A | Discharge status: Home / Self Care | Payer: Medicaid Other | Source: Ambulatory Visit | Attending: Gastroenterology | Admitting: Gastroenterology

## 2023-07-11 ENCOUNTER — Encounter (HOSPITAL_COMMUNITY): Payer: Self-pay

## 2023-07-11 ENCOUNTER — Other Ambulatory Visit: Payer: Self-pay

## 2023-07-11 NOTE — Pre-Procedure Instructions (Signed)
Attempted pre-op phonecall. Left VM for her to call us back. 

## 2023-07-15 ENCOUNTER — Telehealth: Payer: Self-pay | Admitting: Cardiology

## 2023-07-15 MED ORDER — ROSUVASTATIN CALCIUM 40 MG PO TABS: 40.0000 mg | ORAL_TABLET | Freq: Every evening | ORAL | 3 refills | Status: DC

## 2023-07-15 NOTE — Telephone Encounter (Signed)
Pt was returning nurse call regarding results and is requesting a callback. Please advise

## 2023-07-15 NOTE — Telephone Encounter (Signed)
*  STAT* If patient is at the pharmacy, call can be transferred to refill team.   1. Which medications need to be refilled? (please list name of each medication and dose if known)   rosuvastatin (CRESTOR) 40 MG tablet    2. Which pharmacy/location (including street and city if local pharmacy) is medication to be sent to?  [facility] [phone] - [place], [place] - [address] AT [facility] [address]      3. Do they need a 30 day or 90 day supply? 90 day    Pt is out of medication

## 2023-07-15 NOTE — Telephone Encounter (Signed)
Pt notified of results

## 2023-07-15 NOTE — Telephone Encounter (Signed)
Refill complete 

## 2023-07-16 ENCOUNTER — Encounter (HOSPITAL_COMMUNITY)
Admission: RE | Disposition: A | Discharge status: Home / Self Care | Payer: Self-pay | Source: Ambulatory Visit | Attending: Gastroenterology

## 2023-07-16 ENCOUNTER — Other Ambulatory Visit: Payer: Self-pay

## 2023-07-16 ENCOUNTER — Ambulatory Visit (HOSPITAL_COMMUNITY)
Admission: RE | Admit: 2023-07-16 | Discharge: 2023-07-16 | Disposition: A | Discharge status: Home / Self Care | Payer: Medicaid Other | Source: Ambulatory Visit | Attending: Gastroenterology | Admitting: Gastroenterology

## 2023-07-16 ENCOUNTER — Encounter (HOSPITAL_COMMUNITY): Payer: Self-pay

## 2023-07-16 ENCOUNTER — Ambulatory Visit (HOSPITAL_COMMUNITY): Payer: Self-pay | Admitting: Anesthesiology

## 2023-07-16 DIAGNOSIS — K200 Eosinophilic esophagitis: Secondary | ICD-10-CM | POA: Insufficient documentation

## 2023-07-16 DIAGNOSIS — Z09 Encounter for follow-up examination after completed treatment for conditions other than malignant neoplasm: Secondary | ICD-10-CM | POA: Insufficient documentation

## 2023-07-16 DIAGNOSIS — K2289 Other specified disease of esophagus: Secondary | ICD-10-CM | POA: Diagnosis not present

## 2023-07-16 DIAGNOSIS — R1310 Dysphagia, unspecified: Secondary | ICD-10-CM | POA: Insufficient documentation

## 2023-07-16 DIAGNOSIS — K269 Duodenal ulcer, unspecified as acute or chronic, without hemorrhage or perforation: Secondary | ICD-10-CM | POA: Diagnosis not present

## 2023-07-16 DIAGNOSIS — K2950 Unspecified chronic gastritis without bleeding: Secondary | ICD-10-CM | POA: Diagnosis not present

## 2023-07-16 DIAGNOSIS — K3189 Other diseases of stomach and duodenum: Secondary | ICD-10-CM | POA: Diagnosis not present

## 2023-07-16 HISTORY — PX: ESOPHAGOGASTRODUODENOSCOPY (EGD) WITH PROPOFOL: SHX5813

## 2023-07-16 HISTORY — PX: SAVORY DILATION: SHX5439

## 2023-07-16 HISTORY — PX: BIOPSY: SHX5522

## 2023-07-16 SURGERY — ESOPHAGOGASTRODUODENOSCOPY (EGD) WITH PROPOFOL
Anesthesia: General

## 2023-07-16 MED ORDER — EPHEDRINE 5 MG/ML INJ
INTRAVENOUS | Status: AC
  Filled 2023-07-16: qty 5

## 2023-07-16 MED ORDER — EPHEDRINE SULFATE-NACL 50-0.9 MG/10ML-% IV SOSY
PREFILLED_SYRINGE | INTRAVENOUS | Status: DC | PRN
  Administered 2023-07-16: 5 mg via INTRAVENOUS

## 2023-07-16 MED ORDER — PROPOFOL 500 MG/50ML IV EMUL
INTRAVENOUS | Status: DC | PRN
  Administered 2023-07-16: 150 ug/kg/min via INTRAVENOUS

## 2023-07-16 MED ORDER — PHENYLEPHRINE 80 MCG/ML (10ML) SYRINGE FOR IV PUSH (FOR BLOOD PRESSURE SUPPORT)
PREFILLED_SYRINGE | INTRAVENOUS | Status: AC
Start: 2023-07-16 — End: ?
  Filled 2023-07-16: qty 10

## 2023-07-16 MED ORDER — PHENYLEPHRINE 80 MCG/ML (10ML) SYRINGE FOR IV PUSH (FOR BLOOD PRESSURE SUPPORT)
PREFILLED_SYRINGE | INTRAVENOUS | Status: DC | PRN
  Administered 2023-07-16 (×2): 160 ug via INTRAVENOUS
  Administered 2023-07-16: 80 ug via INTRAVENOUS

## 2023-07-16 MED ORDER — BUDESONIDE 2 MG/10ML PO SUSP: 2.0000 mg | Freq: Two times a day (BID) | ORAL | 2 refills | Status: AC

## 2023-07-16 MED ORDER — LACTATED RINGERS IV SOLN: INTRAVENOUS | Status: DC | PRN

## 2023-07-16 MED ORDER — PROPOFOL 10 MG/ML IV BOLUS
INTRAVENOUS | Status: DC | PRN
  Administered 2023-07-16: 40 mg via INTRAVENOUS
  Administered 2023-07-16: 100 mg via INTRAVENOUS

## 2023-07-16 MED ORDER — LIDOCAINE HCL (PF) 2 % IJ SOLN
INTRAMUSCULAR | Status: DC | PRN
  Administered 2023-07-16: 100 mg via INTRADERMAL

## 2023-07-16 NOTE — Anesthesia Procedure Notes (Signed)
Date/Time: 07/16/2023 8:18 AM  Performed by: Julian Reil, CRNAPre-anesthesia Checklist: Patient identified, Emergency Drugs available, Suction available and Patient being monitored Patient Re-evaluated:Patient Re-evaluated prior to induction Oxygen Delivery Method: Nasal cannula Induction Type: IV induction Placement Confirmation: positive ETCO2

## 2023-07-16 NOTE — Discharge Instructions (Signed)
  Discharge instructions Please read the instructions outlined below and refer to this sheet in the next few weeks. These discharge instructions provide you with general information on caring for yourself after you leave the hospital. Your doctor may also give you specific instructions. While your treatment has been planned according to the most current medical practices available, unavoidable complications occasionally occur. If you have any problems or questions after discharge, please call your doctor. ACTIVITY You may resume your regular activity but move at a slower pace for the next 24 hours.  Take frequent rest periods for the next 24 hours.  Walking will help expel (get rid of) the air and reduce the bloated feeling in your abdomen.  No driving for 24 hours (because of the anesthesia (medicine) used during the test).  You may shower.  Do not sign any important legal documents or operate any machinery for 24 hours (because of the anesthesia used during the test).  NUTRITION Drink plenty of fluids.  You may resume your normal diet.  Begin with a light meal and progress to your normal diet.  Avoid alcoholic beverages for 24 hours or as instructed by your caregiver.  MEDICATIONS You may resume your normal medications unless your caregiver tells you otherwise.  WHAT YOU CAN EXPECT TODAY You may experience abdominal discomfort such as a feeling of fullness or "gas" pains.  FOLLOW-UP Your doctor will discuss the results of your test with you.  SEEK IMMEDIATE MEDICAL ATTENTION IF ANY OF THE FOLLOWING OCCUR: Excessive nausea (feeling sick to your stomach) and/or vomiting.  Severe abdominal pain and distention (swelling).  Trouble swallowing.  Temperature over 101 F (37.8 C).  Rectal bleeding or vomiting of blood.       FDA approved EOE treatment : Budesonide oral suspension is 2 mg, twice daily, for 12 weeks . Take budesonide slowly, over 5 to 10 minutes, and not to eat or drink for 30  minutes after taking the medication. Administered as an oral viscous slurry (4 mg total daily dose, generally divided into twice a day). Viscous budesonide can be compounded by mixing Pulmicort Respules with sucralose (Splenda; 10 1-gram packets per 1 mg of budesonide, creating a volume of approximately 8 mL)  I hope you have a great rest of your week!   Vista Lawman , M.D.. Gastroenterology and Hepatology [facility]

## 2023-07-16 NOTE — Transfer of Care (Signed)
Immediate Anesthesia Transfer of Care Note  Patient: Carolyn Gaines  Procedure(s) Performed: ESOPHAGOGASTRODUODENOSCOPY (EGD) WITH PROPOFOL BIOPSY SAVORY DILATION  Patient Location: Short Stay  Anesthesia Type:General  Level of Consciousness: drowsy  Airway & Oxygen Therapy: Patient Spontanous Breathing and Patient connected to nasal cannula oxygen  Post-op Assessment: Report given to RN and Post -op Vital signs reviewed and stable  Post vital signs: Reviewed and stable  Last Vitals:  Vitals Value Taken Time  BP 118/65 07/16/23 0839  Temp    Pulse 87 07/16/23 0840  Resp 15 07/16/23 0840  SpO2 100 % 07/16/23 0840  Vitals shown include unfiled device data.  Last Pain:  Vitals:   07/16/23 0822  TempSrc:   PainSc: 0-No pain      Patients Stated Pain Goal: 5 (07/16/23 0745)  Complications: No notable events documented.

## 2023-07-16 NOTE — Op Note (Signed)
[facility] Patient Name: Carolyn Gaines Procedure Date: 07/16/2023 8:06 AM MRN: [id number] Date of Birth: 1965-11-06 Attending MD: Sanjuan Dame , MD, [id number] CSN: [id number] Age: 57 Admit Type: Outpatient Procedure:                Upper GI endoscopy Indications:              Follow-up of eosinophilic esophagitis Providers:                Sanjuan Dame, MD, Sheran Fava, Elinor Parkinson Referring MD:              Medicines:                Monitored Anesthesia Care Complications:            No immediate complications. Estimated Blood Loss:     Estimated blood loss was minimal. Procedure:                Pre-Anesthesia Assessment:                           - Prior to the procedure, a History and Physical                            was performed, and patient medications and                            allergies were reviewed. [name] tolerance of                            previous anesthesia was also reviewed. The risks                            and benefits of the procedure and the sedation                            options and risks were discussed with the patient.                            All questions were answered, and informed consent                            was obtained. Prior Anticoagulants: The patient has                            taken no anticoagulant or antiplatelet agents. ASA                            Grade Assessment: III - A patient with severe                            systemic disease. After reviewing the risks and  benefits, the patient was deemed in satisfactory                            condition to undergo the procedure.                           After obtaining informed consent, the endoscope was                            passed under direct vision. Throughout the                            procedure, the patient's blood pressure, pulse, and                            oxygen  saturations were monitored continuously. The                            [id number]-[id number] ([id number]) scope was introduced through the                            mouth, and advanced to the second part of duodenum.                            The upper GI endoscopy was accomplished without                            difficulty. The patient tolerated the procedure                            well. Scope In: 8:23:24 AM Scope Out: 8:34:50 AM Total Procedure Duration: 0 hours 11 minutes 26 seconds  Findings:      Mucosal changes including longitudinal furrows were found in the entire       esophagus. Esophageal findings were graded using the Eosinophilic       Esophagitis Endoscopic Reference Score (EoE-EREFS) as: Edema Grade 0       Normal (distinct vascular markings), Rings Grade 0 None (no ridges or       rings seen), Exudates Grade 0 None (no white lesions seen), Furrows       Grade 1 Mild (vertical lines without visible depth) and Stricture none       (no stricture found). Biopsies were obtained from the proximal and       distal esophagus with cold forceps for histology of suspected       eosinophilic esophagitis. A guidewire was placed and the scope was       withdrawn. Dilation was performed with a Savary dilator with mild       resistance at 13 mm and 15 mm. The dilation site was examined following       endoscope reinsertion and showed mild mucosal disruption.      Mildly erythematous mucosa without bleeding was found in the gastric       antrum. Biopsies were taken with a cold forceps for histology.      There is no endoscopic evidence of ulceration in the duodenal bulb and  in the second portion of the duodenum. Impression:               - Esophageal mucosal changes secondary to                            eosinophilic esophagitis. Dilated.                           - Erythematous mucosa in the antrum. Biopsied.                           - Biopsies were taken with a cold forceps for                             evaluation of eosinophilic esophagitis.                           -Improved examination for EOE and healed duodenal                            ulcers Moderate Sedation:      Per Anesthesia Care Recommendation:           - Patient has a contact number available for                            emergencies. The signs and symptoms of potential                            delayed complications were discussed with the                            patient. Return to normal activities tomorrow.                            Written discharge instructions were provided to the                            patient.                           - Resume previous diet.                           - Continue present medications.                           - Await pathology results.                           - Repeat upper endoscopy in 6 months for                            surveillance based on pathology results.                           - Return to GI clinic as previously scheduled.                           -  Will start budesonide slurry depending on biopsy                            results , as patient is still symptomatic .                           -c/w PPI BID Procedure Code(s):        --- Professional ---                           [id number], Esophagogastroduodenoscopy, flexible,                            transoral; with insertion of guide wire followed by                            passage of dilator(s) through esophagus over guide                            wire                           43239, 59, Esophagogastroduodenoscopy, flexible,                            transoral; with biopsy, single or multiple Diagnosis Code(s):        --- Professional ---                           K20.0, Eosinophilic esophagitis                           K31.89, Other diseases of stomach and duodenum CPT copyright 2022 American Medical Association. All rights reserved. The codes documented in this report are  preliminary and upon coder review may  be revised to meet current compliance requirements. Sanjuan Dame, MD Sanjuan Dame, MD 07/16/2023 8:44:00 AM This report has been signed electronically. Number of Addenda: 0

## 2023-07-16 NOTE — Interval H&P Note (Signed)
History and Physical Interval Note:  07/16/2023 7:27 AM  Carolyn Gaines  has presented today for surgery, with the diagnosis of EOE, DUODENAL ULCER.  The various methods of treatment have been discussed with the patient and family. After consideration of risks, benefits and other options for treatment, the patient has consented to  Procedure(s) with comments: ESOPHAGOGASTRODUODENOSCOPY (EGD) WITH PROPOFOL (N/A) - 8:15AM;ASA 3 as a surgical intervention.  [name] history has been reviewed, patient examined, no change in status, stable for surgery.  I have reviewed the patient's chart and labs.  Questions were answered to the patient's satisfaction.     Juanetta Beets [name]

## 2023-07-16 NOTE — Anesthesia Preprocedure Evaluation (Addendum)
Anesthesia Evaluation  Patient identified by MRN, date of birth, ID band Patient awake    Reviewed: Allergy & Precautions, H&P , NPO status , Patient's Chart, lab work & pertinent test results, reviewed documented beta blocker date and time   Airway Mallampati: II  TM Distance: >3 FB Neck ROM: full    Dental no notable dental hx. (+) Dental Advisory Given   Pulmonary shortness of breath, former smoker   Pulmonary exam normal breath sounds clear to auscultation       Cardiovascular Exercise Tolerance: Good hypertension, + angina  + CAD and + Past MI  negative cardio ROS  Rhythm:regular Rate:Normal     Neuro/Psych  Headaches, Seizures -,  PSYCHIATRIC DISORDERS Anxiety Depression     Neuromuscular disease negative neurological ROS  negative psych ROS   GI/Hepatic negative GI ROS, Neg liver ROS, PUD,GERD  ,,  Endo/Other  negative endocrine ROS    Renal/GU negative Renal ROS  negative genitourinary   Musculoskeletal   Abdominal   Peds  Hematology negative hematology ROS (+) Blood dyscrasia, anemia   Anesthesia Other Findings   Reproductive/Obstetrics negative OB ROS                             Anesthesia Physical Anesthesia Plan  ASA: 3  Anesthesia Plan: General   Post-op Pain Management:    Induction:   PONV Risk Score and Plan: Propofol infusion  Airway Management Planned:   Additional Equipment:   Intra-op Plan:   Post-operative Plan:   Informed Consent: I have reviewed the patients History and Physical, chart, labs and discussed the procedure including the risks, benefits and alternatives for the proposed anesthesia with the patient or authorized representative who has indicated his/her understanding and acceptance.     Dental Advisory Given  Plan Discussed with: CRNA  Anesthesia Plan Comments:        Anesthesia Quick Evaluation

## 2023-07-18 NOTE — Anesthesia Postprocedure Evaluation (Signed)
Anesthesia Post Note  Patient: Carolyn Gaines  Procedure(s) Performed: ESOPHAGOGASTRODUODENOSCOPY (EGD) WITH PROPOFOL BIOPSY SAVORY DILATION  Patient location during evaluation: Phase II Anesthesia Type: General Level of consciousness: awake Pain management: pain level controlled Vital Signs Assessment: post-procedure vital signs reviewed and stable Respiratory status: spontaneous breathing and respiratory function stable Cardiovascular status: blood pressure returned to baseline and stable Postop Assessment: no headache and no apparent nausea or vomiting Anesthetic complications: no Comments: Late entry   No notable events documented.   Last Vitals:  Vitals:   07/16/23 0745 07/16/23 0840  BP: 114/79 118/65  Pulse: 77 86  Resp: (!) 21 17  Temp: 36.7 C (!) 36.4 C  SpO2: 99% 100%    Last Pain:  Vitals:   07/16/23 0840  TempSrc: Axillary  PainSc: 0-No pain                 Windell Norfolk

## 2023-07-19 LAB — SURGICAL PATHOLOGY

## 2023-07-22 ENCOUNTER — Encounter (HOSPITAL_COMMUNITY): Payer: Self-pay | Admitting: Gastroenterology

## 2023-07-25 ENCOUNTER — Encounter (INDEPENDENT_AMBULATORY_CARE_PROVIDER_SITE_OTHER): Payer: Self-pay | Admitting: *Deleted

## 2023-07-25 NOTE — Progress Notes (Signed)
I reviewed the pathology results. [name], can you send her a letter with the findings as described below please?  Repeat Upper endoscopy in 6 months   Thanks,  Vista Lawman, MD Gastroenterology and Hepatology [facility]  ---------------------------------------------------------------------------------------------  [facility] [address], [place], [place] [address] Phone:  [phone]   07/25/23 [name], [place]  Dear Carolyn Gaines,  I am writing to inform you that the biopsies taken during your recent endoscopic examination showed:  Upper endoscopy showed healed ulcers which was previously seen.  This is a good news  It does however still shows eosinophilic esophagitis which is inflammation of your food pipe.  Although the disease activity has decreased which means there is improvement  I recommend continue Protonix twice daily .  Also given persistent disease activity I have added budesonide slurry to your medications for 12 weeks   Budesonide FDA approved EOE treatment : Budesonide oral suspension is 2 mg, twice daily, for 12 weeks . Take budesonide slowly, over 5 to 10 minutes, and not to eat or drink for 30 minutes after taking the medication. Administered as an oral viscous slurry (4 mg total daily dose, generally divided into twice a day). Viscous budesonide can be compounded by mixing Pulmicort Respules with sucralose (Splenda; 10 1-gram packets per 1 mg of budesonide, creating a volume of approximately 8 mL)  Please continue to see [place] in the GI clinic next appointment 10/31/2023  Also I value your feedback , so if you get a survey , please take the time to fill it out and thank you for choosing [facility]  Please call us at [phone] if you have persistent problems or have questions about your condition that have not  been fully answered at this time.  Sincerely,  Vista Lawman, MD Gastroenterology and Hepatology

## 2023-08-06 ENCOUNTER — Emergency Department (HOSPITAL_COMMUNITY)
Admission: EM | Admit: 2023-08-06 | Discharge: 2023-08-06 | Disposition: A | Discharge status: Home / Self Care | Payer: Medicaid Other | Attending: Emergency Medicine | Admitting: Emergency Medicine

## 2023-08-06 ENCOUNTER — Encounter (HOSPITAL_COMMUNITY): Payer: Self-pay

## 2023-08-06 DIAGNOSIS — R590 Localized enlarged lymph nodes: Secondary | ICD-10-CM | POA: Insufficient documentation

## 2023-08-06 DIAGNOSIS — R591 Generalized enlarged lymph nodes: Secondary | ICD-10-CM

## 2023-08-06 DIAGNOSIS — R220 Localized swelling, mass and lump, head: Secondary | ICD-10-CM | POA: Diagnosis present

## 2023-08-06 DIAGNOSIS — Z7982 Long term (current) use of aspirin: Secondary | ICD-10-CM | POA: Insufficient documentation

## 2023-08-06 NOTE — Discharge Instructions (Signed)
 The swelling from her ear is likely a swollen lymph node.  He may apply warm compresses to the area to help with pain and swelling.  You may also use Tylenol  and ibuprofen as needed for pain.  Please follow-up with your PCP if symptoms do not improve within the next week.  Return the ER for any difficulty swallowing, difficulty breathing, fevers, chills, severe worsening of pain, any other new or concerning symptoms.

## 2023-08-06 NOTE — ED Provider Notes (Signed)
 [place] EMERGENCY DEPARTMENT AT [facility] Provider Note   CSN: [id number] Arrival date & time: 08/06/23  1329     History  Chief Complaint  Patient presents with   Facial Swelling    Carolyn Gaines is a 58 y.o. female who recently underwent endoscopy on 07/16/2023 with Dr. Cinderella and diagnosed with eosinophilic esophagitis, presents with concern for swelling in her left ear that has been going on for about the past week.  Denies any difficulty breathing, difficulty swallowing, fevers or chills.  Denies any recent dental work.  She has been taking Benadryl at home without relief of symptoms.  HPI     Home Medications Prior to Admission medications   Medication Sig Start Date End Date Taking? Authorizing Provider  acetaminophen  (TYLENOL ) 500 MG tablet Take 1,000 mg by mouth every 6 (six) hours as needed for mild pain.    [provider]  aspirin  EC 81 MG tablet Take 1 tablet (81 mg total) by mouth daily. Swallow whole. 05/03/23   Alvan Dorn FALCON, MD  Budesonide  2 MG/10ML SUSP Take 2 mg by mouth in the morning and at bedtime. FDA approved EOE treatment : Budesonide  oral suspension is 2 mg, twice daily, for 12 weeks . Take budesonide  slowly, over 5 to 10 minutes, and not to eat or drink for 30 minutes after taking the medication. Administered as an oral viscous slurry (4 mg total daily dose, generally divided into twice a day). Viscous budesonide  can be compounded by mixing Pulmicort  Respules with sucralose (Splenda; 10 1-gram packets per 1 mg of budesonide , creating a volume of approximately 8 mL) 07/16/23 01/12/24  Ahmed, Muhammad F, MD  carvedilol  (COREG ) 3.125 MG tablet Take 1 tablet (3.125 mg total) by mouth 2 (two) times daily. 02/13/21   Parthenia Olivia HERO, PA-C  DULoxetine  (CYMBALTA ) 60 MG capsule Take 60 mg by mouth daily.    [provider]  ezetimibe  (ZETIA ) 10 MG tablet TAKE 1 TABLET(10 MG) BY MOUTH DAILY 07/10/23   Parthenia Olivia HERO, PA-C  lidocaine   (LIDODERM ) 5 % 1 patch daily. As needed 02/06/23   [provider]  nitroGLYCERIN  (NITROSTAT ) 0.4 MG SL tablet DISSOLVE 1 TABLET UNDER THE TONGUE EVERY 5 MINUTES FOR 3 DOSES AS NEEDED FOR CHEST PAIN 03/13/23   Parthenia Olivia HERO, PA-C  pantoprazole  (PROTONIX ) 40 MG tablet Take 1 tablet (40 mg total) by mouth 2 (two) times daily. 04/23/23 10/20/23  Ahmed, Muhammad F, MD  pregabalin (LYRICA) 75 MG capsule Take 75 mg by mouth 2 (two) times daily. 02/26/22   [provider]  rosuvastatin  (CRESTOR ) 40 MG tablet Take 1 tablet (40 mg total) by mouth every evening. 07/15/23   Alvan Dorn FALCON, MD      Allergies    Bee venom, Elastic bandages & [zinc], Mango flavoring agent (non-screening), Neosporin [bacitracin-polymyxin b], Adhesive [tape], and Triple antibiotic [bacitracin-neomycin-polymyxin]    Review of Systems   Review of Systems  Constitutional:  Negative for chills and fever.  Respiratory:  Negative for shortness of breath.     Physical Exam Updated Vital Signs BP 122/76 (BP Location: Right Arm)   Pulse 86   Temp 98.3 F (36.8 C) (Oral)   Resp 18   Ht 5' 4 (1.626 m)   Wt 90.3 kg   SpO2 99%   BMI 34.16 kg/m  Physical Exam Vitals and nursing note reviewed.  Constitutional:      Appearance: Normal appearance.  HENT:     Head: Atraumatic.  Mouth/Throat:     Comments: No posterior oropharynx edema or erythema Gums intact no erythema and no abscesses Neck:     Comments: Left preauricular lymphadenopathy Bilateral submandibular lymphadenopathy Cardiovascular:     Rate and Rhythm: Normal rate and regular rhythm.  Pulmonary:     Effort: Pulmonary effort is normal.  Neurological:     General: No focal deficit present.     Mental Status: She is alert.  Psychiatric:        Mood and Affect: Mood normal.        Behavior: Behavior normal.     ED Results / Procedures / Treatments   Labs (all labs ordered are listed, but only abnormal results are displayed) Labs  Reviewed - No data to display  EKG None  Radiology No results found.  Procedures Procedures    Medications Ordered in ED Medications - No data to display  ED Course/ Medical Decision Making/ A&P                                 Medical Decision Making    Differential diagnosis includes but is not limited to dental infection, mastoiditis, parotitis, lymphadenopathy  ED Course:  Patient well-appearing, stable vital signs.  The bump in front of her left ear she is concerned about is a pre-auricular swollen lymph node.  No overlying skin changes, no erythema or edema.  No concern for infection at this time.  She does not have any dentition of the upper gumline, but no signs of gum infection.  Discussed that this lymph node swelling will go away on its time.  Discussed that she may use some warm compresses on the area to help with pain and swelling.  We discussed following up with Dr. Cinderella if she has any further concerns.  Stable and appropriate for discharge home this time.   Impression: Left post auricular Lymphadenopathy  Disposition:  The patient was discharged home with instructions to follow-up with Dr. Cinderella or PCP if symptoms continue. Return precautions given.             Final Clinical Impression(s) / ED Diagnoses Final diagnoses:  Lymphadenopathy    Rx / DC Orders ED Discharge Orders     None         Veta Palma, DEVONNA 08/06/23 1458    Cleotilde Rogue, MD 08/06/23 [phone]

## 2023-08-06 NOTE — ED Triage Notes (Addendum)
 Pt c/o swelling to L jaw/in front of L ear x1 week.  No oral swelling.  Pain score 8/10.  Pt reports taking Benadryl w/o relief.    Pt reports having throat stretched on 12/17.    No difficulty breathing.  Voice is clear.

## 2023-09-02 ENCOUNTER — Ambulatory Visit: Payer: Medicaid Other | Admitting: Cardiology

## 2023-10-03 ENCOUNTER — Other Ambulatory Visit (HOSPITAL_COMMUNITY): Payer: Self-pay | Admitting: Internal Medicine

## 2023-10-03 ENCOUNTER — Encounter: Payer: Self-pay | Admitting: Cardiology

## 2023-10-03 DIAGNOSIS — Z1231 Encounter for screening mammogram for malignant neoplasm of breast: Secondary | ICD-10-CM

## 2023-10-03 DIAGNOSIS — Z87891 Personal history of nicotine dependence: Secondary | ICD-10-CM

## 2023-10-09 ENCOUNTER — Ambulatory Visit (HOSPITAL_COMMUNITY)
Admission: RE | Admit: 2023-10-09 | Discharge: 2023-10-09 | Disposition: A | Discharge status: Home / Self Care | Source: Ambulatory Visit | Attending: Internal Medicine | Admitting: Internal Medicine

## 2023-10-09 DIAGNOSIS — Z1231 Encounter for screening mammogram for malignant neoplasm of breast: Secondary | ICD-10-CM | POA: Insufficient documentation

## 2023-10-10 ENCOUNTER — Encounter: Payer: Self-pay | Admitting: Nurse Practitioner

## 2023-10-10 ENCOUNTER — Ambulatory Visit: Attending: Nurse Practitioner | Admitting: Nurse Practitioner

## 2023-10-10 VITALS — BP 118/72 | HR 70 | Ht 64.0 in | Wt 188.0 lb

## 2023-10-10 DIAGNOSIS — I25119 Atherosclerotic heart disease of native coronary artery with unspecified angina pectoris: Secondary | ICD-10-CM | POA: Diagnosis not present

## 2023-10-10 DIAGNOSIS — E785 Hyperlipidemia, unspecified: Secondary | ICD-10-CM

## 2023-10-10 DIAGNOSIS — I10 Essential (primary) hypertension: Secondary | ICD-10-CM

## 2023-10-10 DIAGNOSIS — R079 Chest pain, unspecified: Secondary | ICD-10-CM | POA: Diagnosis not present

## 2023-10-10 DIAGNOSIS — I6529 Occlusion and stenosis of unspecified carotid artery: Secondary | ICD-10-CM | POA: Diagnosis not present

## 2023-10-10 DIAGNOSIS — Z86718 Personal history of other venous thrombosis and embolism: Secondary | ICD-10-CM

## 2023-10-10 DIAGNOSIS — R0609 Other forms of dyspnea: Secondary | ICD-10-CM

## 2023-10-10 MED ORDER — NITROGLYCERIN 0.4 MG SL SUBL: SUBLINGUAL_TABLET | SUBLINGUAL | 3 refills | Status: AC

## 2023-10-10 MED ORDER — ISOSORBIDE MONONITRATE ER 30 MG PO TB24: 15.0000 mg | ORAL_TABLET | Freq: Every day | ORAL | 1 refills | Status: DC

## 2023-10-10 NOTE — Patient Instructions (Addendum)
 Medication Instructions:  Your physician has recommended you make the following change in your medication:  Please start IMDUR 15 Mg daily   Labwork: None   Testing/Procedures: None   Follow-Up: Your physician recommends that you schedule a follow-up appointment in: 1 month  Any Other Special Instructions Will Be Listed Below (If Applicable).  If you need a refill on your cardiac medications before your next appointment, please call your pharmacy.

## 2023-10-10 NOTE — Progress Notes (Unsigned)
 Cardiology Office Note:  .   Date:  10/10/2023 ID:  [name], DOB Mar 09, 1966, MRN [id number] PCP: Benita Stabile, MD  [facility] Providers Cardiologist:  Dina Rich, MD    History of Present Illness: .   Carolyn Gaines is a 59 y.o. female with a PMH of CAD, s/p CABG in 2018, hx of chest pain, DOE, HLD, hx of DVT, and carotid artery stenosis, who presents today for 71-month follow-up.  Previous cardiovascular history of STEMI in 2017, received drug-eluting stent to the RCA.  She had residual LAD disease, 60 to 70% at the time, was medically managed.  Underwent cardiac catheterization in 2018-see full report below.  Underwent CABG in 2018 (LIMA-LAD, SVG-OM, SVG-RCA).  Was noted that she had a history of chronic chest wall pain since CABG. Sternal wires were removed in 2018, did improve some symptoms.  Last seen by Dr. Dina Rich on May 03, 2023.  She noted recent DOE of unclear etiology.  She had gained 23 pounds since previous year due to being sedentary after knee surgery.  Echo was arranged and was overall unremarkable-see below.  Today she presents for 26-month follow-up appointment.  She admits to sharp chest pains along her mid chest, denies any exertional triggers, however typically noticed when picking up her dogs. Has this has been chronic since her surgery, has been worse recently. Says it feels like a pulled muscle. Did take NTG once that helped. Rates this as 8/10 when it occurs. Left arm also is occasionally sore with this. DOE remains stable. Denies any palpitations, syncope, presyncope, dizziness, orthopnea, PND, swelling or significant weight changes, acute bleeding, or claudication.  ROS: Negative. See HPI.   Studies Reviewed: Marland Kitchen    EKG: EKG Interpretation Date/Time:  Thursday October 10 2023 08:41:21 EDT Ventricular Rate:  74 PR Interval:  152 QRS Duration:  76 QT Interval:  380 QTC Calculation: 421 R Axis:   73  Text Interpretation: Normal sinus  rhythm Low voltage QRS When compared with ECG of 16-Apr-2023 13:02, Nonspecific T wave abnormality now evident in Anterior leads Confirmed by Sharlene Dory [id number]) on 10/10/2023 8:42:32 AM    Echo 05/2023:   1. Left ventricular ejection fraction, by estimation, is 55 to 60%. The  left ventricle has normal function. The left ventricle has no regional  wall motion abnormalities. Left ventricular diastolic parameters were  normal.   2. Right ventricular systolic function is normal. The right ventricular  size is normal. There is normal pulmonary artery systolic pressure.   3. The mitral valve is normal in structure. No evidence of mitral valve  regurgitation. No evidence of mitral stenosis.   4. The aortic valve is tricuspid. There is mild calcification of the  aortic valve. There is mild thickening of the aortic valve. Aortic valve  regurgitation is not visualized. Aortic valve sclerosis is present, with  no evidence of aortic valve stenosis.   5. The inferior vena cava is normal in size with greater than 50%  respiratory variability, suggesting right atrial pressure of 3 mmHg.  Carotid duplex 04/2023:  Impression: No hemodynamically significant stenosis by duplex criteria in the extracranial cerebrovascular circulation.  Lexiscan 10/2020:  Lexiscan stress is electrically negative Myovue scan shows normal perfusion No significant ischemia or scar LVEF calculated at 60% Overall low risk study.  LHC 09/2016:  Mid LAD lesion, 50 %stenosed. Ost LM lesion, 85 %stenosed. Mid RCA-1 lesion, 50 %stenosed. Mid RCA-2 lesion, 0 %stenosed. A drug eluting . Prox  RCA lesion, 20 %stenosed.   Severe native CAD with 80-90% ostial stenosis of the left main coronary artery which did not improve following IC nitroglycerin administration; 50% mid LAD stenoses; normal left circumflex vessel; and patent mid RCA stent with 45-50% smooth eccentric stenosis proximal to the stented segment and 20% proximal  irregularity.   Mild  residual LV dysfunction with an ejection fraction of 45-50% with mild mid inferior hypocontractility.   RECOMMENDATION: The patient has been on full dose Brilinta since her DES stent implantation in July 2018.  Surgical consultation was obtained with Dr. Tyrone Sage.  Brilinta will be discontinued.  The patient will be stabilized on IV NTG with plans to reinstitute heparin. Plan for CABG surgery later this week following several days of Brilinta washout.   If the patient becomes unstable, consider emergent left main stenting.  Physical Exam:   VS:  BP 118/72   Pulse 70   Ht 5\' 4"  (1.626 m)   Wt 188 lb (85.3 kg)   SpO2 98%   BMI 32.27 kg/m    Wt Readings from Last 3 Encounters:  10/10/23 188 lb (85.3 kg)  08/06/23 199 lb (90.3 kg)  07/16/23 199 lb 11.1 oz (90.6 kg)    GEN: Well nourished, well developed in no acute distress NECK: No JVD; No carotid bruits CARDIAC: S1/S2, RRR, no murmurs, rubs, gallops RESPIRATORY:  Clear to auscultation without rales, wheezing or rhonchi  ABDOMEN: Soft, non-tender, non-distended EXTREMITIES:  No edema; No deformity   ASSESSMENT AND PLAN: .    Chest pain of uncertain etiology, CAD, s/p CABG History of chronic and longstanding chest pain since surgery in 2018, has been worse recently per her report.  Her description sounds musculoskeletal.  It was previously recommended if her NST came back low risk, could trial Imdur based on her symptoms.  Will initiate Imdur 15 mg daily.  Continue aspirin, Zetia, carvedilol, rosuvastatin, and nitroglycerin as needed.  Will provide refill of nitroglycerin.  Care and ED precautions discussed. Heart healthy diet and regular cardiovascular exercise encouraged.  If no improvement in symptoms after trialing medication, plan to repeat NST.  2.  Carotid artery stenosis, hyperlipidemia Most recent carotid Doppler in October 24 was negative for any significant carotid artery stenosis.  Most recent LDL 49.   No medication changes at this time.  Denies any symptoms. Heart healthy diet and regular cardiovascular exercise encouraged.   3.  DOE Echocardiogram from November 2024 was normal.  Etiology unclear.  Could most likely be due to deconditioning after knee surgery.  Will continue to monitor.   4.  History of DVT Most recent Doppler last fall showed resolution of DVT.  No medication changes at this time.  Continue follow-up with PCP.  Dispo: Follow-up with me/APP in 4 to 6 weeks or sooner if anything changes.  Signed, Sharlene Dory, NP

## 2023-10-14 ENCOUNTER — Ambulatory Visit (HOSPITAL_COMMUNITY)
Admission: RE | Admit: 2023-10-14 | Discharge: 2023-10-14 | Disposition: A | Discharge status: Home / Self Care | Source: Ambulatory Visit | Attending: Internal Medicine | Admitting: Internal Medicine

## 2023-10-14 DIAGNOSIS — Z87891 Personal history of nicotine dependence: Secondary | ICD-10-CM | POA: Diagnosis present

## 2023-10-28 ENCOUNTER — Other Ambulatory Visit (HOSPITAL_COMMUNITY): Payer: Self-pay | Admitting: Internal Medicine

## 2023-10-28 DIAGNOSIS — R928 Other abnormal and inconclusive findings on diagnostic imaging of breast: Secondary | ICD-10-CM

## 2023-10-31 ENCOUNTER — Ambulatory Visit (INDEPENDENT_AMBULATORY_CARE_PROVIDER_SITE_OTHER): Payer: Medicaid Other | Admitting: Gastroenterology

## 2023-10-31 ENCOUNTER — Other Ambulatory Visit (HOSPITAL_COMMUNITY): Payer: Self-pay | Admitting: Internal Medicine

## 2023-10-31 ENCOUNTER — Ambulatory Visit (HOSPITAL_COMMUNITY)
Admission: RE | Admit: 2023-10-31 | Discharge: 2023-10-31 | Discharge status: Home / Self Care | Source: Ambulatory Visit | Attending: Internal Medicine | Admitting: Internal Medicine

## 2023-10-31 ENCOUNTER — Ambulatory Visit (HOSPITAL_COMMUNITY)
Admission: RE | Admit: 2023-10-31 | Discharge: 2023-10-31 | Disposition: A | Discharge status: Home / Self Care | Source: Ambulatory Visit | Attending: Internal Medicine | Admitting: Internal Medicine

## 2023-10-31 ENCOUNTER — Encounter (INDEPENDENT_AMBULATORY_CARE_PROVIDER_SITE_OTHER): Payer: Self-pay | Admitting: Gastroenterology

## 2023-10-31 ENCOUNTER — Encounter (HOSPITAL_COMMUNITY): Payer: Self-pay

## 2023-10-31 VITALS — BP 127/80 | HR 93 | Temp 97.1°F | Ht 64.0 in | Wt 187.5 lb

## 2023-10-31 DIAGNOSIS — K200 Eosinophilic esophagitis: Secondary | ICD-10-CM

## 2023-10-31 DIAGNOSIS — R928 Other abnormal and inconclusive findings on diagnostic imaging of breast: Secondary | ICD-10-CM | POA: Insufficient documentation

## 2023-10-31 DIAGNOSIS — K5904 Chronic idiopathic constipation: Secondary | ICD-10-CM

## 2023-10-31 NOTE — Patient Instructions (Signed)
-  will plan for repeat EGD in June, we will reach out closer to time to schedule this  -continue protonix twice daily  -increase water intake to around 60 oz per day, you can use miralax/metamucil as needed. Make sure diet is high in fruits, veggies, whole grains -discuss swollen lymph nodes with PCP -I will discuss with Dr. Tasia Catchings any other therapies he wishes to add for your EOE prior to next EGD  Follow up 3 months  It was a pleasure to see you today. I want to create trusting relationships with patients and provide genuine, compassionate, and quality care. I truly value your feedback! please be on the lookout for a survey regarding your visit with me today. I appreciate your input about our visit and your time in completing this!    Jah Alarid L. Jeanmarie Hubert, MSN, APRN, AGNP-C Adult-Gerontology Nurse Practitioner [facility] at [facility]

## 2023-10-31 NOTE — Progress Notes (Addendum)
 Referring Provider: Benita Stabile, MD Primary Care Physician:  Benita Stabile, MD Primary GI Physician: Dr. Tasia Catchings   Chief Complaint  Patient presents with   Constipation    Follow up on constipation.    eosinophilic esophagitis    Follow up after EGD for ulcer and eosinophilic esophagitis. States she went to ED in January and had swollen glands in neck and was told it was from where she had her esophagus stretched and now having knots on right side of throat.    HPI:   Carolyn Gaines is a 58 y.o. 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 Does still have meats and chew very thoroughly.  abdominal pain improved.  doing metamucil and miralax twice daily for constipation   Patient recommended to schedule repeat EGD, continue PPI twice daily, avoid NSAIDs, good water intake, high-fiber diet, continue MiraLAX twice daily and Metamucil twice daily  Repeat EGD in December as outlined below with continued presence of elevated eosinophils though improved from previous, recommended to repeat EGD again in 6 months, do budesonide slurry x12 weeks and continue PPI BID  Present: She notes starting in January, she had some swollen glands in her Left neck, was seen in the ED and told it was secondary to her EGD with dilation. She notes some occurrence of this on the Right side as well. She notes that swollen lymph nodes feel sore. She endorses pain in her ears and some sinus drainage at times as well.  Swelling seems to come and go.   She is still having some issues with continued dysphagia. She completed the budesonide slurry and is taking PPI BID. She notes that she had some potato salad and cole slaw  last night that would not go down well, she ended up having to regurgitate the food back up. This occurs maybe 3-4 times per week. She cannot pinpoint if certain foods trigger this or not.  She is trying to eat healthier. Feels dysphagia is about the same as it has been the past few months, does note its better since being on PPI BID. Denies any GERD symptoms.   She notes bristol stool scale 1 stools, sometimes 3-4 on the scale, drinks 8-16 oz of water per day. Not currently taking anything for constipation routinely, though does use miralax PRN. She is eating a lot of oranges. Usually has a BM once a day, sometimes has to strain. No rectal bleeding or melena.    Last Colonoscopy:2018 The entire examined colon is normal on direct and retroflexion views. - No specimens collected. Suggested repeat 10 years  Last Endoscopy:07/16/23 - Esophageal mucosal changes secondary to                            eosinophilic esophagitis. Dilated.                           - Erythematous mucosa in the antrum. Biopsied.                           - Biopsies were taken with  a cold forceps for                            evaluation of eosinophilic esophagitis.                           -Improved examination for EOE and healed duodenal                            ulcers  A. STOMACH, BIOPSY: -  Antral and oxyntic mucosa with mild to moderate chronic focal minimally active gastritis. -  An immunohistochemical stain for Helicobacter pylori organisms is negative.  B. ESOPHAGUS, DISTAL, BIOPSY: -  Squamocolumnar junctional mucosa with basal cell hyperplasia, papillary elongation, and mild activity with increased intraepithelial eosinophils (up to 15/hpf) and increased intraepithelial lymphocytes. -  A PAS stain is negative for fungal forms.  C. ESOPHAGUS, PROXIMAL, BIOPSY: -  Squamous mucosa with basal cell hyperplasia, papillary elongation and increased intraepithelial eosinophils (up to 10/hpf) as well  as increased intraepithelial lymphocytes. -  A PAS stain is negative for fungal forms.   Filed Weights   10/31/23 0946  Weight: 187 lb 8 oz (85 kg)     Past Medical History:  Diagnosis Date   Anemia    low iron   Anxiety    Arthritis    Bleeding ulcer    age 16. spent one week in hospital with bleeding ulcers.   Coronary artery disease    a. s/p prior stenting to RCA b. CABG in 09/2016 with LIMA-LAD, SVG-OM and SVG-RCA c. low-risk NST in 01/2019   Depression    Dyspnea    Fibromyalgia    Gallstones    GERD (gastroesophageal reflux disease)    Headache    migraines   MI (myocardial infarction) (HCC) 01/2016   Peptic ulcer    Seizures (HCC)    as a baby    Past Surgical History:  Procedure Laterality Date   ABDOMINAL HYSTERECTOMY     BIOPSY  04/18/2023   Procedure: BIOPSY;  Surgeon: Franky Macho, MD;  Location: [facility];  Service: Endoscopy;;   BIOPSY  07/16/2023   Procedure: BIOPSY;  Surgeon: Franky Macho, MD;  Location: [facility];  Service: Endoscopy;;   CARDIAC CATHETERIZATION N/A 02/19/2016   Procedure: Left Heart Cath and Coronary Angiography;  Surgeon: Yates Decamp, MD;  Location[facility] INVASIVE CV LAB;  Service: Cardiovascular;  Laterality: N/A;   CARDIAC CATHETERIZATION N/A 02/19/2016   Procedure: Coronary Stent Intervention;  Surgeon: Yates Decamp, MD;  Location: [facility] INVASIVE CV LAB;  Service: Cardiovascular;  Laterality: N/A;   CESAREAN SECTION     x 2   CHOLECYSTECTOMY N/A 01/25/2017   Procedure: LAPAROSCOPIC CHOLECYSTECTOMY;  Surgeon: Franky Macho, MD;  Location: [facility];  Service: General;  Laterality: N/A;   COLONOSCOPY     CORONARY ARTERY BYPASS GRAFT N/A 10/26/2016   Procedure: CORONARY ARTERY BYPASS GRAFTING (CABG), ON PUMP, TIMES THREE, USING LEFT INTERNAL MAMMARY ARTERY AND ENDOSCOPICALLY HARVESTED RIGHT GREATER SAPHENOUS VEIN;  Surgeon: Alleen Borne, MD;  Location: [facility] OR;  Service: Open Heart Surgery;  Laterality: N/A;   LIMA-LAD SVG-RCA SVG-OM   ESOPHAGOGASTRODUODENOSCOPY     ESOPHAGOGASTRODUODENOSCOPY (EGD) WITH PROPOFOL N/A 04/18/2023   Procedure: ESOPHAGOGASTRODUODENOSCOPY (EGD) WITH PROPOFOL;  Surgeon: Franky Macho, MD;  Location: [facility];  Service: Endoscopy;  Laterality: N/A;  2:15pm;asa 3, LM to see if pt can move up   ESOPHAGOGASTRODUODENOSCOPY (EGD) WITH PROPOFOL N/A 07/16/2023   Procedure: ESOPHAGOGASTRODUODENOSCOPY (EGD) WITH PROPOFOL;  Surgeon: Franky Macho, MD;  Location: [facility];  Service: Endoscopy;  Laterality: N/A;  8:15AM;ASA 3   LEFT HEART CATH AND CORONARY ANGIOGRAPHY N/A 10/22/2016   Procedure: Left Heart Cath and Coronary Angiography;  Surgeon: Lennette Bihari, MD;  Location: [facility] INVASIVE CV LAB;  Service: Cardiovascular;  Laterality: N/A;   SAVORY DILATION  07/16/2023   Procedure: SAVORY DILATION;  Surgeon: Franky Macho, MD;  Location: [facility];  Service: Endoscopy;;   STERNAL WIRES REMOVAL N/A 07/11/2017   Procedure: STERNAL WIRES REMOVAL;  Surgeon: Alleen Borne, MD;  Location: [facility] OR;  Service: Thoracic;  Laterality: N/A;   TEE WITHOUT CARDIOVERSION N/A 10/26/2016   Procedure: TRANSESOPHAGEAL ECHOCARDIOGRAM (TEE);  Surgeon: Alleen Borne, MD;  Location[facility] OR;  Service: Open Heart Surgery;  Laterality: N/A;   WRIST FRACTURE SURGERY Left    3 times    Current Outpatient Medications  Medication Sig Dispense Refill   acetaminophen (TYLENOL) 500 MG tablet Take 1,000 mg by mouth every 6 (six) hours as needed for mild pain.     aspirin EC 81 MG tablet Take 1 tablet (81 mg total) by mouth daily. Swallow whole. 90 tablet 3   carvedilol (COREG) 3.125 MG tablet Take 1 tablet (3.125 mg total) by mouth 2 (two) times daily. 180 tablet 3   DULoxetine (CYMBALTA) 60 MG capsule Take 60 mg by mouth daily.     ezetimibe (ZETIA) 10 MG tablet TAKE 1 TABLET(10 MG) BY MOUTH DAILY 90 tablet 3   isosorbide mononitrate (IMDUR) 30 MG 24 hr tablet Take 0.5 tablets (15 mg total)  by mouth daily. 45 tablet 1   lidocaine (LIDODERM) 5 % 1 patch daily. As needed     nitroGLYCERIN (NITROSTAT) 0.4 MG SL tablet DISSOLVE 1 TABLET UNDER THE TONGUE EVERY 5 MINUTES FOR 3 DOSES AS NEEDED FOR CHEST PAIN 25 tablet 3   pantoprazole (PROTONIX) 40 MG tablet Take 1 tablet (40 mg total) by mouth 2 (two) times daily. 60 tablet 5   pregabalin (LYRICA) 75 MG capsule Take 75 mg by mouth 2 (two) times daily.     rosuvastatin (CRESTOR) 40 MG tablet Take 1 tablet (40 mg total) by mouth every evening. 90 tablet 3   traMADol (ULTRAM) 50 MG tablet Take 50 mg by mouth every 6 (six) hours as needed.     Budesonide 2 MG/10ML SUSP Take 2 mg by mouth in the morning and at bedtime. FDA approved EOE treatment : Budesonide oral suspension is 2 mg, twice daily, for 12 weeks . Take budesonide slowly, over 5 to 10 minutes, and not to eat or drink for 30 minutes after taking the medication. Administered as an oral viscous slurry (4 mg total daily dose, generally divided into twice a day). Viscous budesonide can be compounded by mixing Pulmicort Respules with sucralose (Splenda; 10 1-gram packets per 1 mg of budesonide, creating a volume of approximately 8 mL) (Patient not taking: Reported on 10/31/2023) 120 mL 2   No current facility-administered medications for this visit.    Allergies as of 10/31/2023 - Review Complete 10/31/2023  Allergen Reaction Noted   Bee venom Other (See Comments) 02/19/2016   Elastic bandages & [zinc] Hives 10/24/2018   Mango flavoring agent (non-screening) Hives 11/03/2018   Neosporin [bacitracin-polymyxin b]  04/02/2023   Adhesive [tape] Itching and  Swelling 02/19/2016   Triple antibiotic [bacitracin-neomycin-polymyxin] Rash 02/19/2016    Social History   Socioeconomic History   Marital status: Widowed    Spouse name: Not on file   Number of children: 2   Years of education: Not on file   Highest education level: Not on file  Occupational History   Occupation: Community education officer  Tobacco Use   Smoking status: Former    Current packs/day: 0.00    Types: Cigarettes    Quit date: 03/21/2011    Years since quitting: 12.6   Smokeless tobacco: Never  Vaping Use   Vaping status: Never Used  Substance and Sexual Activity   Alcohol use: Yes    Comment: rare   Drug use: No   Sexual activity: Yes    Birth control/protection: Surgical    Comment: hyst  Other Topics Concern   Not on file  Social History Narrative   Not on file   Social Drivers of Health   Financial Resource Strain: Not on file  Food Insecurity: Not on file  Transportation Needs: Not on file  Physical Activity: Not on file  Stress: Not on file  Social Connections: Not on file    Review of systems General: negative for malaise, night sweats, fever, chills, weight loss Neck: +swollen lymph nodes  Resp: Negative for cough, wheezing, dyspnea at rest CV: Negative for chest pain, leg swelling, palpitations, orthopnea GI: denies melena, hematochezia, nausea, vomiting, diarrhea, odyonophagia, early satiety or unintentional weight loss. +dysphagia +constipation  MSK: Negative for joint pain or swelling, back pain, and muscle pain. Derm: Negative for itching or rash Psych: Denies depression, anxiety, memory loss, confusion. No homicidal or suicidal ideation.  Heme: Negative for prolonged bleeding, bruising easily, and swollen nodes. Endocrine: Negative for cold or heat intolerance, polyuria, polydipsia and goiter. Neuro: negative for tremor, gait imbalance, syncope and seizures. The remainder of the review of systems is noncontributory.  Physical Exam: BP 127/80   Pulse 93   Temp (!) 97.1 F (36.2 C)   Ht 5\' 4"  (1.626 m)   Wt 187 lb 8 oz (85 kg)   BMI 32.18 kg/m  General:   Alert and oriented. No distress noted. Pleasant and cooperative.  Head:  Normocephalic and atraumatic. Eyes:  Conjuctiva clear without scleral icterus. Mouth:  Oral mucosa pink and moist. Good dentition. No  lesions. Heart: Normal rate and rhythm, s1 and s2 heart sounds present.  Lungs: Clear lung sounds in all lobes. Respirations equal and unlabored. Abdomen:  +BS, soft, non-tender and non-distended. No rebound or guarding. No HSM or masses noted. Neurologic:  Alert and  oriented x4 Psych:  Alert and cooperative. Normal mood and affect.  Invalid input(s): "6 MONTHS"   ASSESSMENT: KISSA CAMPOY is a 58 y.o. female presenting today for follow up of EOE and Constipation  Constipation: chronic, intermittent, previously doing well with metamucil and miralax, now only using miralax PRN. Having a stool daily though usually 1-3 on Bristol stool scale. She endorses minimal water intake which I suspect is a big factor in her constipation. I recommend she increase water intake, aim for atleast 60 oz per day, continue with high fiber diet and  may need to resume miralax if she continues to have harder stools.  EOE: diagnosed on EGD in September, had improvement with PPI BID, though continued with some dysphagia. EGD in December with continued increase in eosinophils though improved from previous exam, she was given 12 weeks budesonide slurry which she  completed without noting much improvement. She is still taking PPI BID, denies any GERD symptoms but having dysphagia around 3-4 times per week still. She was recommended to have repeat EGD again in June. Will discuss case with Dr. Tasia Catchings, for other recommendations prior to repeat EGD. patient may benefit from elimination diet given her ongoing symptoms. Ultimately may need to consider advancing therapy to something like dupixent if EOE persists.   We discussed swollen lymph nodes are likely unrelated to her EGD given time frame of onset. I suspect this could be reactive in setting of sinus drainage, she does endorse some ear pain as well. I encouraged her to discuss symptoms with her PCP for further evaluation.   PLAN:  -repeat EGD June -continue PPI  BID -increase water intake to around 60 oz per day -continue high fiber diet -discuss swollen lymph nodes with PCP -discuss other EOE therapy with Dr. Tasia Catchings (elimination diet/dupixent?)  All questions were answered, patient verbalized understanding and is in agreement with plan as outlined above.   Follow Up: 3 months   Carolyn Fors L. Jeanmarie Hubert, MSN, APRN, AGNP-C Adult-Gerontology Nurse Practitioner [facility]  **addendum: case was discussed with Dr. Tasia Catchings who recommended trialing elimination diet (to include no milk protein). I attempted to connect with the patient but was unable to reach her so I left a detailed VM. Will mail her a copy of the 6 food elimination diet to be done x4-6 weeks and she should call with an update on her symptoms after that time. If she continues to have persistent dysphagia and presence of elevated eosinophils on repeat EGD, will consider starting Dupixent.

## 2023-11-05 ENCOUNTER — Ambulatory Visit (HOSPITAL_COMMUNITY)
Admission: RE | Admit: 2023-11-05 | Discharge: 2023-11-05 | Disposition: A | Discharge status: Home / Self Care | Source: Ambulatory Visit | Attending: Internal Medicine | Admitting: Internal Medicine

## 2023-11-05 ENCOUNTER — Telehealth (INDEPENDENT_AMBULATORY_CARE_PROVIDER_SITE_OTHER): Payer: Self-pay

## 2023-11-05 DIAGNOSIS — R928 Other abnormal and inconclusive findings on diagnostic imaging of breast: Secondary | ICD-10-CM | POA: Insufficient documentation

## 2023-11-05 HISTORY — PX: BREAST BIOPSY: SHX20

## 2023-11-05 MED ORDER — LIDOCAINE-EPINEPHRINE (PF) 1 %-1:200000 IJ SOLN
INTRAMUSCULAR | Status: AC
  Filled 2023-11-05: qty 30

## 2023-11-05 MED ORDER — LIDOCAINE HCL (PF) 2 % IJ SOLN
INTRAMUSCULAR | Status: AC
  Filled 2023-11-05: qty 10

## 2023-11-05 NOTE — Telephone Encounter (Signed)
 11/05/2023: Mailed to the patient today.  Raquel James, NP  Meredith Leeds, CMA Can we mail elimination diet( I have put this on your desk) to the patient. I left a detailed VM for him regarding this.  Thanks!

## 2023-11-06 LAB — SURGICAL PATHOLOGY

## 2023-11-22 ENCOUNTER — Ambulatory Visit: Admitting: Nurse Practitioner

## 2023-12-06 ENCOUNTER — Encounter (INDEPENDENT_AMBULATORY_CARE_PROVIDER_SITE_OTHER): Payer: Self-pay | Admitting: *Deleted

## 2023-12-26 ENCOUNTER — Telehealth: Payer: Self-pay | Admitting: *Deleted

## 2023-12-26 NOTE — Telephone Encounter (Signed)
 Ok to schedule.  Room any. Please tell her to continue PPI for the procedure   Thanks,  Narelle Schoening Faizan Hamp Moreland, MD Gastroenterology and Hepatology [facility]

## 2023-12-26 NOTE — Telephone Encounter (Signed)
 Who is your primary care physician: Dr. Del Favia  Reasons for the EGD: 6 month recall  Are you diabetic? If yes, Type 1 or Type 2?    NO  Do you have a prosthetic or mechanical heart valve? NO  Do you have a pacemaker/defibrillator?   NO  Have you had endocarditis/atrial fibrillation? NO  Have you had joint replacement within the last 12 months?  NO  Do you tend to be constipated or have to use laxatives? NO  Do you have any history of drugs or alchohol?  NO  Do you use supplemental oxygen ?  NO  Have you had a stroke or heart attack within the last 6 months? NO  Do you take weight loss medication?  NO  For female patients: have you had a hysterectomy?  YES                                     are you post menopausal?       NO                                            do you still have your menstrual cycle? NO      Do you take any blood-thinning medications such as: (aspirin , warfarin, Plavix, Aggrenox)  NO  If yes we need the name, milligram, dosage and who is prescribing doctor  Current Outpatient Medications on File Prior to Visit  Medication Sig Dispense Refill   acetaminophen  (TYLENOL ) 500 MG tablet Take 1,000 mg by mouth every 6 (six) hours as needed for mild pain.     aspirin  EC 81 MG tablet Take 1 tablet (81 mg total) by mouth daily. Swallow whole. 90 tablet 3   Budesonide  2 MG/10ML SUSP Take 2 mg by mouth in the morning and at bedtime. FDA approved EOE treatment : Budesonide  oral suspension is 2 mg, twice daily, for 12 weeks . Take budesonide  slowly, over 5 to 10 minutes, and not to eat or drink for 30 minutes after taking the medication. Administered as an oral viscous slurry (4 mg total daily dose, generally divided into twice a day). Viscous budesonide  can be compounded by mixing Pulmicort  Respules with sucralose (Splenda; 10 1-gram packets per 1 mg of budesonide , creating a volume of approximately 8 mL) (Patient not taking: Reported on 10/31/2023) 120 mL 2   carvedilol   (COREG ) 3.125 MG tablet Take 1 tablet (3.125 mg total) by mouth 2 (two) times daily. 180 tablet 3   DULoxetine  (CYMBALTA ) 60 MG capsule Take 60 mg by mouth daily.     ezetimibe  (ZETIA ) 10 MG tablet TAKE 1 TABLET(10 MG) BY MOUTH DAILY 90 tablet 3   isosorbide  mononitrate (IMDUR ) 30 MG 24 hr tablet Take 0.5 tablets (15 mg total) by mouth daily. 45 tablet 1   lidocaine  (LIDODERM ) 5 % 1 patch daily. As needed     nitroGLYCERIN  (NITROSTAT ) 0.4 MG SL tablet DISSOLVE 1 TABLET UNDER THE TONGUE EVERY 5 MINUTES FOR 3 DOSES AS NEEDED FOR CHEST PAIN 25 tablet 3   pantoprazole  (PROTONIX ) 40 MG tablet Take 1 tablet (40 mg total) by mouth 2 (two) times daily. 60 tablet 5   pregabalin (LYRICA) 75 MG capsule Take 75 mg by mouth 2 (two) times daily.     rosuvastatin  (CRESTOR ) 40 MG  tablet Take 1 tablet (40 mg total) by mouth every evening. 90 tablet 3   traMADol  (ULTRAM ) 50 MG tablet Take 50 mg by mouth every 6 (six) hours as needed.     No current facility-administered medications on file prior to visit.    Allergies  Allergen Reactions   Bee Venom Other (See Comments)    Pus sites at injection   Elastic Bandages & [Zinc] Hives   Mango Flavoring Agent (Non-Screening) Hives    All mango   Neosporin [Bacitracin-Polymyxin B]     Pt states she can use this medication    Adhesive [Tape] Itching and Swelling    Please use paper tape   Triple Antibiotic [Bacitracin-Neomycin-Polymyxin] Rash    Primary Insurance Name: [place] medicaid [employer]  Best number where you can be reached: [phone]

## 2023-12-27 NOTE — Telephone Encounter (Signed)
 Spoke with pt. Scheduled for 6/25. Aware will send instructions to her.

## 2023-12-27 NOTE — Telephone Encounter (Signed)
 LMOVM to call back

## 2024-01-17 ENCOUNTER — Ambulatory Visit: Attending: Nurse Practitioner | Admitting: Nurse Practitioner

## 2024-01-17 ENCOUNTER — Encounter: Payer: Self-pay | Admitting: Nurse Practitioner

## 2024-01-17 VITALS — BP 112/60 | HR 68 | Ht 63.0 in | Wt 186.2 lb

## 2024-01-17 DIAGNOSIS — R0609 Other forms of dyspnea: Secondary | ICD-10-CM | POA: Diagnosis present

## 2024-01-17 DIAGNOSIS — Z86718 Personal history of other venous thrombosis and embolism: Secondary | ICD-10-CM | POA: Insufficient documentation

## 2024-01-17 DIAGNOSIS — I6529 Occlusion and stenosis of unspecified carotid artery: Secondary | ICD-10-CM | POA: Insufficient documentation

## 2024-01-17 DIAGNOSIS — Z01810 Encounter for preprocedural cardiovascular examination: Secondary | ICD-10-CM | POA: Insufficient documentation

## 2024-01-17 DIAGNOSIS — I25119 Atherosclerotic heart disease of native coronary artery with unspecified angina pectoris: Secondary | ICD-10-CM | POA: Insufficient documentation

## 2024-01-17 DIAGNOSIS — E785 Hyperlipidemia, unspecified: Secondary | ICD-10-CM | POA: Insufficient documentation

## 2024-01-17 MED ORDER — ROSUVASTATIN CALCIUM 40 MG PO TABS: 40.0000 mg | ORAL_TABLET | Freq: Every evening | ORAL | 3 refills | Status: AC

## 2024-01-17 MED ORDER — ISOSORBIDE MONONITRATE ER 30 MG PO TB24: 15.0000 mg | ORAL_TABLET | Freq: Every day | ORAL | 1 refills | Status: DC

## 2024-01-17 MED ORDER — CARVEDILOL 3.125 MG PO TABS
3.1250 mg | ORAL_TABLET | Freq: Two times a day (BID) | ORAL | 3 refills | Status: AC
Start: 2024-01-17 — End: ?

## 2024-01-17 NOTE — Progress Notes (Unsigned)
 Cardiology Office Note:  .   Date:  10/10/2023 ID:  Carolyn Gaines, DOB 1966/07/24, MRN [id number] PCP: Omie Bickers, MD  [facility] Providers Cardiologist:  Armida Lander, MD    History of Present Illness: .   Carolyn Gaines is a 58 y.o. female with a PMH of CAD, s/p CABG in 2018, hx of chest pain, DOE, HLD, hx of DVT, and carotid artery stenosis, who presents today for 67-month follow-up.  Previous cardiovascular history of STEMI in 2017, received drug-eluting stent to the RCA.  She had residual LAD disease, 60 to 70% at the time, was medically managed.  Underwent cardiac catheterization in 2018-see full report below.  Underwent CABG in 2018 (LIMA-LAD, SVG-OM, SVG-RCA).  Was noted that she had a history of chronic chest wall pain since CABG. Sternal wires were removed in 2018, did improve some symptoms.  Last seen by Dr. Armida Lander on May 03, 2023.  She noted recent DOE of unclear etiology.  She had gained 23 pounds since previous year due to being sedentary after knee surgery.  Echo was arranged and was overall unremarkable-see below.  Today she presents for 62-month follow-up appointment.  She admits to sharp chest pains along her mid chest, denies any exertional triggers, however typically noticed when picking up her dogs. Has this has been chronic since her surgery, has been worse recently. Says it feels like a pulled muscle. Did take NTG once that helped. Rates this as 8/10 when it occurs. Left arm also is occasionally sore with this. DOE remains stable. Denies any palpitations, syncope, presyncope, dizziness, orthopnea, PND, swelling or significant weight changes, acute bleeding, or claudication.   Refill imdur , coreg , and rosuvstatin.    No more chest pain.      Pre-opp clearance.   ROS: Negative. See HPI.   Studies Reviewed: Aaron Aas    EKG:      Echo 05/2023:   1. Left ventricular ejection fraction, by estimation, is 55 to 60%. The  left ventricle has normal  function. The left ventricle has no regional  wall motion abnormalities. Left ventricular diastolic parameters were  normal.   2. Right ventricular systolic function is normal. The right ventricular  size is normal. There is normal pulmonary artery systolic pressure.   3. The mitral valve is normal in structure. No evidence of mitral valve  regurgitation. No evidence of mitral stenosis.   4. The aortic valve is tricuspid. There is mild calcification of the  aortic valve. There is mild thickening of the aortic valve. Aortic valve  regurgitation is not visualized. Aortic valve sclerosis is present, with  no evidence of aortic valve stenosis.   5. The inferior vena cava is normal in size with greater than 50%  respiratory variability, suggesting right atrial pressure of 3 mmHg.  Carotid duplex 04/2023:  Impression: No hemodynamically significant stenosis by duplex criteria in the extracranial cerebrovascular circulation.  Lexiscan  10/2020:  Lexiscan  stress is electrically negative Myovue scan shows normal perfusion No significant ischemia or scar LVEF calculated at 60% Overall low risk study.  LHC 09/2016:  Mid LAD lesion, 50 %stenosed. Ost LM lesion, 85 %stenosed. Mid RCA-1 lesion, 50 %stenosed. Mid RCA-2 lesion, 0 %stenosed. A drug eluting . Prox RCA lesion, 20 %stenosed.   Severe native CAD with 80-90% ostial stenosis of the left main coronary artery which did not improve following IC nitroglycerin  administration; 50% mid LAD stenoses; normal left circumflex vessel; and patent mid RCA stent with 45-50% smooth eccentric  stenosis proximal to the stented segment and 20% proximal irregularity.   Mild  residual LV dysfunction with an ejection fraction of 45-50% with mild mid inferior hypocontractility.   RECOMMENDATION: The patient has been on full dose Brilinta  since her DES stent implantation in July 2018.  Surgical consultation was obtained with Dr. Nicanor Barge.  Brilinta  will be  discontinued.  The patient will be stabilized on IV NTG with plans to reinstitute heparin . Plan for CABG surgery later this week following several days of Brilinta  washout.   If the patient becomes unstable, consider emergent left main stenting.  Physical Exam:   VS:  There were no vitals taken for this visit.   Wt Readings from Last 3 Encounters:  10/31/23 187 lb 8 oz (85 kg)  10/10/23 188 lb (85.3 kg)  08/06/23 199 lb (90.3 kg)    GEN: Well nourished, well developed in no acute distress NECK: No JVD; No carotid bruits CARDIAC: S1/S2, RRR, no murmurs, rubs, gallops RESPIRATORY:  Clear to auscultation without rales, wheezing or rhonchi  ABDOMEN: Soft, non-tender, non-distended EXTREMITIES:  No edema; No deformity   ASSESSMENT AND PLAN: .    Chest pain of uncertain etiology, CAD, s/p CABG History of chronic and longstanding chest pain since surgery in 2018, has been worse recently per her report.  Her description sounds musculoskeletal.  It was previously recommended if her NST came back low risk, could trial Imdur  based on her symptoms.  Will initiate Imdur  15 mg daily.  Continue aspirin , Zetia , carvedilol , rosuvastatin , and nitroglycerin  as needed.  Will provide refill of nitroglycerin .  Care and ED precautions discussed. Heart healthy diet and regular cardiovascular exercise encouraged.  If no improvement in symptoms after trialing medication, plan to repeat NST.  {Click Here to Calculate RCRI      :409811914}  { Click Here to Calculate DASI      :[id number]} Carolyn Gaines's perioperative risk of a major cardiac event is 0.9% according to the Revised Cardiac Risk Index (RCRI).  Therefore, she is at low risk for perioperative complications.   Her functional capacity is fair at 4.4 METs according to the Duke Activity Status Index (DASI). Recommendations: The patient requires an echocardiogram before a disposition can be made regarding surgical risk.                Antiplatelet and/or  Anticoagulation Recommendations: {Antiplatelet Recommendations                  :21036016} {Anticoagulation Recommendations           :08657846}  Before I order a test...  Worsening shob....  lexiscan  ....    Records request....      End or day, worn out.... especifally watching grandson.... short of breath, sore along mid chest at times,,, picks up grandson.   2.  Carotid artery stenosis, hyperlipidemia Most recent carotid Doppler in October 24 was negative for any significant carotid artery stenosis.  Most recent LDL 49.  No medication changes at this time.  Denies any symptoms. Heart healthy diet and regular cardiovascular exercise encouraged.   3.  DOE Echocardiogram from November 2024 was normal.  Etiology unclear.  Could most likely be due to deconditioning after knee surgery.  Will continue to monitor.   4.  History of DVT Most recent Doppler last fall showed resolution of DVT.  No medication changes at this time.  Continue follow-up with PCP.  Dispo: Follow-up with me/[facility] in 4 to 6 weeks or sooner if anything changes.  Signed, Lasalle Pointer, NP

## 2024-01-17 NOTE — Patient Instructions (Signed)

## 2024-01-22 ENCOUNTER — Ambulatory Visit (HOSPITAL_COMMUNITY): Admitting: Anesthesiology

## 2024-01-22 ENCOUNTER — Encounter (HOSPITAL_COMMUNITY): Payer: Self-pay | Admitting: Gastroenterology

## 2024-01-22 ENCOUNTER — Encounter (HOSPITAL_COMMUNITY)
Admission: RE | Disposition: A | Discharge status: Home / Self Care | Payer: Self-pay | Source: Ambulatory Visit | Attending: Gastroenterology

## 2024-01-22 ENCOUNTER — Other Ambulatory Visit: Payer: Self-pay

## 2024-01-22 ENCOUNTER — Ambulatory Visit (HOSPITAL_COMMUNITY)
Admission: RE | Admit: 2024-01-22 | Discharge: 2024-01-22 | Disposition: A | Discharge status: Home / Self Care | Source: Ambulatory Visit | Attending: Gastroenterology | Admitting: Gastroenterology

## 2024-01-22 DIAGNOSIS — I2510 Atherosclerotic heart disease of native coronary artery without angina pectoris: Secondary | ICD-10-CM | POA: Insufficient documentation

## 2024-01-22 DIAGNOSIS — I25119 Atherosclerotic heart disease of native coronary artery with unspecified angina pectoris: Secondary | ICD-10-CM | POA: Diagnosis not present

## 2024-01-22 DIAGNOSIS — Z955 Presence of coronary angioplasty implant and graft: Secondary | ICD-10-CM | POA: Diagnosis not present

## 2024-01-22 DIAGNOSIS — Z86718 Personal history of other venous thrombosis and embolism: Secondary | ICD-10-CM | POA: Insufficient documentation

## 2024-01-22 DIAGNOSIS — K2289 Other specified disease of esophagus: Secondary | ICD-10-CM | POA: Insufficient documentation

## 2024-01-22 DIAGNOSIS — K209 Esophagitis, unspecified without bleeding: Secondary | ICD-10-CM | POA: Diagnosis not present

## 2024-01-22 DIAGNOSIS — I252 Old myocardial infarction: Secondary | ICD-10-CM | POA: Diagnosis not present

## 2024-01-22 DIAGNOSIS — M797 Fibromyalgia: Secondary | ICD-10-CM | POA: Insufficient documentation

## 2024-01-22 DIAGNOSIS — Z951 Presence of aortocoronary bypass graft: Secondary | ICD-10-CM | POA: Insufficient documentation

## 2024-01-22 DIAGNOSIS — I10 Essential (primary) hypertension: Secondary | ICD-10-CM | POA: Diagnosis not present

## 2024-01-22 DIAGNOSIS — K200 Eosinophilic esophagitis: Secondary | ICD-10-CM

## 2024-01-22 DIAGNOSIS — Z7901 Long term (current) use of anticoagulants: Secondary | ICD-10-CM | POA: Diagnosis not present

## 2024-01-22 DIAGNOSIS — K219 Gastro-esophageal reflux disease without esophagitis: Secondary | ICD-10-CM | POA: Insufficient documentation

## 2024-01-22 DIAGNOSIS — R1310 Dysphagia, unspecified: Secondary | ICD-10-CM | POA: Diagnosis present

## 2024-01-22 DIAGNOSIS — K222 Esophageal obstruction: Secondary | ICD-10-CM

## 2024-01-22 DIAGNOSIS — Z87891 Personal history of nicotine dependence: Secondary | ICD-10-CM | POA: Diagnosis not present

## 2024-01-22 HISTORY — PX: ESOPHAGOGASTRODUODENOSCOPY: SHX5428

## 2024-01-22 SURGERY — EGD (ESOPHAGOGASTRODUODENOSCOPY)
Anesthesia: General

## 2024-01-22 MED ORDER — PHENYLEPHRINE 80 MCG/ML (10ML) SYRINGE FOR IV PUSH (FOR BLOOD PRESSURE SUPPORT)
PREFILLED_SYRINGE | INTRAVENOUS | Status: AC
  Filled 2024-01-22: qty 10

## 2024-01-22 MED ORDER — LIDOCAINE 2% (20 MG/ML) 5 ML SYRINGE
INTRAMUSCULAR | Status: DC | PRN
  Administered 2024-01-22: 100 mg via INTRAVENOUS

## 2024-01-22 MED ORDER — PROPOFOL 10 MG/ML IV BOLUS
INTRAVENOUS | Status: DC | PRN
  Administered 2024-01-22 (×2): 30 mg via INTRAVENOUS

## 2024-01-22 MED ORDER — PROPOFOL 500 MG/50ML IV EMUL
INTRAVENOUS | Status: DC | PRN
Start: 2024-01-22 — End: 2024-01-22
  Administered 2024-01-22: 150 ug/kg/min via INTRAVENOUS

## 2024-01-22 MED ORDER — PHENYLEPHRINE 80 MCG/ML (10ML) SYRINGE FOR IV PUSH (FOR BLOOD PRESSURE SUPPORT)
PREFILLED_SYRINGE | INTRAVENOUS | Status: DC | PRN
  Administered 2024-01-22 (×2): 160 ug via INTRAVENOUS

## 2024-01-22 MED ORDER — LACTATED RINGERS IV SOLN: INTRAVENOUS | Status: DC

## 2024-01-22 NOTE — Anesthesia Preprocedure Evaluation (Signed)
Anesthesia Evaluation  Patient identified by MRN, date of birth, ID band Patient awake    Reviewed: Allergy & Precautions, H&P , NPO status , Patient's Chart, lab work & pertinent test results, reviewed documented beta blocker date and time   Airway Mallampati: II  TM Distance: >3 FB Neck ROM: full    Dental no notable dental hx.    Pulmonary neg pulmonary ROS, shortness of breath, former smoker   Pulmonary exam normal breath sounds clear to auscultation       Cardiovascular Exercise Tolerance: Good hypertension, + angina  + CAD and + Past MI  negative cardio ROS  Rhythm:regular Rate:Normal     Neuro/Psych  Headaches, Seizures -,  PSYCHIATRIC DISORDERS Anxiety Depression     Neuromuscular disease negative neurological ROS  negative psych ROS   GI/Hepatic negative GI ROS, Neg liver ROS, PUD,GERD  ,,  Endo/Other  negative endocrine ROS    Renal/GU negative Renal ROS  negative genitourinary   Musculoskeletal   Abdominal   Peds  Hematology negative hematology ROS (+) Blood dyscrasia, anemia   Anesthesia Other Findings   Reproductive/Obstetrics negative OB ROS                             Anesthesia Physical Anesthesia Plan  ASA: 3  Anesthesia Plan: General   Post-op Pain Management:    Induction:   PONV Risk Score and Plan: Propofol infusion  Airway Management Planned:   Additional Equipment:   Intra-op Plan:   Post-operative Plan:   Informed Consent: I have reviewed the patients History and Physical, chart, labs and discussed the procedure including the risks, benefits and alternatives for the proposed anesthesia with the patient or authorized representative who has indicated his/her understanding and acceptance.     Dental Advisory Given  Plan Discussed with: CRNA  Anesthesia Plan Comments:        Anesthesia Quick Evaluation

## 2024-01-22 NOTE — H&P (Signed)
 Primary Care Physician:  Shona Norleen PEDLAR, MD Primary Gastroenterologist:  Dr. Cinderella  Pre-Procedure History & Physical: HPI: Carolyn Gaines is a 58 y.o. 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 Here for EGD with biopsies and dilation   She is still having some issues with continued dysphagia. She completed the budesonide  slurry and is taking PPI BID     Last Colonoscopy:2018 The entire examined colon is normal on direct and retroflexion views. - No specimens collected. Suggested repeat 10 years   Last Endoscopy:07/16/23 - Esophageal mucosal changes secondary to                            eosinophilic esophagitis. Dilated.                           - Erythematous mucosa in the antrum. Biopsied.                           - Biopsies were taken with a cold forceps for                            evaluation of eosinophilic esophagitis.                           -Improved examination for EOE and healed duodenal                            ulcers  A. STOMACH, BIOPSY: -  Antral and oxyntic mucosa with mild to moderate chronic focal minimally active gastritis. -  An immunohistochemical stain for Helicobacter pylori organisms is negative.  B. ESOPHAGUS, DISTAL, BIOPSY: -  Squamocolumnar junctional mucosa with basal cell hyperplasia, papillary elongation, and mild activity with increased intraepithelial eosinophils (up to 15/hpf) and increased intraepithelial lymphocytes. -  A PAS stain is negative for fungal forms.  C. ESOPHAGUS, PROXIMAL, BIOPSY: -  Squamous mucosa with basal cell hyperplasia, papillary elongation and increased intraepithelial eosinophils (up to 10/hpf) as well as increased intraepithelial lymphocytes. -  A PAS stain is negative for fungal forms.     Past Medical History:  Diagnosis Date   Anemia    low iron   Anxiety    Arthritis    Bleeding ulcer     age 32. spent one week in hospital with bleeding ulcers.   Coronary artery disease    a. s/p prior stenting to RCA b. CABG in 09/2016 with LIMA-LAD, SVG-OM and SVG-RCA c. low-risk NST in 01/2019   Depression    Dyspnea    Fibromyalgia    Gallstones    GERD (gastroesophageal reflux disease)    Headache    migraines   MI (myocardial infarction) (HCC) 01/2016   Peptic ulcer    Seizures (HCC)    as a baby    Past Surgical History:  Procedure Laterality Date   ABDOMINAL HYSTERECTOMY     BIOPSY  04/18/2023   Procedure: BIOPSY;  Surgeon: Cinderella Deatrice FALCON, MD;  Location: [facility];  Service: Endoscopy;;   BIOPSY  07/16/2023   Procedure: BIOPSY;  Surgeon: Cinderella Deatrice FALCON, MD;  [facility];  Service: Endoscopy;;   BREAST BIOPSY  Left 11/05/2023   US  LT BREAST BX W LOC DEV 1ST LESION IMG BX SPEC US  GUIDE 11/05/2023 Lennon Nest, MD [facility]   CARDIAC CATHETERIZATION N/A 02/19/2016   Procedure: Left Heart Cath and Coronary Angiography;  Surgeon: Gordy Bergamo, MD;  Location: [facility] INVASIVE CV LAB;  Service: Cardiovascular;  Laterality: N/A;   CARDIAC CATHETERIZATION N/A 02/19/2016   Procedure: Coronary Stent Intervention;  Surgeon: Gordy Bergamo, MD;  Location: [facility] INVASIVE CV LAB;  Service: Cardiovascular;  Laterality: N/A;   CESAREAN SECTION     x 2   CHOLECYSTECTOMY N/A 01/25/2017   Procedure: LAPAROSCOPIC CHOLECYSTECTOMY;  Surgeon: Mavis Anes, MD;  Location: [facility];  Service: General;  Laterality: N/A;   COLONOSCOPY     CORONARY ARTERY BYPASS GRAFT N/A 10/26/2016   Procedure: CORONARY ARTERY BYPASS GRAFTING (CABG), ON PUMP, TIMES THREE, USING LEFT INTERNAL MAMMARY ARTERY AND ENDOSCOPICALLY HARVESTED RIGHT GREATER SAPHENOUS VEIN;  Surgeon: Dorise MARLA Fellers, MD;  Location: [facility];  Service: Open Heart Surgery;  Laterality: N/A;  LIMA-LAD SVG-RCA SVG-OM   ESOPHAGOGASTRODUODENOSCOPY     ESOPHAGOGASTRODUODENOSCOPY (EGD) WITH PROPOFOL  N/A 04/18/2023   Procedure:  ESOPHAGOGASTRODUODENOSCOPY (EGD) WITH PROPOFOL ;  Surgeon: Cinderella Deatrice FALCON, MD;  [facility]: [facility];  Service: Endoscopy;  Laterality: N/A;  2:15pm;asa 3, LM to see if pt can move up   ESOPHAGOGASTRODUODENOSCOPY (EGD) WITH PROPOFOL  N/A 07/16/2023   Procedure: ESOPHAGOGASTRODUODENOSCOPY (EGD) WITH PROPOFOL ;  Surgeon: Cinderella Deatrice FALCON, MD;  [facility];  Service: Endoscopy;  Laterality: N/A;  8:15AM;ASA 3   LEFT HEART CATH AND CORONARY ANGIOGRAPHY N/A 10/22/2016   Procedure: Left Heart Cath and Coronary Angiography;  Surgeon: Debby DELENA Sor, MD;  Location: [facility] INVASIVE CV LAB;  Service: Cardiovascular;  Laterality: N/A;   SAVORY DILATION  07/16/2023   Procedure: SAVORY DILATION;  Surgeon: Cinderella Deatrice FALCON, MD;  [facility]: [facility];  Service: Endoscopy;;   STERNAL WIRES REMOVAL N/A 07/11/2017   Procedure: STERNAL WIRES REMOVAL;  Surgeon: Fellers Dorise MARLA, MD;  Location: [facility] OR;  Service: Thoracic;  Laterality: N/A;   TEE WITHOUT CARDIOVERSION N/A 10/26/2016   Procedure: TRANSESOPHAGEAL ECHOCARDIOGRAM (TEE);  Surgeon: Dorise MARLA Fellers, MD;  Location: [facility] OR;  Service: Open Heart Surgery;  Laterality: N/A;   WRIST FRACTURE SURGERY Left    3 times    Prior to Admission medications   Medication Sig Start Date End Date Taking? Authorizing Provider  acetaminophen  (TYLENOL ) 500 MG tablet Take 1,000 mg by mouth every 6 (six) hours as needed for mild pain.   Yes [provider]  aspirin  EC 81 MG tablet Take 1 tablet (81 mg total) by mouth daily. Swallow whole. 05/03/23  Yes BranchDorn FALCON, MD  carvedilol  (COREG ) 3.125 MG tablet Take 1 tablet (3.125 mg total) by mouth 2 (two) times daily. 01/17/24  Yes Miriam Norris, NP  DULoxetine  (CYMBALTA ) 60 MG capsule Take 60 mg by mouth daily.   Yes [provider]  ezetimibe  (ZETIA ) 10 MG tablet TAKE 1 TABLET(10 MG) BY MOUTH DAILY 07/10/23  Yes Parthenia Olivia HERO, PA-C  isosorbide  mononitrate (IMDUR ) 30 MG 24 hr tablet Take 0.5  tablets (15 mg total) by mouth daily. 01/17/24 04/16/24 Yes Miriam Norris, NP  lidocaine  (LIDODERM ) 5 % 1 patch daily. As needed 02/06/23  Yes [provider]  pregabalin (LYRICA) 75 MG capsule Take 75 mg by mouth 2 (two) times daily. 02/26/22  Yes [provider]  rosuvastatin  (CRESTOR ) 40 MG tablet Take 1 tablet (40 mg total) by  mouth every evening. 01/17/24  Yes Miriam Norris, NP  traMADol  (ULTRAM ) 50 MG tablet Take 50 mg by mouth every 6 (six) hours as needed. 09/20/23  Yes [provider]  nitroGLYCERIN  (NITROSTAT ) 0.4 MG SL tablet DISSOLVE 1 TABLET UNDER THE TONGUE EVERY 5 MINUTES FOR 3 DOSES AS NEEDED FOR CHEST PAIN 10/10/23   Miriam Norris, NP  pantoprazole  (PROTONIX ) 40 MG tablet Take 1 tablet (40 mg total) by mouth 2 (two) times daily. 04/23/23 01/17/24  Cinderella Deatrice FALCON, MD    Allergies as of 12/27/2023 - Review Complete 10/31/2023  Allergen Reaction Noted   Bee venom Other (See Comments) 02/19/2016   Elastic bandages & [zinc] Hives 10/24/2018   Mango flavoring agent (non-screening) Hives 11/03/2018   Neosporin [bacitracin-polymyxin b]  04/02/2023   Adhesive [tape] Itching and Swelling 02/19/2016   Triple antibiotic [bacitracin-neomycin-polymyxin] Rash 02/19/2016    Family History  Problem Relation Age of Onset   COPD Mother    Arthritis Mother    Heart disease Mother    Stroke Mother    Psoriasis Father    Heart disease Father    Breast cancer Daughter    Diabetes Maternal Aunt    Diabetes Maternal Aunt    Diabetes Maternal Grandmother     Social History   Socioeconomic History   Marital status: Widowed    Spouse name: Not on file   Number of children: 2   Years of education: Not on file   Highest education level: Not on file  Occupational History   Occupation: Education officer, community  Tobacco Use   Smoking status: Former    Current packs/day: 0.00    Types: Cigarettes    Quit date: 03/21/2011    Years since quitting: 12.8    Smokeless tobacco: Never  Vaping Use   Vaping status: Never Used  Substance and Sexual Activity   Alcohol use: Yes    Comment: rare   Drug use: No   Sexual activity: Yes    Birth control/protection: Surgical    Comment: hyst  Other Topics Concern   Not on file  Social History Narrative   Not on file   Social Drivers of Health   Financial Resource Strain: Not on file  Food Insecurity: Not on file  Transportation Needs: Not on file  Physical Activity: Not on file  Stress: Not on file  Social Connections: Not on file  Intimate Partner Violence: Not on file    Review of Systems: See HPI, otherwise negative ROS  Physical Exam: Vital signs in last 24 hours: Temp:  [98.4 F (36.9 C)] 98.4 F (36.9 C) (06/25 1204) Pulse Rate:  [72] 72 (06/25 1204) Resp:  [18] 18 (06/25 1204) BP: (110)/(71) 110/71 (06/25 1204) SpO2:  [93 %] 93 % (06/25 1204)   General:   Alert,  Well-developed, well-nourished, pleasant and cooperative in NAD Head:  Normocephalic and atraumatic. Eyes:  Sclera clear, no icterus.   Conjunctiva pink. Ears:  Normal auditory acuity. Nose:  No deformity, discharge,  or lesions. Msk:  Symmetrical without gross deformities. Normal posture. Extremities:  Without clubbing or edema. Neurologic:  Alert and  oriented x4;  grossly normal neurologically. Skin:  Intact without significant lesions or rashes. Psych:  Alert and cooperative. Normal mood and affect.  Impression/Plan: Carolyn Gaines is a 58 y.o. 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 Here for EGD with biopsies and dilation  Patient had self resolving chest pain prior to the procedure . Patient has been seeing cardiology , ;last 6 days for similar complaints and has been on medical management . There impression is this is musculoskeletal pain . EKG was obtained without any changes   Patient  was reassessed by Dr Kendell and myself twice   The risks of the procedure including infection, bleed, or perforation as well as benefits, limitations, alternatives and imponderables have been reviewed with the patient. Questions have been answered. All parties agreeable.

## 2024-01-22 NOTE — Op Note (Signed)
 [facility] Patient Name: Carolyn Gaines Procedure Date: 01/22/2024 1:00 PM MRN: [id number] Date of Birth: [birth year] Attending MD: Deatrice Dine , MD, [id number] CSN: [id number] Age: 58 Admit Type: Outpatient Procedure:                Upper GI endoscopy Indications:              Dysphagia, Follow-up of eosinophilic esophagitis,                            For therapy of eosinophilic esophagitis Providers:                Deatrice Dine, MD, Rosina Sprague, [place] Wilson,                            Technician Referring MD:              Medicines:                Monitored Anesthesia Care Complications:            No immediate complications. Estimated blood loss:                            None. Estimated Blood Loss:     Estimated blood loss was minimal. Procedure:                Pre-Anesthesia Assessment:                           - Prior to the procedure, a History and Physical                            was performed, and patient medications and                            allergies were reviewed. [name] tolerance of                            previous anesthesia was also reviewed. The risks                            and benefits of the procedure and the sedation                            options and risks were discussed with the patient.                            All questions were answered, and informed consent                            was obtained. Prior Anticoagulants: The patient has                            taken no anticoagulant or antiplatelet agents. ASA                            Grade Assessment:  II - A patient with mild systemic                            disease. After reviewing the risks and benefits,                            the patient was deemed in satisfactory condition to                            undergo the procedure.                           After obtaining informed consent, the endoscope was                            passed under direct vision.  Throughout the                            procedure, the patient's blood pressure, pulse, and                            oxygen  saturations were monitored continuously. The                            [id number]-H190 ([id number]) scope was introduced through the                            mouth, and advanced to the second part of duodenum.                            The upper GI endoscopy was accomplished without                            difficulty. The patient tolerated the procedure                            well. Scope In: 1:28:39 PM Scope Out: 1:37:50 PM Total Procedure Duration: 0 hours 9 minutes 11 seconds  Findings:      Mucosal changes including longitudinal furrows and small-caliber       esophagus were found in the entire esophagus. Esophageal findings were       graded using the Eosinophilic Esophagitis Endoscopic Reference Score       (EoE-EREFS) as: Edema Grade 1 Present (decreased clarity or absence of       vascular markings), Rings Grade 1 Mild (subtle circumferential ridges       seen on esophageal distension), Exudates Grade 0 None (no white lesions       seen) and Furrows Grade 1 Mild (vertical lines without visible depth).       Biopsies were obtained from the proximal and distal esophagus with cold       forceps for histology of suspected eosinophilic esophagitis. A guidewire       was placed and the scope was withdrawn. Dilation was performed with a       Savary dilator with moderate resistance at 18 mm. The dilation site was  examined following endoscope reinsertion and showed moderate mucosal       disruption, complete resolution of luminal narrowing and no perforation.      The entire examined stomach was normal.      The duodenal bulb and second portion of the duodenum were normal. Impression:               - Esophageal mucosal changes consistent with                            eosinophilic esophagitis. Dilated.                           - Normal stomach.                            - Normal duodenal bulb and second portion of the                            duodenum.                           - Biopsies were taken with a cold forceps for                            evaluation of eosinophilic esophagitis.                           -Examination appears much improved , but if                            histologically if patient has persistent                            esonophilia and patient has dysphagia would                            consider adding Dupilimumab Moderate Sedation:      Per Anesthesia Care Recommendation:           - Patient has a contact number available for                            emergencies. The signs and symptoms of potential                            delayed complications were discussed with the                            patient. Return to normal activities tomorrow.                            Written discharge instructions were provided to the                            patient.                           -  Resume previous diet.                           - Continue present medications.                           - Await pathology results.                           - Repeat upper endoscopy PRN.                           -c/w PPI BID                           -if histologically if patient has persistent                            esonophilia and patient has dysphagia would                            consider adding Dupilimumab                           -Avoid cow milk protein ( 1 food elimination ) Procedure Code(s):        --- Professional ---                           [phone], Esophagogastroduodenoscopy, flexible,                            transoral; with insertion of guide wire followed by                            passage of dilator(s) through esophagus over guide                            wire                           43239, 59, Esophagogastroduodenoscopy, flexible,                            transoral; with biopsy,  single or multiple Diagnosis Code(s):        --- Professional ---                           K22.89, Other specified disease of esophagus                           R13.10, Dysphagia, unspecified                           K20.0, Eosinophilic esophagitis CPT copyright 2022 American Medical Association. All rights reserved. The codes documented in this report are preliminary and upon coder review may  be revised to meet current compliance requirements. Deatrice Dine, MD Deatrice Dine, MD 01/22/2024 1:46:54 PM This report has been  signed electronically. Number of Addenda: 0

## 2024-01-22 NOTE — Progress Notes (Signed)
 While assessing pain, pt began to experience sharp, L sided chest pain. States this pain has been going on intermittently for several months. MD Ahmed and MD Kendell made aware. 12-lead EKG captured. Proceed with procedure per MD Kiel. Upon entering procedure room pt states the pain has resolved.

## 2024-01-22 NOTE — Transfer of Care (Signed)
 Immediate Anesthesia Transfer of Care Note  Patient: Carolyn Gaines  Procedure(s) Performed: EGD (ESOPHAGOGASTRODUODENOSCOPY)  Patient Location: PACU  Anesthesia Type:MAC  Level of Consciousness: oriented, drowsy, and patient cooperative  Airway & Oxygen  Therapy: Patient Spontanous Breathing  Post-op Assessment: Report given to RN and Post -op Vital signs reviewed and stable  Post vital signs: Reviewed and stable  Last Vitals:  Vitals Value Taken Time  BP 88/46 01/22/24 13:53  Temp 36.6 C 01/22/24 13:44  Pulse 68 01/22/24 13:53  Resp 16 01/22/24 13:53  SpO2 96 % 01/22/24 13:50    Last Pain:  Vitals:   01/22/24 1344  TempSrc: Axillary  PainSc: 0-No pain      Patients Stated Pain Goal: 4 (01/22/24 1204)  Complications: No notable events documented.

## 2024-01-22 NOTE — Discharge Instructions (Addendum)
  Discharge instructions Please read the instructions outlined below and refer to this sheet in the next few weeks. These discharge instructions provide you with general information on caring for yourself after you leave the hospital. Your doctor may also give you specific instructions. While your treatment has been planned according to the most current medical practices available, unavoidable complications occasionally occur. If you have any problems or questions after discharge, please call your doctor. ACTIVITY You may resume your regular activity but move at a slower pace for the next 24 hours.  Take frequent rest periods for the next 24 hours.  Walking will help expel (get rid of) the air and reduce the bloated feeling in your abdomen.  No driving for 24 hours (because of the anesthesia (medicine) used during the test).  You may shower.  Do not sign any important legal documents or operate any machinery for 24 hours (because of the anesthesia used during the test).  NUTRITION Drink plenty of fluids.  You may resume your normal diet.  Begin with a light meal and progress to your normal diet.  Avoid alcoholic beverages for 24 hours or as instructed by your caregiver.  MEDICATIONS You may resume your normal medications unless your caregiver tells you otherwise.  WHAT YOU CAN EXPECT TODAY You may experience abdominal discomfort such as a feeling of fullness or "gas" pains.  FOLLOW-UP Your doctor will discuss the results of your test with you.  SEEK IMMEDIATE MEDICAL ATTENTION IF ANY OF THE FOLLOWING OCCUR: Excessive nausea (feeling sick to your stomach) and/or vomiting.  Severe abdominal pain and distention (swelling).  Trouble swallowing.  Temperature over 101 F (37.8 C).  Rectal bleeding or vomiting of blood.     -continue with Protonix  40 mg twice daily -Avoid cow milk protein ( 1 food elimination ), dairy  -See you in clinic on 01/28/2024   I hope you have a great rest of your  week!   Schylar Allard Faizan Aryn Kops , M.D.. Gastroenterology and Hepatology [facility]

## 2024-01-23 ENCOUNTER — Encounter (HOSPITAL_COMMUNITY): Payer: Self-pay | Admitting: Gastroenterology

## 2024-01-23 LAB — SURGICAL PATHOLOGY

## 2024-01-24 ENCOUNTER — Ambulatory Visit (INDEPENDENT_AMBULATORY_CARE_PROVIDER_SITE_OTHER): Payer: Self-pay | Admitting: Gastroenterology

## 2024-01-24 ENCOUNTER — Other Ambulatory Visit (HOSPITAL_COMMUNITY): Payer: Self-pay | Admitting: Radiology

## 2024-01-24 DIAGNOSIS — J439 Emphysema, unspecified: Secondary | ICD-10-CM

## 2024-01-24 NOTE — Anesthesia Postprocedure Evaluation (Signed)
 Anesthesia Post Note  Patient: Carolyn Gaines  Procedure(s) Performed: EGD (ESOPHAGOGASTRODUODENOSCOPY)  Patient location during evaluation: Phase II Anesthesia Type: General Level of consciousness: awake Pain management: pain level controlled Vital Signs Assessment: post-procedure vital signs reviewed and stable Respiratory status: spontaneous breathing and respiratory function stable Cardiovascular status: blood pressure returned to baseline and stable Postop Assessment: no headache and no apparent nausea or vomiting Anesthetic complications: no Comments: Late entry   No notable events documented.   Last Vitals:  Vitals:   01/22/24 1357 01/22/24 1401  BP: (!) 89/58 (!) 94/52  Pulse: 63   Resp: 16   Temp:    SpO2: 96%     Last Pain:  Vitals:   01/22/24 1344  TempSrc: Axillary  PainSc: 0-No pain                 [name]

## 2024-01-28 ENCOUNTER — Ambulatory Visit (INDEPENDENT_AMBULATORY_CARE_PROVIDER_SITE_OTHER): Admitting: Gastroenterology

## 2024-01-28 ENCOUNTER — Encounter (INDEPENDENT_AMBULATORY_CARE_PROVIDER_SITE_OTHER): Payer: Self-pay | Admitting: Gastroenterology

## 2024-01-28 VITALS — BP 93/59 | HR 73 | Temp 98.3°F | Ht 63.0 in | Wt 183.9 lb

## 2024-01-28 DIAGNOSIS — R1310 Dysphagia, unspecified: Secondary | ICD-10-CM

## 2024-01-28 DIAGNOSIS — R1319 Other dysphagia: Secondary | ICD-10-CM | POA: Insufficient documentation

## 2024-01-28 DIAGNOSIS — K200 Eosinophilic esophagitis: Secondary | ICD-10-CM

## 2024-01-28 DIAGNOSIS — K5900 Constipation, unspecified: Secondary | ICD-10-CM

## 2024-01-28 DIAGNOSIS — K5904 Chronic idiopathic constipation: Secondary | ICD-10-CM

## 2024-01-28 NOTE — Progress Notes (Signed)
Patient result letter mailed procedure note and pathology result faxed to PCP

## 2024-01-28 NOTE — Patient Instructions (Signed)
-  continue Protonix  40mg  twice daily, we may consider decreasing to once a day at follow up visit if you are feeling well -continue to avoid milk protein in diet  -can try over the counter probiotic, aim for one with atleast 3-4 strains of bacteria  -continue high water, fruit intake -miralax  as needed for constipation  Follow up 4 months  It was a pleasure to see you today. I want to create trusting relationships with patients and provide genuine, compassionate, and quality care. I truly value your feedback! please be on the lookout for a survey regarding your visit with me today. I appreciate your input about our visit and your time in completing this!    Zaheer Wageman L. Abou Sterkel, MSN, APRN, AGNP-C Adult-Gerontology Nurse Practitioner [facility] at [facility]

## 2024-01-28 NOTE — Progress Notes (Signed)
 Referring Provider: Shona Norleen PEDLAR, MD Primary Care Physician:  Shona Norleen PEDLAR, MD Primary GI Physician: Dr. Cinderella   Chief Complaint  Patient presents with   Follow-up    Pt arrives for follow up. Pt had EGD on 6/25(throat is still sore). Pt states she has cut out dairy. Pt states she is doing well other than that. Sharp pain in chest every so often, has seen cardiology   HPI:   Carolyn Gaines is a 58 y.o. female with past medical history of CAD status post PCI and CABG, hypertension, fibromyalgia ,anxiety depression, DVT not on anticoagulation anymore    Patient presenting today for:  Follow up of constipation, dysphagia and EOE  Last seen April, at that time reported swollen lymph nodes in neck she was told were secondary to her recent EGD. Having issues with dysphagia, completed budesonide  slurry, taking PPI BID. Having to regurgitate food back up 3-4 times per week. Having constipation, with harder stools though having a BM daily   Recommended repeat EGD June, Continue PPI BID, high fiber diet, trial of elimination diet to include milk protein x4-6 weeks, consider dupixent if EOE persist on upcoming EGD  Present: States some sore throat since EGD. Feels swallowing is much improved, still avoids meats as these usually dont go down very well.  She is avoiding milk, she sometimes still consumes things with dairy in it such as mayonnaise. She is still taking PPI BID  GERD is well controlled. She notes some occasional sharp Left chest pain. She wonders if this is related to gas. She has seen cardiology for this and was given nitroglycerin  and supposed to have a stress test coming up.   Constipation somewhat improved. She is doing miralax  PRN which works well for her. Trying to do more fruit. No rectal bleeding or melena.    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 Dilated. - Normal stomach. - Normal duodenal bulb and second portion of the  duodenum.  - Biopsies were taken with a cold forceps for  evaluation of eosinophilic esophagitis. -Examination appears much improved , but if                            histologically if patient has persistent                            esonophilia and patient has dysphagia would                            consider adding Dupilimumab  A. DISTAL ESOPHAGEAL BIOPSY:  - Benign squamous mucosa with mild chronic inflammation  - Negative for increased intraepithelial eosinophils  - Negative for dysplasia or malignancy   B. PROXIMAL ESOPHAGEAL BIOPSY:  - Benign squamous mucosa with mild chronic inflammation  - Negative for increased intraepithelial eosinophils  - Negative for dysplasia or malignancy    Last Colonoscopy:2018 The entire examined colon is normal on direct and retroflexion views. - No specimens collected. Suggested repeat 10 years  Filed Weights   01/28/24 1038  Weight: 183 lb 14.4 oz (83.4 kg)     Past Medical History:  Diagnosis Date   Anemia    low iron   Anxiety    Arthritis    Bleeding ulcer    age 48. spent one week in hospital with bleeding ulcers.  Coronary artery disease    a. s/p prior stenting to RCA b. CABG in 09/2016 with LIMA-LAD, SVG-OM and SVG-RCA c. low-risk NST in 01/2019   Depression    Dyspnea    Fibromyalgia    Gallstones    GERD (gastroesophageal reflux disease)    Headache    migraines   MI (myocardial infarction) (HCC) 01/2016   Peptic ulcer    Seizures (HCC)    as a baby    Past Surgical History:  Procedure Laterality Date   ABDOMINAL HYSTERECTOMY     BIOPSY  04/18/2023   Procedure: BIOPSY;  Surgeon: Cinderella Deatrice FALCON, MD;  [facility]: [facility];  Service: Endoscopy;;   BIOPSY  07/16/2023   Procedure: BIOPSY;  Surgeon: Cinderella Deatrice FALCON, MD;  [facility];  Service: Endoscopy;;   BREAST BIOPSY Left 11/05/2023   US  LT BREAST BX W LOC DEV 1ST LESION IMG BX SPEC US  GUIDE 11/05/2023 Lennon Nest, MD [facility]-ULTRAS[facility]    CARDIAC CATHETERIZATION N/A 02/19/2016   Procedure: Left Heart Cath and Coronary Angiography;  Surgeon: Gordy Bergamo, MD;  Location: [facility] INVASIVE CV LAB;  Service: Cardiovascular;  Laterality: N/A;   CARDIAC CATHETERIZATION N/A 02/19/2016   Procedure: Coronary Stent Intervention;  Surgeon: Gordy Bergamo, MD;  Location: [facility] INVASIVE CV LAB;  Service: Cardiovascular;  Laterality: N/A;   CESAREAN SECTION     x 2   CHOLECYSTECTOMY N/A 01/25/2017   Procedure: LAPAROSCOPIC CHOLECYSTECTOMY;  Surgeon: Mavis Anes, MD;  Location: [facility];  Service: General;  Laterality: N/A;   COLONOSCOPY     CORONARY ARTERY BYPASS GRAFT N/A 10/26/2016   Procedure: CORONARY ARTERY BYPASS GRAFTING (CABG), ON PUMP, TIMES THREE, USING LEFT INTERNAL MAMMARY ARTERY AND ENDOSCOPICALLY HARVESTED RIGHT GREATER SAPHENOUS VEIN;  Surgeon: Dorise MARLA Fellers, MD;  Location: [facility] OR;  Service: Open Heart Surgery;  Laterality: N/A;  LIMA-LAD SVG-RCA SVG-OM   ESOPHAGOGASTRODUODENOSCOPY     ESOPHAGOGASTRODUODENOSCOPY N/A 01/22/2024   Procedure: EGD (ESOPHAGOGASTRODUODENOSCOPY);  Surgeon: Cinderella Deatrice FALCON, MD;  [facility];  Service: Endoscopy;  Laterality: N/A;  1:30pm, asa 1-2   ESOPHAGOGASTRODUODENOSCOPY (EGD) WITH PROPOFOL  N/A 04/18/2023   Procedure: ESOPHAGOGASTRODUODENOSCOPY (EGD) WITH PROPOFOL ;  Surgeon: Cinderella Deatrice FALCON, MD;  [facility];  Service: Endoscopy;  Laterality: N/A;  2:15pm;asa 3, LM to see if pt can move up   ESOPHAGOGASTRODUODENOSCOPY (EGD) WITH PROPOFOL  N/A 07/16/2023   Procedure: ESOPHAGOGASTRODUODENOSCOPY (EGD) WITH PROPOFOL ;  Surgeon: Cinderella Deatrice FALCON, MD;  [facility];  Service: Endoscopy;  Laterality: N/A;  8:15AM;ASA 3   LEFT HEART CATH AND CORONARY ANGIOGRAPHY N/A 10/22/2016   Procedure: Left Heart Cath and Coronary Angiography;  Surgeon: Debby DELENA Sor, MD;  Location: [facility] INVASIVE CV LAB;  Service: Cardiovascular;  Laterality: N/A;   SAVORY DILATION  07/16/2023   Procedure: SAVORY  DILATION;  Surgeon: Cinderella Deatrice FALCON, MD;  [facility];  Service: Endoscopy;;   STERNAL WIRES REMOVAL N/A 07/11/2017   Procedure: STERNAL WIRES REMOVAL;  Surgeon: Fellers Dorise MARLA, MD;  Location: [facility] OR;  Service: Thoracic;  Laterality: N/A;   TEE WITHOUT CARDIOVERSION N/A 10/26/2016   Procedure: TRANSESOPHAGEAL ECHOCARDIOGRAM (TEE);  Surgeon: Dorise MARLA Fellers, MD;  Location: [facility] OR;  Service: Open Heart Surgery;  Laterality: N/A;   WRIST FRACTURE SURGERY Left    3 times    Current Outpatient Medications  Medication Sig Dispense Refill   acetaminophen  (TYLENOL ) 500 MG tablet Take 1,000 mg by mouth every 6 (six) hours as needed  for mild pain.     aspirin  EC 81 MG tablet Take 1 tablet (81 mg total) by mouth daily. Swallow whole. 90 tablet 3   carvedilol  (COREG ) 3.125 MG tablet Take 1 tablet (3.125 mg total) by mouth 2 (two) times daily. 180 tablet 3   DULoxetine  (CYMBALTA ) 60 MG capsule Take 60 mg by mouth daily.     ezetimibe  (ZETIA ) 10 MG tablet TAKE 1 TABLET(10 MG) BY MOUTH DAILY 90 tablet 3   isosorbide  mononitrate (IMDUR ) 30 MG 24 hr tablet Take 0.5 tablets (15 mg total) by mouth daily. 45 tablet 1   lidocaine  (LIDODERM ) 5 % 1 patch daily. As needed     nitroGLYCERIN  (NITROSTAT ) 0.4 MG SL tablet DISSOLVE 1 TABLET UNDER THE TONGUE EVERY 5 MINUTES FOR 3 DOSES AS NEEDED FOR CHEST PAIN 25 tablet 3   pantoprazole  (PROTONIX ) 40 MG tablet Take 1 tablet (40 mg total) by mouth 2 (two) times daily. 60 tablet 5   pregabalin (LYRICA) 75 MG capsule Take 75 mg by mouth 2 (two) times daily.     rosuvastatin  (CRESTOR ) 40 MG tablet Take 1 tablet (40 mg total) by mouth every evening. 90 tablet 3   traMADol  (ULTRAM ) 50 MG tablet Take 50 mg by mouth every 6 (six) hours as needed.     No current facility-administered medications for this visit.    Allergies as of 01/28/2024 - Review Complete 01/28/2024  Allergen Reaction Noted   Bee venom Other (See Comments) 02/19/2016   Elastic bandages & [zinc]  Hives 10/24/2018   Mango flavoring agent (non-screening) Hives 11/03/2018   Neosporin [bacitracin-polymyxin b]  04/02/2023   Adhesive [tape] Itching and Swelling 02/19/2016   Triple antibiotic [bacitracin-neomycin-polymyxin] Rash 02/19/2016    Social History   Socioeconomic History   Marital status: Widowed    Spouse name: Not on file   Number of children: 2   Years of education: Not on file   Highest education level: Not on file  Occupational History   Occupation: Education officer, community  Tobacco Use   Smoking status: Former    Current packs/day: 0.00    Types: Cigarettes    Quit date: 03/21/2011    Years since quitting: 12.8   Smokeless tobacco: Never  Vaping Use   Vaping status: Never Used  Substance and Sexual Activity   Alcohol use: Yes    Comment: rare   Drug use: No   Sexual activity: Yes    Birth control/protection: Surgical    Comment: hyst  Other Topics Concern   Not on file  Social History Narrative   Not on file   Social Drivers of Health   Financial Resource Strain: Not on file  Food Insecurity: Not on file  Transportation Needs: Not on file  Physical Activity: Not on file  Stress: Not on file  Social Connections: Not on file    Review of systems General: negative for malaise, night sweats, fever, chills, weight loss Neck: Negative for lumps, goiter, pain and significant neck swelling Resp: Negative for cough, wheezing, dyspnea at rest CV: Negative for chest pain, leg swelling, palpitations, orthopnea GI: denies melena, hematochezia, nausea, vomiting, diarrhea, constipation, odyonophagia, early satiety or unintentional weight loss. +dysphagia on occasion MSK: Negative for joint pain or swelling, back pain, and muscle pain. Derm: Negative for itching or rash Psych: Denies depression, anxiety, memory loss, confusion. No homicidal or suicidal ideation.  Heme: Negative for prolonged bleeding, bruising easily, and swollen nodes. Endocrine: Negative for  cold or heat intolerance,  polyuria, polydipsia and goiter. Neuro: negative for tremor, gait imbalance, syncope and seizures. The remainder of the review of systems is noncontributory.  Physical Exam: BP (!) 93/59   Pulse 73   Temp 98.3 F (36.8 C)   Ht 5' 3 (1.6 m)   Wt 183 lb 14.4 oz (83.4 kg)   BMI 32.58 kg/m  General:   Alert and oriented. No distress noted. Pleasant and cooperative.  Head:  Normocephalic and atraumatic. Eyes:  Conjuctiva clear without scleral icterus. Mouth:  Oral mucosa pink and moist. Good dentition. No lesions. Heart: Normal rate and rhythm, s1 and s2 heart sounds present.  Lungs: Clear lung sounds in all lobes. Respirations equal and unlabored. Abdomen:  +BS, soft, non-tender and non-distended. No rebound or guarding. No HSM or masses noted. Derm: No palmar erythema or jaundice Msk:  Symmetrical without gross deformities. Normal posture. Extremities:  Without edema. Neurologic:  Alert and  oriented x4 Psych:  Alert and cooperative. Normal mood and affect.  Invalid input(s): 6 MONTHS   ASSESSMENT: Carolyn Gaines is a 58 y.o. female presenting today for follow up of EOE, dysphagia and constipation  EOE/dysphagia: recent EGD without presence of EOE. She remains on PPI BID and is trying to avoid milk protein for the most part though seems to be consuming this some still in her diet. Dysphagia is much improved though still some issues with meats. For now will continue with PPI BID, consider decreasing to once daily dosing at Follow up pending clinical course. She should continue to try and avoid milk protein as this seems to have helped with her EOE as well.   Constipation; mostly well controlled with diet, eating more fruit recently. She takes miralax  on occasion. She inquired about a probiotic which we discussed can sometimes provide some benefit for patients. I recommended she try one with 3-4 strains of bacteria. Should continue with high water intake,  plenty of fruits, veggies and whole grains in her diet.    PLAN:  -continue PPI BID, consider stepping down to daily dosing if doing well at follow up -continue to avoid milk protein in diet  -can try otc probiotic -continue high water, fruit intake, high fiber diet -miralax  PRN   All questions were answered, patient verbalized understanding and is in agreement with plan as outlined above.   Follow Up: 4 months   Laquanta Hummel L. Bernetta Sutley, MSN, APRN, AGNP-C Adult-Gerontology Nurse Practitioner [facility]

## 2024-02-20 ENCOUNTER — Other Ambulatory Visit (INDEPENDENT_AMBULATORY_CARE_PROVIDER_SITE_OTHER): Payer: Self-pay | Admitting: Gastroenterology

## 2024-03-03 ENCOUNTER — Encounter: Payer: Self-pay | Admitting: Nurse Practitioner

## 2024-03-03 ENCOUNTER — Ambulatory Visit: Attending: Nurse Practitioner | Admitting: Nurse Practitioner

## 2024-03-03 VITALS — BP 112/70 | HR 62 | Ht 63.0 in | Wt 184.0 lb

## 2024-03-03 DIAGNOSIS — Z86718 Personal history of other venous thrombosis and embolism: Secondary | ICD-10-CM | POA: Diagnosis present

## 2024-03-03 DIAGNOSIS — I25119 Atherosclerotic heart disease of native coronary artery with unspecified angina pectoris: Secondary | ICD-10-CM | POA: Diagnosis present

## 2024-03-03 DIAGNOSIS — E785 Hyperlipidemia, unspecified: Secondary | ICD-10-CM

## 2024-03-03 DIAGNOSIS — I6529 Occlusion and stenosis of unspecified carotid artery: Secondary | ICD-10-CM | POA: Diagnosis present

## 2024-03-03 DIAGNOSIS — R0609 Other forms of dyspnea: Secondary | ICD-10-CM

## 2024-03-03 MED ORDER — ISOSORBIDE MONONITRATE ER 30 MG PO TB24: 30.0000 mg | ORAL_TABLET | Freq: Every day | ORAL | 1 refills | Status: AC

## 2024-03-03 NOTE — Progress Notes (Unsigned)
 Cardiology Office Note:  .   Date:  03/03/2024  ID:  Carolyn Gaines, DOB 10-Aug-1965, MRN [id number] PCP: Shona Norleen PEDLAR, MD  [facility] Providers Cardiologist:  Alvan Carrier, MD    History of Present Illness: .   Carolyn Gaines is a 58 y.o. female with a PMH of CAD, s/p CABG in 2018, hx of chest pain, DOE, HLD, hx of DVT, and carotid artery stenosis, who presents today for 6-8 week follow-up.  Previous cardiovascular history of STEMI in 2017, received drug-eluting stent to the RCA.  She had residual LAD disease, 60 to 70% at the time, was medically managed.  Underwent cardiac catheterization in 2018-see full report below.  Underwent CABG in 2018 (LIMA-LAD, SVG-OM, SVG-RCA).  Was noted that she had a history of chronic chest wall pain since CABG. Sternal wires were removed in 2018, did improve some symptoms.  Last seen by Dr. Carrier Alvan on May 03, 2023.  She noted recent DOE of unclear etiology.  She had gained 23 pounds since previous year due to being sedentary after knee surgery.  Echo was arranged and was overall unremarkable-see below.  10/10/2023 - Today she presents for 34-month follow-up appointment.  She admits to sharp chest pains along her mid chest, denies any exertional triggers, however typically noticed when picking up her dogs. Has this has been chronic since her surgery, has been worse recently. Says it feels like a pulled muscle. Did take NTG once that helped. Rates this as 8/10 when it occurs. Left arm also is occasionally sore with this. DOE remains stable. Denies any palpitations, syncope, presyncope, dizziness, orthopnea, PND, swelling or significant weight changes, acute bleeding, or claudication.  01/17/2024 - Here for follow-up. Denies any recurrent chest pain. Denies any chest pain, palpitations, syncope, presyncope, dizziness, orthopnea, PND, swelling or significant weight changes, acute bleeding, or claudication. She is pending future EGD. Does admit to  fatigue at end of day after watching her grandson, does admit to some shortness of breath with this and soreness along mid chest intermittently, felt to be related to picking up her grandson.   03/03/2024 - Here for follow-up. Admits to episode of standing in her kitchen when she felt a short wave though chest that was sharp and hard, episode lasted for about 30 minutes to 1 hour, has been doing well since then. Underwent EGD in July, tolerated procedure well. Does admit to stress and DOE. DOE has been going on for around 2 months or more per her report. Denies any palpitations, syncope, presyncope, dizziness, orthopnea, PND, swelling or significant weight changes, acute bleeding, or claudication.  ROS: Negative. See HPI.   Studies Reviewed: SABRA    EKG: EKG is not ordered today.       Echo 05/2023:   1. Left ventricular ejection fraction, by estimation, is 55 to 60%. The  left ventricle has normal function. The left ventricle has no regional  wall motion abnormalities. Left ventricular diastolic parameters were  normal.   2. Right ventricular systolic function is normal. The right ventricular  size is normal. There is normal pulmonary artery systolic pressure.   3. The mitral valve is normal in structure. No evidence of mitral valve  regurgitation. No evidence of mitral stenosis.   4. The aortic valve is tricuspid. There is mild calcification of the  aortic valve. There is mild thickening of the aortic valve. Aortic valve  regurgitation is not visualized. Aortic valve sclerosis is present, with  no evidence of  aortic valve stenosis.   5. The inferior vena cava is normal in size with greater than 50%  respiratory variability, suggesting right atrial pressure of 3 mmHg.  Carotid duplex 04/2023:  Impression: No hemodynamically significant stenosis by duplex criteria in the extracranial cerebrovascular circulation.  Lexiscan  10/2020:  Lexiscan  stress is electrically negative Myovue scan shows  normal perfusion No significant ischemia or scar LVEF calculated at 60% Overall low risk study.  LHC 09/2016:  Mid LAD lesion, 50 %stenosed. Ost LM lesion, 85 %stenosed. Mid RCA-1 lesion, 50 %stenosed. Mid RCA-2 lesion, 0 %stenosed. A drug eluting . Prox RCA lesion, 20 %stenosed.   Severe native CAD with 80-90% ostial stenosis of the left main coronary artery which did not improve following IC nitroglycerin  administration; 50% mid LAD stenoses; normal left circumflex vessel; and patent mid RCA stent with 45-50% smooth eccentric stenosis proximal to the stented segment and 20% proximal irregularity.   Mild  residual LV dysfunction with an ejection fraction of 45-50% with mild mid inferior hypocontractility.   RECOMMENDATION: The patient has been on full dose Brilinta  since her DES stent implantation in July 2018.  Surgical consultation was obtained with Dr. Army.  Brilinta  will be discontinued.  The patient will be stabilized on IV NTG with plans to reinstitute heparin . Plan for CABG surgery later this week following several days of Brilinta  washout.   If the patient becomes unstable, consider emergent left main stenting.  Physical Exam:   VS:  BP 112/70   Pulse 62   Ht 5' 3 (1.6 m)   Wt 184 lb (83.5 kg)   SpO2 98%   BMI 32.59 kg/m    Wt Readings from Last 3 Encounters:  03/03/24 184 lb (83.5 kg)  01/28/24 183 lb 14.4 oz (83.4 kg)  01/17/24 186 lb 3.2 oz (84.5 kg)    GEN: Well nourished, well developed in no acute distress NECK: No JVD; No carotid bruits CARDIAC: S1/S2, RRR, no murmurs, rubs, gallops RESPIRATORY:  Clear to auscultation without rales, wheezing or rhonchi  ABDOMEN: Soft, non-tender, non-distended EXTREMITIES:  No edema; No deformity   ASSESSMENT AND PLAN: .    CAD, s/p CABG Recent atypical episode with no recurrence since then.  History of chronic and longstanding chest pain since surgery in 2018.  Last NST was normal in 2022.  Will increase Imdur  to 30 mg  to be taken at nighttime. No other medication changes at this time.  Care and ED precautions discussed. Heart healthy diet and regular cardiovascular exercise encouraged.   3.  Carotid artery stenosis, hyperlipidemia Most recent carotid Doppler in October 2024 was negative for any significant carotid artery stenosis.  Most recent LDL 49.  No medication changes at this time.  Denies any symptoms. Heart healthy diet and regular cardiovascular exercise encouraged.   3.  DOE Continues to note DOE, longstanding hx.  Did review echocardiogram with her from November 2024 was normal.  Etiology unclear.  Could most likely be due to deconditioning after knee surgery.  Discussed treatment options and will continue to monitor at this time.   4.  History of DVT Most recent Doppler last fall showed resolution of DVT.  No medication changes at this time.  Continue follow-up with PCP.  Dispo: Care and ED precautions discussed.  Follow-up with MD/APP in 6 weeks or sooner if anything changes.  Signed, Almarie Crate, NP

## 2024-03-03 NOTE — Patient Instructions (Addendum)
 Medication Instructions:  Your physician has recommended you make the following change in your medication:  Please increase IMDUR  to 30 Mg nightly   Labwork: None   Testing/Procedures: None   Follow-Up: Your physician recommends that you schedule a follow-up appointment in: 6 weeks   Any Other Special Instructions Will Be Listed Below (If Applicable).  If you need a refill on your cardiac medications before your next appointment, please call your pharmacy.

## 2024-04-06 ENCOUNTER — Encounter: Payer: Self-pay | Admitting: Internal Medicine

## 2024-04-08 ENCOUNTER — Encounter: Payer: Self-pay | Admitting: Cardiology

## 2024-04-08 ENCOUNTER — Ambulatory Visit: Attending: Cardiology | Admitting: Cardiology

## 2024-04-08 VITALS — BP 116/70 | HR 76 | Ht 63.0 in | Wt 186.0 lb

## 2024-04-08 DIAGNOSIS — I6523 Occlusion and stenosis of bilateral carotid arteries: Secondary | ICD-10-CM | POA: Insufficient documentation

## 2024-04-08 DIAGNOSIS — I25119 Atherosclerotic heart disease of native coronary artery with unspecified angina pectoris: Secondary | ICD-10-CM | POA: Insufficient documentation

## 2024-04-08 DIAGNOSIS — E782 Mixed hyperlipidemia: Secondary | ICD-10-CM | POA: Diagnosis present

## 2024-04-08 NOTE — Patient Instructions (Addendum)
 Medication Instructions:  Your physician recommends that you continue on your current medications as directed. Please refer to the Current Medication list given to you today.   Labwork: None  Testing/Procedures: None  Follow-Up: Your physician recommends that you schedule a follow-up appointment in: 4 months  Any Other Special Instructions Will Be Listed Below (If Applicable). Thank you for choosing [facility]!     If you need a refill on your cardiac medications before your next appointment, please call your pharmacy.

## 2024-04-08 NOTE — Progress Notes (Signed)
 Clinical Summary Ms. Magnussen is a 58 y.o.female seen today for follow up of the following medical problems.    1. 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 Residual LAD disease 60-70% managed medically. - From Dr Godfrey notes nuclear stress 03/2016 (after stent) with mild septal ischemia, intermediate risk. - 09/2016 cath with 90% ostial LM. S/p cabg 10/16/16 wth LIMA-LAD, SVG-OM, SVG-RCA - 09/2016 echo LVEF 55-60%, no WMAs  - Has not been on ACE-I due to soft bp's     history of chronic chest wall pain since CABG, Did not tolerate gabapentin . Did have sternal wires removed 07/11/2017 with some improvement   01/2019 nuclear stress no ischemia   normal Lexiscan  Myoview  11/10/2020    - DOE started about 3 months ago. For example walking up 1 flight of stairs, mild chest discomfort. Pins/needles like feeling which she uses lidocaine  patch - some recent LE edema. Mild orthopnea - 02/2022 LVEF 60-65%, normal diastolic function, normal RV - knee surgery about 1 year ago, limited activities.  - weight is up 23 lbs since last year.       05/2023 echo: LVEF 55-60%, normal diastolic, normal RV function  - isolated episode of chest pain, imdur  was increased to 30mg  daily - some left chest soreness, can occur at rest or with activity. Not positional. Lasts just a second or 2.  - working on weight loss, pcp just started [employer].       2. Hyperlipidemia - had been off statin for some time, now just restarted a few weeks ago - 01/2023 TC171 TG 153 HDL 46 LDL 98 - she is on crestor  40, zetia  10. not interested in pcsk9i  - 03/2024 TC HDL 33 LDL 156 TG 205      3. History of DVT - diagnosed 12/2020 - managed by pcp       4. Carotid US  2018 RICA 1-39% - 04/2023 minimal plaque   SH: husband recently passed away, had been ill for long time. She has started hiking, doing things on her bucket list  Works at pharmacy. Past Medical History:  Diagnosis Date   Anemia     low iron   Anxiety    Arthritis    Bleeding ulcer    age 23. spent one week in hospital with bleeding ulcers.   Coronary artery disease    a. s/p prior stenting to RCA b. CABG in 09/2016 with LIMA-LAD, SVG-OM and SVG-RCA c. low-risk NST in 01/2019   Depression    Dyspnea    Fibromyalgia    Gallstones    GERD (gastroesophageal reflux disease)    Headache    migraines   MI (myocardial infarction) (HCC) 01/2016   Peptic ulcer    Seizures (HCC)    as a baby     Allergies  Allergen Reactions   Bee Venom Other (See Comments)    Pus sites at injection   Elastic Bandages & [Zinc] Hives   Mango Flavoring Agent (Non-Screening) Hives    All mango   Neosporin [Bacitracin-Polymyxin B]     Pt states she can use this medication    Adhesive [Tape] Itching and Swelling    Please use paper tape   Triple Antibiotic [Bacitracin-Neomycin-Polymyxin] Rash     Current Outpatient Medications  Medication Sig Dispense Refill   acetaminophen  (TYLENOL ) 500 MG tablet Take 1,000 mg by mouth every 6 (six) hours as needed for mild pain.     aspirin   EC 81 MG tablet Take 1 tablet (81 mg total) by mouth daily. Swallow whole. 90 tablet 3   carvedilol  (COREG ) 3.125 MG tablet Take 1 tablet (3.125 mg total) by mouth 2 (two) times daily. 180 tablet 3   DULoxetine  (CYMBALTA ) 60 MG capsule Take 60 mg by mouth daily.     ezetimibe  (ZETIA ) 10 MG tablet TAKE 1 TABLET(10 MG) BY MOUTH DAILY 90 tablet 3   isosorbide  mononitrate (IMDUR ) 30 MG 24 hr tablet Take 1 tablet (30 mg total) by mouth daily. 90 tablet 1   lidocaine  (LIDODERM ) 5 % 1 patch daily. As needed     nitroGLYCERIN  (NITROSTAT ) 0.4 MG SL tablet DISSOLVE 1 TABLET UNDER THE TONGUE EVERY 5 MINUTES FOR 3 DOSES AS NEEDED FOR CHEST PAIN 25 tablet 3   pantoprazole  (PROTONIX ) 40 MG tablet TAKE 1 TABLET(40 MG) BY MOUTH TWICE DAILY 60 tablet 5   pregabalin (LYRICA) 75 MG capsule Take 75 mg by mouth 2 (two) times daily.     rosuvastatin  (CRESTOR ) 40 MG tablet Take 1  tablet (40 mg total) by mouth every evening. 90 tablet 3   traMADol  (ULTRAM ) 50 MG tablet Take 50 mg by mouth every 6 (six) hours as needed.     No current facility-administered medications for this visit.     Past Surgical History:  Procedure Laterality Date   ABDOMINAL HYSTERECTOMY     BIOPSY  04/18/2023   Procedure: BIOPSY;  Surgeon: Cinderella Deatrice FALCON, MD;  Location: [facility];  Service: Endoscopy;;   BIOPSY  07/16/2023   Procedure: BIOPSY;  Surgeon: Cinderella Deatrice FALCON, MD;  [facility]: [facility];  Service: Endoscopy;;   BREAST BIOPSY Left 11/05/2023   US  LT BREAST BX W LOC DEV 1ST LESION IMG BX SPEC US  GUIDE 11/05/2023 Lennon Nest, MD [facility]   CARDIAC CATHETERIZATION N/A 02/19/2016   Procedure: Left Heart Cath and Coronary Angiography;  Surgeon: Gordy Bergamo, MD;  Location: [facility] INVASIVE CV LAB;  Service: Cardiovascular;  Laterality: N/A;   CARDIAC CATHETERIZATION N/A 02/19/2016   Procedure: Coronary Stent Intervention;  Surgeon: Gordy Bergamo, MD;  Location: [facility] INVASIVE CV LAB;  Service: Cardiovascular;  Laterality: N/A;   CESAREAN SECTION     x 2   CHOLECYSTECTOMY N/A 01/25/2017   Procedure: LAPAROSCOPIC CHOLECYSTECTOMY;  Surgeon: Mavis Anes, MD;  Location: [facility];  Service: General;  Laterality: N/A;   COLONOSCOPY     CORONARY ARTERY BYPASS GRAFT N/A 10/26/2016   Procedure: CORONARY ARTERY BYPASS GRAFTING (CABG), ON PUMP, TIMES THREE, USING LEFT INTERNAL MAMMARY ARTERY AND ENDOSCOPICALLY HARVESTED RIGHT GREATER SAPHENOUS VEIN;  Surgeon: Dorise MARLA Fellers, MD;  Location: [facility];  Service: Open Heart Surgery;  Laterality: N/A;  LIMA-LAD SVG-RCA SVG-OM   ESOPHAGOGASTRODUODENOSCOPY     ESOPHAGOGASTRODUODENOSCOPY N/A 01/22/2024   Procedure: EGD (ESOPHAGOGASTRODUODENOSCOPY);  Surgeon: Cinderella Deatrice FALCON, MD;  [facility];  Service: Endoscopy;  Laterality: N/A;  1:30pm, asa 1-2   ESOPHAGOGASTRODUODENOSCOPY (EGD) WITH PROPOFOL  N/A 04/18/2023   Procedure:  ESOPHAGOGASTRODUODENOSCOPY (EGD) WITH PROPOFOL ;  Surgeon: Cinderella Deatrice FALCON, MD;  [facility];  Service: Endoscopy;  Laterality: N/A;  2:15pm;asa 3, LM to see if pt can move up   ESOPHAGOGASTRODUODENOSCOPY (EGD) WITH PROPOFOL  N/A 07/16/2023   Procedure: ESOPHAGOGASTRODUODENOSCOPY (EGD) WITH PROPOFOL ;  Surgeon: Cinderella Deatrice FALCON, MD;  [facility];  Service: Endoscopy;  Laterality: N/A;  8:15AM;ASA 3   LEFT HEART CATH AND CORONARY ANGIOGRAPHY N/A 10/22/2016   Procedure: Left Heart Cath and Coronary Angiography;  Surgeon: Debby DELENA Sor, MD;  Location[facility] INVAS[facility] CV LAB;  Service: Cardiovascular;  Laterality: N/A;   SAVORY DILATION  07/16/2023   Procedure: SAVORY DILATION;  Surgeon: Cinderella Deatrice FALCON, MD;  [facility];  Service: Endoscopy;;   STERNAL WIRES REMOVAL N/A 07/11/2017   Procedure: STERNAL WIRES REMOVAL;  Surgeon: Lucas Dorise POUR, MD;  Location: [facility];  Service: Thoracic;  Laterality: N/A;   TEE WITHOUT CARDIOVERSION N/A 10/26/2016   Procedure: TRANSESOPHAGEAL ECHOCARDIOGRAM (TEE);  Surgeon: Dorise POUR Lucas, MD;  Location: [facility] OR;  Service: Open Heart Surgery;  Laterality: N/A;   WRIST FRACTURE SURGERY Left    3 times     Allergies  Allergen Reactions   Bee Venom Other (See Comments)    Pus sites at injection   Elastic Bandages & [Zinc] Hives   Mango Flavoring Agent (Non-Screening) Hives    All mango   Neosporin [Bacitracin-Polymyxin B]     Pt states she can use this medication    Adhesive [Tape] Itching and Swelling    Please use paper tape   Triple Antibiotic [Bacitracin-Neomycin-Polymyxin] Rash      Family History  Problem Relation Age of Onset   COPD Mother    Arthritis Mother    Heart disease Mother    Stroke Mother    Psoriasis Father    Heart disease Father    Breast cancer Daughter    Diabetes Maternal Aunt    Diabetes Maternal Aunt    Diabetes Maternal Grandmother      Social History Ms. Gaut reports that she quit  smoking about 13 years ago. Her smoking use included cigarettes. She has never used smokeless tobacco. Ms. Lown reports current alcohol use.    Physical Examination Today's Vitals   04/08/24 0821  BP: 116/70  Pulse: 76  SpO2: 97%  Weight: 186 lb (84.4 kg)  Height: 5' 3 (1.6 m)   Body mass index is 32.95 kg/m.  Gen: resting comfortably, no acute distress HEENT: no scleral icterus, pupils equal round and reactive, no palptable cervical adenopathy,  CV: RRR, no m/rg, no jvd Resp: Clear to auscultation bilaterally GI: abdomen is soft, non-tender, non-distended, normal bowel sounds, no hepatosplenomegaly MSK: extremities are warm, no edema.  Skin: warm, no rash Neuro:  no focal deficits Psych: appropriate affect   Diagnostic Studies  12/2018 echo IMPRESSIONS     1. The left ventricle has normal systolic function with an ejection  fraction of 60-65%. The cavity size was normal. Left ventricular diastolic  parameters were normal.   2. The right ventricle has normal systolic function. The cavity was  normal. There is no increase in right ventricular wall thickness.   3. Mild thickening of the mitral valve leaflet.   4. The tricuspid valve is grossly normal.   5. The aortic valve is tricuspid. Moderate thickening of the aortic  valve. Sclerosis without any evidence of stenosis of the aortic valve.      01/2019 nuclear stress There was no ST segment deviation noted during stress. The study is normal. There are no perfusion defects consistent with prior infarct or current ischemia. This is a low risk study. The left ventricular ejection fraction is hyperdynamic (>65%).   05/2023 echo 1. Left ventricular ejection fraction, by estimation, is 55 to 60%. The  left ventricle has normal function. The left ventricle has no regional  wall motion abnormalities. Left ventricular diastolic parameters were  normal.   2. Right ventricular systolic function is normal. The  right  ventricular  size is normal. There is normal pulmonary artery systolic pressure.   3. The mitral valve is normal in structure. No evidence of mitral valve  regurgitation. No evidence of mitral stenosis.   4. The aortic valve is tricuspid. There is mild calcification of the  aortic valve. There is mild thickening of the aortic valve. Aortic valve  regurgitation is not visualized. Aortic valve sclerosis is present, with  no evidence of aortic valve stenosis.   5. The inferior vena cava is normal in size with greater than 50%  respiratory variability, suggesting right atrial pressure of 3 mmHg.     Assessment and Plan  1. CAD/Chest pain - history of chronic chest wall pain since CABG, Did not tolerate gabapentin . Did have sternal wires removed 07/11/2017 with some improvement -recent atypical chest pain resolved, continue to monitor - chronic DOE, started around time of knee surgery and over 20 lbs weight gain. Echo was benign. PCP plans for PFTs, follow results. Also working on weight loss, starting wegovy.    2. Carotid stenosis - minimal plaque, continue current meds   3. Hyperlipidemia - LDL above goal of <55, she is on max oral regimen with crestor  and zetia  and currently is not intersted in pcsk9i - remains note interested in pcsk9i   F/u 4 months    Dorn PHEBE Ross, M.D

## 2024-04-17 ENCOUNTER — Ambulatory Visit: Admitting: Nurse Practitioner

## 2024-05-13 ENCOUNTER — Ambulatory Visit (HOSPITAL_COMMUNITY)
Admission: RE | Admit: 2024-05-13 | Discharge: 2024-05-13 | Disposition: A | Discharge status: Home / Self Care | Source: Ambulatory Visit | Attending: Internal Medicine | Admitting: Internal Medicine

## 2024-05-13 DIAGNOSIS — J439 Emphysema, unspecified: Secondary | ICD-10-CM | POA: Diagnosis present

## 2024-05-13 LAB — PULMONARY FUNCTION TEST
DL/VA % pred: 94 %
DL/VA: 4.01 ml/min/mmHg/L
DLCO unc % pred: 75 %
DLCO unc: 14.91 ml/min/mmHg
FEF 25-75 Post: 1.51 L/s
FEF 25-75 Pre: 1 L/s
FEF2575-%Change-Post: 49 %
FEF2575-%Pred-Post: 63 %
FEF2575-%Pred-Pre: 42 %
FEV1-%Change-Post: 9 %
FEV1-%Pred-Post: 62 %
FEV1-%Pred-Pre: 57 %
FEV1-Post: 1.58 L
FEV1-Pre: 1.44 L
FEV1FVC-%Change-Post: 8 %
FEV1FVC-%Pred-Pre: 91 %
FEV6-%Change-Post: 1 %
FEV6-%Pred-Post: 64 %
FEV6-%Pred-Pre: 63 %
FEV6-Post: 2.01 L
FEV6-Pre: 1.98 L
FEV6FVC-%Change-Post: 0 %
FEV6FVC-%Pred-Post: 103 %
FEV6FVC-%Pred-Pre: 102 %
FVC-%Change-Post: 1 %
FVC-%Pred-Post: 62 %
FVC-%Pred-Pre: 61 %
FVC-Post: 2.03 L
FVC-Pre: 2 L
Post FEV1/FVC ratio: 78 %
Post FEV6/FVC ratio: 100 %
Pre FEV1/FVC ratio: 72 %
Pre FEV6/FVC Ratio: 99 %
RV % pred: 109 %
RV: 2.08 L
TLC % pred: 83 %
TLC: 4.1 L

## 2024-05-13 MED ORDER — ALBUTEROL SULFATE (2.5 MG/3ML) 0.083% IN NEBU
2.5000 mg | INHALATION_SOLUTION | Freq: Once | RESPIRATORY_TRACT | Status: AC
  Administered 2024-05-13: 2.5 mg via RESPIRATORY_TRACT

## 2024-05-24 ENCOUNTER — Other Ambulatory Visit (INDEPENDENT_AMBULATORY_CARE_PROVIDER_SITE_OTHER): Payer: Self-pay | Admitting: Gastroenterology

## 2024-06-01 ENCOUNTER — Encounter (INDEPENDENT_AMBULATORY_CARE_PROVIDER_SITE_OTHER): Payer: Self-pay | Admitting: Gastroenterology

## 2024-06-01 ENCOUNTER — Ambulatory Visit (INDEPENDENT_AMBULATORY_CARE_PROVIDER_SITE_OTHER): Admitting: Gastroenterology

## 2024-06-01 ENCOUNTER — Telehealth (INDEPENDENT_AMBULATORY_CARE_PROVIDER_SITE_OTHER): Payer: Self-pay

## 2024-06-01 VITALS — BP 103/61 | HR 90 | Temp 97.8°F | Ht 63.0 in | Wt 186.9 lb

## 2024-06-01 DIAGNOSIS — K625 Hemorrhage of anus and rectum: Secondary | ICD-10-CM

## 2024-06-01 DIAGNOSIS — B3789 Other sites of candidiasis: Secondary | ICD-10-CM | POA: Insufficient documentation

## 2024-06-01 DIAGNOSIS — K5904 Chronic idiopathic constipation: Secondary | ICD-10-CM

## 2024-06-01 DIAGNOSIS — K200 Eosinophilic esophagitis: Secondary | ICD-10-CM | POA: Diagnosis not present

## 2024-06-01 DIAGNOSIS — K219 Gastro-esophageal reflux disease without esophagitis: Secondary | ICD-10-CM

## 2024-06-01 MED ORDER — HYDROCORTISONE (PERIANAL) 2.5 % EX CREA: 1.0000 | TOPICAL_CREAM | Freq: Three times a day (TID) | CUTANEOUS | 1 refills | Status: DC

## 2024-06-01 MED ORDER — NYSTATIN 100000 UNIT/GM EX OINT: 1.0000 | TOPICAL_OINTMENT | Freq: Two times a day (BID) | CUTANEOUS | 1 refills | Status: AC

## 2024-06-01 NOTE — Progress Notes (Unsigned)
 Referring Provider: Shona Norleen PEDLAR, MD Primary Care Physician:  Shona Norleen PEDLAR, MD Primary GI Physician: Dr. Cinderella   Chief Complaint  Patient presents with   Follow-up    Patient here today for a follow up on Gerd and esophagitis. She does still have occasions of mid epigastric pain and reflux. Patient is taking pantoprazole  40 mg bid, and says this controls her symptoms.    HPI:   Carolyn Gaines is a 58 y.o. female with past medical history of CAD status post PCI and CABG, hypertension, fibromyalgia ,anxiety depression, DVT not on anticoagulation anymore   Patient presenting today for:  Follow up of dysphagia/EOE/GERD Constipation  Rectal bleeding Perianal candidasis   Last seen July, at that time avoiding meat, milk, sometime still using products with diary. Taking PPI BID, gerd well controlled. Some left sided chest pain, upcoming stress test. Constipation well controlled with miralax  PRN.  Recommended continue PPI BID, continue to avoid milk protein in diet, try otc probiotic, high water intake, high fiber diet, miralax  PRN  CMP and CBC on 04/06/24 grossly unremarkable   Present:   States she has had a few episodes where she has had coffee or dairy and had some chest discomfort/GERD flare, maybe 1-2 times per month. She is not currently having any dysphagia. She notes some swelling or discomfort to her left neck/throat a times. She has used listerine to help cleanse her throat. She tries to avoid coffee and dairy except on certain occasions as this tends to give her some issues. She will sometimes use butter or a small amount of milk in foods but tends to do okay with this.   Constipation has been worse recently. Has had issues with hemorrhoids recently. She notes she has had to wear a pad at times due to some rectal bleeding. She notes a lot of itching and burning in her perineal area. She is using tucks pads to helps. She notes that she has had times where blood drips into the toilet,  she has some burning in rectal area as well. She notes that she often feels she overcleans herself.  bleeding has been newer over the past few months. 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 Dilated. - Normal stomach. - Normal duodenal bulb and second portion of the duodenum.  - Biopsies were taken with a cold forceps for  evaluation of eosinophilic esophagitis. -Examination appears much improved , but if                            histologically if patient has persistent                            esonophilia and patient has dysphagia would                            consider adding Dupilimumab   A. DISTAL ESOPHAGEAL BIOPSY:  - Benign squamous mucosa with mild chronic inflammation  - Negative for increased intraepithelial eosinophils  - Negative for dysplasia or malignancy   B. PROXIMAL ESOPHAGEAL BIOPSY:  - Benign squamous mucosa with mild chronic inflammation  - Negative for increased intraepithelial eosinophils  - Negative for dysplasia or malignancy    Repeat EGD PRN  Last Colonoscopy: 2018 The entire examined colon is normal on direct and retroflexion views. - No specimens collected.  Suggested repeat 10 years  Past Medical History:  Diagnosis Date   Anemia    low iron   Anxiety    Arthritis    Bleeding ulcer    age 69. spent one week in hospital with bleeding ulcers.   Coronary artery disease    a. s/p prior stenting to RCA b. CABG in 09/2016 with LIMA-LAD, SVG-OM and SVG-RCA c. low-risk NST in 01/2019   Depression    Dyspnea    Fibromyalgia    Gallstones    GERD (gastroesophageal reflux disease)    Headache    migraines   MI (myocardial infarction) (HCC) 01/2016   Peptic ulcer    Seizures (HCC)    as a baby    Past Surgical History:  Procedure Laterality Date   ABDOMINAL HYSTERECTOMY     BIOPSY  04/18/2023   Procedure:  BIOPSY;  Surgeon: Cinderella Deatrice FALCON, MD;  Location: [facility];  Service: Endoscopy;;   BIOPSY  07/16/2023   Procedure: BIOPSY;  Surgeon: Cinderella Deatrice FALCON, MD;  [facility]: [facility];  Service: Endoscopy;;   BREAST BIOPSY Left 11/05/2023   US  LT BREAST BX W LOC DEV 1ST LESION IMG BX SPEC US  GUIDE 11/05/2023 Lennon Nest, MD [facility]   CARDIAC CATHETERIZATION N/A 02/19/2016   Procedure: Left Heart Cath and Coronary Angiography;  Surgeon: Gordy Bergamo, MD;  Location: [facility] INVASIVE CV LAB;  Service: Cardiovascular;  Laterality: N/A;   CARDIAC CATHETERIZATION N/A 02/19/2016   Procedure: Coronary Stent Intervention;  Surgeon: Gordy Bergamo, MD;  Location: [facility] INVASIVE CV LAB;  Service: Cardiovascular;  Laterality: N/A;   CESAREAN SECTION     x 2   CHOLECYSTECTOMY N/A 01/25/2017   Procedure: LAPAROSCOPIC CHOLECYSTECTOMY;  Surgeon: Mavis Anes, MD;  Location: [facility];  Service: General;  Laterality: N/A;   COLONOSCOPY     CORONARY ARTERY BYPASS GRAFT N/A 10/26/2016   Procedure: CORONARY ARTERY BYPASS GRAFTING (CABG), ON PUMP, TIMES THREE, USING LEFT INTERNAL MAMMARY ARTERY AND ENDOSCOPICALLY HARVESTED RIGHT GREATER SAPHENOUS VEIN;  Surgeon: Dorise MARLA Fellers, MD;  Location: [facility] OR;  Service: Open Heart Surgery;  Laterality: N/A;  LIMA-LAD SVG-RCA SVG-OM   ESOPHAGOGASTRODUODENOSCOPY     ESOPHAGOGASTRODUODENOSCOPY N/A 01/22/2024   Procedure: EGD (ESOPHAGOGASTRODUODENOSCOPY);  Surgeon: Cinderella Deatrice FALCON, MD;  [facility]: [facility];  Service: Endoscopy;  Laterality: N/A;  1:30pm, asa 1-2   ESOPHAGOGASTRODUODENOSCOPY (EGD) WITH PROPOFOL  N/A 04/18/2023   Procedure: ESOPHAGOGASTRODUODENOSCOPY (EGD) WITH PROPOFOL ;  Surgeon: Cinderella Deatrice FALCON, MD;  [facility]: [facility];  Service: Endoscopy;  Laterality: N/A;  2:15pm;asa 3, LM to see if pt can move up   ESOPHAGOGASTRODUODENOSCOPY (EGD) WITH PROPOFOL  N/A 07/16/2023   Procedure: ESOPHAGOGASTRODUODENOSCOPY (EGD) WITH PROPOFOL ;  Surgeon: Cinderella Deatrice FALCON, MD;   [facility]: [facility];  Service: Endoscopy;  Laterality: N/A;  8:15AM;ASA 3   LEFT HEART CATH AND CORONARY ANGIOGRAPHY N/A 10/22/2016   Procedure: Left Heart Cath and Coronary Angiography;  Surgeon: Debby DELENA Sor, MD;  Location: [facility] INVASIVE CV LAB;  Service: Cardiovascular;  Laterality: N/A;   SAVORY DILATION  07/16/2023   Procedure: SAVORY DILATION;  Surgeon: Cinderella Deatrice FALCON, MD;  Location: [facility];  Service: Endoscopy;;   STERNAL WIRES REMOVAL N/A 07/11/2017   Procedure: STERNAL WIRES REMOVAL;  Surgeon: Fellers Dorise MARLA, MD;  Location: [facility] OR;  Service: Thoracic;  Laterality: N/A;   TEE WITHOUT CARDIOVERSION N/A  10/26/2016   Procedure: TRANSESOPHAGEAL ECHOCARDIOGRAM (TEE);  Surgeon: Dorise MARLA Fellers, MD;  Location: [facility] OR;  Service: Open Heart Surgery;  Laterality: N/A;   WRIST FRACTURE SURGERY Left    3 times    Current Outpatient Medications  Medication Sig Dispense Refill   acetaminophen  (TYLENOL ) 500 MG tablet Take 1,000 mg by mouth every 6 (six) hours as needed for mild pain.     aspirin  EC 81 MG tablet Take 1 tablet (81 mg total) by mouth daily. Swallow whole. 90 tablet 3   Budeson-Glycopyrrol-Formoterol (BREZTRI AEROSPHERE IN) Inhale into the lungs.     carvedilol  (COREG ) 3.125 MG tablet Take 1 tablet (3.125 mg total) by mouth 2 (two) times daily. 180 tablet 3   DULoxetine  (CYMBALTA ) 60 MG capsule Take 60 mg by mouth daily.     ezetimibe  (ZETIA ) 10 MG tablet TAKE 1 TABLET(10 MG) BY MOUTH DAILY 90 tablet 3   isosorbide  mononitrate (IMDUR ) 30 MG 24 hr tablet Take 1 tablet (30 mg total) by mouth daily. 90 tablet 1   lidocaine  (LIDODERM ) 5 % 1 patch daily. As needed     nitroGLYCERIN  (NITROSTAT ) 0.4 MG SL tablet DISSOLVE 1 TABLET UNDER THE TONGUE EVERY 5 MINUTES FOR 3 DOSES AS NEEDED FOR CHEST PAIN 25 tablet 3   pantoprazole  (PROTONIX ) 40 MG tablet TAKE 1 TABLET(40 MG) BY MOUTH TWICE DAILY 60 tablet 5   pregabalin (LYRICA) 75 MG capsule Take 75 mg by mouth 2 (two) times daily.      rosuvastatin  (CRESTOR ) 40 MG tablet Take 1 tablet (40 mg total) by mouth every evening. 90 tablet 3   traMADol  (ULTRAM ) 50 MG tablet Take 50 mg by mouth every 6 (six) hours as needed.     semaglutide-weight management (WEGOVY) 0.25 MG/0.5ML SOAJ SQ injection Inject 0.25 mg into the skin once a week. (Patient not taking: Reported on 06/01/2024)     No current facility-administered medications for this visit.    Allergies as of 06/01/2024 - Review Complete 06/01/2024  Allergen Reaction Noted   Bee venom Other (See Comments) 02/19/2016   Elastic bandages & [zinc] Hives 10/24/2018   Mango flavoring agent (non-screening) Hives 11/03/2018   Neosporin [bacitracin-polymyxin b]  04/02/2023   Adhesive [tape] Itching and Swelling 02/19/2016   Triple antibiotic [bacitracin-neomycin-polymyxin] Rash 02/19/2016    Social History   Socioeconomic History   Marital status: Widowed    Spouse name: Not on file   Number of children: 2   Years of education: Not on file   Highest education level: Not on file  Occupational History   Occupation: education officer, community  Tobacco Use   Smoking status: Former    Current packs/day: 0.00    Types: Cigarettes    Quit date: 03/21/2011    Years since quitting: 13.2   Smokeless tobacco: Never  Vaping Use   Vaping status: Never Used  Substance and Sexual Activity   Alcohol use: Yes    Comment: rare   Drug use: No   Sexual activity: Yes    Birth control/protection: Surgical    Comment: hyst  Other Topics Concern   Not on file  Social History Narrative   Not on file   Social Drivers of Health   Financial Resource Strain: Not on file  Food Insecurity: Not on file  Transportation Needs: Not on file  Physical Activity: Not on file  Stress: Not on file  Social Connections: Not on file    Review of systems General: negative for malaise,  night sweats, fever, chills, weight loss Neck: Negative for lumps, goiter, pain and significant neck  swelling Resp: Negative for cough, wheezing, dyspnea at rest CV: Negative for chest pain, leg swelling, palpitations, orthopnea GI: denies melena, nausea, vomiting, diarrhea, dysphagia, odyonophagia, early satiety or unintentional weight loss. +constipation +rectal bleeding +perianal discomfort MSK: Negative for joint pain or swelling, back pain, and muscle pain. Derm: Negative for itching or rash Psych: Denies depression, anxiety, memory loss, confusion. No homicidal or suicidal ideation.  Heme: Negative for prolonged bleeding, bruising easily, and swollen nodes. Endocrine: Negative for cold or heat intolerance, polyuria, polydipsia and goiter. Neuro: negative for tremor, gait imbalance, syncope and seizures. The remainder of the review of systems is noncontributory.  Physical Exam: BP 103/61 (BP Location: Left Arm, Patient Position: Sitting, Cuff Size: Normal)   Pulse 90   Temp 97.8 F (36.6 C) (Temporal)   Ht 5' 3 (1.6 m)   Wt 186 lb 14.4 oz (84.8 kg)   BMI 33.11 kg/m  General:   Alert and oriented. No distress noted. Pleasant and cooperative.  Head:  Normocephalic and atraumatic. Eyes:  Conjuctiva clear without scleral icterus. Mouth:  Oral mucosa pink and moist. Good dentition. No lesions. Heart: Normal rate and rhythm, s1 and s2 heart sounds present.  Lungs: Clear lung sounds in all lobes. Respirations equal and unlabored. Abdomen:  +BS, soft, non-tender and non-distended. No rebound or guarding. No HSM or masses noted. rectal: crystal sutton CMA present as witness, erythematous, excoriation around perianal area, no obvious lesions or masses, DRE with normal sphincter tone, tolerated well, no obvious masses felt on proximal DRE Derm: No palmar erythema or jaundice Msk:  Symmetrical without gross deformities. Normal posture. Extremities:  Without edema. Neurologic:  Alert and  oriented x4 Psych:  Alert and cooperative. Normal mood and affect.  Invalid input(s): 6 MONTHS    ASSESSMENT: Carolyn Gaines is a 58 y.o. female presenting today for follow up of GERD, EOE, Dysphagia, constipation, rectal bleeding, perianal candidiasis   GERD/EOE/dysphagia: doing well for the most part, no dysphagia at this time, has occasional chest discomfort if she has coffee or dairy which she mostly tries to avoid. For now, will continue with PPI BID, continue to avoid dairy.   Constipation/Rectal bleeding/Perianal candidiasis: reports more constipation recently, having some rectal bleeding as well with blood noted to be dripping into the toilet at times. She feels as though she may have hemorrhoids. On rectal exam, no obvious hemorrhoids though does appear she has some perianal candidiasis with some excoriation around perianal area. Will start nystatin BID x10 days, instructed on keeping perianal area clean and dry, avoiding harsh soaps or wet wipes. Given rectal bleeding and last TCS in 2018, would also recommend updating colonoscopy as we may be dealing with hemorrhoidal bleeding in setting of constipation but would be important to rule out other sources of bleeding in the colon such as polyps, AVMs, malignancy.    PLAN:  -continue PPI BID -continue to avoid Milk protein -consider addition of dupixent in the future if EOE not well managed on PPI therapy -schedule colonoscopy ASA II  -continue good water intake -start metamucil BID -nystatin BID x10 days -keep perianal area clean and dry -good reflux precautions   All questions were answered, patient verbalized understanding and is in agreement with plan as outlined above.    Follow Up: 4 months   Hezekiah Veltre L. Shalik Sanfilippo, MSN, APRN, AGNP-C Adult-Gerontology Nurse Practitioner [facility]

## 2024-06-01 NOTE — Telephone Encounter (Signed)
 ATC pt, no answer. LVM for call back.

## 2024-06-01 NOTE — H&P (View-Only) (Signed)
 Referring Provider: Shona Norleen PEDLAR, MD Primary Care Physician:  Shona Norleen PEDLAR, MD Primary GI Physician: Dr. Cinderella   Chief Complaint  Patient presents with   Follow-up    Patient here today for a follow up on Gerd and esophagitis. She does still have occasions of mid epigastric pain and reflux. Patient is taking pantoprazole  40 mg bid, and says this controls her symptoms.    HPI:   KEATON STIREWALT is a 58 y.o. female with past medical history of CAD status post PCI and CABG, hypertension, fibromyalgia ,anxiety depression, DVT not on anticoagulation anymore   Patient presenting today for:  Follow up of dysphagia/EOE/GERD Constipation  Rectal bleeding Perianal candidasis   Last seen July, at that time avoiding meat, milk, sometime still using products with diary. Taking PPI BID, gerd well controlled. Some left sided chest pain, upcoming stress test. Constipation well controlled with miralax  PRN.  Recommended continue PPI BID, continue to avoid milk protein in diet, try otc probiotic, high water intake, high fiber diet, miralax  PRN  CMP and CBC on 04/06/24 grossly unremarkable   Present:   States she has had a few episodes where she has had coffee or dairy and had some chest discomfort/GERD flare, maybe 1-2 times per month. She is not currently having any dysphagia. She notes some swelling or discomfort to her left neck/throat a times. She has used listerine to help cleanse her throat. She tries to avoid coffee and dairy except on certain occasions as this tends to give her some issues. She will sometimes use butter or a small amount of milk in foods but tends to do okay with this.   Constipation has been worse recently. Has had issues with hemorrhoids recently. She notes she has had to wear a pad at times due to some rectal bleeding. She notes a lot of itching and burning in her perineal area. She is using tucks pads to helps. She notes that she has had times where blood drips into the toilet,  she has some burning in rectal area as well. She notes that she often feels she overcleans herself.  bleeding has been newer over the past few months. 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 Dilated. - Normal stomach. - Normal duodenal bulb and second portion of the duodenum.  - Biopsies were taken with a cold forceps for  evaluation of eosinophilic esophagitis. -Examination appears much improved , but if                            histologically if patient has persistent                            esonophilia and patient has dysphagia would                            consider adding Dupilimumab   A. DISTAL ESOPHAGEAL BIOPSY:  - Benign squamous mucosa with mild chronic inflammation  - Negative for increased intraepithelial eosinophils  - Negative for dysplasia or malignancy   B. PROXIMAL ESOPHAGEAL BIOPSY:  - Benign squamous mucosa with mild chronic inflammation  - Negative for increased intraepithelial eosinophils  - Negative for dysplasia or malignancy    Repeat EGD PRN  Last Colonoscopy: 2018 The entire examined colon is normal on direct and retroflexion views. - No specimens collected.  Suggested repeat 10 years  Past Medical History:  Diagnosis Date   Anemia    low iron   Anxiety    Arthritis    Bleeding ulcer    age 40. spent one week in hospital with bleeding ulcers.   Coronary artery disease    a. s/p prior stenting to RCA b. CABG in 09/2016 with LIMA-LAD, SVG-OM and SVG-RCA c. low-risk NST in 01/2019   Depression    Dyspnea    Fibromyalgia    Gallstones    GERD (gastroesophageal reflux disease)    Headache    migraines   MI (myocardial infarction) (HCC) 01/2016   Peptic ulcer    Seizures (HCC)    as a baby    Past Surgical History:  Procedure Laterality Date   ABDOMINAL HYSTERECTOMY     BIOPSY  04/18/2023   Procedure:  BIOPSY;  Surgeon: Cinderella Deatrice FALCON, MD;  [facility]: [facility];  Service: Endoscopy;;   BIOPSY  07/16/2023   Procedure: BIOPSY;  Surgeon: Cinderella Deatrice FALCON, MD;  [facility]: [facility];  Service: Endoscopy;;   BREAST BIOPSY Left 11/05/2023   US  LT BREAST BX W LOC DEV 1ST LESION IMG BX SPEC US  GUIDE 11/05/2023 Lennon Nest, MD [facility]   CARDIAC CATHETERIZATION N/A 02/19/2016   Procedure: Left Heart Cath and Coronary Angiography;  Surgeon: Gordy Bergamo, MD;  Location: [facility] INVASIVE CV LAB;  Service: Cardiovascular;  Laterality: N/A;   CARDIAC CATHETERIZATION N/A 02/19/2016   Procedure: Coronary Stent Intervention;  Surgeon: Gordy Bergamo, MD;  Location: [facility] INVASIVE CV LAB;  Service: Cardiovascular;  Laterality: N/A;   CESAREAN SECTION     x 2   CHOLECYSTECTOMY N/A 01/25/2017   Procedure: LAPAROSCOPIC CHOLECYSTECTOMY;  Surgeon: Mavis Anes, MD;  Location: [facility];  Service: General;  Laterality: N/A;   COLONOSCOPY     CORONARY ARTERY BYPASS GRAFT N/A 10/26/2016   Procedure: CORONARY ARTERY BYPASS GRAFTING (CABG), ON PUMP, TIMES THREE, USING LEFT INTERNAL MAMMARY ARTERY AND ENDOSCOPICALLY HARVESTED RIGHT GREATER SAPHENOUS VEIN;  Surgeon: Dorise MARLA Fellers, MD;  Location: [facility] OR;  Service: Open Heart Surgery;  Laterality: N/A;  LIMA-LAD SVG-RCA SVG-OM   ESOPHAGOGASTRODUODENOSCOPY     ESOPHAGOGASTRODUODENOSCOPY N/A 01/22/2024   Procedure: EGD (ESOPHAGOGASTRODUODENOSCOPY);  Surgeon: Cinderella Deatrice FALCON, MD;  [facility];  Service: Endoscopy;  Laterality: N/A;  1:30pm, asa 1-2   ESOPHAGOGASTRODUODENOSCOPY (EGD) WITH PROPOFOL  N/A 04/18/2023   Procedure: ESOPHAGOGASTRODUODENOSCOPY (EGD) WITH PROPOFOL ;  Surgeon: Cinderella Deatrice FALCON, MD;  [facility];  Service: Endoscopy;  Laterality: N/A;  2:15pm;asa 3, LM to see if pt can move up   ESOPHAGOGASTRODUODENOSCOPY (EGD) WITH PROPOFOL  N/A 07/16/2023   Procedure: ESOPHAGOGASTRODUODENOSCOPY (EGD) WITH PROPOFOL ;  Surgeon: Cinderella Deatrice FALCON, MD;   [facility];  Service: Endoscopy;  Laterality: N/A;  8:15AM;ASA 3   LEFT HEART CATH AND CORONARY ANGIOGRAPHY N/A 10/22/2016   Procedure: Left Heart Cath and Coronary Angiography;  Surgeon: Debby DELENA Sor, MD;  Location: [facility]VAS[facility] CV LAB;  Service: Cardiovascular;  Laterality: N/A;   SAVORY DILATION  07/16/2023   Procedure: SAVORY DILATION;  Surgeon: Cinderella Deatrice FALCON, MD;  [facility];  Service: Endoscopy;;   STERNAL WIRES REMOVAL N/A 07/11/2017   Procedure: STERNAL WIRES REMOVAL;  Surgeon: Fellers Dorise MARLA, MD;  Location: [facility] OR;  Service: Thoracic;  Laterality: N/A;   TEE WITHOUT CARDIOVERSION N/A  10/26/2016   Procedure: TRANSESOPHAGEAL ECHOCARDIOGRAM (TEE);  Surgeon: Dorise MARLA Fellers, MD;  Location: [facility] OR;  Service: Open Heart Surgery;  Laterality: N/A;   WRIST FRACTURE SURGERY Left    3 times    Current Outpatient Medications  Medication Sig Dispense Refill   acetaminophen  (TYLENOL ) 500 MG tablet Take 1,000 mg by mouth every 6 (six) hours as needed for mild pain.     aspirin  EC 81 MG tablet Take 1 tablet (81 mg total) by mouth daily. Swallow whole. 90 tablet 3   Budeson-Glycopyrrol-Formoterol (BREZTRI AEROSPHERE IN) Inhale into the lungs.     carvedilol  (COREG ) 3.125 MG tablet Take 1 tablet (3.125 mg total) by mouth 2 (two) times daily. 180 tablet 3   DULoxetine  (CYMBALTA ) 60 MG capsule Take 60 mg by mouth daily.     ezetimibe  (ZETIA ) 10 MG tablet TAKE 1 TABLET(10 MG) BY MOUTH DAILY 90 tablet 3   isosorbide  mononitrate (IMDUR ) 30 MG 24 hr tablet Take 1 tablet (30 mg total) by mouth daily. 90 tablet 1   lidocaine  (LIDODERM ) 5 % 1 patch daily. As needed     nitroGLYCERIN  (NITROSTAT ) 0.4 MG SL tablet DISSOLVE 1 TABLET UNDER THE TONGUE EVERY 5 MINUTES FOR 3 DOSES AS NEEDED FOR CHEST PAIN 25 tablet 3   pantoprazole  (PROTONIX ) 40 MG tablet TAKE 1 TABLET(40 MG) BY MOUTH TWICE DAILY 60 tablet 5   pregabalin (LYRICA) 75 MG capsule Take 75 mg by mouth 2 (two) times daily.      rosuvastatin  (CRESTOR ) 40 MG tablet Take 1 tablet (40 mg total) by mouth every evening. 90 tablet 3   traMADol  (ULTRAM ) 50 MG tablet Take 50 mg by mouth every 6 (six) hours as needed.     semaglutide-weight management (WEGOVY) 0.25 MG/0.5ML SOAJ SQ injection Inject 0.25 mg into the skin once a week. (Patient not taking: Reported on 06/01/2024)     No current facility-administered medications for this visit.    Allergies as of 06/01/2024 - Review Complete 06/01/2024  Allergen Reaction Noted   Bee venom Other (See Comments) 02/19/2016   Elastic bandages & [zinc] Hives 10/24/2018   Mango flavoring agent (non-screening) Hives 11/03/2018   Neosporin [bacitracin-polymyxin b]  04/02/2023   Adhesive [tape] Itching and Swelling 02/19/2016   Triple antibiotic [bacitracin-neomycin-polymyxin] Rash 02/19/2016    Social History   Socioeconomic History   Marital status: Widowed    Spouse name: Not on file   Number of children: 2   Years of education: Not on file   Highest education level: Not on file  Occupational History   Occupation: education officer, community  Tobacco Use   Smoking status: Former    Current packs/day: 0.00    Types: Cigarettes    Quit date: 03/21/2011    Years since quitting: 13.2   Smokeless tobacco: Never  Vaping Use   Vaping status: Never Used  Substance and Sexual Activity   Alcohol use: Yes    Comment: rare   Drug use: No   Sexual activity: Yes    Birth control/protection: Surgical    Comment: hyst  Other Topics Concern   Not on file  Social History Narrative   Not on file   Social Drivers of Health   Financial Resource Strain: Not on file  Food Insecurity: Not on file  Transportation Needs: Not on file  Physical Activity: Not on file  Stress: Not on file  Social Connections: Not on file    Review of systems General: negative for malaise,  night sweats, fever, chills, weight loss Neck: Negative for lumps, goiter, pain and significant neck  swelling Resp: Negative for cough, wheezing, dyspnea at rest CV: Negative for chest pain, leg swelling, palpitations, orthopnea GI: denies melena, nausea, vomiting, diarrhea, dysphagia, odyonophagia, early satiety or unintentional weight loss. +constipation +rectal bleeding +perianal discomfort MSK: Negative for joint pain or swelling, back pain, and muscle pain. Derm: Negative for itching or rash Psych: Denies depression, anxiety, memory loss, confusion. No homicidal or suicidal ideation.  Heme: Negative for prolonged bleeding, bruising easily, and swollen nodes. Endocrine: Negative for cold or heat intolerance, polyuria, polydipsia and goiter. Neuro: negative for tremor, gait imbalance, syncope and seizures. The remainder of the review of systems is noncontributory.  Physical Exam: BP 103/61 (BP Location: Left Arm, Patient Position: Sitting, Cuff Size: Normal)   Pulse 90   Temp 97.8 F (36.6 C) (Temporal)   Ht 5' 3 (1.6 m)   Wt 186 lb 14.4 oz (84.8 kg)   BMI 33.11 kg/m  General:   Alert and oriented. No distress noted. Pleasant and cooperative.  Head:  Normocephalic and atraumatic. Eyes:  Conjuctiva clear without scleral icterus. Mouth:  Oral mucosa pink and moist. Good dentition. No lesions. Heart: Normal rate and rhythm, s1 and s2 heart sounds present.  Lungs: Clear lung sounds in all lobes. Respirations equal and unlabored. Abdomen:  +BS, soft, non-tender and non-distended. No rebound or guarding. No HSM or masses noted. rectal: crystal sutton CMA present as witness, erythematous, excoriation around perianal area, no obvious lesions or masses, DRE with normal sphincter tone, tolerated well, no obvious masses felt on proximal DRE Derm: No palmar erythema or jaundice Msk:  Symmetrical without gross deformities. Normal posture. Extremities:  Without edema. Neurologic:  Alert and  oriented x4 Psych:  Alert and cooperative. Normal mood and affect.  Invalid input(s): 6 MONTHS    ASSESSMENT: Carolyn Gaines is a 58 y.o. female presenting today for follow up of GERD, EOE, Dysphagia, constipation, rectal bleeding, perianal candidiasis   GERD/EOE/dysphagia: doing well for the most part, no dysphagia at this time, has occasional chest discomfort if she has coffee or dairy which she mostly tries to avoid. For now, will continue with PPI BID, continue to avoid dairy.   Constipation/Rectal bleeding/Perianal candidiasis: reports more constipation recently, having some rectal bleeding as well with blood noted to be dripping into the toilet at times. She feels as though she may have hemorrhoids. On rectal exam, no obvious hemorrhoids though does appear she has some perianal candidiasis with some excoriation around perianal area. Will start nystatin BID x10 days, instructed on keeping perianal area clean and dry, avoiding harsh soaps or wet wipes. Given rectal bleeding and last TCS in 2018, would also recommend updating colonoscopy as we may be dealing with hemorrhoidal bleeding in setting of constipation but would be important to rule out other sources of bleeding in the colon such as polyps, AVMs, malignancy.    PLAN:  -continue PPI BID -continue to avoid Milk protein -consider addition of dupixent in the future if EOE not well managed on PPI therapy -schedule colonoscopy ASA II  -continue good water intake -start metamucil BID -nystatin BID x10 days -keep perianal area clean and dry -good reflux precautions   All questions were answered, patient verbalized understanding and is in agreement with plan as outlined above.    Follow Up: 4 months   Lanitra Battaglini L. Chameka Mcmullen, MSN, APRN, AGNP-C Adult-Gerontology Nurse Practitioner [facility]

## 2024-06-01 NOTE — Patient Instructions (Addendum)
-  continue protonix  40mg   twice daily  -continue to avoid Milk protein -schedule colonoscopy -continue good water intake -start metamucil twice daily -nystatin twice daily to rectal area x10 days, keep area clean and dry, can use non talc powder to help with this  -good reflux precautions   Follow up 4 months  It was a pleasure to see you today. I want to create trusting relationships with patients and provide genuine, compassionate, and quality care. I truly value your feedback! please be on the lookout for a survey regarding your visit with me today. I appreciate your input about our visit and your time in completing this!    Tyshawn Ciullo L. Mordche Hedglin, MSN, APRN, AGNP-C Adult-Gerontology Nurse Practitioner [facility] at [facility]

## 2024-06-02 DIAGNOSIS — K219 Gastro-esophageal reflux disease without esophagitis: Secondary | ICD-10-CM | POA: Insufficient documentation

## 2024-06-04 MED ORDER — PEG 3350-KCL-NA BICARB-NACL 420 G PO SOLR: 4000.0000 mL | Freq: Once | ORAL | 0 refills | Status: AC

## 2024-06-04 NOTE — Telephone Encounter (Signed)
 Spoke with pt. She has been scheduled for 11/11. Aware will send instructions to her mychart and prep rx to the pharmacy. She is currently not on the wegovy

## 2024-06-04 NOTE — Addendum Note (Signed)
 Addended by: JEANELL GRAEME RAMAN on: 06/04/2024 08:22 AM   Modules accepted: Orders

## 2024-06-05 ENCOUNTER — Encounter (HOSPITAL_COMMUNITY): Payer: Self-pay

## 2024-06-05 ENCOUNTER — Encounter (HOSPITAL_COMMUNITY)
Admission: RE | Admit: 2024-06-05 | Discharge: 2024-06-05 | Disposition: A | Discharge status: Home / Self Care | Source: Ambulatory Visit | Attending: Gastroenterology | Admitting: Gastroenterology

## 2024-06-05 ENCOUNTER — Other Ambulatory Visit: Payer: Self-pay

## 2024-06-09 ENCOUNTER — Ambulatory Visit (HOSPITAL_BASED_OUTPATIENT_CLINIC_OR_DEPARTMENT_OTHER): Payer: Self-pay | Admitting: Anesthesiology

## 2024-06-09 ENCOUNTER — Ambulatory Visit (HOSPITAL_COMMUNITY)
Admission: RE | Admit: 2024-06-09 | Discharge: 2024-06-09 | Disposition: A | Discharge status: Home / Self Care | Attending: Gastroenterology | Admitting: Gastroenterology

## 2024-06-09 ENCOUNTER — Encounter (HOSPITAL_COMMUNITY)
Admission: RE | Disposition: A | Discharge status: Home / Self Care | Payer: Self-pay | Source: Home / Self Care | Attending: Gastroenterology

## 2024-06-09 ENCOUNTER — Ambulatory Visit (HOSPITAL_COMMUNITY): Payer: Self-pay | Admitting: Anesthesiology

## 2024-06-09 DIAGNOSIS — K640 First degree hemorrhoids: Secondary | ICD-10-CM

## 2024-06-09 DIAGNOSIS — Z87891 Personal history of nicotine dependence: Secondary | ICD-10-CM | POA: Insufficient documentation

## 2024-06-09 DIAGNOSIS — I10 Essential (primary) hypertension: Secondary | ICD-10-CM | POA: Insufficient documentation

## 2024-06-09 DIAGNOSIS — R6889 Other general symptoms and signs: Secondary | ICD-10-CM | POA: Diagnosis not present

## 2024-06-09 DIAGNOSIS — K649 Unspecified hemorrhoids: Secondary | ICD-10-CM

## 2024-06-09 DIAGNOSIS — R1310 Dysphagia, unspecified: Secondary | ICD-10-CM | POA: Diagnosis not present

## 2024-06-09 DIAGNOSIS — K625 Hemorrhage of anus and rectum: Secondary | ICD-10-CM | POA: Diagnosis not present

## 2024-06-09 DIAGNOSIS — K2100 Gastro-esophageal reflux disease with esophagitis, without bleeding: Secondary | ICD-10-CM | POA: Insufficient documentation

## 2024-06-09 DIAGNOSIS — I2510 Atherosclerotic heart disease of native coronary artery without angina pectoris: Secondary | ICD-10-CM | POA: Diagnosis not present

## 2024-06-09 DIAGNOSIS — K921 Melena: Secondary | ICD-10-CM | POA: Diagnosis present

## 2024-06-09 DIAGNOSIS — K648 Other hemorrhoids: Secondary | ICD-10-CM | POA: Insufficient documentation

## 2024-06-09 DIAGNOSIS — F32A Depression, unspecified: Secondary | ICD-10-CM | POA: Insufficient documentation

## 2024-06-09 DIAGNOSIS — F419 Anxiety disorder, unspecified: Secondary | ICD-10-CM | POA: Insufficient documentation

## 2024-06-09 DIAGNOSIS — I25119 Atherosclerotic heart disease of native coronary artery with unspecified angina pectoris: Secondary | ICD-10-CM

## 2024-06-09 DIAGNOSIS — Z79899 Other long term (current) drug therapy: Secondary | ICD-10-CM | POA: Insufficient documentation

## 2024-06-09 DIAGNOSIS — I252 Old myocardial infarction: Secondary | ICD-10-CM | POA: Diagnosis not present

## 2024-06-09 HISTORY — PX: COLONOSCOPY: SHX5424

## 2024-06-09 LAB — HM COLONOSCOPY

## 2024-06-09 SURGERY — COLONOSCOPY
Anesthesia: Monitor Anesthesia Care

## 2024-06-09 MED ORDER — PROPOFOL 500 MG/50ML IV EMUL
INTRAVENOUS | Status: DC | PRN
  Administered 2024-06-09: 125 ug/kg/min via INTRAVENOUS

## 2024-06-09 MED ORDER — PROPOFOL 10 MG/ML IV BOLUS
INTRAVENOUS | Status: DC | PRN
  Administered 2024-06-09 (×2): 50 mg via INTRAVENOUS

## 2024-06-09 MED ORDER — LACTATED RINGERS IV SOLN: INTRAVENOUS | Status: DC | PRN

## 2024-06-09 NOTE — Interval H&P Note (Signed)
 History and Physical Interval Note:  06/09/2024 11:41 AM  Carolyn Gaines  has presented today for surgery, with the diagnosis of RECTAL BLEEDING.  The various methods of treatment have been discussed with the patient and family. After consideration of risks, benefits and other options for treatment, the patient has consented to  Procedure(s) with comments: COLONOSCOPY (N/A) - 1:45PM, ASA 2 as a surgical intervention.  [name] history has been reviewed, patient examined, no change in status, stable for surgery.  I have reviewed the patient's chart and labs.  Questions were answered to the patient's satisfaction.     Deatrice FALCON Karl Knarr

## 2024-06-09 NOTE — Transfer of Care (Signed)
 Immediate Anesthesia Transfer of Care Note  Patient: Carolyn Gaines  Procedure(s) Performed: COLONOSCOPY  Patient Location: PACU  Anesthesia Type:MAC  Level of Consciousness: awake and alert   Airway & Oxygen  Therapy: Patient Spontanous Breathing and Patient connected to nasal cannula oxygen   Post-op Assessment: Report given to RN and Post -op Vital signs reviewed and stable  Post vital signs: Reviewed and stable  Last Vitals:  Vitals Value Taken Time  BP 92/49 06/09/24 12:50  Temp 36.7 C 06/09/24 12:50  Pulse 81 06/09/24 12:50  Resp 15 06/09/24 12:50  SpO2 95 % 06/09/24 12:50    Last Pain:  Vitals:   06/09/24 1250  TempSrc: Axillary  PainSc:       Patients Stated Pain Goal: 6 (06/09/24 1159)  Complications: No notable events documented.

## 2024-06-09 NOTE — Anesthesia Preprocedure Evaluation (Signed)
 Anesthesia Evaluation  Patient identified by MRN, date of birth, ID band Patient awake    Reviewed: Allergy & Precautions, H&P , NPO status , Patient's Chart, lab work & pertinent test results, reviewed documented beta blocker date and time   Airway Mallampati: II  TM Distance: >3 FB Neck ROM: full    Dental no notable dental hx.    Pulmonary neg pulmonary ROS, shortness of breath, former smoker   Pulmonary exam normal breath sounds clear to auscultation       Cardiovascular Exercise Tolerance: Good hypertension, + angina  + CAD and + Past MI  negative cardio ROS  Rhythm:regular Rate:Normal     Neuro/Psych  Headaches, Seizures -,  PSYCHIATRIC DISORDERS Anxiety Depression     Neuromuscular disease negative neurological ROS  negative psych ROS   GI/Hepatic negative GI ROS, Neg liver ROS, PUD,GERD  ,,  Endo/Other  negative endocrine ROS    Renal/GU negative Renal ROS  negative genitourinary   Musculoskeletal   Abdominal   Peds  Hematology negative hematology ROS (+) Blood dyscrasia, anemia   Anesthesia Other Findings   Reproductive/Obstetrics negative OB ROS                              Anesthesia Physical Anesthesia Plan  ASA: 3  Anesthesia Plan: MAC   Post-op Pain Management:    Induction:   PONV Risk Score and Plan: Propofol  infusion  Airway Management Planned:   Additional Equipment:   Intra-op Plan:   Post-operative Plan:   Informed Consent: I have reviewed the patients History and Physical, chart, labs and discussed the procedure including the risks, benefits and alternatives for the proposed anesthesia with the patient or authorized representative who has indicated his/her understanding and acceptance.     Dental Advisory Given  Plan Discussed with: CRNA  Anesthesia Plan Comments:         Anesthesia Quick Evaluation

## 2024-06-09 NOTE — Discharge Instructions (Signed)

## 2024-06-09 NOTE — Op Note (Signed)
 [facility] Patient Name: Carolyn Gaines Procedure Date: 06/09/2024 12:06 PM MRN: [id number] Date of Birth: 06-Dec-1965 Attending MD: Deatrice Dine , MD, [id number] CSN: [id number] Age: 58 Admit Type: Outpatient Procedure:                Colonoscopy Indications:              Hematochezia Providers:                Deatrice Dine, MD, Madelin Hunter, RN, Dorcas Lenis, Technician Referring MD:              Medicines:                Monitored Anesthesia Care Complications:            No immediate complications. Estimated Blood Loss:     Estimated blood loss was minimal. Procedure:                Pre-Anesthesia Assessment:                           - Prior to the procedure, a History and Physical                            was performed, and patient medications and                            allergies were reviewed. [name] tolerance of                            previous anesthesia was also reviewed. The risks                            and benefits of the procedure and the sedation                            options and risks were discussed with the patient.                            All questions were answered, and informed consent                            was obtained. Prior Anticoagulants: The patient has                            taken no anticoagulant or antiplatelet agents. ASA                            Grade Assessment: II - A patient with mild systemic                            disease. After reviewing the risks and benefits,  the patient was deemed in satisfactory condition to                            undergo the procedure.                           After obtaining informed consent, the colonoscope                            was passed under direct vision. Throughout the                            procedure, the patient's blood pressure, pulse, and                            oxygen  saturations were monitored  continuously. The                            [id number]-[id number] ([id number]) Peds Colon was introduced                            through the anus and advanced to the the terminal                            ileum. The terminal ileum, ileocecal valve,                            appendiceal orifice, and rectum were photographed. Scope In: 12:31:08 PM Scope Out: 12:45:18 PM Scope Withdrawal Time: 0 hours 13 minutes 35 seconds  Total Procedure Duration: 0 hours 14 minutes 10 seconds  Findings:      There is no endoscopic evidence of bleeding or inflammation in the       entire colon. Biopsies for histology were taken with a cold forceps for       evaluation of microscopic colitis.      Non-bleeding internal hemorrhoids were found during retroflexion. The       hemorrhoids were small.      The terminal ileum appeared normal. Impression:               - Non-bleeding internal hemorrhoids.                           - The examined portion of the ileum was normal. Moderate Sedation:      Per Anesthesia Care Recommendation:           - Patient has a contact number available for                            emergencies. The signs and symptoms of potential                            delayed complications were discussed with the                            patient. Return to normal activities tomorrow.  Written discharge instructions were provided to the                            patient.                           - Resume previous diet.                           - Continue present medications.                           - Await pathology results.                           - Repeat colonoscopy in 10 years for screening                            purposes.                           - Return to GI office as previously scheduled. Procedure Code(s):        --- Professional ---                           [id number], Colonoscopy, flexible; with biopsy, single                            or  multiple Diagnosis Code(s):        --- Professional ---                           K64.8, Other hemorrhoids                           K92.1, Melena (includes Hematochezia) CPT copyright 2022 American Medical Association. All rights reserved. The codes documented in this report are preliminary and upon coder review may  be revised to meet current compliance requirements. Deatrice Dine, MD Deatrice Dine, MD 06/09/2024 12:52:08 PM This report has been signed electronically. Number of Addenda: 0

## 2024-06-10 ENCOUNTER — Encounter (HOSPITAL_COMMUNITY): Payer: Self-pay | Admitting: Gastroenterology

## 2024-06-10 ENCOUNTER — Encounter (INDEPENDENT_AMBULATORY_CARE_PROVIDER_SITE_OTHER): Payer: Self-pay | Admitting: *Deleted

## 2024-06-10 LAB — SURGICAL PATHOLOGY

## 2024-06-12 ENCOUNTER — Ambulatory Visit (INDEPENDENT_AMBULATORY_CARE_PROVIDER_SITE_OTHER): Payer: Self-pay | Admitting: Gastroenterology

## 2024-06-12 NOTE — Anesthesia Postprocedure Evaluation (Signed)
 Anesthesia Post Note  Patient: Carolyn Gaines  Procedure(s) Performed: COLONOSCOPY  Patient location during evaluation: Phase II Anesthesia Type: MAC Level of consciousness: awake Pain management: pain level controlled Vital Signs Assessment: post-procedure vital signs reviewed and stable Respiratory status: spontaneous breathing and respiratory function stable Cardiovascular status: blood pressure returned to baseline and stable Postop Assessment: no headache and no apparent nausea or vomiting Anesthetic complications: no Comments: Late entry   No notable events documented.   Last Vitals:  Vitals:   06/09/24 1255 06/09/24 1302  BP: (!) 93/48 99/62  Pulse: 82 78  Resp: 13 13  Temp:    SpO2: 98% 98%    Last Pain:  Vitals:   06/09/24 1302  TempSrc:   PainSc: 0-No pain                 [name]

## 2024-06-18 NOTE — Progress Notes (Signed)
 10 yr TCS noted in recall Patient result letter mailed procedure note and pathology result faxed to PCP

## 2024-08-12 ENCOUNTER — Ambulatory Visit: Attending: Cardiology | Admitting: Cardiology

## 2024-08-12 ENCOUNTER — Encounter: Payer: Self-pay | Admitting: Cardiology

## 2024-08-12 VITALS — BP 118/82 | HR 75 | Ht 63.0 in | Wt 183.8 lb

## 2024-08-12 DIAGNOSIS — I25119 Atherosclerotic heart disease of native coronary artery with unspecified angina pectoris: Secondary | ICD-10-CM | POA: Diagnosis not present

## 2024-08-12 DIAGNOSIS — E782 Mixed hyperlipidemia: Secondary | ICD-10-CM | POA: Diagnosis not present

## 2024-08-12 DIAGNOSIS — I6523 Occlusion and stenosis of bilateral carotid arteries: Secondary | ICD-10-CM | POA: Diagnosis not present

## 2024-08-12 NOTE — Progress Notes (Signed)
 "     Clinical Summary Carolyn Gaines is a 59 y.o.female seen today for follow up of the following medical problems.    1. 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 Residual LAD disease 60-70% managed medically. - From Dr Godfrey notes nuclear stress 03/2016 (after stent) with mild septal ischemia, intermediate risk. - 09/2016 cath with 90% ostial LM. S/p cabg 10/16/16 wth LIMA-LAD, SVG-OM, SVG-RCA - 09/2016 echo LVEF 55-60%, no WMAs  - Has not been on ACE-I due to soft bp's     history of chronic chest wall pain since CABG, Did not tolerate gabapentin . Did have sternal wires removed 07/11/2017 with some improvement   01/2019 nuclear stress no ischemia   normal Lexiscan  Myoview  11/10/2020    - DOE started about 3 months ago. For example walking up 1 flight of stairs, mild chest discomfort. Pins/needles like feeling which she uses lidocaine  patch - some recent LE edema. Mild orthopnea - 02/2022 LVEF 60-65%, normal diastolic function, normal RV - knee surgery about 1 year ago, limited activities.  - weight is up 23 lbs since last year.       05/2023 echo: LVEF 55-60%, normal diastolic, normal RV function  - isolated episode of chest pain, imdur  was increased to 30mg  daily - some left chest soreness, can occur at rest or with activity. Not positional. Lasts just a second or 2.  - working on weight loss, pcp just started [employer].     12/2023 PFTs: mod obstructive disease   - denies any chest pains, breathing up and down - started on inhaler pcp just this past Monday.  - has been on wegovy, some difficulty getting.    2. Hyperlipidemia - had been off statin for some time, now just restarted a few weeks ago - 01/2023 TC171 TG 153 HDL 46 LDL 98 - she is on crestor  40, zetia  10. not interested in pcsk9i   - 03/2024 TC HDL 33 LDL 156 TG 205  - Jan 2026 TC 191 TG 106 HDL 39 LDL 133     3. History of DVT - diagnosed 12/2020 - managed by pcp       4. Carotid US  2018 RICA  1-39% - 04/2023 minimal plaque   SH: husband recently passed away, had been ill for long time. She has started hiking, doing things on her bucket list  Works at pharmacy. Past Medical History:  Diagnosis Date   Anemia    low iron   Anxiety    Arthritis    Bleeding ulcer    age 74. spent one week in hospital with bleeding ulcers.   Coronary artery disease    a. s/p prior stenting to RCA b. CABG in 09/2016 with LIMA-LAD, SVG-OM and SVG-RCA c. low-risk NST in 01/2019   Depression    Dyspnea    Fibromyalgia    Gallstones    GERD (gastroesophageal reflux disease)    Headache    migraines   MI (myocardial infarction) (HCC) 01/2016   Peptic ulcer    Seizures (HCC)    as a baby- was on meds as child but none since childhood     Allergies[1]   Current Outpatient Medications  Medication Sig Dispense Refill   acetaminophen  (TYLENOL ) 500 MG tablet Take 1,000 mg by mouth every 6 (six) hours as needed for mild pain.     aspirin  EC 81 MG tablet Take 1 tablet (81 mg total) by mouth daily. Swallow whole. 90 tablet 3  Budeson-Glycopyrrol-Formoterol (BREZTRI AEROSPHERE IN) Inhale into the lungs.     carvedilol  (COREG ) 3.125 MG tablet Take 1 tablet (3.125 mg total) by mouth 2 (two) times daily. 180 tablet 3   DULoxetine  (CYMBALTA ) 60 MG capsule Take 60 mg by mouth daily.     ezetimibe  (ZETIA ) 10 MG tablet TAKE 1 TABLET(10 MG) BY MOUTH DAILY 90 tablet 3   isosorbide  mononitrate (IMDUR ) 30 MG 24 hr tablet Take 1 tablet (30 mg total) by mouth daily. 90 tablet 1   lidocaine  (LIDODERM ) 5 % 1 patch daily. As needed     nitroGLYCERIN  (NITROSTAT ) 0.4 MG SL tablet DISSOLVE 1 TABLET UNDER THE TONGUE EVERY 5 MINUTES FOR 3 DOSES AS NEEDED FOR CHEST PAIN 25 tablet 3   nystatin  ointment (MYCOSTATIN ) Apply 1 Application topically 2 (two) times daily. 30 g 1   pantoprazole  (PROTONIX ) 40 MG tablet TAKE 1 TABLET(40 MG) BY MOUTH TWICE DAILY 60 tablet 5   pregabalin (LYRICA) 75 MG capsule Take 75 mg by mouth  2 (two) times daily.     rosuvastatin  (CRESTOR ) 40 MG tablet Take 1 tablet (40 mg total) by mouth every evening. 90 tablet 3   semaglutide-weight management (WEGOVY) 0.25 MG/0.5ML SOAJ SQ injection Inject 0.25 mg into the skin once a week. (Patient not taking: Reported on 06/01/2024)     traMADol  (ULTRAM ) 50 MG tablet Take 50 mg by mouth every 6 (six) hours as needed.     No current facility-administered medications for this visit.     Past Surgical History:  Procedure Laterality Date   ABDOMINAL HYSTERECTOMY     BIOPSY  04/18/2023   Procedure: BIOPSY;  Surgeon: Cinderella Deatrice FALCON, MD;  Location: [facility];  Service: Endoscopy;;   BIOPSY  07/16/2023   Procedure: BIOPSY;  Surgeon: Cinderella Deatrice FALCON, MD;  Location: [facility];  Service: Endoscopy;;   BREAST BIOPSY Left 11/05/2023   US  LT BREAST BX W LOC DEV 1ST LESION IMG BX SPEC US  GUIDE 11/05/2023 Lennon Nest, MD [facility]   CARDIAC CATHETERIZATION N/A 02/19/2016   Procedure: Left Heart Cath and Coronary Angiography;  Surgeon: Gordy Bergamo, MD;  Location: [facility] INVASIVE CV LAB;  Service: Cardiovascular;  Laterality: N/A;   CARDIAC CATHETERIZATION N/A 02/19/2016   Procedure: Coronary Stent Intervention;  Surgeon: Gordy Bergamo, MD;  Location: [facility]VAS[facility] CV LAB;  Service: Cardiovascular;  Laterality: N/A;   CESAREAN SECTION     x 2   CHOLECYSTECTOMY N/A 01/25/2017   Procedure: LAPAROSCOPIC CHOLECYSTECTOMY;  Surgeon: Mavis Anes, MD;  Location: [facility];  Service: General;  Laterality: N/A;   COLONOSCOPY     COLONOSCOPY N/A 06/09/2024   Procedure: COLONOSCOPY;  Surgeon: Cinderella Deatrice FALCON, MD;  Location: [facility];  Service: Endoscopy;  Laterality: N/A;  1:45PM, ASA 2   CORONARY ARTERY BYPASS GRAFT N/A 10/26/2016   Procedure: CORONARY ARTERY BYPASS GRAFTING (CABG), ON PUMP, TIMES THREE, USING LEFT INTERNAL MAMMARY ARTERY AND ENDOSCOPICALLY HARVESTED RIGHT GREATER SAPHENOUS VEIN;  Surgeon: Dorise MARLA Fellers, MD;  Location: [facility];  Service: Open  Heart Surgery;  Laterality: N/A;  LIMA-LAD SVG-RCA SVG-OM   ESOPHAGOGASTRODUODENOSCOPY     ESOPHAGOGASTRODUODENOSCOPY N/A 01/22/2024   Procedure: EGD (ESOPHAGOGASTRODUODENOSCOPY);  Surgeon: Cinderella Deatrice FALCON, MD;  [facility]: [facility];  Service: Endoscopy;  Laterality: N/A;  1:30pm, asa 1-2   ESOPHAGOGASTRODUODENOSCOPY (EGD) WITH PROPOFOL  N/A 04/18/2023   Procedure: ESOPHAGOGASTRODUODENOSCOPY (EGD) WITH PROPOFOL ;  Surgeon: Cinderella Deatrice FALCON, MD;  [facility]: [facility];  Service: Endoscopy;  Laterality: N/A;  2:15pm;asa 3, LM to see if pt can move up   ESOPHAGOGASTRODUODENOSCOPY (EGD) WITH PROPOFOL  N/A 07/16/2023   Procedure: ESOPHAGOGASTRODUODENOSCOPY (EGD) WITH PROPOFOL ;  Surgeon: Cinderella Deatrice FALCON, MD;  [facility]: [facility];  Service: Endoscopy;  Laterality: N/A;  8:15AM;ASA 3   LEFT HEART CATH AND CORONARY ANGIOGRAPHY N/A 10/22/2016   Procedure: Left Heart Cath and Coronary Angiography;  Surgeon: Debby DELENA Sor, MD;  Location: [facility]VAS[facility] CV LAB;  Service: Cardiovascular;  Laterality: N/A;   SAVORY DILATION  07/16/2023   Procedure: SAVORY DILATION;  Surgeon: Cinderella Deatrice FALCON, MD;  Location: [facility];  Service: Endoscopy;;   STERNAL WIRES REMOVAL N/A 07/11/2017   Procedure: STERNAL WIRES REMOVAL;  Surgeon: Lucas Dorise POUR, MD;  Location: [facility];  Service: Thoracic;  Laterality: N/A;   TEE WITHOUT CARDIOVERSION N/A 10/26/2016   Procedure: TRANSESOPHAGEAL ECHOCARDIOGRAM (TEE);  Surgeon: Dorise POUR Lucas, MD;  Location: [facility] OR;  Service: Open Heart Surgery;  Laterality: N/A;   WRIST FRACTURE SURGERY Left    3 times     Allergies[2]    Family History  Problem Relation Age of Onset   COPD Mother    Arthritis Mother    Heart disease Mother    Stroke Mother    Psoriasis Father    Heart disease Father    Breast cancer Daughter    Diabetes Maternal Aunt    Diabetes Maternal Aunt    Diabetes Maternal Grandmother      Social History Ms. Olivero reports that she quit smoking  about 13 years ago. Her smoking use included cigarettes. She has never used smokeless tobacco. Ms. Patchen reports current alcohol use.    Physical Examination Today's Vitals   08/12/24 0939  BP: 118/82  Pulse: 75  SpO2: 96%  Weight: 183 lb 12.8 oz (83.4 kg)  Height: 5' 3 (1.6 m)   Body mass index is 32.56 kg/m.  Gen: resting comfortably, no acute distress HEENT: no scleral icterus, pupils equal round and reactive, no palptable cervical adenopathy,  CV: RRR, no m/rg, no jvd Resp: Clear to auscultation bilaterally GI: abdomen is soft, non-tender, non-distended, normal bowel sounds, no hepatosplenomegaly MSK: extremities are warm, no edema.  Skin: warm, no rash Neuro:  no focal deficits Psych: appropriate affect   Diagnostic Studies  12/2018 echo IMPRESSIONS     1. The left ventricle has normal systolic function with an ejection  fraction of 60-65%. The cavity size was normal. Left ventricular diastolic  parameters were normal.   2. The right ventricle has normal systolic function. The cavity was  normal. There is no increase in right ventricular wall thickness.   3. Mild thickening of the mitral valve leaflet.   4. The tricuspid valve is grossly normal.   5. The aortic valve is tricuspid. Moderate thickening of the aortic  valve. Sclerosis without any evidence of stenosis of the aortic valve.      01/2019 nuclear stress There was no ST segment deviation noted during stress. The study is normal. There are no perfusion defects consistent with prior infarct or current ischemia. This is a low risk study. The left ventricular ejection fraction is hyperdynamic (>65%).   05/2023 echo 1. Left ventricular ejection fraction, by estimation, is 55 to 60%. The  left ventricle has normal function. The left ventricle has no regional  wall motion abnormalities. Left ventricular diastolic parameters were  normal.   2. Right ventricular systolic function is normal. The right  ventricular  size is normal. There  is normal pulmonary artery systolic pressure.   3. The mitral valve is normal in structure. No evidence of mitral valve  regurgitation. No evidence of mitral stenosis.   4. The aortic valve is tricuspid. There is mild calcification of the  aortic valve. There is mild thickening of the aortic valve. Aortic valve  regurgitation is not visualized. Aortic valve sclerosis is present, with  no evidence of aortic valve stenosis.   5. The inferior vena cava is normal in size with greater than 50%  respiratory variability, suggesting right atrial pressure of 3 mmHg.    Assessment and Plan   1. CAD/Chest pain - history of chronic chest wall pain since CABG, Did not tolerate gabapentin . Did have sternal wires removed 07/11/2017 with some improvement -no recent chest pains - chronic SOB/DOE related to COPD, weight gain. PCP working on both, starting breztri and working to get [employer]. Cardiac testing was benign as far as contributing - continue current meds   2. Carotid stenosis - minimal plaque - continue to monitor   3. Hyperlipidemia - LDL above goal of <55, she is on max oral regimen with crestor  and zetia  and currently is not intersted in pcsk9i -after some additonal talks today seems somewhat more receptive to repatha but favors giving some additional time, f/u 4 months reassess at that time.      Dorn PHEBE Ross, M.D.     [1]  Allergies Allergen Reactions   Bee Venom Other (See Comments)    Pus sites at injection   Elastic Bandages & [Zinc] Hives   Mango Flavoring Agent (Non-Screening) Hives    All mango   Neosporin [Bacitracin-Polymyxin B]     Pt states she can use this medication    Adhesive [Tape] Itching and Swelling    Please use paper tape   Triple Antibiotic [Bacitracin-Neomycin-Polymyxin] Rash  [2]  Allergies Allergen Reactions   Bee Venom Other (See Comments)    Pus sites at injection   Elastic Bandages & [Zinc] Hives    Mango Flavoring Agent (Non-Screening) Hives    All mango   Neosporin [Bacitracin-Polymyxin B]     Pt states she can use this medication    Adhesive [Tape] Itching and Swelling    Please use paper tape   Triple Antibiotic [Bacitracin-Neomycin-Polymyxin] Rash   "

## 2024-08-12 NOTE — Patient Instructions (Signed)
Medication Instructions:  Continue all current medications.  Labwork: none  Testing/Procedures: none  Follow-Up: 4 months   Any Other Special Instructions Will Be Listed Below (If Applicable).  If you need a refill on your cardiac medications before your next appointment, please call your pharmacy.\ 

## 2024-08-31 ENCOUNTER — Other Ambulatory Visit: Payer: Self-pay | Admitting: Physician Assistant

## 2024-09-29 ENCOUNTER — Ambulatory Visit (INDEPENDENT_AMBULATORY_CARE_PROVIDER_SITE_OTHER): Admitting: Gastroenterology

## 2024-12-10 ENCOUNTER — Ambulatory Visit: Admitting: Cardiology
